# Patient Record
Sex: Male | Born: 1935 | Race: Black or African American | Hispanic: No | Marital: Married | Smoking: Former smoker
Health system: Southern US, Community
[De-identification: ages and names within clinical notes are randomized; demographics above are authoritative.]

## PROBLEM LIST (undated history)

## (undated) DIAGNOSIS — C801 Malignant (primary) neoplasm, unspecified: Secondary | ICD-10-CM

## (undated) DIAGNOSIS — I509 Heart failure, unspecified: Secondary | ICD-10-CM

## (undated) DIAGNOSIS — N289 Disorder of kidney and ureter, unspecified: Secondary | ICD-10-CM

## (undated) DIAGNOSIS — I10 Essential (primary) hypertension: Secondary | ICD-10-CM

## (undated) DIAGNOSIS — J449 Chronic obstructive pulmonary disease, unspecified: Secondary | ICD-10-CM

## (undated) DIAGNOSIS — E119 Type 2 diabetes mellitus without complications: Secondary | ICD-10-CM

## (undated) HISTORY — PX: BACK SURGERY: SHX140

## (undated) HISTORY — PX: TRACHELECTOMY: SHX6586

## (undated) HISTORY — PX: TOTAL LARYNGECTOMY: SHX2543

---

## 2011-07-22 DIAGNOSIS — F17200 Nicotine dependence, unspecified, uncomplicated: Secondary | ICD-10-CM | POA: Insufficient documentation

## 2011-12-14 DIAGNOSIS — G4752 REM sleep behavior disorder: Secondary | ICD-10-CM | POA: Insufficient documentation

## 2011-12-14 DIAGNOSIS — G4730 Sleep apnea, unspecified: Secondary | ICD-10-CM | POA: Insufficient documentation

## 2013-02-10 ENCOUNTER — Emergency Department: Payer: Self-pay | Admitting: Internal Medicine

## 2014-11-28 DIAGNOSIS — J3800 Paralysis of vocal cords and larynx, unspecified: Secondary | ICD-10-CM | POA: Insufficient documentation

## 2014-11-28 DIAGNOSIS — J392 Other diseases of pharynx: Secondary | ICD-10-CM | POA: Insufficient documentation

## 2014-12-10 DIAGNOSIS — C12 Malignant neoplasm of pyriform sinus: Secondary | ICD-10-CM | POA: Insufficient documentation

## 2015-01-07 DIAGNOSIS — D62 Acute posthemorrhagic anemia: Secondary | ICD-10-CM | POA: Insufficient documentation

## 2015-02-12 DIAGNOSIS — Z8639 Personal history of other endocrine, nutritional and metabolic disease: Secondary | ICD-10-CM | POA: Insufficient documentation

## 2015-10-29 DIAGNOSIS — C139 Malignant neoplasm of hypopharynx, unspecified: Secondary | ICD-10-CM | POA: Insufficient documentation

## 2016-02-09 DIAGNOSIS — I2510 Atherosclerotic heart disease of native coronary artery without angina pectoris: Secondary | ICD-10-CM | POA: Insufficient documentation

## 2016-02-09 DIAGNOSIS — I5030 Unspecified diastolic (congestive) heart failure: Secondary | ICD-10-CM | POA: Insufficient documentation

## 2016-04-05 DIAGNOSIS — E8770 Fluid overload, unspecified: Secondary | ICD-10-CM | POA: Insufficient documentation

## 2016-04-05 DIAGNOSIS — N184 Chronic kidney disease, stage 4 (severe): Secondary | ICD-10-CM | POA: Insufficient documentation

## 2016-04-05 DIAGNOSIS — N2581 Secondary hyperparathyroidism of renal origin: Secondary | ICD-10-CM | POA: Insufficient documentation

## 2016-06-28 DIAGNOSIS — E559 Vitamin D deficiency, unspecified: Secondary | ICD-10-CM | POA: Insufficient documentation

## 2016-11-23 DIAGNOSIS — M545 Low back pain, unspecified: Secondary | ICD-10-CM | POA: Insufficient documentation

## 2017-03-09 DIAGNOSIS — C770 Secondary and unspecified malignant neoplasm of lymph nodes of head, face and neck: Secondary | ICD-10-CM | POA: Insufficient documentation

## 2017-03-13 DIAGNOSIS — I214 Non-ST elevation (NSTEMI) myocardial infarction: Secondary | ICD-10-CM | POA: Insufficient documentation

## 2017-03-14 DIAGNOSIS — Z9889 Other specified postprocedural states: Secondary | ICD-10-CM | POA: Insufficient documentation

## 2017-04-17 DIAGNOSIS — D638 Anemia in other chronic diseases classified elsewhere: Secondary | ICD-10-CM | POA: Insufficient documentation

## 2017-04-24 ENCOUNTER — Other Ambulatory Visit: Payer: Self-pay | Admitting: *Deleted

## 2017-05-12 ENCOUNTER — Other Ambulatory Visit: Payer: Self-pay | Admitting: Cardiology

## 2017-05-25 ENCOUNTER — Encounter: Payer: Self-pay | Admitting: *Deleted

## 2017-05-25 ENCOUNTER — Encounter: Payer: Medicare Other | Attending: Cardiology | Admitting: *Deleted

## 2017-05-25 VITALS — Ht 66.0 in | Wt 151.0 lb

## 2017-05-25 DIAGNOSIS — K5790 Diverticulosis of intestine, part unspecified, without perforation or abscess without bleeding: Secondary | ICD-10-CM | POA: Insufficient documentation

## 2017-05-25 DIAGNOSIS — I214 Non-ST elevation (NSTEMI) myocardial infarction: Secondary | ICD-10-CM | POA: Insufficient documentation

## 2017-05-25 DIAGNOSIS — E119 Type 2 diabetes mellitus without complications: Secondary | ICD-10-CM | POA: Insufficient documentation

## 2017-05-25 DIAGNOSIS — I10 Essential (primary) hypertension: Secondary | ICD-10-CM | POA: Insufficient documentation

## 2017-05-25 DIAGNOSIS — E785 Hyperlipidemia, unspecified: Secondary | ICD-10-CM | POA: Insufficient documentation

## 2017-05-25 NOTE — Progress Notes (Signed)
Cardiac Individual Treatment Plan  Patient Details  Name: Kerry [name] MRN: [id number] Date of Birth: May 02, 1934 Referring Provider:     Cardiac Rehab from 05/25/2017 in [facility] Cardiac and Pulmonary Rehab  Referring Provider  Fath      Initial Encounter Date:    Cardiac Rehab from 05/25/2017 in [facility] Cardiac and Pulmonary Rehab  Date  05/25/17  Referring Provider  Fath      Visit Diagnosis: NSTEMI (non-ST elevated myocardial infarction) [facility])  Patient's Home Medications on Admission:  Current Outpatient Medications:  .  amLODipine (NORVASC) 10 MG tablet, , Disp: , Rfl:  .  aspirin 81 MG chewable tablet, Chew by mouth., Disp: , Rfl:  .  atorvastatin (LIPITOR) 80 MG tablet, , Disp: , Rfl:  .  Cholecalciferol (VITAMIN D3) 1000 units CAPS, Take by mouth., Disp: , Rfl:  .  clopidogrel (PLAVIX) 75 MG tablet, , Disp: , Rfl:  .  GLIPIZIDE XL 10 MG 24 hr tablet, , Disp: , Rfl:  .  glucagon (GLUCAGON EMERGENCY) 1 MG injection, as needed. Reported on 06/02/2015, Disp: , Rfl:  .  glucose blood (PRECISION QID TEST) test strip, Accu check Aviva strips, use twice daily, Disp: , Rfl:  .  hydrALAZINE (APRESOLINE) 10 MG tablet, , Disp: , Rfl:  .  isosorbide mononitrate (IMDUR) 30 MG 24 hr tablet, , Disp: , Rfl:  .  metoprolol succinate (TOPROL-XL) 100 MG 24 hr tablet, , Disp: , Rfl:  .  mirtazapine (REMERON) 7.5 MG tablet, , Disp: , Rfl:  .  nitroGLYCERIN (NITROSTAT) 0.3 MG SL tablet, , Disp: , Rfl:  .  torsemide (DEMADEX) 20 MG tablet, , Disp: , Rfl:   Past Medical History: History reviewed. No pertinent past medical history.  Tobacco Use: Social History   Tobacco Use  Smoking Status Former Smoker  . Packs/day: 0.50  . Years: 20.00  . Pack years: 10.00  . Types: Cigarettes  . Last attempt to quit: 12/05/2014  . Years since quitting: 2.4  Smokeless Tobacco Never Used  Tobacco Comment   Quit 12/2014    Labs: Recent Review Flowsheet Data    There is no flowsheet data to display.        Exercise Target Goals: Date: 05/25/17  Exercise Program Goal: Individual exercise prescription set with THRR, safety & activity barriers. Participant demonstrates ability to understand and report RPE using BORG scale, to self-measure pulse accurately, and to acknowledge the importance of the exercise prescription.  Exercise Prescription Goal: Starting with aerobic activity 30 plus minutes a day, 3 days per week for initial exercise prescription. Provide home exercise prescription and guidelines that participant acknowledges understanding prior to discharge.  Activity Barriers & Risk Stratification: Activity Barriers & Cardiac Risk Stratification - 05/25/17 1331      Activity Barriers & Cardiac Risk Stratification   Activity Barriers  Other (comment);Shortness of Breath    Comments  Patient has a chronic Trach     Cardiac Risk Stratification  High       6 Minute Walk: 6 Minute Walk    Row Name 05/25/17 1353         6 Minute Walk   Distance  1030 feet     Walk Time  6 minutes     # of Rest Breaks  0     MPH  1.95     METS  1.91     RPE  11     Perceived Dyspnea   1     VO2  Peak  6.68     Resting HR  74 bpm     Resting BP  108/60     Resting Oxygen Saturation   94 %     Exercise Oxygen Saturation  during 6 min walk  95 %     Max Ex. HR  106 bpm     Max Ex. BP  114/58     2 Minute Post BP  102/58        Oxygen Initial Assessment:   Oxygen Re-Evaluation:   Oxygen Discharge (Final Oxygen Re-Evaluation):   Initial Exercise Prescription: Initial Exercise Prescription - 05/25/17 1300      Date of Initial Exercise RX and Referring Provider   Date  05/25/17    Referring Provider  Fath      Treadmill   MPH  1.8    Grade  0    Minutes  15    METs  2.38      NuStep   Level  2    SPM  80    Minutes  15    METs  2      Recumbant Elliptical   Level  1    RPM  50    Minutes  15    METs  2      REL-XR   Level  3    Speed  50    Minutes  15     METs  2      Prescription Details   Frequency (times per week)  3    Duration  Progress to 45 minutes of aerobic exercise without signs/symptoms of physical distress      Intensity   THRR 40-80% of Max Heartrate  100-126    Ratings of Perceived Exertion  11-13    Perceived Dyspnea  0-4      Resistance Training   Training Prescription  Yes    Weight  3 lb    Reps  10-15       Perform Capillary Blood Glucose checks as needed.  Exercise Prescription Changes: Exercise Prescription Changes    Row Name 05/25/17 1300             Response to Exercise   Blood Pressure (Admit)  108/60       Blood Pressure (Exercise)  114/58       Blood Pressure (Exit)  102/58       Heart Rate (Admit)  86 bpm       Heart Rate (Exercise)  106 bpm       Heart Rate (Exit)  82 bpm       Oxygen Saturation (Admit)  94 %       Rating of Perceived Exertion (Exercise)  11          Exercise Comments:   Exercise Goals and Review: Exercise Goals    Row Name 05/25/17 1352             Exercise Goals   Increase Physical Activity  Yes       Intervention  Provide advice, education, support and counseling about physical activity/exercise needs.;Develop an individualized exercise prescription for aerobic and resistive training based on initial evaluation findings, risk stratification, comorbidities and participant's personal goals.       Expected Outcomes  Achievement of increased cardiorespiratory fitness and enhanced flexibility, muscular endurance and strength shown through measurements of functional capacity and personal statement of participant.       Increase Strength and Stamina  Yes       Intervention  Provide advice, education, support and counseling about physical activity/exercise needs.;Develop an individualized exercise prescription for aerobic and resistive training based on initial evaluation findings, risk stratification, comorbidities and participant's personal goals.       Expected  Outcomes  Achievement of increased cardiorespiratory fitness and enhanced flexibility, muscular endurance and strength shown through measurements of functional capacity and personal statement of participant.       Able to understand and use rate of perceived exertion (RPE) scale  Yes       Intervention  Provide education and explanation on how to use RPE scale       Expected Outcomes  Short Term: Able to use RPE daily in rehab to express subjective intensity level;Long Term:  Able to use RPE to guide intensity level when exercising independently       Able to understand and use Dyspnea scale  Yes       Intervention  Provide education and explanation on how to use Dyspnea scale       Expected Outcomes  Short Term: Able to use Dyspnea scale daily in rehab to express subjective sense of shortness of breath during exertion;Long Term: Able to use Dyspnea scale to guide intensity level when exercising independently       Knowledge and understanding of Target Heart Rate Range (THRR)  Yes       Intervention  Provide education and explanation of THRR including how the numbers were predicted and where they are located for reference       Expected Outcomes  Short Term: Able to state/look up THRR;Long Term: Able to use THRR to govern intensity when exercising independently;Short Term: Able to use daily as guideline for intensity in rehab       Able to check pulse independently  Yes       Intervention  Provide education and demonstration on how to check pulse in carotid and radial arteries.;Review the importance of being able to check your own pulse for safety during independent exercise       Expected Outcomes  Short Term: Able to explain why pulse checking is important during independent exercise;Long Term: Able to check pulse independently and accurately       Understanding of Exercise Prescription  Yes       Intervention  Provide education, explanation, and written materials on patient's individual exercise  prescription       Expected Outcomes  Short Term: Able to explain program exercise prescription;Long Term: Able to explain home exercise prescription to exercise independently          Exercise Goals Re-Evaluation :   Discharge Exercise Prescription (Final Exercise Prescription Changes): Exercise Prescription Changes - 05/25/17 1300      Response to Exercise   Blood Pressure (Admit)  108/60    Blood Pressure (Exercise)  114/58    Blood Pressure (Exit)  102/58    Heart Rate (Admit)  86 bpm    Heart Rate (Exercise)  106 bpm    Heart Rate (Exit)  82 bpm    Oxygen Saturation (Admit)  94 %    Rating of Perceived Exertion (Exercise)  11       Nutrition:  Target Goals: Understanding of nutrition guidelines, daily intake of sodium 1500mg , cholesterol 200mg , calories 30% from fat and 7% or less from saturated fats, daily to have 5 or more servings of fruits and vegetables.  Biometrics: Pre Biometrics - 05/25/17 1351  Pre Biometrics   Height  5\' 6"  (1.676 m)    Weight  151 lb (68.5 kg)    Waist Circumference  36 inches    Hip Circumference  37 inches    Waist to Hip Ratio  0.97 %    BMI (Calculated)  24.38    Single Leg Stand  9.35 seconds        Nutrition Therapy Plan and Nutrition Goals: Nutrition Therapy & Goals - 05/25/17 1321      Intervention Plan   Intervention  Prescribe, educate and counsel regarding individualized specific dietary modifications aiming towards targeted core components such as weight, hypertension, lipid management, diabetes, heart failure and other comorbidities.;Nutrition handout(s) given to patient.    Expected Outcomes  Short Term Goal: Understand basic principles of dietary content, such as calories, fat, sodium, cholesterol and nutrients.;Short Term Goal: A plan has been developed with personal nutrition goals set during dietitian appointment.;Long Term Goal: Adherence to prescribed nutrition plan.       Nutrition Discharge: Rate Your Plate  Scores: Nutrition Assessments - 05/25/17 1323      MEDFICTS Scores   Pre Score  -- paperwork sent home with Trilby Drummer and he is to return it the first day of class       Nutrition Goals Re-Evaluation:   Nutrition Goals Discharge (Final Nutrition Goals Re-Evaluation):   Psychosocial: Target Goals: Acknowledge presence or absence of significant depression and/or stress, maximize coping skills, provide positive support system. Participant is able to verbalize types and ability to use techniques and skills needed for reducing stress and depression.   Initial Review & Psychosocial Screening: Initial Psych Review & Screening - 05/25/17 1323      Initial Review   Current issues with  Current Anxiety/Panic;Current Sleep Concerns;Current Stress Concerns    Source of Stress Concerns  Chronic Illness;Unable to participate in former interests or hobbies    Comments  e has had a rough couple of years health wise. From cancer and requiring a trach to a NSTEMI and Heart Failure. Sohil is stressed about the future and what else could go wrong. He may need dialysis some day. Palliative has recently gotten involved in his care and prescribed sleeping medicine that helps sometimes per [facility].       Family Dynamics   Good Support System?  Yes Wife and Doristine Bosworth      Barriers   Psychosocial barriers to participate in program  The patient should benefit from training in stress management and relaxation.      Screening Interventions   Interventions  Yes;Encouraged to exercise;Program counselor consult    Expected Outcomes  Short Term goal: Utilizing psychosocial counselor, staff and physician to assist with identification of specific Stressors or current issues interfering with healing process. Setting desired goal for each stressor or current issue identified.;Long Term Goal: Stressors or current issues are controlled or eliminated.;Short Term goal: Identification and review with participant of any Quality of  Life or Depression concerns found by scoring the questionnaire.;Long Term goal: The participant improves quality of Life and PHQ9 Scores as seen by post scores and/or verbalization of changes       Quality of Life Scores:  Quality of Life - 05/25/17 1327      Quality of Life Scores   Health/Function Pre  21 %    Socioeconomic Pre  21 %    Psych/Spiritual Pre  20.86 %    Family Pre  21 %    GLOBAL Pre  20.97 %  PHQ-9: Recent Review Flowsheet Data    Depression screen [facility] 2/9 05/25/2017   Decreased Interest 1   Down, Depressed, Hopeless 0   PHQ - 2 Score 1   Altered sleeping 0   Tired, decreased energy 3   Change in appetite 2   Feeling bad or failure about yourself  0   Trouble concentrating 0   Moving slowly or fidgety/restless 0   Suicidal thoughts 0   PHQ-9 Score 6   Difficult doing work/chores Somewhat difficult     Interpretation of Total Score  Total Score Depression Severity:  1-4 = Minimal depression, 5-9 = Mild depression, 10-14 = Moderate depression, 15-19 = Moderately severe depression, 20-27 = Severe depression   Psychosocial Evaluation and Intervention:   Psychosocial Re-Evaluation:   Psychosocial Discharge (Final Psychosocial Re-Evaluation):   Vocational Rehabilitation: Provide vocational rehab assistance to qualifying candidates.   Vocational Rehab Evaluation & Intervention: Vocational Rehab - 05/25/17 1328      Initial Vocational Rehab Evaluation & Intervention   Assessment shows need for Vocational Rehabilitation  No       Education: Education Goals: Education classes will be provided on a variety of topics geared toward better understanding of heart health and risk factor modification. Participant will state understanding/return demonstration of topics presented as noted by education test scores.  Learning Barriers/Preferences: Learning Barriers/Preferences - 05/25/17 1328      Learning Barriers/Preferences   Learning Barriers   None    Learning Preferences  None       Education Topics: General Nutrition Guidelines/Fats and Fiber: -Group instruction provided by verbal, written material, models and posters to present the general guidelines for heart healthy nutrition. Gives an explanation and review of dietary fats and fiber.   Controlling Sodium/Reading Food Labels: -Group verbal and written material supporting the discussion of sodium use in heart healthy nutrition. Review and explanation with models, verbal and written materials for utilization of the food label.   Exercise Physiology & Risk Factors: - Group verbal and written instruction with models to review the exercise physiology of the cardiovascular system and associated critical values. Details cardiovascular disease risk factors and the goals associated with each risk factor.   Aerobic Exercise & Resistance Training: - Gives group verbal and written discussion on the health impact of inactivity. On the components of aerobic and resistive training programs and the benefits of this training and how to safely progress through these programs.   Flexibility, Balance, General Exercise Guidelines: - Provides group verbal and written instruction on the benefits of flexibility and balance training programs. Provides general exercise guidelines with specific guidelines to those with heart or lung disease. Demonstration and skill practice provided.   Stress Management: - Provides group verbal and written instruction about the health risks of elevated stress, cause of high stress, and healthy ways to reduce stress.   Depression: - Provides group verbal and written instruction on the correlation between heart/lung disease and depressed mood, treatment options, and the stigmas associated with seeking treatment.   Anatomy & Physiology of the Heart: - Group verbal and written instruction and models provide basic cardiac anatomy and physiology, with the coronary  electrical and arterial systems. Review of: AMI, Angina, Valve disease, Heart Failure, Cardiac Arrhythmia, Pacemakers, and the ICD.   Cardiac Procedures: - Group verbal and written instruction to review commonly prescribed medications for heart disease. Reviews the medication, class of the drug, and side effects. Includes the steps to properly store meds and maintain the prescription regimen. (  beta blockers and nitrates)   Cardiac Medications I: - Group verbal and written instruction to review commonly prescribed medications for heart disease. Reviews the medication, class of the drug, and side effects. Includes the steps to properly store meds and maintain the prescription regimen.   Cardiac Medications II: -Group verbal and written instruction to review commonly prescribed medications for heart disease. Reviews the medication, class of the drug, and side effects. (all other drug classes)    Go Sex-Intimacy & Heart Disease, Get SMART - Goal Setting: - Group verbal and written instruction through game format to discuss heart disease and the return to sexual intimacy. Provides group verbal and written material to discuss and apply goal setting through the application of the S.M.A.R.T. Method.   Other Matters of the Heart: - Provides group verbal, written materials and models to describe Heart Failure, Angina, Valve Disease, Peripheral Artery Disease, and Diabetes in the realm of heart disease. Includes description of the disease process and treatment options available to the cardiac patient.   Exercise & Equipment Safety: - Individual verbal instruction and demonstration of equipment use and safety with use of the equipment.   Cardiac Rehab from 05/25/2017 in [facility] Cardiac and Pulmonary Rehab  Date  05/25/17  Educator  [facility]  Instruction Review Code  1- Verbalizes Understanding      Infection Prevention: - Provides verbal and written material to individual with discussion of infection  control including proper hand washing and proper equipment cleaning during exercise session.   Cardiac Rehab from 05/25/2017 in [facility] Cardiac and Pulmonary Rehab  Date  05/25/17  Educator  [facility]  Instruction Review Code  1- Verbalizes Understanding      Falls Prevention: - Provides verbal and written material to individual with discussion of falls prevention and safety.   Cardiac Rehab from 05/25/2017 in [facility] Cardiac and Pulmonary Rehab  Date  05/25/17  Educator  [facility]  Instruction Review Code  1- Verbalizes Understanding      Diabetes: - Individual verbal and written instruction to review signs/symptoms of diabetes, desired ranges of glucose level fasting, after meals and with exercise. Acknowledge that pre and post exercise glucose checks will be done for 3 sessions at entry of program.   Cardiac Rehab from 05/25/2017 in [facility] Cardiac and Pulmonary Rehab  Date  05/25/17  Educator  [facility]  Instruction Review Code  1- Verbalizes Understanding      Other: -Provides group and verbal instruction on various topics (see comments)    Knowledge Questionnaire Score: Knowledge Questionnaire Score - 05/25/17 1328      Knowledge Questionnaire Score   Pre Score  22/28 Correct answers reviewed with Trilby Drummer        Core Components/Risk Factors/Patient Goals at Admission: Personal Goals and Risk Factors at Admission - 05/25/17 1317      Core Components/Risk Factors/Patient Goals on Admission    Weight Management  Yes;Weight Maintenance    Intervention  Weight Management: Develop a combined nutrition and exercise program designed to reach desired caloric intake, while maintaining appropriate intake of nutrient and fiber, sodium and fats, and appropriate energy expenditure required for the weight goal.;Weight Management: Provide education and appropriate resources to help participant work on and attain dietary goals.    Admit Weight  147 lb (66.7 kg) Stays between 145-147 lb at home and wishes to  continue to stay there    Goal Weight: Short Term  145 lb (65.8 kg)    Goal Weight: Long Term  145 lb (65.8  kg)    Expected Outcomes  Short Term: Continue to assess and modify interventions until short term weight is achieved;Long Term: Adherence to nutrition and physical activity/exercise program aimed toward attainment of established weight goal;Weight Maintenance: Understanding of the daily nutrition guidelines, which includes 25-35% calories from fat, 7% or less cal from saturated fats, less than 200mg  cholesterol, less than 1.5gm of sodium, & 5 or more servings of fruits and vegetables daily;Understanding recommendations for meals to include 15-35% energy as protein, 25-35% energy from fat, 35-60% energy from carbohydrates, less than 200mg  of dietary cholesterol, 20-35 gm of total fiber daily;Understanding of distribution of calorie intake throughout the day with the consumption of 4-5 meals/snacks    Diabetes  Yes    Intervention  Provide education about signs/symptoms and action to take for hypo/hyperglycemia.;Provide education about proper nutrition, including hydration, and aerobic/resistive exercise prescription along with prescribed medications to achieve blood glucose in normal ranges: Fasting glucose 65-99 mg/dL    Expected Outcomes  Short Term: Participant verbalizes understanding of the signs/symptoms and immediate care of hyper/hypoglycemia, proper foot care and importance of medication, aerobic/resistive exercise and nutrition plan for blood glucose control.;Long Term: Attainment of HbA1C < 7%.    Heart Failure  Yes    Intervention  Provide a combined exercise and nutrition program that is supplemented with education, support and counseling about heart failure. Directed toward relieving symptoms such as shortness of breath, decreased exercise tolerance, and extremity edema.    Expected Outcomes  Improve functional capacity of life;Short term: Attendance in program 2-3 days a week with  increased exercise capacity. Reported lower sodium intake. Reported increased fruit and vegetable intake. Reports medication compliance.;Short term: Daily weights obtained and reported for increase. Utilizing diuretic protocols set by physician.;Long term: Adoption of self-care skills and reduction of barriers for early signs and symptoms recognition and intervention leading to self-care maintenance.    Hypertension  Yes    Intervention  Provide education on lifestyle modifcations including regular physical activity/exercise, weight management, moderate sodium restriction and increased consumption of fresh fruit, vegetables, and low fat dairy, alcohol moderation, and smoking cessation.;Monitor prescription use compliance.    Expected Outcomes  Short Term: Continued assessment and intervention until BP is < 140/12mm HG in hypertensive participants. < 130/83mm HG in hypertensive participants with diabetes, heart failure or chronic kidney disease.;Long Term: Maintenance of blood pressure at goal levels.    Lipids  Yes    Intervention  Provide education and support for participant on nutrition & aerobic/resistive exercise along with prescribed medications to achieve LDL 70mg , HDL >40mg .    Expected Outcomes  Short Term: Participant states understanding of desired cholesterol values and is compliant with medications prescribed. Participant is following exercise prescription and nutrition guidelines.;Long Term: Cholesterol controlled with medications as prescribed, with individualized exercise RX and with personalized nutrition plan. Value goals: LDL < 70mg , HDL > 40 mg.    Stress  Yes He has had a rough couple of years health wise. From cancer and requiring a trach to a NSTEMI and Heart Failure. Lan is stressed about the future and what else could go wrong.     Intervention  Offer individual and/or small group education and counseling on adjustment to heart disease, stress management and health-related  lifestyle change. Teach and support self-help strategies.;Refer participants experiencing significant psychosocial distress to appropriate mental health specialists for further evaluation and treatment. When possible, include family members and significant others in education/counseling sessions.    Expected Outcomes  Short Term:  Participant demonstrates changes in health-related behavior, relaxation and other stress management skills, ability to obtain effective social support, and compliance with psychotropic medications if prescribed.;Long Term: Emotional wellbeing is indicated by absence of clinically significant psychosocial distress or social isolation.       Core Components/Risk Factors/Patient Goals Review:    Core Components/Risk Factors/Patient Goals at Discharge (Final Review):    ITP Comments: ITP Comments    Row Name 05/25/17 1302           ITP Comments  Med review completed. Initial ITP created. Diagnosis can be found in [facility] 03/13/17          Comments: Initial ITP

## 2017-05-25 NOTE — Progress Notes (Signed)
Daily Session Note  Patient Details  Name: Kerry [name] MRN: [id number] Date of Birth: [birth year] Referring Provider:     Cardiac Rehab from 05/25/2017 in [facility] Cardiac and Pulmonary Rehab  Referring Provider  Fath      Encounter Date: 05/25/2017  Check In: Session Check In - 05/25/17 1302      Check-In   Location  [facility]-Cardiac & Pulmonary Rehab    Staff Present  Renita Papa, RN Vickki Hearing, [place], ACSM CEP, Exercise Physiologist    Supervising physician immediately available to respond to emergencies  See telemetry face sheet for immediately available ER MD    Medication changes reported      No    Fall or balance concerns reported     No    Tobacco Cessation  No Change quit in 2016    Warm-up and Cool-down  Performed as group-led instruction    Resistance Training Performed  Yes    VAD Patient?  No      Pain Assessment   Currently in Pain?  No/denies        Exercise Prescription Changes - 05/25/17 1300      Response to Exercise   Blood Pressure (Admit)  108/60    Blood Pressure (Exercise)  114/58    Blood Pressure (Exit)  102/58    Heart Rate (Admit)  86 bpm    Heart Rate (Exercise)  106 bpm    Heart Rate (Exit)  82 bpm    Oxygen Saturation (Admit)  94 %    Rating of Perceived Exertion (Exercise)  11       Social History   Tobacco Use  Smoking Status Former Smoker  . Packs/day: 0.50  . Years: 20.00  . Pack years: 10.00  . Types: Cigarettes  . Last attempt to quit: 12/05/2014  . Years since quitting: 2.4  Smokeless Tobacco Never Used  Tobacco Comment   Quit 12/2014    Goals Met:  Proper associated with RPD/PD & O2 Sat Exercise tolerated well Personal goals reviewed No report of cardiac concerns or symptoms Strength training completed today  Goals Unmet:  Not Applicable  Comments: Med Review completed    Dr. Emily Filbert is Medical Director for [facility] and [facility].

## 2017-05-25 NOTE — Patient Instructions (Signed)
Patient Instructions  Patient Details  Name: Kerry [name] MRN: [id number] Date of Birth: August 06, 1933 Referring Provider:  Teodoro Spray, MD  Below are the personal goals you chose as well as exercise and nutrition goals. Our goal is to help you keep on track towards obtaining and maintaining your goals. We will be discussing your progress on these goals with you throughout the program.  Initial Exercise Prescription: Initial Exercise Prescription - 05/25/17 1300      Date of Initial Exercise RX and Referring Provider   Date  05/25/17    Referring Provider  Fath      Treadmill   MPH  1.8    Grade  0    Minutes  15    METs  2.38      NuStep   Level  2    SPM  80    Minutes  15    METs  2      Recumbant Elliptical   Level  1    RPM  50    Minutes  15    METs  2      REL-XR   Level  3    Speed  50    Minutes  15    METs  2      Prescription Details   Frequency (times per week)  3    Duration  Progress to 45 minutes of aerobic exercise without signs/symptoms of physical distress      Intensity   THRR 40-80% of Max Heartrate  100-126    Ratings of Perceived Exertion  11-13    Perceived Dyspnea  0-4      Resistance Training   Training Prescription  Yes    Weight  3 lb    Reps  10-15       Exercise Goals: Frequency: Be able to perform aerobic exercise three times per week working toward 3-5 days per week.  Intensity: Work with a perceived exertion of 11 (fairly light) - 15 (hard) as tolerated. Follow your new exercise prescription and watch for changes in prescription as you progress with the program. Changes will be reviewed with you when they are made.  Duration: You should be able to do 30 minutes of continuous aerobic exercise in addition to a 5 minute warm-up and a 5 minute cool-down routine.  Nutrition Goals: Your personal nutrition goals will be established when you do your nutrition analysis with the dietician.  The following are nutrition guidelines  to follow: Cholesterol < 200mg /day Sodium < 1500mg /day Fiber: Men over 50 yrs - 30 grams per day  Personal Goals: Personal Goals and Risk Factors at Admission - 05/25/17 1317      Core Components/Risk Factors/Patient Goals on Admission    Weight Management  Yes;Weight Maintenance    Intervention  Weight Management: Develop a combined nutrition and exercise program designed to reach desired caloric intake, while maintaining appropriate intake of nutrient and fiber, sodium and fats, and appropriate energy expenditure required for the weight goal.;Weight Management: Provide education and appropriate resources to help participant work on and attain dietary goals.    Admit Weight  147 lb (66.7 kg) Stays between 145-147 lb at home and wishes to continue to stay there    Goal Weight: Short Term  145 lb (65.8 kg)    Goal Weight: Long Term  145 lb (65.8 kg)    Expected Outcomes  Short Term: Continue to assess and modify interventions until short term weight is achieved;Long  Term: Adherence to nutrition and physical activity/exercise program aimed toward attainment of established weight goal;Weight Maintenance: Understanding of the daily nutrition guidelines, which includes 25-35% calories from fat, 7% or less cal from saturated fats, less than 200mg  cholesterol, less than 1.5gm of sodium, & 5 or more servings of fruits and vegetables daily;Understanding recommendations for meals to include 15-35% energy as protein, 25-35% energy from fat, 35-60% energy from carbohydrates, less than 200mg  of dietary cholesterol, 20-35 gm of total fiber daily;Understanding of distribution of calorie intake throughout the day with the consumption of 4-5 meals/snacks    Diabetes  Yes    Intervention  Provide education about signs/symptoms and action to take for hypo/hyperglycemia.;Provide education about proper nutrition, including hydration, and aerobic/resistive exercise prescription along with prescribed medications to achieve  blood glucose in normal ranges: Fasting glucose 65-99 mg/dL    Expected Outcomes  Short Term: Participant verbalizes understanding of the signs/symptoms and immediate care of hyper/hypoglycemia, proper foot care and importance of medication, aerobic/resistive exercise and nutrition plan for blood glucose control.;Long Term: Attainment of HbA1C < 7%.    Heart Failure  Yes    Intervention  Provide a combined exercise and nutrition program that is supplemented with education, support and counseling about heart failure. Directed toward relieving symptoms such as shortness of breath, decreased exercise tolerance, and extremity edema.    Expected Outcomes  Improve functional capacity of life;Short term: Attendance in program 2-3 days a week with increased exercise capacity. Reported lower sodium intake. Reported increased fruit and vegetable intake. Reports medication compliance.;Short term: Daily weights obtained and reported for increase. Utilizing diuretic protocols set by physician.;Long term: Adoption of self-care skills and reduction of barriers for early signs and symptoms recognition and intervention leading to self-care maintenance.    Hypertension  Yes    Intervention  Provide education on lifestyle modifcations including regular physical activity/exercise, weight management, moderate sodium restriction and increased consumption of fresh fruit, vegetables, and low fat dairy, alcohol moderation, and smoking cessation.;Monitor prescription use compliance.    Expected Outcomes  Short Term: Continued assessment and intervention until BP is < 140/41mm HG in hypertensive participants. < 130/47mm HG in hypertensive participants with diabetes, heart failure or chronic kidney disease.;Long Term: Maintenance of blood pressure at goal levels.    Lipids  Yes    Intervention  Provide education and support for participant on nutrition & aerobic/resistive exercise along with prescribed medications to achieve LDL  70mg , HDL >40mg .    Expected Outcomes  Short Term: Participant states understanding of desired cholesterol values and is compliant with medications prescribed. Participant is following exercise prescription and nutrition guidelines.;Long Term: Cholesterol controlled with medications as prescribed, with individualized exercise RX and with personalized nutrition plan. Value goals: LDL < 70mg , HDL > 40 mg.    Stress  Yes He has had a rough couple of years health wise. From cancer and requiring a trach to a NSTEMI and Heart Failure. Kerry [name] is stressed about the future and what else could go wrong.     Intervention  Offer individual and/or small group education and counseling on adjustment to heart disease, stress management and health-related lifestyle change. Teach and support self-help strategies.;Refer participants experiencing significant psychosocial distress to appropriate mental health specialists for further evaluation and treatment. When possible, include family members and significant others in education/counseling sessions.    Expected Outcomes  Short Term: Participant demonstrates changes in health-related behavior, relaxation and other stress management skills, ability to obtain effective social support, and compliance with  psychotropic medications if prescribed.;Long Term: Emotional wellbeing is indicated by absence of clinically significant psychosocial distress or social isolation.       Tobacco Use Initial Evaluation: Social History   Tobacco Use  Smoking Status Former Smoker  . Packs/day: 0.50  . Years: 20.00  . Pack years: 10.00  . Types: Cigarettes  . Last attempt to quit: 12/05/2014  . Years since quitting: 2.4  Smokeless Tobacco Never Used  Tobacco Comment   Quit 12/2014    Exercise Goals and Review: Exercise Goals    Row Name 05/25/17 1352             Exercise Goals   Increase Physical Activity  Yes       Intervention  Provide advice, education, support and  counseling about physical activity/exercise needs.;Develop an individualized exercise prescription for aerobic and resistive training based on initial evaluation findings, risk stratification, comorbidities and participant's personal goals.       Expected Outcomes  Achievement of increased cardiorespiratory fitness and enhanced flexibility, muscular endurance and strength shown through measurements of functional capacity and personal statement of participant.       Increase Strength and Stamina  Yes       Intervention  Provide advice, education, support and counseling about physical activity/exercise needs.;Develop an individualized exercise prescription for aerobic and resistive training based on initial evaluation findings, risk stratification, comorbidities and participant's personal goals.       Expected Outcomes  Achievement of increased cardiorespiratory fitness and enhanced flexibility, muscular endurance and strength shown through measurements of functional capacity and personal statement of participant.       Able to understand and use rate of perceived exertion (RPE) scale  Yes       Intervention  Provide education and explanation on how to use RPE scale       Expected Outcomes  Short Term: Able to use RPE daily in rehab to express subjective intensity level;Long Term:  Able to use RPE to guide intensity level when exercising independently       Able to understand and use Dyspnea scale  Yes       Intervention  Provide education and explanation on how to use Dyspnea scale       Expected Outcomes  Short Term: Able to use Dyspnea scale daily in rehab to express subjective sense of shortness of breath during exertion;Long Term: Able to use Dyspnea scale to guide intensity level when exercising independently       Knowledge and understanding of Target Heart Rate Range (THRR)  Yes       Intervention  Provide education and explanation of THRR including how the numbers were predicted and where they are  located for reference       Expected Outcomes  Short Term: Able to state/look up THRR;Long Term: Able to use THRR to govern intensity when exercising independently;Short Term: Able to use daily as guideline for intensity in rehab       Able to check pulse independently  Yes       Intervention  Provide education and demonstration on how to check pulse in carotid and radial arteries.;Review the importance of being able to check your own pulse for safety during independent exercise       Expected Outcomes  Short Term: Able to explain why pulse checking is important during independent exercise;Long Term: Able to check pulse independently and accurately       Understanding of Exercise Prescription  Yes  Intervention  Provide education, explanation, and written materials on patient's individual exercise prescription       Expected Outcomes  Short Term: Able to explain program exercise prescription;Long Term: Able to explain home exercise prescription to exercise independently          Copy of goals given to participant.

## 2017-05-31 ENCOUNTER — Encounter: Payer: Medicare Other | Admitting: *Deleted

## 2017-05-31 DIAGNOSIS — I214 Non-ST elevation (NSTEMI) myocardial infarction: Secondary | ICD-10-CM | POA: Diagnosis not present

## 2017-05-31 LAB — GLUCOSE, CAPILLARY
Glucose-Capillary: 131 mg/dL — ABNORMAL HIGH (ref 65–99)
Glucose-Capillary: 116 mg/dL — ABNORMAL HIGH (ref 65–99)

## 2017-05-31 NOTE — Progress Notes (Signed)
Daily Session Note  Patient Details  Name: Kerry [name] MRN: [id number] Date of Birth: 30-Sep-1933 Referring Provider:     Cardiac Rehab from 05/25/2017 in [facility] Cardiac and Pulmonary Rehab  Referring Provider  Fath      Encounter Date: 05/31/2017  Check In: Session Check In - 05/31/17 1707      Check-In   Location  [facility]-Cardiac & Pulmonary Rehab    Staff Present  Renita Papa, RN Vickki Hearing, BA, ACSM CEP, Exercise Physiologist;Carroll Enterkin, RN, BSN    Supervising physician immediately available to respond to emergencies  See telemetry face sheet for immediately available ER MD    Medication changes reported      No    Fall or balance concerns reported     No    Warm-up and Cool-down  Performed on first and last piece of equipment    Resistance Training Performed  Yes    VAD Patient?  No      Pain Assessment   Currently in Pain?  No/denies          Social History   Tobacco Use  Smoking Status Former Smoker  . Packs/day: 0.50  . Years: 20.00  . Pack years: 10.00  . Types: Cigarettes  . Last attempt to quit: 12/05/2014  . Years since quitting: 2.4  Smokeless Tobacco Never Used  Tobacco Comment   Quit 12/2014    Goals Met:  Proper associated with RPD/PD & O2 Sat Independence with exercise equipment Exercise tolerated well No report of cardiac concerns or symptoms Strength training completed today  Goals Unmet:  Not Applicable  Comments: First [name] day of exercise!  Patient was oriented to gym and equipment including functions, settings, policies, and procedures.  Patient's individual exercise prescription and treatment plan were reviewed.  All starting workloads were established based on the results of the 6 minute walk test done at initial orientation visit.  The plan for exercise progression was also introduced and progression will be customized based on patient's performance and goals.    Dr. Emily Filbert is Medical Director for [place] and [facility].

## 2017-06-01 DIAGNOSIS — I214 Non-ST elevation (NSTEMI) myocardial infarction: Secondary | ICD-10-CM | POA: Diagnosis not present

## 2017-06-01 LAB — GLUCOSE, CAPILLARY
Glucose-Capillary: 159 mg/dL — ABNORMAL HIGH (ref 65–99)
Glucose-Capillary: 159 mg/dL — ABNORMAL HIGH (ref 65–99)

## 2017-06-01 NOTE — Progress Notes (Signed)
Daily Session Note  Patient Details  Name: Kerry [name] MRN: [id number] Date of Birth: Mar 18, 1934 Referring Provider:     Cardiac Rehab from 05/25/2017 in [facility] Cardiac and Pulmonary Rehab  Referring Provider  Fath      Encounter Date: 06/01/2017  Check In: Session Check In - 06/01/17 1617      Check-In   Location  [facility]-Cardiac & Pulmonary Rehab    Staff Present  Justin Mend RCP,RRT,BSRT;Meredith Sherryll Burger, RN BSN;Laureen Janell Quiet, RRT, Respiratory Therapist    Supervising physician immediately available to respond to emergencies  See telemetry face sheet for immediately available ER MD    Medication changes reported      No    Fall or balance concerns reported     No    Warm-up and Cool-down  Performed on first and last piece of equipment    Resistance Training Performed  Yes    VAD Patient?  No      Pain Assessment   Currently in Pain?  No/denies          Social History   Tobacco Use  Smoking Status Former Smoker  . Packs/day: 0.50  . Years: 20.00  . Pack years: 10.00  . Types: Cigarettes  . Last attempt to quit: 12/05/2014  . Years since quitting: 2.4  Smokeless Tobacco Never Used  Tobacco Comment   Quit 12/2014    Goals Met:  Independence with exercise equipment Exercise tolerated well No report of cardiac concerns or symptoms Strength training completed today  Goals Unmet:  Not Applicable  Comments: Pt able to follow exercise prescription today without complaint.  Will continue to monitor for progression.   Dr. Emily Filbert is Medical Director for [place] and [facility].

## 2017-06-07 ENCOUNTER — Encounter: Payer: Self-pay | Admitting: *Deleted

## 2017-06-07 ENCOUNTER — Encounter: Payer: Medicare Other | Attending: Cardiology | Admitting: *Deleted

## 2017-06-07 DIAGNOSIS — I214 Non-ST elevation (NSTEMI) myocardial infarction: Secondary | ICD-10-CM

## 2017-06-07 NOTE — Progress Notes (Signed)
Daily Session Note  Patient Details  Name: Kerry [name] MRN: [id number] Date of Birth: [birth year] Referring Provider:     Cardiac Rehab from 05/25/2017 in [facility] Cardiac and Pulmonary Rehab  Referring Provider  Fath      Encounter Date: 06/07/2017  Check In: Session Check In - 06/07/17 1651      Check-In   Location  [facility]-Cardiac & Pulmonary Rehab    Staff Present  Renita Papa, RN Vickki Hearing, BA, ACSM CEP, Exercise Physiologist;Carroll Enterkin, RN, BSN    Supervising physician immediately available to respond to emergencies  See telemetry face sheet for immediately available ER MD    Medication changes reported      No    Fall or balance concerns reported     No    Warm-up and Cool-down  Performed on first and last piece of equipment    Resistance Training Performed  Yes    VAD Patient?  No      Pain Assessment   Currently in Pain?  No/denies          Social History   Tobacco Use  Smoking Status Former Smoker  . Packs/day: 0.50  . Years: 20.00  . Pack years: 10.00  . Types: Cigarettes  . Last attempt to quit: 12/05/2014  . Years since quitting: 2.5  Smokeless Tobacco Never Used  Tobacco Comment   Quit 12/2014    Goals Met:  Independence with exercise equipment Exercise tolerated well No report of cardiac concerns or symptoms Strength training completed today  Goals Unmet:  Not Applicable  Comments: Pt able to follow exercise prescription today without complaint.  Will continue to monitor for progression.    Dr. Emily Filbert is Medical Director for [place] and [facility].

## 2017-06-07 NOTE — Progress Notes (Signed)
Cardiac Individual Treatment Plan  Patient Details  Name: Kerry [name] MRN: [id number] Date of Birth: Oct 12, 1933 Referring Provider:     Cardiac Rehab from 05/25/2017 in [facility] Cardiac and Pulmonary Rehab  Referring Provider  Fath      Initial Encounter Date:    Cardiac Rehab from 05/25/2017 in [facility] Cardiac and Pulmonary Rehab  Date  05/25/17  Referring Provider  Fath      Visit Diagnosis: NSTEMI (non-ST elevated myocardial infarction) [facility])  Patient's Home Medications on Admission:  Current Outpatient Medications:  .  amLODipine (NORVASC) 10 MG tablet, , Disp: , Rfl:  .  aspirin 81 MG chewable tablet, Chew by mouth., Disp: , Rfl:  .  atorvastatin (LIPITOR) 80 MG tablet, , Disp: , Rfl:  .  Cholecalciferol (VITAMIN D3) 1000 units CAPS, Take by mouth., Disp: , Rfl:  .  clopidogrel (PLAVIX) 75 MG tablet, , Disp: , Rfl:  .  GLIPIZIDE XL 10 MG 24 hr tablet, , Disp: , Rfl:  .  glucagon (GLUCAGON EMERGENCY) 1 MG injection, as needed. Reported on 06/02/2015, Disp: , Rfl:  .  glucose blood (PRECISION QID TEST) test strip, Accu check Aviva strips, use twice daily, Disp: , Rfl:  .  hydrALAZINE (APRESOLINE) 10 MG tablet, , Disp: , Rfl:  .  isosorbide mononitrate (IMDUR) 30 MG 24 hr tablet, , Disp: , Rfl:  .  metoprolol succinate (TOPROL-XL) 100 MG 24 hr tablet, , Disp: , Rfl:  .  mirtazapine (REMERON) 7.5 MG tablet, , Disp: , Rfl:  .  nitroGLYCERIN (NITROSTAT) 0.3 MG SL tablet, , Disp: , Rfl:  .  torsemide (DEMADEX) 20 MG tablet, , Disp: , Rfl:   Past Medical History: No past medical history on file.  Tobacco Use: Social History   Tobacco Use  Smoking Status Former Smoker  . Packs/day: 0.50  . Years: 20.00  . Pack years: 10.00  . Types: Cigarettes  . Last attempt to quit: 12/05/2014  . Years since quitting: 2.5  Smokeless Tobacco Never Used  Tobacco Comment   Quit 12/2014    Labs: Recent Review Flowsheet Data    There is no flowsheet data to display.       Exercise  Target Goals:    Exercise Program Goal: Individual exercise prescription set with THRR, safety & activity barriers. Participant demonstrates ability to understand and report RPE using BORG scale, to self-measure pulse accurately, and to acknowledge the importance of the exercise prescription.  Exercise Prescription Goal: Starting with aerobic activity 30 plus minutes a day, 3 days per week for initial exercise prescription. Provide home exercise prescription and guidelines that participant acknowledges understanding prior to discharge.  Activity Barriers & Risk Stratification: Activity Barriers & Cardiac Risk Stratification - 05/25/17 1331      Activity Barriers & Cardiac Risk Stratification   Activity Barriers  Other (comment);Shortness of Breath    Comments  Patient has a chronic Trach     Cardiac Risk Stratification  High       6 Minute Walk: 6 Minute Walk    Row Name 05/25/17 1353         6 Minute Walk   Distance  1030 feet     Walk Time  6 minutes     # of Rest Breaks  0     MPH  1.95     METS  1.91     RPE  11     Perceived Dyspnea   1     VO2 Peak  6.68     Resting HR  74 bpm     Resting BP  108/60     Resting Oxygen Saturation   94 %     Exercise Oxygen Saturation  during 6 min walk  95 %     Max Ex. HR  106 bpm     Max Ex. BP  114/58     2 Minute Post BP  102/58        Oxygen Initial Assessment:   Oxygen Re-Evaluation:   Oxygen Discharge (Final Oxygen Re-Evaluation):   Initial Exercise Prescription: Initial Exercise Prescription - 05/25/17 1300      Date of Initial Exercise RX and Referring Provider   Date  05/25/17    Referring Provider  Fath      Treadmill   MPH  1.8    Grade  0    Minutes  15    METs  2.38      NuStep   Level  2    SPM  80    Minutes  15    METs  2      Recumbant Elliptical   Level  1    RPM  50    Minutes  15    METs  2      REL-XR   Level  3    Speed  50    Minutes  15    METs  2      Prescription  Details   Frequency (times per week)  3    Duration  Progress to 45 minutes of aerobic exercise without signs/symptoms of physical distress      Intensity   THRR 40-80% of Max Heartrate  100-126    Ratings of Perceived Exertion  11-13    Perceived Dyspnea  0-4      Resistance Training   Training Prescription  Yes    Weight  3 lb    Reps  10-15       Perform Capillary Blood Glucose checks as needed.  Exercise Prescription Changes: Exercise Prescription Changes    Row Name 05/25/17 1300             Response to Exercise   Blood Pressure (Admit)  108/60       Blood Pressure (Exercise)  114/58       Blood Pressure (Exit)  102/58       Heart Rate (Admit)  86 bpm       Heart Rate (Exercise)  106 bpm       Heart Rate (Exit)  82 bpm       Oxygen Saturation (Admit)  94 %       Rating of Perceived Exertion (Exercise)  11          Exercise Comments: Exercise Comments    Row Name 05/31/17 1708           Exercise Comments  First full day of exercise!  Patient was oriented to gym and equipment including functions, settings, policies, and procedures.  Patient's individual exercise prescription and treatment plan were reviewed.  All starting workloads were established based on the results of the 6 minute walk test done at initial orientation visit.  The plan for exercise progression was also introduced and progression will be customized based on patient's performance and goals          Exercise Goals and Review: Exercise Goals    Row Name 05/25/17 1352  Exercise Goals   Increase Physical Activity  Yes       Intervention  Provide advice, education, support and counseling about physical activity/exercise needs.;Develop an individualized exercise prescription for aerobic and resistive training based on initial evaluation findings, risk stratification, comorbidities and participant's personal goals.       Expected Outcomes  Achievement of increased cardiorespiratory  fitness and enhanced flexibility, muscular endurance and strength shown through measurements of functional capacity and personal statement of participant.       Increase Strength and Stamina  Yes       Intervention  Provide advice, education, support and counseling about physical activity/exercise needs.;Develop an individualized exercise prescription for aerobic and resistive training based on initial evaluation findings, risk stratification, comorbidities and participant's personal goals.       Expected Outcomes  Achievement of increased cardiorespiratory fitness and enhanced flexibility, muscular endurance and strength shown through measurements of functional capacity and personal statement of participant.       Able to understand and use rate of perceived exertion (RPE) scale  Yes       Intervention  Provide education and explanation on how to use RPE scale       Expected Outcomes  Short Term: Able to use RPE daily in rehab to express subjective intensity level;Long Term:  Able to use RPE to guide intensity level when exercising independently       Able to understand and use Dyspnea scale  Yes       Intervention  Provide education and explanation on how to use Dyspnea scale       Expected Outcomes  Short Term: Able to use Dyspnea scale daily in rehab to express subjective sense of shortness of breath during exertion;Long Term: Able to use Dyspnea scale to guide intensity level when exercising independently       Knowledge and understanding of Target Heart Rate Range (THRR)  Yes       Intervention  Provide education and explanation of THRR including how the numbers were predicted and where they are located for reference       Expected Outcomes  Short Term: Able to state/look up THRR;Long Term: Able to use THRR to govern intensity when exercising independently;Short Term: Able to use daily as guideline for intensity in rehab       Able to check pulse independently  Yes       Intervention  Provide  education and demonstration on how to check pulse in carotid and radial arteries.;Review the importance of being able to check your own pulse for safety during independent exercise       Expected Outcomes  Short Term: Able to explain why pulse checking is important during independent exercise;Long Term: Able to check pulse independently and accurately       Understanding of Exercise Prescription  Yes       Intervention  Provide education, explanation, and written materials on patient's individual exercise prescription       Expected Outcomes  Short Term: Able to explain program exercise prescription;Long Term: Able to explain home exercise prescription to exercise independently          Exercise Goals Re-Evaluation : Exercise Goals Re-Evaluation    Row Name 05/31/17 1708             Exercise Goal Re-Evaluation   Exercise Goals Review  Able to understand and use rate of perceived exertion (RPE) scale;Knowledge and understanding of Target Heart Rate Range (THRR);Understanding of Exercise Prescription;Increase Strength  and Stamina;Increase Physical Activity       Comments  Reviewed RPE scale, THR and program prescription with pt today.  Pt voiced understanding and was given a copy of goals to take home.        Expected Outcomes  Short: Use RPE daily to regulate intensity.  Long: Follow program prescription in THR.          Discharge Exercise Prescription (Final Exercise Prescription Changes): Exercise Prescription Changes - 05/25/17 1300      Response to Exercise   Blood Pressure (Admit)  108/60    Blood Pressure (Exercise)  114/58    Blood Pressure (Exit)  102/58    Heart Rate (Admit)  86 bpm    Heart Rate (Exercise)  106 bpm    Heart Rate (Exit)  82 bpm    Oxygen Saturation (Admit)  94 %    Rating of Perceived Exertion (Exercise)  11       Nutrition:  Target Goals: Understanding of nutrition guidelines, daily intake of sodium <1584m, cholesterol <2039m calories 30% from fat  and 7% or less from saturated fats, daily to have 5 or more servings of fruits and vegetables.  Biometrics: Pre Biometrics - 05/25/17 1351      Pre Biometrics   Height  5' 6"  (1.676 m)    Weight  151 lb (68.5 kg)    Waist Circumference  36 inches    Hip Circumference  37 inches    Waist to Hip Ratio  0.97 %    BMI (Calculated)  24.38    Single Leg Stand  9.35 seconds        Nutrition Therapy Plan and Nutrition Goals: Nutrition Therapy & Goals - 05/25/17 1321      Intervention Plan   Intervention  Prescribe, educate and counsel regarding individualized specific dietary modifications aiming towards targeted core components such as weight, hypertension, lipid management, diabetes, heart failure and other comorbidities.;Nutrition handout(s) given to patient.    Expected Outcomes  Short Term Goal: Understand basic principles of dietary content, such as calories, fat, sodium, cholesterol and nutrients.;Short Term Goal: A plan has been developed with personal nutrition goals set during dietitian appointment.;Long Term Goal: Adherence to prescribed nutrition plan.       Nutrition Discharge: Rate Your Plate Scores: Nutrition Assessments - 06/01/17 1754      MEDFICTS Scores   Pre Score  41       Nutrition Goals Re-Evaluation:   Nutrition Goals Discharge (Final Nutrition Goals Re-Evaluation):   Psychosocial: Target Goals: Acknowledge presence or absence of significant depression and/or stress, maximize coping skills, provide positive support system. Participant is able to verbalize types and ability to use techniques and skills needed for reducing stress and depression.   Initial Review & Psychosocial Screening: Initial Psych Review & Screening - 05/25/17 1323      Initial Review   Current issues with  Current Anxiety/Panic;Current Sleep Concerns;Current Stress Concerns    Source of Stress Concerns  Chronic Illness;Unable to participate in former interests or hobbies    Comments   e has had a rough couple of years health wise. From cancer and requiring a trach to a NSTEMI and Heart Failure. Kerry [name] stressed about the future and what else could go wrong. He may need dialysis some day. Palliative has recently gotten involved in his care and [facility]      Family Dynamics   Good Support System?  Yes Wife  and Pastor      Barriers   Psychosocial barriers to participate in program  The patient should benefit from training in stress management and relaxation.      Screening Interventions   Interventions  Yes;Encouraged to exercise;Program counselor consult    Expected Outcomes  Short Term goal: Utilizing psychosocial counselor, staff and physician to assist with identification of specific Stressors or current issues interfering with healing process. Setting desired goal for each stressor or current issue identified.;Long Term Goal: Stressors or current issues are controlled or eliminated.;Short Term goal: Identification and review with participant of any Quality of Life or Depression concerns found by scoring the questionnaire.;Long Term goal: The participant improves quality of Life and PHQ9 Scores as seen by post scores and/or verbalization of changes       Quality of Life Scores:  Quality of Life - 05/25/17 1327      Quality of Life Scores   Health/Function Pre  21 %    Socioeconomic Pre  21 %    Psych/Spiritual Pre  20.86 %    Family Pre  21 %    GLOBAL Pre  20.97 %       PHQ-9: Recent Review Flowsheet Data    Depression screen [facility] 2/9 05/25/2017   Decreased Interest 1   Down, Depressed, Hopeless 0   PHQ - 2 Score 1   Altered sleeping 0   Tired, decreased energy 3   Change in appetite 2   Feeling bad or failure about yourself  0   Trouble concentrating 0   Moving slowly or fidgety/restless 0   Suicidal thoughts 0   PHQ-9 Score 6   Difficult doing work/chores Somewhat difficult     Interpretation of  Total Score  Total Score Depression Severity:  1-4 = Minimal depression, 5-9 = Mild depression, 10-14 = Moderate depression, 15-19 = Moderately severe depression, 20-27 = Severe depression   Psychosocial Evaluation and Intervention: Psychosocial Evaluation - 05/31/17 1713      Psychosocial Evaluation & Interventions   Interventions  Encouraged to exercise with the program and follow exercise prescription    Comments  Counselor met with Mr. Soley [facility]) for initial psychosocial evaluation.  He is an 84 year old who had a heart attack approx. 6 weeks ago.  He has a strong support system with a spouse of 63 years and active involvement in his local church.  He also has a large family in [place].  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  He was not that difficult to understand.  He reports sleeping okay with the help of medication; but his appetite has been poor for the past 2-3 months.  Kerry [name] denies a history of depression or anxiety or any current symptoms and states his mood is generally positive most of the time.  He reports his health as his primary stressor currently.  He has goals to get stronger overall while in this program.  staff will follow with Kerry [name].    Expected Outcomes  Kerry [name] will benefit from consistent exercise to achieve his stated goals.  The educational and psychoeducational components of this program will be helpful in learning more and coping more positively with his cardiac condition.  Staff will follow with Kerry [name]    Continue Psychosocial Services   Follow up required by staff       Psychosocial Re-Evaluation:   Psychosocial Discharge (Final Psychosocial Re-Evaluation):   Vocational Rehabilitation: Provide vocational rehab  assistance to qualifying candidates.   Vocational Rehab Evaluation & Intervention: Vocational Rehab - 05/25/17 1328      Initial Vocational Rehab Evaluation & Intervention   Assessment shows need for Vocational  Rehabilitation  No       Education: Education Goals: Education classes will be provided on a variety of topics geared toward better understanding of heart health and risk factor modification. Participant will state understanding/return demonstration of topics presented as noted by education test scores.  Learning Barriers/Preferences: Learning Barriers/Preferences - 05/25/17 1328      Learning Barriers/Preferences   Learning Barriers  None    Learning Preferences  None       Education Topics: General Nutrition Guidelines/Fats and Fiber: -Group instruction provided by verbal, written material, models and posters to present the general guidelines for heart healthy nutrition. Gives an explanation and review of dietary fats and fiber.   Controlling Sodium/Reading Food Labels: -Group verbal and written material supporting the discussion of sodium use in heart healthy nutrition. Review and explanation with models, verbal and written materials for utilization of the food label.   Exercise Physiology & Risk Factors: - Group verbal and written instruction with models to review the exercise physiology of the cardiovascular system and associated critical values. Details cardiovascular disease risk factors and the goals associated with each risk factor.   Cardiac Rehab from 05/31/2017 in [facility] Cardiac and Pulmonary Rehab  Date  05/31/17  Educator  AS  Instruction Review Code  1- Verbalizes Understanding      Aerobic Exercise & Resistance Training: - Gives group verbal and written discussion on the health impact of inactivity. On the components of aerobic and resistive training programs and the benefits of this training and how to safely progress through these programs.   Flexibility, Balance, General Exercise Guidelines: - Provides group verbal and written instruction on the benefits of flexibility and balance training programs. Provides general exercise guidelines with specific guidelines  to those with heart or lung disease. Demonstration and skill practice provided.   Stress Management: - Provides group verbal and written instruction about the health risks of elevated stress, cause of high stress, and healthy ways to reduce stress.   Depression: - Provides group verbal and written instruction on the correlation between heart/lung disease and depressed mood, treatment options, and the stigmas associated with seeking treatment.   Anatomy & Physiology of the Heart: - Group verbal and written instruction and models provide basic cardiac anatomy and physiology, with the coronary electrical and arterial systems. Review of: AMI, Angina, Valve disease, Heart Failure, Cardiac Arrhythmia, Pacemakers, and the ICD.   Cardiac Procedures: - Group verbal and written instruction to review commonly prescribed medications for heart disease. Reviews the medication, class of the drug, and side effects. Includes the steps to properly store meds and maintain the prescription regimen. (beta blockers and nitrates)   Cardiac Medications I: - Group verbal and written instruction to review commonly prescribed medications for heart disease. Reviews the medication, class of the drug, and side effects. Includes the steps to properly store meds and maintain the prescription regimen.   Cardiac Medications II: -Group verbal and written instruction to review commonly prescribed medications for heart disease. Reviews the medication, class of the drug, and side effects. (all other drug classes)    Go Sex-Intimacy & Heart Disease, Get SMART - Goal Setting: - Group verbal and written instruction through game format to discuss heart disease and the return to sexual intimacy. Provides group verbal and written material  to discuss and apply goal setting through the application of the S.M.A.R.T. Method.   Other Matters of the Heart: - Provides group verbal, written materials and models to describe Heart  Failure, Angina, Valve Disease, Peripheral Artery Disease, and Diabetes in the realm of heart disease. Includes description of the disease process and treatment options available to the cardiac patient.   Exercise & Equipment Safety: - Individual verbal instruction and demonstration of equipment use and safety with use of the equipment.   Cardiac Rehab from 05/31/2017 in [facility] Cardiac and Pulmonary Rehab  Date  05/25/17  Educator  [facility]  Instruction Review Code  1- Verbalizes Understanding      Infection Prevention: - Provides verbal and written material to individual with discussion of infection control including proper hand washing and proper equipment cleaning during exercise session.   Cardiac Rehab from 05/31/2017 in [facility] Cardiac and Pulmonary Rehab  Date  05/25/17  Educator  [facility]  Instruction Review Code  1- Verbalizes Understanding      Falls Prevention: - Provides verbal and written material to individual with discussion of falls prevention and safety.   Cardiac Rehab from 05/31/2017 in [facility] Cardiac and Pulmonary Rehab  Date  05/25/17  Educator  [facility]  Instruction Review Code  1- Verbalizes Understanding      Diabetes: - Individual verbal and written instruction to review signs/symptoms of diabetes, desired ranges of glucose level fasting, after meals and with exercise. Acknowledge that pre and post exercise glucose checks will be done for 3 sessions at entry of program.   Cardiac Rehab from 05/31/2017 in [facility] Cardiac and Pulmonary Rehab  Date  05/25/17  Educator  [facility]  Instruction Review Code  1- Verbalizes Understanding      Other: -Provides group and verbal instruction on various topics (see comments)    Knowledge Questionnaire Score: Knowledge Questionnaire Score - 05/25/17 1328      Knowledge Questionnaire Score   Pre Score  22/28 Correct answers reviewed with Trilby Drummer        Core Components/Risk Factors/Patient Goals at Admission: Personal Goals and Risk Factors  at Admission - 05/25/17 1317      Core Components/Risk Factors/Patient Goals on Admission    Weight Management  Yes;Weight Maintenance    Intervention  Weight Management: Develop a combined nutrition and exercise program designed to reach desired caloric intake, while maintaining appropriate intake of nutrient and fiber, sodium and fats, and appropriate energy expenditure required for the weight goal.;Weight Management: Provide education and appropriate resources to help participant work on and attain dietary goals.    Admit Weight  147 lb (66.7 kg) Stays between 145-147 lb at home and wishes to continue to stay there    Goal Weight: Short Term  145 lb (65.8 kg)    Goal Weight: Long Term  145 lb (65.8 kg)    Expected Outcomes  Short Term: Continue to assess and modify interventions until short term weight is achieved;Long Term: Adherence to nutrition and physical activity/exercise program aimed toward attainment of established weight goal;Weight Maintenance: Understanding of the daily nutrition guidelines, which includes 25-35% calories from fat, 7% or less cal from saturated fats, less than 262m cholesterol, less than 1.5gm of sodium, & 5 or more servings of fruits and vegetables daily;Understanding recommendations for meals to include 15-35% energy as protein, 25-35% energy from fat, 35-60% energy from carbohydrates, less than 2038mof dietary cholesterol, 20-35 gm of total fiber daily;Understanding of distribution of calorie intake throughout the day with the consumption  of 4-5 meals/snacks    Diabetes  Yes    Intervention  Provide education about signs/symptoms and action to take for hypo/hyperglycemia.;Provide education about proper nutrition, including hydration, and aerobic/resistive exercise prescription along with prescribed medications to achieve blood glucose in normal ranges: Fasting glucose 65-99 mg/dL    Expected Outcomes  Short Term: Participant verbalizes understanding of the  signs/symptoms and immediate care of hyper/hypoglycemia, proper foot care and importance of medication, aerobic/resistive exercise and nutrition plan for blood glucose control.;Long Term: Attainment of HbA1C < 7%.    Heart Failure  Yes    Intervention  Provide a combined exercise and nutrition program that is supplemented with education, support and counseling about heart failure. Directed toward relieving symptoms such as shortness of breath, decreased exercise tolerance, and extremity edema.    Expected Outcomes  Improve functional capacity of life;Short term: Attendance in program 2-3 days a week with increased exercise capacity. Reported lower sodium intake. Reported increased fruit and vegetable intake. Reports medication compliance.;Short term: Daily weights obtained and reported for increase. Utilizing diuretic protocols set by physician.;Long term: Adoption of self-care skills and reduction of barriers for early signs and symptoms recognition and intervention leading to self-care maintenance.    Hypertension  Yes    Intervention  Provide education on lifestyle modifcations including regular physical activity/exercise, weight management, moderate sodium restriction and increased consumption of fresh fruit, vegetables, and low fat dairy, alcohol moderation, and smoking cessation.;Monitor prescription use compliance.    Expected Outcomes  Short Term: Continued assessment and intervention until BP is < 140/19m HG in hypertensive participants. < 130/842mHG in hypertensive participants with diabetes, heart failure or chronic kidney disease.;Long Term: Maintenance of blood pressure at goal levels.    Lipids  Yes    Intervention  Provide education and support for participant on nutrition & aerobic/resistive exercise along with prescribed medications to achieve LDL <7052mHDL >52m54m  Expected Outcomes  Short Term: Participant states understanding of desired cholesterol values and is compliant with  medications prescribed. Participant is following exercise prescription and nutrition guidelines.;Long Term: Cholesterol controlled with medications as prescribed, with individualized exercise RX and with personalized nutrition plan. Value goals: LDL < 70mg64mL > 40 mg.    Stress  Yes He has had a rough couple of years health wise. From cancer and requiring a trach to a NSTEMI and Heart Failure. MelviEviantressed about the future and what else could go wrong.     Intervention  Offer individual and/or small group education and counseling on adjustment to heart disease, stress management and health-related lifestyle change. Teach and support self-help strategies.;Refer participants experiencing significant psychosocial distress to appropriate mental health specialists for further evaluation and treatment. When possible, include family members and significant others in education/counseling sessions.    Expected Outcomes  Short Term: Participant demonstrates changes in health-related behavior, relaxation and other stress management skills, ability to obtain effective social support, and compliance with psychotropic medications if prescribed.;Long Term: Emotional wellbeing is indicated by absence of clinically significant psychosocial distress or social isolation.       Core Components/Risk Factors/Patient Goals Review:    Core Components/Risk Factors/Patient Goals at Discharge (Final Review):    ITP Comments: ITP Comments    Row Name 05/25/17 1302 06/07/17 1037         ITP Comments  Med review completed. Initial ITP created. Diagnosis can be found in [facility]18  30 day review. Continue with ITP unless directed changes per Medical Director  review.          Comments:

## 2017-06-08 DIAGNOSIS — I214 Non-ST elevation (NSTEMI) myocardial infarction: Secondary | ICD-10-CM

## 2017-06-08 NOTE — Progress Notes (Signed)
Daily Session Note  Patient Details  Name: Kerry [name] MRN: [id number] Date of Birth: 03/11/1934 Referring Provider:     Cardiac Rehab from 05/25/2017 in [facility] Cardiac and Pulmonary Rehab  Referring Provider  Fath      Encounter Date: 06/08/2017  Check In: Session Check In - 06/08/17 1639      Check-In   Location  [facility]-Cardiac & Pulmonary Rehab    Staff Present  Nada Maclachlan, BA, ACSM CEP, Exercise Physiologist;Meredith Sherryll Burger, RN BSN;Darshawn Boateng Flavia Shipper    Supervising physician immediately available to respond to emergencies  See telemetry face sheet for immediately available ER MD    Medication changes reported      No    Fall or balance concerns reported     No    Warm-up and Cool-down  Performed on first and last piece of equipment    Resistance Training Performed  Yes    VAD Patient?  No      Pain Assessment   Currently in Pain?  No/denies          Social History   Tobacco Use  Smoking Status Former Smoker  . Packs/day: 0.50  . Years: 20.00  . Pack years: 10.00  . Types: Cigarettes  . Last attempt to quit: 12/05/2014  . Years since quitting: 2.5  Smokeless Tobacco Never Used  Tobacco Comment   Quit 12/2014    Goals Met:  Independence with exercise equipment Exercise tolerated well No report of cardiac concerns or symptoms Strength training completed today  Goals Unmet:  Not Applicable  Comments: Pt able to follow exercise prescription today without complaint.  Will continue to monitor for progression.   Dr. Emily Filbert is Medical Director for [place] and [facility].

## 2017-06-12 ENCOUNTER — Encounter: Payer: Medicare Other | Admitting: *Deleted

## 2017-06-12 DIAGNOSIS — I214 Non-ST elevation (NSTEMI) myocardial infarction: Secondary | ICD-10-CM

## 2017-06-12 NOTE — Progress Notes (Signed)
Incomplete Session Note  Patient Details  Name: Kerry [name] MRN: [id number] Date of Birth: 06-Apr-1934 Referring Provider:     Cardiac Rehab from 05/25/2017 in [facility] Cardiac and Pulmonary Rehab  Referring Provider  Fath      Kerry [name] did not complete his rehab session.  Kerry [name] came to class tonight and stating that he was having trouble catching his breath. His SaO2 was 88%. He rested and it came up to 90%. His weight was up by 2 lbs and he was sent home to rest and to take his standing order for extra lasix.

## 2017-06-14 ENCOUNTER — Telehealth: Payer: Self-pay | Admitting: *Deleted

## 2017-06-14 NOTE — Telephone Encounter (Signed)
Dillard's wife called to let us know he was admitted to hospital with excess fluid.  He will be in the hospital for several days.  She was advised to call us when Raziel is discharged and we will figure out from there when he can reenter the program.

## 2017-06-15 ENCOUNTER — Telehealth: Payer: Self-pay | Admitting: *Deleted

## 2017-06-15 ENCOUNTER — Encounter: Payer: Self-pay | Admitting: *Deleted

## 2017-06-15 NOTE — Telephone Encounter (Signed)
I called Mrs. Herbig. She reported that Kerry [name] was short of breath on Tuesday 06/13/2017 so she took him to the [facility] and they admitted him for fluid around his abd and gave him a diuretic. She said they are probably going to discharge him tomorrow from [facility] and the MD told her it was ok for [name] to return to Cardiac Rehab.

## 2017-06-19 DIAGNOSIS — I214 Non-ST elevation (NSTEMI) myocardial infarction: Secondary | ICD-10-CM

## 2017-06-19 LAB — GLUCOSE, CAPILLARY
GLUCOSE-CAPILLARY: 174 mg/dL — AB (ref 65–99)
GLUCOSE-CAPILLARY: 154 mg/dL — AB (ref 65–99)

## 2017-06-19 MED ORDER — AMLODIPINE BESYLATE 5 MG PO TABS
2.50 | ORAL_TABLET | ORAL | Status: DC
Start: 2017-06-18 — End: 2017-06-19

## 2017-06-19 MED ORDER — GLUCAGON HCL RDNA (DIAGNOSTIC) 1 MG IJ SOLR
1.00 | INTRAMUSCULAR | Status: DC
Start: ? — End: 2017-06-19

## 2017-06-19 MED ORDER — DEXTROSE 50 % IV SOLN
12.50 | INTRAVENOUS | Status: DC
Start: ? — End: 2017-06-19

## 2017-06-19 MED ORDER — LIDOCAINE HCL (PF) 1 % IJ SOLN
.50 | INTRAMUSCULAR | Status: DC
Start: ? — End: 2017-06-19

## 2017-06-19 MED ORDER — TORSEMIDE 20 MG PO TABS
20.00 | ORAL_TABLET | ORAL | Status: DC
Start: 2017-06-18 — End: 2017-06-19

## 2017-06-19 MED ORDER — FERROUS SULFATE 324 (65 FE) MG PO TBEC
324.00 | DELAYED_RELEASE_TABLET | ORAL | Status: DC
Start: 2017-06-18 — End: 2017-06-19

## 2017-06-19 MED ORDER — CHOLECALCIFEROL 25 MCG (1000 UT) PO TABS
1000.00 | ORAL_TABLET | ORAL | Status: DC
Start: 2017-06-18 — End: 2017-06-19

## 2017-06-19 MED ORDER — CLOPIDOGREL BISULFATE 75 MG PO TABS
75.00 | ORAL_TABLET | ORAL | Status: DC
Start: 2017-06-18 — End: 2017-06-19

## 2017-06-19 MED ORDER — ATORVASTATIN CALCIUM 80 MG PO TABS
80.00 | ORAL_TABLET | ORAL | Status: DC
Start: 2017-06-17 — End: 2017-06-19

## 2017-06-19 MED ORDER — METOPROLOL SUCCINATE ER 25 MG PO TB24
25.00 | ORAL_TABLET | ORAL | Status: DC
Start: 2017-06-18 — End: 2017-06-19

## 2017-06-19 MED ORDER — GENERIC EXTERNAL MEDICATION
Status: DC
Start: ? — End: 2017-06-19

## 2017-06-19 MED ORDER — HEPARIN SODIUM (PORCINE) 5000 UNIT/ML IJ SOLN
5000.00 | INTRAMUSCULAR | Status: DC
Start: 2017-06-17 — End: 2017-06-19

## 2017-06-19 MED ORDER — MIRTAZAPINE 15 MG PO TABS
7.50 | ORAL_TABLET | ORAL | Status: DC
Start: 2017-06-17 — End: 2017-06-19

## 2017-06-19 MED ORDER — ASPIRIN 81 MG PO CHEW
81.00 | CHEWABLE_TABLET | ORAL | Status: DC
Start: 2017-06-18 — End: 2017-06-19

## 2017-06-19 NOTE — Progress Notes (Signed)
Daily Session Note  Patient Details  Name: Ramie Erman MRN: [id number] Date of Birth: January 27, 1934 Referring Provider:     Cardiac Rehab from 05/25/2017 in [facility] Cardiac and Pulmonary Rehab  Referring Provider  Fath      Encounter Date: 06/19/2017  Check In: Session Check In - 06/19/17 1725      Check-In   Location  [facility]-Cardiac & Pulmonary Rehab    Staff Present  Earlean Shawl, BS, ACSM CEP, Exercise Physiologist;Mansa Willers Oletta Darter, BA, ACSM CEP, Exercise Physiologist;Carroll Enterkin, RN, BSN    Supervising physician immediately available to respond to emergencies  See telemetry face sheet for immediately available ER MD    Medication changes reported      No    Fall or balance concerns reported     No    Warm-up and Cool-down  Performed on first and last piece of equipment    Resistance Training Performed  Yes    VAD Patient?  No      Pain Assessment   Currently in Pain?  No/denies    Multiple Pain Sites  No          Social History   Tobacco Use  Smoking Status Former Smoker  . Packs/day: 0.50  . Years: 20.00  . Pack years: 10.00  . Types: Cigarettes  . Last attempt to quit: 12/05/2014  . Years since quitting: 2.5  Smokeless Tobacco Never Used  Tobacco Comment   Quit 12/2014    Goals Met:  Independence with exercise equipment Changing diet to healthy choices, watching portion sizes No report of cardiac concerns or symptoms Strength training completed today  Goals Unmet:  Not Applicable  Comments: Pt able to follow exercise prescription today without complaint.  Will continue to monitor for progression.  Quay is returning after a hospital stay - he brought written clearance to return to exercise.    Dr. Emily Filbert is Medical Director for [place] and [facility].

## 2017-06-21 ENCOUNTER — Encounter: Payer: Medicare Other | Admitting: *Deleted

## 2017-06-21 DIAGNOSIS — I214 Non-ST elevation (NSTEMI) myocardial infarction: Secondary | ICD-10-CM

## 2017-06-21 NOTE — Progress Notes (Signed)
Daily Session Note  Patient Details  Name: Kerry [name] MRN: [id number] Date of Birth: 11/17/1933 Referring Provider:     Cardiac Rehab from 05/25/2017 in [facility] Cardiac and Pulmonary Rehab  Referring Provider  Fath      Encounter Date: 06/21/2017  Check In: Session Check In - 06/21/17 1730      Check-In   Location  [facility]-Cardiac & Pulmonary Rehab    Staff Present  Meredith Craven, RN BSN;Amanda Sommer, BA, ACSM CEP, Exercise Physiologist;Carroll Enterkin, RN, BSN    Supervising physician immediately available to respond to emergencies  See telemetry face sheet for immediately available ER MD    Medication changes reported      No    Fall or balance concerns reported     No    Warm-up and Cool-down  Performed on first and last piece of equipment    Resistance Training Performed  Yes    VAD Patient?  No      Pain Assessment   Currently in Pain?  No/denies          Social History   Tobacco Use  Smoking Status Former Smoker  . Packs/day: 0.50  . Years: 20.00  . Pack years: 10.00  . Types: Cigarettes  . Last attempt to quit: 12/05/2014  . Years since quitting: 2.5  Smokeless Tobacco Never Used  Tobacco Comment   Quit 12/2014    Goals Met:  Independence with exercise equipment Exercise tolerated well No report of cardiac concerns or symptoms Strength training completed today  Goals Unmet:  Not Applicable  Comments: Pt able to follow exercise prescription today without complaint.  Will continue to monitor for progression.    Dr. Mark Miller is Medical Director for [facility] and [facility]. 

## 2017-06-22 DIAGNOSIS — I214 Non-ST elevation (NSTEMI) myocardial infarction: Secondary | ICD-10-CM

## 2017-06-22 NOTE — Progress Notes (Signed)
Daily Session Note  Patient Details  Name: Kerry [name] MRN: [id number] Date of Birth: 11/07/1933 Referring Provider:     Cardiac Rehab from 05/25/2017 in [facility] Cardiac and Pulmonary Rehab  Referring Provider  Fath      Encounter Date: 06/22/2017  Check In: Session Check In - 06/22/17 1636      Check-In   Location  [facility]-Cardiac & Pulmonary Rehab    Staff Present  Kelly Hayes, BS, ACSM CEP, Exercise Physiologist;Meredith Craven, RN BSN;Joseph Hood RCP,RRT,BSRT    Supervising physician immediately available to respond to emergencies  See telemetry face sheet for immediately available ER MD    Medication changes reported      No    Fall or balance concerns reported     No    Warm-up and Cool-down  Performed on first and last piece of equipment    Resistance Training Performed  Yes    VAD Patient?  No      Pain Assessment   Currently in Pain?  No/denies          Social History   Tobacco Use  Smoking Status Former Smoker  . Packs/day: 0.50  . Years: 20.00  . Pack years: 10.00  . Types: Cigarettes  . Last attempt to quit: 12/05/2014  . Years since quitting: 2.5  Smokeless Tobacco Never Used  Tobacco Comment   Quit 12/2014    Goals Met:  Independence with exercise equipment Exercise tolerated well No report of cardiac concerns or symptoms Strength training completed today  Goals Unmet:  Not Applicable  Comments: Pt able to follow exercise prescription today without complaint.  Will continue to monitor for progression.   Dr. Mark Miller is Medical Director for [facility] and [facility]. 

## 2017-06-26 DIAGNOSIS — I214 Non-ST elevation (NSTEMI) myocardial infarction: Secondary | ICD-10-CM | POA: Diagnosis not present

## 2017-06-26 NOTE — Progress Notes (Signed)
Daily Session Note  Patient Details  Name: Kerry [name] MRN: [id number] Date of Birth: Apr 04, 1934 Referring Provider:     Cardiac Rehab from 05/25/2017 in [facility] Cardiac and Pulmonary Rehab  Referring Provider  Fath      Encounter Date: 06/26/2017  Check In: Session Check In - 06/26/17 1722      Check-In   Location  [facility]-Cardiac & Pulmonary Rehab    Staff Present  [place], BS, [place];Nada Maclachlan, BA, ACSM CEP, Exercise Physiologist;Carroll Enterkin, RN, BSN    Supervising physician immediately available to respond to emergencies  See telemetry face sheet for immediately available ER MD    Medication changes reported      No    Fall or balance concerns reported     No    Tobacco Cessation  No Change    Warm-up and Cool-down  Performed on first and last piece of equipment    Resistance Training Performed  Yes    VAD Patient?  No      Pain Assessment   Currently in Pain?  No/denies    Multiple Pain Sites  No          Social History   Tobacco Use  Smoking Status Former Smoker  . Packs/day: 0.50  . Years: 20.00  . Pack years: 10.00  . Types: Cigarettes  . Last attempt to quit: 12/05/2014  . Years since quitting: 2.5  Smokeless Tobacco Never Used  Tobacco Comment   Quit 12/2014    Goals Met:  Independence with exercise equipment Exercise tolerated well No report of cardiac concerns or symptoms Strength training completed today  Goals Unmet:  Not Applicable  Comments: Pt able to follow exercise prescription today without complaint.  Will continue to monitor for progression.    Dr. Emily Filbert is Medical Director for [place] and [facility].

## 2017-06-28 DIAGNOSIS — I214 Non-ST elevation (NSTEMI) myocardial infarction: Secondary | ICD-10-CM | POA: Diagnosis not present

## 2017-06-28 NOTE — Progress Notes (Signed)
Daily Session Note  Patient Details  Name: Jayzon Taras MRN: [id number] Date of Birth: 09-02-1933 Referring Provider:     Cardiac Rehab from 05/25/2017 in [facility] Cardiac and Pulmonary Rehab  Referring Provider  Fath      Encounter Date: 06/28/2017  Check In: Session Check In - 06/28/17 1650      Check-In   Location  [facility]-Cardiac & Pulmonary Rehab    Staff Present  Joellyn Rued, BS, PEC;Meredith [place], RN Vickki Hearing, BA, ACSM CEP, Exercise Physiologist    Supervising physician immediately available to respond to emergencies  See telemetry face sheet for immediately available ER MD    Medication changes reported      No    Fall or balance concerns reported     No    Warm-up and Cool-down  Performed on first and last piece of equipment    Resistance Training Performed  Yes    VAD Patient?  No      Pain Assessment   Currently in Pain?  No/denies    Multiple Pain Sites  No        Exercise Prescription Changes - 06/28/17 1500      Response to Exercise   Blood Pressure (Admit)  102/60    Blood Pressure (Exercise)  118/62    Blood Pressure (Exit)  110/68    Heart Rate (Admit)  103 bpm    Heart Rate (Exercise)  113 bpm    Heart Rate (Exit)  82 bpm    Rating of Perceived Exertion (Exercise)  13    Symptoms  none    Duration  Continue with 45 min of aerobic exercise without signs/symptoms of physical distress.    Intensity  THRR unchanged      Progression   Progression  Continue to progress workloads to maintain intensity without signs/symptoms of physical distress.    Average METs  2.5      Resistance Training   Training Prescription  Yes    Weight  3 lb    Reps  10-15      Interval Training   Interval Training  No      Treadmill   MPH  1.8    Grade  0    Minutes  15    METs  2.38      REL-XR   Level  3    Speed  50    Minutes  15    METs  2.6       Social History   Tobacco Use  Smoking Status Former Smoker  . Packs/day: 0.50  . Years: 20.00   . Pack years: 10.00  . Types: Cigarettes  . Last attempt to quit: 12/05/2014  . Years since quitting: 2.5  Smokeless Tobacco Never Used  Tobacco Comment   Quit 12/2014    Goals Met:  Independence with exercise equipment Exercise tolerated well Personal goals reviewed  Goals Unmet:  Not Applicable  Comments: Pt able to follow exercise prescription today without complaint.  Will continue to monitor for progression.   Dr. Emily Filbert is Medical Director for [place] and [facility].

## 2017-07-05 ENCOUNTER — Encounter: Payer: Self-pay | Admitting: *Deleted

## 2017-07-05 DIAGNOSIS — I214 Non-ST elevation (NSTEMI) myocardial infarction: Secondary | ICD-10-CM

## 2017-07-05 NOTE — Progress Notes (Signed)
Daily Session Note  Patient Details  Name: Carney Saxton MRN: [id number] Date of Birth: [birth year] Referring Provider:     Cardiac Rehab from 05/25/2017 in [facility] Cardiac and Pulmonary Rehab  Referring Provider  Fath      Encounter Date: 07/05/2017  Check In: Session Check In - 07/05/17 1738      Check-In   Location  [facility]-Cardiac & Pulmonary Rehab    Staff Present  Renita Papa, RN Vickki Hearing, BA, ACSM CEP, Exercise Physiologist;Carroll Enterkin, RN, BSN    Supervising physician immediately available to respond to emergencies  See telemetry face sheet for immediately available ER MD    Medication changes reported      No    Fall or balance concerns reported     No    Warm-up and Cool-down  Performed on first and last piece of equipment    Resistance Training Performed  Yes    VAD Patient?  No      Pain Assessment   Currently in Pain?  No/denies    Multiple Pain Sites  No          Social History   Tobacco Use  Smoking Status Former Smoker  . Packs/day: 0.50  . Years: 20.00  . Pack years: 10.00  . Types: Cigarettes  . Last attempt to quit: 12/05/2014  . Years since quitting: 2.5  Smokeless Tobacco Never Used  Tobacco Comment   Quit 12/2014    Goals Met:  Independence with exercise equipment Exercise tolerated well No report of cardiac concerns or symptoms Strength training completed today  Goals Unmet:  Not Applicable  Comments: Pt able to follow exercise prescription today without complaint.  Will continue to monitor for progression.    Dr. Emily Filbert is Medical Director for [place] and [facility].

## 2017-07-05 NOTE — Progress Notes (Signed)
Cardiac Individual Treatment Plan  Patient Details  Name: Kerry [name] MRN: [id number] Date of Birth: [birth year] Referring Provider:     Cardiac Rehab from 05/25/2017 in [facility] Cardiac and Pulmonary Rehab  Referring Provider  Fath      Initial Encounter Date:    Cardiac Rehab from 05/25/2017 in [facility] Cardiac and Pulmonary Rehab  Date  05/25/17  Referring Provider  Fath      Visit Diagnosis: NSTEMI (non-ST elevated myocardial infarction) [facility])  Patient's Home Medications on Admission:  Current Outpatient Medications:  .  amLODipine (NORVASC) 10 MG tablet, , Disp: , Rfl:  .  aspirin 81 MG chewable tablet, Chew by mouth., Disp: , Rfl:  .  atorvastatin (LIPITOR) 80 MG tablet, , Disp: , Rfl:  .  Cholecalciferol (VITAMIN D3) 1000 units CAPS, Take by mouth., Disp: , Rfl:  .  clopidogrel (PLAVIX) 75 MG tablet, , Disp: , Rfl:  .  GLIPIZIDE XL 10 MG 24 hr tablet, , Disp: , Rfl:  .  glucagon (GLUCAGON EMERGENCY) 1 MG injection, as needed. Reported on 06/02/2015, Disp: , Rfl:  .  glucose blood (PRECISION QID TEST) test strip, Accu check Aviva strips, use twice daily, Disp: , Rfl:  .  hydrALAZINE (APRESOLINE) 10 MG tablet, , Disp: , Rfl:  .  isosorbide mononitrate (IMDUR) 30 MG 24 hr tablet, , Disp: , Rfl:  .  metoprolol succinate (TOPROL-XL) 100 MG 24 hr tablet, , Disp: , Rfl:  .  mirtazapine (REMERON) 7.5 MG tablet, , Disp: , Rfl:  .  nitroGLYCERIN (NITROSTAT) 0.3 MG SL tablet, , Disp: , Rfl:  .  torsemide (DEMADEX) 20 MG tablet, , Disp: , Rfl:   Past Medical History: No past medical history on file.  Tobacco Use: Social History   Tobacco Use  Smoking Status Former Smoker  . Packs/day: 0.50  . Years: 20.00  . Pack years: 10.00  . Types: Cigarettes  . Last attempt to quit: 12/05/2014  . Years since quitting: 2.5  Smokeless Tobacco Never Used  Tobacco Comment   Quit 12/2014    Labs: Recent Review Flowsheet Data    There is no flowsheet data to display.       Exercise  Target Goals:    Exercise Program Goal: Individual exercise prescription set using results from initial 6 min walk test and THRR while considering  patient's activity barriers and safety.   Exercise Prescription Goal: Initial exercise prescription builds to 30-45 minutes a day of aerobic activity, 2-3 days per week.  Home exercise guidelines will be given to patient during program as part of exercise prescription that the participant will acknowledge.  Activity Barriers & Risk Stratification: Activity Barriers & Cardiac Risk Stratification - 05/25/17 1331      Activity Barriers & Cardiac Risk Stratification   Activity Barriers  Other (comment);Shortness of Breath    Comments  Patient has a chronic Trach     Cardiac Risk Stratification  High       6 Minute Walk: 6 Minute Walk    Row Name 05/25/17 1353         6 Minute Walk   Distance  1030 feet     Walk Time  6 minutes     # of Rest Breaks  0     MPH  1.95     METS  1.91     RPE  11     Perceived Dyspnea   1     VO2 Peak  6.68  Resting HR  74 bpm     Resting BP  108/60     Resting Oxygen Saturation   94 %     Exercise Oxygen Saturation  during 6 min walk  95 %     Max Ex. HR  106 bpm     Max Ex. BP  114/58     2 Minute Post BP  102/58        Oxygen Initial Assessment:   Oxygen Re-Evaluation:   Oxygen Discharge (Final Oxygen Re-Evaluation):   Initial Exercise Prescription: Initial Exercise Prescription - 05/25/17 1300      Date of Initial Exercise RX and Referring Provider   Date  05/25/17    Referring Provider  Fath      Treadmill   MPH  1.8    Grade  0    Minutes  15    METs  2.38      NuStep   Level  2    SPM  80    Minutes  15    METs  2      Recumbant Elliptical   Level  1    RPM  50    Minutes  15    METs  2      REL-XR   Level  3    Speed  50    Minutes  15    METs  2      Prescription Details   Frequency (times per week)  3    Duration  Progress to 45 minutes of aerobic  exercise without signs/symptoms of physical distress      Intensity   THRR 40-80% of Max Heartrate  100-126    Ratings of Perceived Exertion  11-13    Perceived Dyspnea  0-4      Resistance Training   Training Prescription  Yes    Weight  3 lb    Reps  10-15       Perform Capillary Blood Glucose checks as needed.  Exercise Prescription Changes: Exercise Prescription Changes    Row Name 05/25/17 1300 06/14/17 1500 06/28/17 1500         Response to Exercise   Blood Pressure (Admit)  108/60  114/66  102/60     Blood Pressure (Exercise)  114/58  98/44  118/62     Blood Pressure (Exit)  102/58  106/62  110/68     Heart Rate (Admit)  86 bpm  75 bpm  103 bpm     Heart Rate (Exercise)  106 bpm  103 bpm  113 bpm     Heart Rate (Exit)  82 bpm  75 bpm  82 bpm     Oxygen Saturation (Admit)  94 %  -  -     Rating of Perceived Exertion (Exercise)  _0 Symptoms  -  none  none     Duration  -  Continue with 45 min of aerobic exercise without signs/symptoms of physical distress.  Continue with 45 min of aerobic exercise without signs/symptoms of physical distress.     Intensity  -  THRR unchanged  THRR unchanged       Progression   Progression  -  Continue to progress workloads to maintain intensity without signs/symptoms of physical distress.  Continue to progress workloads to maintain intensity without signs/symptoms of physical distress.     Average METs  -  2.4  2.5  Resistance Training   Training Prescription  -  Yes  Yes     Weight  -  3 lb  3 lb     Reps  -  10-15  10-15       Interval Training   Interval Training  -  No  No       Treadmill   MPH  -  1.8  1.8     Grade  -  0  0     Minutes  -  15  15     METs  -  2.38  2.38       NuStep   Level  -  3  -     SPM  -  80  -     Minutes  -  15  -     METs  -  2.3  -       REL-XR   Level  -  3  3     Speed  -  50  50     Minutes  -  15  15     METs  -  2.5  2.6        Exercise Comments: Exercise  Comments    Row Name 05/31/17 1708 06/19/17 1727         Exercise Comments  First full day of exercise!  Patient was oriented to gym and equipment including functions, settings, policies, and procedures.  Patient's individual exercise prescription and treatment plan were reviewed.  All starting workloads were established based on the results of the 6 minute walk test done at initial orientation visit.  The plan for exercise progression was also introduced and progression will be customized based on patient's performance and goals   Jarid is returning after a hospital stay - he brought written clearance to return to exercise.         Exercise Goals and Review: Exercise Goals    Row Name 05/25/17 1352             Exercise Goals   Increase Physical Activity  Yes       Intervention  Provide advice, education, support and counseling about physical activity/exercise needs.;Develop an individualized exercise prescription for aerobic and resistive training based on initial evaluation findings, risk stratification, comorbidities and participant's personal goals.       Expected Outcomes  Achievement of increased cardiorespiratory fitness and enhanced flexibility, muscular endurance and strength shown through measurements of functional capacity and personal statement of participant.       Increase Strength and Stamina  Yes       Intervention  Provide advice, education, support and counseling about physical activity/exercise needs.;Develop an individualized exercise prescription for aerobic and resistive training based on initial evaluation findings, risk stratification, comorbidities and participant's personal goals.       Expected Outcomes  Achievement of increased cardiorespiratory fitness and enhanced flexibility, muscular endurance and strength shown through measurements of functional capacity and personal statement of participant.       Able to understand and use rate of perceived exertion (RPE)  scale  Yes       Intervention  Provide education and explanation on how to use RPE scale       Expected Outcomes  Short Term: Able to use RPE daily in rehab to express subjective intensity level;Long Term:  Able to use RPE to guide intensity level when exercising independently       Able to understand  and use Dyspnea scale  Yes       Intervention  Provide education and explanation on how to use Dyspnea scale       Expected Outcomes  Short Term: Able to use Dyspnea scale daily in rehab to express subjective sense of shortness of breath during exertion;Long Term: Able to use Dyspnea scale to guide intensity level when exercising independently       Knowledge and understanding of Target Heart Rate Range (THRR)  Yes       Intervention  Provide education and explanation of THRR including how the numbers were predicted and where they are located for reference       Expected Outcomes  Short Term: Able to state/look up THRR;Long Term: Able to use THRR to govern intensity when exercising independently;Short Term: Able to use daily as guideline for intensity in rehab       Able to check pulse independently  Yes       Intervention  Provide education and demonstration on how to check pulse in carotid and radial arteries.;Review the importance of being able to check your own pulse for safety during independent exercise       Expected Outcomes  Short Term: Able to explain why pulse checking is important during independent exercise;Long Term: Able to check pulse independently and accurately       Understanding of Exercise Prescription  Yes       Intervention  Provide education, explanation, and written materials on patient's individual exercise prescription       Expected Outcomes  Short Term: Able to explain program exercise prescription;Long Term: Able to explain home exercise prescription to exercise independently          Exercise Goals Re-Evaluation : Exercise Goals Re-Evaluation    Row Name 05/31/17 1708  06/14/17 1505 06/28/17 1516         Exercise Goal Re-Evaluation   Exercise Goals Review  Able to understand and use rate of perceived exertion (RPE) scale;Knowledge and understanding of Target Heart Rate Range (THRR);Understanding of Exercise Prescription;Increase Strength and Stamina;Increase Physical Activity  Increase Physical Activity;Increase Strength and Stamina  Increase Physical Activity;Increase Strength and Stamina;Able to understand and use rate of perceived exertion (RPE) scale     Comments  Reviewed RPE scale, THR and program prescription with pt today.  Pt voiced understanding and was given a copy of goals to take home.   Chazz did well his first 2 sessions.  He is not able to attend today due to being in hospital for fluid overload.  Staff will monitor when he returns.  Paden has stated that he was unsure about the program on his first visit but has come to enjoy the exercise and comraderie.       Expected Outcomes  Short: Use RPE daily to regulate intensity.  Long: Follow program prescription in THR.  Short - Jimi will be able to return to class.  Long - Isamu will attend regularly.  Short - Deago will continue to attend Long - Tajai will improve overall MET level        Discharge Exercise Prescription (Final Exercise Prescription Changes): Exercise Prescription Changes - 06/28/17 1500      Response to Exercise   Blood Pressure (Admit)  102/60    Blood Pressure (Exercise)  118/62    Blood Pressure (Exit)  110/68    Heart Rate (Admit)  103 bpm    Heart Rate (Exercise)  113 bpm    Heart Rate (Exit)  82 bpm    Rating of Perceived Exertion (Exercise)  13    Symptoms  none    Duration  Continue with 45 min of aerobic exercise without signs/symptoms of physical distress.    Intensity  THRR unchanged      Progression   Progression  Continue to progress workloads to maintain intensity without signs/symptoms of physical distress.    Average METs  2.5      Resistance  Training   Training Prescription  Yes    Weight  3 lb    Reps  10-15      Interval Training   Interval Training  No      Treadmill   MPH  1.8    Grade  0    Minutes  15    METs  2.38      REL-XR   Level  3    Speed  50    Minutes  15    METs  2.6       Nutrition:  Target Goals: Understanding of nutrition guidelines, daily intake of sodium <1570m, cholesterol <2076m calories 30% from fat and 7% or less from saturated fats, daily to have 5 or more servings of fruits and vegetables.  Biometrics: Pre Biometrics - 05/25/17 1351      Pre Biometrics   Height  _0  (1.676 m)    Weight  151 lb (68.5 kg)    Waist Circumference  36 inches    Hip Circumference  37 inches    Waist to Hip Ratio  0.97 %    BMI (Calculated)  24.38    Single Leg Stand  9.35 seconds        Nutrition Therapy Plan and Nutrition Goals: Nutrition Therapy & Goals - 05/25/17 1321      Intervention Plan   Intervention  Prescribe, educate and counsel regarding individualized specific dietary modifications aiming towards targeted core components such as weight, hypertension, lipid management, diabetes, heart failure and other comorbidities.;Nutrition handout(s) given to patient.    Expected Outcomes  Short Term Goal: Understand basic principles of dietary content, such as calories, fat, sodium, cholesterol and nutrients.;Short Term Goal: A plan has been developed with personal nutrition goals set during dietitian appointment.;Long Term Goal: Adherence to prescribed nutrition plan.       Nutrition Assessments: Nutrition Assessments - 06/01/17 1754      MEDFICTS Scores   Pre Score  41       Nutrition Goals Re-Evaluation:   Nutrition Goals Discharge (Final Nutrition Goals Re-Evaluation):   Psychosocial: Target Goals: Acknowledge presence or absence of significant depression and/or stress, maximize coping skills, provide positive support system. Participant is able to verbalize types and ability  to use techniques and skills needed for reducing stress and depression.   Initial Review & Psychosocial Screening: Initial Psych Review & Screening - 05/25/17 1323      Initial Review   Current issues with  Current Anxiety/Panic;Current Sleep Concerns;Current Stress Concerns    Source of Stress Concerns  Chronic Illness;Unable to participate in former interests or hobbies    Comments  e has had a rough couple of years health wise. From cancer and requiring a trach to a NSTEMI and Heart Failure. Me[place]undiatas stressed about the future and what else could go wrong. He may need dialysis some day. Palliative has recently gotten involved in his care and prescribed sleeping medicine that helps sometimes [facility]      Family Dynamics   Good  Support System?  Yes Wife and Doristine Bosworth      Barriers   Psychosocial barriers to participate in program  The patient should benefit from training in stress management and relaxation.      Screening Interventions   Interventions  Yes;Encouraged to exercise;Program counselor consult    Expected Outcomes  Short Term goal: Utilizing psychosocial counselor, staff and physician to assist with identification of specific Stressors or current issues interfering with healing process. Setting desired goal for each stressor or current issue identified.;Long Term Goal: Stressors or current issues are controlled or eliminated.;Short Term goal: Identification and review with participant of any Quality of Life or Depression concerns found by scoring the questionnaire.;Long Term goal: The participant improves quality of Life and PHQ9 Scores as seen by post scores and/or verbalization of changes       Quality of Life Scores:  Quality of Life - 05/25/17 1327      Quality of Life Scores   Health/Function Pre  21 %    Socioeconomic Pre  21 %    Psych/Spiritual Pre  20.86 %    Family Pre  21 %    GLOBAL Pre  20.97 %      Scores of 19 and below usually indicate a poorer quality  of life in these areas.  A difference of  2-3 points is a clinically meaningful difference.  A difference of 2-3 points in the total score of the Quality of Life Index has been associated with significant improvement in overall quality of life, self-image, physical symptoms, and general health in studies assessing change in quality of life.  PHQ-9: Recent Review Flowsheet Data    Depression screen [facility] 2/9 05/25/2017   Decreased Interest 1   Down, Depressed, Hopeless 0   PHQ - 2 Score 1   Altered sleeping 0   Tired, decreased energy 3   Change in appetite 2   Feeling bad or failure about yourself  0   Trouble concentrating 0   Moving slowly or fidgety/restless 0   Suicidal thoughts 0   PHQ-9 Score 6   Difficult doing work/chores Somewhat difficult     Interpretation of Total Score  Total Score Depression Severity:  1-4 = Minimal depression, 5-9 = Mild depression, 10-14 = Moderate depression, 15-19 = Moderately severe depression, 20-27 = Severe depression   Psychosocial Evaluation and Intervention: Psychosocial Evaluation - 05/31/17 1713      Psychosocial Evaluation & Interventions   Interventions  Encouraged to exercise with the program and follow exercise prescription    Comments  Counselor met with Mr. Scurlock [facility]) for initial psychosocial evaluation.  He is an 84 year old who had a heart attack approx. 6 weeks ago.  He has a strong support system with a spouse of 56 years and active involvement in his local church.  He also has a large family in [place].  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  He was not that difficult to understand.  He reports sleeping okay with the help of medication; but his appetite has been poor for the past 2-3 months.  Mel denies a history of depression or anxiety or any current symptoms and states his mood is generally positive most of the time.  He reports his health as his primary stressor currently.  He has goals to get  stronger overall while in this program.  staff will follow with Mel.    Expected Outcomes  Mel  will benefit from consistent exercise to achieve his stated goals.  The educational and psychoeducational components of this program will be helpful in learning more and coping more positively with his cardiac condition.  Staff will follow with Mel    Continue Psychosocial Services   Follow up required by staff       Psychosocial Re-Evaluation:   Psychosocial Discharge (Final Psychosocial Re-Evaluation):   Vocational Rehabilitation: Provide vocational rehab assistance to qualifying candidates.   Vocational Rehab Evaluation & Intervention: Vocational Rehab - 05/25/17 1328      Initial Vocational Rehab Evaluation & Intervention   Assessment shows need for Vocational Rehabilitation  No       Education: Education Goals: Education classes will be provided on a variety of topics geared toward better understanding of heart health and risk factor modification. Participant will state understanding/return demonstration of topics presented as noted by education test scores.  Learning Barriers/Preferences: Learning Barriers/Preferences - 05/25/17 1328      Learning Barriers/Preferences   Learning Barriers  None    Learning Preferences  None       Education Topics:  AED/CPR: - Group verbal and written instruction with the use of models to demonstrate the basic use of the AED with the basic ABC's of resuscitation.   General Nutrition Guidelines/Fats and Fiber: -Group instruction provided by verbal, written material, models and posters to present the general guidelines for heart healthy nutrition. Gives an explanation and review of dietary fats and fiber.   Controlling Sodium/Reading Food Labels: -Group verbal and written material supporting the discussion of sodium use in heart healthy nutrition. Review and explanation with models, verbal and written materials for utilization of the food  label.   Exercise Physiology & General Exercise Guidelines: - Group verbal and written instruction with models to review the exercise physiology of the cardiovascular system and associated critical values. Provides general exercise guidelines with specific guidelines to those with heart or lung disease.    Cardiac Rehab from 06/28/2017 in [facility] Cardiac and Pulmonary Rehab  Date  05/31/17  Educator  AS  Instruction Review Code  1- Verbalizes Understanding      Aerobic Exercise & Resistance Training: - Gives group verbal and written instruction on the various components of exercise. Focuses on aerobic and resistive training programs and the benefits of this training and how to safely progress through these programs..   Cardiac Rehab from 06/28/2017 in [facility] Cardiac and Pulmonary Rehab  Date  06/07/17  Educator  AS  Instruction Review Code  1- Verbalizes Understanding      Flexibility, Balance, Mind/Body Relaxation: Provides group verbal/written instruction on the benefits of flexibility and balance training, including mind/body exercise modes such as yoga, pilates and tai chi.  Demonstration and skill practice provided.   Stress and Anxiety: - Provides group verbal and written instruction about the health risks of elevated stress and causes of high stress.  Discuss the correlation between heart/lung disease and anxiety and treatment options. Review healthy ways to manage with stress and anxiety.   Cardiac Rehab from 06/28/2017 in [facility] Cardiac and Pulmonary Rehab  Date  06/28/17  Educator  [facility]  Instruction Review Code  1- Verbalizes Understanding      Depression: - Provides group verbal and written instruction on the correlation between heart/lung disease and depressed mood, treatment options, and the stigmas associated with seeking treatment.   Anatomy & Physiology of the Heart: - Group verbal and written instruction and models provide basic cardiac anatomy and physiology, with  the  coronary electrical and arterial systems. Review of Valvular disease and Heart Failure   Cardiac Rehab from 06/28/2017 in [facility] Cardiac and Pulmonary Rehab  Date  06/26/17  Educator  CE  Instruction Review Code  1- Verbalizes Understanding      Cardiac Procedures: - Group verbal and written instruction to review commonly prescribed medications for heart disease. Reviews the medication, class of the drug, and side effects. Includes the steps to properly store meds and maintain the prescription regimen. (beta blockers and nitrates)   Cardiac Medications I: - Group verbal and written instruction to review commonly prescribed medications for heart disease. Reviews the medication, class of the drug, and side effects. Includes the steps to properly store meds and maintain the prescription regimen.   Cardiac Rehab from 06/28/2017 in [facility] Cardiac and Pulmonary Rehab  Date  06/19/17 [Part 2 06/22/27 CE]  Educator  [facility]  Instruction Review Code  1- Verbalizes Understanding      Cardiac Medications II: -Group verbal and written instruction to review commonly prescribed medications for heart disease. Reviews the medication, class of the drug, and side effects. (all other drug classes)    Go Sex-Intimacy & Heart Disease, Get SMART - Goal Setting: - Group verbal and written instruction through game format to discuss heart disease and the return to sexual intimacy. Provides group verbal and written material to discuss and apply goal setting through the application of the S.M.A.R.T. Method.   Other Matters of the Heart: - Provides group verbal, written materials and models to describe Stable Angina and Peripheral Artery. Includes description of the disease process and treatment options available to the cardiac patient.   Exercise & Equipment Safety: - Individual verbal instruction and demonstration of equipment use and safety with use of the equipment.   Cardiac Rehab from 06/28/2017 in [facility] Cardiac and  Pulmonary Rehab  Date  05/25/17  Educator  [facility]  Instruction Review Code  1- Verbalizes Understanding      Infection Prevention: - Provides verbal and written material to individual with discussion of infection control including proper hand washing and proper equipment cleaning during exercise session.   Cardiac Rehab from 06/28/2017 in [facility] Cardiac and Pulmonary Rehab  Date  05/25/17  Educator  [facility]  Instruction Review Code  1- Verbalizes Understanding      Falls Prevention: - Provides verbal and written material to individual with discussion of falls prevention and safety.   Cardiac Rehab from 06/28/2017 in [facility] Cardiac and Pulmonary Rehab  Date  05/25/17  Educator  [facility]  Instruction Review Code  1- Verbalizes Understanding      Diabetes: - Individual verbal and written instruction to review signs/symptoms of diabetes, desired ranges of glucose level fasting, after meals and with exercise. Acknowledge that pre and post exercise glucose checks will be done for 3 sessions at entry of program.   Cardiac Rehab from 06/28/2017 in [facility] Cardiac and Pulmonary Rehab  Date  05/25/17  Educator  [facility]  Instruction Review Code  1- Verbalizes Understanding      Know Your Numbers and Risk Factors: -Group verbal and written instruction about important numbers in your health.  Discussion of what are risk factors and how they play a role in the disease process.  Review of Cholesterol, Blood Pressure, Diabetes, and BMI and the role they play in your overall health.   Sleep Hygiene: -Provides group verbal and written instruction about how sleep can affect your health.  Define sleep hygiene, discuss sleep cycles and  impact of sleep habits. Review good sleep hygiene tips.    Other: -Provides group and verbal instruction on various topics (see comments)   Knowledge Questionnaire Score: Knowledge Questionnaire Score - 05/25/17 1328      Knowledge Questionnaire Score   Pre Score  22/28 Correct answers  reviewed with Trilby Drummer        Core Components/Risk Factors/Patient Goals at Admission: Personal Goals and Risk Factors at Admission - 05/25/17 1317      Core Components/Risk Factors/Patient Goals on Admission    Weight Management  Yes;Weight Maintenance    Intervention  Weight Management: Develop a combined nutrition and exercise program designed to reach desired caloric intake, while maintaining appropriate intake of nutrient and fiber, sodium and fats, and appropriate energy expenditure required for the weight goal.;Weight Management: Provide education and appropriate resources to help participant work on and attain dietary goals.    Admit Weight  147 lb (66.7 kg) Stays between 145-147 lb at home and wishes to continue to stay there    Goal Weight: Short Term  145 lb (65.8 kg)    Goal Weight: Long Term  145 lb (65.8 kg)    Expected Outcomes  Short Term: Continue to assess and modify interventions until short term weight is achieved;Long Term: Adherence to nutrition and physical activity/exercise program aimed toward attainment of established weight goal;Weight Maintenance: Understanding of the daily nutrition guidelines, which includes 25-35% calories from fat, 7% or less cal from saturated fats, less than '200mg'$  cholesterol, less than 1.5gm of sodium, & 5 or more servings of fruits and vegetables daily;Understanding recommendations for meals to include 15-35% energy as protein, 25-35% energy from fat, 35-60% energy from carbohydrates, less than '200mg'$  of dietary cholesterol, 20-35 gm of total fiber daily;Understanding of distribution of calorie intake throughout the day with the consumption of 4-5 meals/snacks    Diabetes  Yes    Intervention  Provide education about signs/symptoms and action to take for hypo/hyperglycemia.;Provide education about proper nutrition, including hydration, and aerobic/resistive exercise prescription along with prescribed medications to achieve blood glucose in normal  ranges: Fasting glucose 65-99 mg/dL    Expected Outcomes  Short Term: Participant verbalizes understanding of the signs/symptoms and immediate care of hyper/hypoglycemia, proper foot care and importance of medication, aerobic/resistive exercise and nutrition plan for blood glucose control.;Long Term: Attainment of HbA1C < 7%.    Heart Failure  Yes    Intervention  Provide a combined exercise and nutrition program that is supplemented with education, support and counseling about heart failure. Directed toward relieving symptoms such as shortness of breath, decreased exercise tolerance, and extremity edema.    Expected Outcomes  Improve functional capacity of life;Short term: Attendance in program 2-3 days a week with increased exercise capacity. Reported lower sodium intake. Reported increased fruit and vegetable intake. Reports medication compliance.;Short term: Daily weights obtained and reported for increase. Utilizing diuretic protocols set by physician.;Long term: Adoption of self-care skills and reduction of barriers for early signs and symptoms recognition and intervention leading to self-care maintenance.    Hypertension  Yes    Intervention  Provide education on lifestyle modifcations including regular physical activity/exercise, weight management, moderate sodium restriction and increased consumption of fresh fruit, vegetables, and low fat dairy, alcohol moderation, and smoking cessation.;Monitor prescription use compliance.    Expected Outcomes  Short Term: Continued assessment and intervention until BP is < 140/30m HG in hypertensive participants. < 130/890mHG in hypertensive participants with diabetes, heart failure or chronic kidney disease.;Long Term: Maintenance  of blood pressure at goal levels.    Lipids  Yes    Intervention  Provide education and support for participant on nutrition & aerobic/resistive exercise along with prescribed medications to achieve LDL <26m, HDL >453m     Expected Outcomes  Short Term: Participant states understanding of desired cholesterol values and is compliant with medications prescribed. Participant is following exercise prescription and nutrition guidelines.;Long Term: Cholesterol controlled with medications as prescribed, with individualized exercise RX and with personalized nutrition plan. Value goals: LDL < 7069mHDL > 40 mg.    Stress  Yes He has had a rough couple of years health wise. From cancer and requiring a trach to a NSTEMI and Heart Failure. MelKhayden stressed about the future and what else could go wrong.     Intervention  Offer individual and/or small group education and counseling on adjustment to heart disease, stress management and health-related lifestyle change. Teach and support self-help strategies.;Refer participants experiencing significant psychosocial distress to appropriate mental health specialists for further evaluation and treatment. When possible, include family members and significant others in education/counseling sessions.    Expected Outcomes  Short Term: Participant demonstrates changes in health-related behavior, relaxation and other stress management skills, ability to obtain effective social support, and compliance with psychotropic medications if prescribed.;Long Term: Emotional wellbeing is indicated by absence of clinically significant psychosocial distress or social isolation.       Core Components/Risk Factors/Patient Goals Review:  Goals and Risk Factor Review    Row Name 06/28/17 1729             Core Components/Risk Factors/Patient Goals Review   Personal Goals Review  Diabetes;Lipids;Hypertension       Review  MelMyates he is taking meds as prescribed - BP has been good in HT class.  He is motitoring BG at home.  He has noticed he sleeps better and can walk around better without fatigue during ADLS.  He is having some stress due to the unexpected death of his wifes niece       Expected  Outcomes  Short - MelRolfell use some of the stress management techniques and exercise to manage stress  Long - MelArlinll use exercise and othe techniques to manage stress long term          Core Components/Risk Factors/Patient Goals at Discharge (Final Review):  Goals and Risk Factor Review - 06/28/17 1729      Core Components/Risk Factors/Patient Goals Review   Personal Goals Review  Diabetes;Lipids;Hypertension    Review  MelKanoates he is taking meds as prescribed - BP has been good in HT class.  He is motitoring BG at home.  He has noticed he sleeps better and can walk around better without fatigue during ADLS.  He is having some stress due to the unexpected death of his wifes niece    Expected Outcomes  Short - MelLanill use some of the stress management techniques and exercise to manage stress  Long - MelDaevonll use exercise and othe techniques to manage stress long term       ITP Comments: ITP Comments    Row Name 05/25/17 1302 06/07/17 1037 06/15/17 1412 06/19/17 1727 07/05/17 0642   ITP Comments  Med review completed. Initial ITP created. [facility]  30 day review. Continue with ITP unless directed changes per Medical Director review.   I called Mrs. Murfin. She reported that MelZethans short of breath on Tuesday  06/13/2017 so she took him to the [facility] and they admitted him for fluid around his abd and gave him a diuretic. She said they are probably going to discharge him tomorrow from [facility] and the MD told her it was ok for Marquist to return to Cardiac Rehab.    Koden is returning after a hospital stay - he brought written clearance to return to exercise.  30 Day review. Continue with ITP unless directed changes per Medical Director review.       Comments:

## 2017-07-06 DIAGNOSIS — I214 Non-ST elevation (NSTEMI) myocardial infarction: Secondary | ICD-10-CM

## 2017-07-06 NOTE — Progress Notes (Signed)
Daily Session Note  Patient Details  Name: Averey Trompeter MRN: [id number] Date of Birth: Mar 27, 1934 Referring Provider:     Cardiac Rehab from 05/25/2017 in [facility] Cardiac and Pulmonary Rehab  Referring Provider  Fath      Encounter Date: 07/06/2017  Check In: Session Check In - 07/06/17 1722      Check-In   Location  [facility]-Cardiac & Pulmonary Rehab    Staff Present  Renita Papa, RN Moises Blood, BS, ACSM CEP, Exercise Physiologist;Shaughnessy Gethers Flavia Shipper    Supervising physician immediately available to respond to emergencies  See telemetry face sheet for immediately available ER MD    Medication changes reported      No    Fall or balance concerns reported     No    Warm-up and Cool-down  Performed on first and last piece of equipment    Resistance Training Performed  Yes    VAD Patient?  No      Pain Assessment   Currently in Pain?  No/denies          Social History   Tobacco Use  Smoking Status Former Smoker  . Packs/day: 0.50  . Years: 20.00  . Pack years: 10.00  . Types: Cigarettes  . Last attempt to quit: 12/05/2014  . Years since quitting: 2.5  Smokeless Tobacco Never Used  Tobacco Comment   Quit 12/2014    Goals Met:  Independence with exercise equipment Exercise tolerated well No report of cardiac concerns or symptoms Strength training completed today  Goals Unmet:  Not Applicable  Comments: Pt able to follow exercise prescription today without complaint.  Will continue to monitor for progression.    Dr. Emily Filbert is Medical Director for [place] and [facility].

## 2017-07-10 ENCOUNTER — Encounter: Payer: Medicare Other | Attending: Cardiology | Admitting: *Deleted

## 2017-07-10 DIAGNOSIS — I214 Non-ST elevation (NSTEMI) myocardial infarction: Secondary | ICD-10-CM | POA: Diagnosis not present

## 2017-07-10 NOTE — Progress Notes (Signed)
Daily Session Note  Patient Details  Name: Kerry [name] MRN: [id number] Date of Birth: December 27, 1933 Referring Provider:     Cardiac Rehab from 05/25/2017 in [facility] Cardiac and Pulmonary Rehab  Referring Provider  Fath      Encounter Date: 07/10/2017  Check In: Session Check In - 07/10/17 1639      Check-In   Location  [facility]-Cardiac & Pulmonary Rehab    Staff Present  Earlean Shawl, BS, ACSM CEP, Exercise Physiologist;Amanda Oletta Darter, BA, ACSM CEP, Exercise Physiologist;Carroll Enterkin, RN, BSN    Supervising physician immediately available to respond to emergencies  See telemetry face sheet for immediately available ER MD    Medication changes reported      No    Fall or balance concerns reported     No    Warm-up and Cool-down  Performed on first and last piece of equipment    Resistance Training Performed  Yes    VAD Patient?  No      Pain Assessment   Currently in Pain?  No/denies    Multiple Pain Sites  No          Social History   Tobacco Use  Smoking Status Former Smoker  . Packs/day: 0.50  . Years: 20.00  . Pack years: 10.00  . Types: Cigarettes  . Last attempt to quit: 12/05/2014  . Years since quitting: 2.5  Smokeless Tobacco Never Used  Tobacco Comment   Quit 12/2014    Goals Met:  Independence with exercise equipment Exercise tolerated well No report of cardiac concerns or symptoms Strength training completed today  Goals Unmet:  Not Applicable  Comments: Pt able to follow exercise prescription today without complaint.  Will continue to monitor for progression.    Dr. Emily Filbert is Medical Director for [place] and [facility].

## 2017-07-12 DIAGNOSIS — I214 Non-ST elevation (NSTEMI) myocardial infarction: Secondary | ICD-10-CM

## 2017-07-12 NOTE — Progress Notes (Signed)
Daily Session Note  Patient Details  Name: Kerry [name] MRN: [id number] Date of Birth: 08-25-1933 Referring Provider:     Cardiac Rehab from 05/25/2017 in [facility] Cardiac and Pulmonary Rehab  Referring Provider  Fath      Encounter Date: 07/12/2017  Check In: Session Check In - 07/12/17 1641      Check-In   Location  [facility]-Cardiac & Pulmonary Rehab    Staff Present  Renita Papa, RN Vickki Hearing, BA, ACSM CEP, Exercise Physiologist;Carroll Enterkin, RN, BSN    Supervising physician immediately available to respond to emergencies  See telemetry face sheet for immediately available ER MD    Medication changes reported      No    Fall or balance concerns reported     No    Warm-up and Cool-down  Performed on first and last piece of equipment    Resistance Training Performed  Yes    VAD Patient?  No      Pain Assessment   Currently in Pain?  No/denies    Multiple Pain Sites  No          Social History   Tobacco Use  Smoking Status Former Smoker  . Packs/day: 0.50  . Years: 20.00  . Pack years: 10.00  . Types: Cigarettes  . Last attempt to quit: 12/05/2014  . Years since quitting: 2.6  Smokeless Tobacco Never Used  Tobacco Comment   Quit 12/2014    Goals Met:  Independence with exercise equipment Exercise tolerated well No report of cardiac concerns or symptoms  Goals Unmet:  Not Applicable  Comments: Reviewed home exercise with pt today.  Pt plans to walk  for exercise.  Reviewed THR, pulse, RPE, sign and symptoms, NTG use, and when to call 911 or MD.  Also discussed weather considerations and indoor options.  Pt voiced understanding.   Dr. Emily Filbert is Medical Director for [place] and [facility].

## 2017-07-13 DIAGNOSIS — I214 Non-ST elevation (NSTEMI) myocardial infarction: Secondary | ICD-10-CM | POA: Diagnosis not present

## 2017-07-13 NOTE — Progress Notes (Signed)
Daily Session Note  Patient Details  Name: Kerry [name] MRN: [id number] Date of Birth: 03-05-1934 Referring Provider:     Cardiac Rehab from 05/25/2017 in [facility] Cardiac and Pulmonary Rehab  Referring Provider  Fath      Encounter Date: 07/13/2017  Check In: Session Check In - 07/13/17 1646      Check-In   Location  [facility]-Cardiac & Pulmonary Rehab    Staff Present  Earlean Shawl, BS, ACSM CEP, Exercise Physiologist;Meredith Sherryll Burger, RN BSN;Aislinn Feliz Flavia Shipper    Supervising physician immediately available to respond to emergencies  See telemetry face sheet for immediately available ER MD    Medication changes reported      No    Fall or balance concerns reported     No    Warm-up and Cool-down  Performed on first and last piece of equipment    Resistance Training Performed  Yes    VAD Patient?  No      Pain Assessment   Currently in Pain?  No/denies          Social History   Tobacco Use  Smoking Status Former Smoker  . Packs/day: 0.50  . Years: 20.00  . Pack years: 10.00  . Types: Cigarettes  . Last attempt to quit: 12/05/2014  . Years since quitting: 2.6  Smokeless Tobacco Never Used  Tobacco Comment   Quit 12/2014    Goals Met:  Independence with exercise equipment Exercise tolerated well No report of cardiac concerns or symptoms Strength training completed today  Goals Unmet:  Not Applicable  Comments: Pt able to follow exercise prescription today without complaint.  Will continue to monitor for progression.   Dr. Emily Filbert is Medical Director for [place] and [facility].

## 2017-07-17 ENCOUNTER — Encounter: Payer: Medicare Other | Admitting: *Deleted

## 2017-07-17 DIAGNOSIS — I214 Non-ST elevation (NSTEMI) myocardial infarction: Secondary | ICD-10-CM

## 2017-07-17 NOTE — Progress Notes (Signed)
Daily Session Note  Patient Details  Name: Kerry [name] MRN: [id number] Date of Birth: May 09, 1934 Referring Provider:     Cardiac Rehab from 05/25/2017 in [facility] Cardiac and Pulmonary Rehab  Referring Provider  Fath      Encounter Date: 07/17/2017  Check In: Session Check In - 07/17/17 1734      Check-In   Location  [facility]-Cardiac & Pulmonary Rehab    Staff Present  Earlean Shawl, BS, ACSM CEP, Exercise Physiologist;Joseph Hood RCP,RRT,BSRT;Mary Kellie Shropshire, RN, BSN, Kela Millin, BA, ACSM CEP, Exercise Physiologist    Supervising physician immediately available to respond to emergencies  See telemetry face sheet for immediately available ER MD    Medication changes reported      No    Fall or balance concerns reported     No    Warm-up and Cool-down  Performed on first and last piece of equipment    Resistance Training Performed  Yes    VAD Patient?  No      Pain Assessment   Currently in Pain?  No/denies    Multiple Pain Sites  No          Social History   Tobacco Use  Smoking Status Former Smoker  . Packs/day: 0.50  . Years: 20.00  . Pack years: 10.00  . Types: Cigarettes  . Last attempt to quit: 12/05/2014  . Years since quitting: 2.6  Smokeless Tobacco Never Used  Tobacco Comment   Quit 12/2014    Goals Met:  Independence with exercise equipment Exercise tolerated well No report of cardiac concerns or symptoms Strength training completed today  Goals Unmet:  Not Applicable  Comments: Pt able to follow exercise prescription today without complaint.  Will continue to monitor for progression.    Dr. Emily Filbert is Medical Director for [place] and [facility].

## 2017-07-19 ENCOUNTER — Other Ambulatory Visit: Payer: Self-pay

## 2017-07-19 ENCOUNTER — Encounter: Payer: Medicare Other | Admitting: *Deleted

## 2017-07-19 ENCOUNTER — Encounter: Payer: Self-pay | Admitting: Emergency Medicine

## 2017-07-19 ENCOUNTER — Emergency Department
Admission: EM | Admit: 2017-07-19 | Discharge: 2017-07-19 | Disposition: A | Discharge status: Home / Self Care | Payer: Medicare Other | Attending: Emergency Medicine | Admitting: Emergency Medicine

## 2017-07-19 ENCOUNTER — Emergency Department: Payer: Medicare Other

## 2017-07-19 VITALS — BP 130/62 | HR 90 | Wt 146.0 lb

## 2017-07-19 DIAGNOSIS — Z87891 Personal history of nicotine dependence: Secondary | ICD-10-CM | POA: Diagnosis not present

## 2017-07-19 DIAGNOSIS — E119 Type 2 diabetes mellitus without complications: Secondary | ICD-10-CM | POA: Insufficient documentation

## 2017-07-19 DIAGNOSIS — N184 Chronic kidney disease, stage 4 (severe): Secondary | ICD-10-CM | POA: Diagnosis not present

## 2017-07-19 DIAGNOSIS — R079 Chest pain, unspecified: Secondary | ICD-10-CM

## 2017-07-19 DIAGNOSIS — R0789 Other chest pain: Secondary | ICD-10-CM | POA: Diagnosis not present

## 2017-07-19 DIAGNOSIS — J449 Chronic obstructive pulmonary disease, unspecified: Secondary | ICD-10-CM | POA: Diagnosis not present

## 2017-07-19 DIAGNOSIS — I2510 Atherosclerotic heart disease of native coronary artery without angina pectoris: Secondary | ICD-10-CM | POA: Diagnosis not present

## 2017-07-19 DIAGNOSIS — I130 Hypertensive heart and chronic kidney disease with heart failure and stage 1 through stage 4 chronic kidney disease, or unspecified chronic kidney disease: Secondary | ICD-10-CM | POA: Diagnosis not present

## 2017-07-19 DIAGNOSIS — Z7984 Long term (current) use of oral hypoglycemic drugs: Secondary | ICD-10-CM | POA: Insufficient documentation

## 2017-07-19 DIAGNOSIS — Z7902 Long term (current) use of antithrombotics/antiplatelets: Secondary | ICD-10-CM | POA: Diagnosis not present

## 2017-07-19 DIAGNOSIS — Z7982 Long term (current) use of aspirin: Secondary | ICD-10-CM | POA: Diagnosis not present

## 2017-07-19 DIAGNOSIS — I509 Heart failure, unspecified: Secondary | ICD-10-CM | POA: Insufficient documentation

## 2017-07-19 DIAGNOSIS — I214 Non-ST elevation (NSTEMI) myocardial infarction: Secondary | ICD-10-CM

## 2017-07-19 HISTORY — DX: Disorder of kidney and ureter, unspecified: N28.9

## 2017-07-19 HISTORY — DX: Malignant (primary) neoplasm, unspecified: C80.1

## 2017-07-19 HISTORY — DX: Heart failure, unspecified: I50.9

## 2017-07-19 HISTORY — DX: Essential (primary) hypertension: I10

## 2017-07-19 HISTORY — DX: Chronic obstructive pulmonary disease, unspecified: J44.9

## 2017-07-19 HISTORY — DX: Type 2 diabetes mellitus without complications: E11.9

## 2017-07-19 LAB — BASIC METABOLIC PANEL
ANION GAP: 9 (ref 5–15)
BUN: 84 mg/dL — ABNORMAL HIGH (ref 6–20)
CHLORIDE: 109 mmol/L (ref 101–111)
CO2: 23 mmol/L (ref 22–32)
Calcium: 9.3 mg/dL (ref 8.9–10.3)
Creatinine, Ser: 3.96 mg/dL — ABNORMAL HIGH (ref 0.61–1.24)
GFR calc non Af Amer: 13 mL/min — ABNORMAL LOW (ref >60)
GFR, EST AFRICAN AMERICAN: 15 mL/min — AB (ref >60)
Glucose, Bld: 106 mg/dL — ABNORMAL HIGH (ref 65–99)
Potassium: 4.9 mmol/L (ref 3.5–5.1)
SODIUM: 141 mmol/L (ref 135–145)

## 2017-07-19 LAB — TROPONIN I
TROPONIN I: 0.03 ng/mL — AB (ref <0.03)
Troponin I: 0.03 ng/mL (ref <0.03)

## 2017-07-19 LAB — CBC
HEMATOCRIT: 30.9 % — AB (ref 40.0–52.0)
Hemoglobin: 10.1 g/dL — ABNORMAL LOW (ref 13.0–18.0)
MCH: 28.4 pg (ref 26.0–34.0)
MCHC: 32.7 g/dL (ref 32.0–36.0)
MCV: 86.8 fL (ref 80.0–100.0)
Platelets: 276 10*3/uL (ref 150–440)
RBC: 3.57 MIL/uL — AB (ref 4.40–5.90)
RDW: 14.6 % — ABNORMAL HIGH (ref 11.5–14.5)
WBC: 8.3 10*3/uL (ref 3.8–10.6)

## 2017-07-19 LAB — GLUCOSE, CAPILLARY: Glucose-Capillary: 151 mg/dL — ABNORMAL HIGH (ref 65–99)

## 2017-07-19 NOTE — ED Provider Notes (Signed)
[facility] Emergency Department Provider Note  Time seen: 8:59 PM  I have reviewed the triage vital signs and the nursing notes.   HISTORY  Chief Complaint Chest Pain    HPI Kerry [name] is a 84 y.o. male with a past medical history of CHF, COPD, diabetes, hypertension, presents to the emergency department for chest discomfort.  According to the patient he was at cardiac rehab today when he developed some mild right-sided chest discomfort.  States it was very brief but they checked his heart rhythm and he had abnormal heart rhythm so they sent him to the emergency department for evaluation.  Patient states he has had no further discomfort.  He denies any chest pain trouble breathing nausea or diaphoresis.  Patient states the chest pain he will get from time to time.  Currently he feels well with no complaints.   Past Medical History:  Diagnosis Date  . Cancer ([place])   . CHF (congestive heart failure) ([place])   . COPD (chronic obstructive pulmonary disease) ([place])   . Diabetes mellitus without complication ([place])   . Hypertension   . Renal disorder     Patient Active Problem List   Diagnosis Date Noted  . Diverticulosis 05/25/2017  . Hyperlipidemia 05/25/2017  . Hypertension 05/25/2017  . Type 2 diabetes mellitus ([place]) 05/25/2017  . Anemia of chronic disease 04/17/2017  . History of tracheostomy 03/14/2017  . NSTEMI (non-ST elevated myocardial infarction) ([place]) 03/13/2017  . 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 Low back pain 11/23/2016  . Vitamin D insufficiency 06/28/2016  . CKD (chronic kidney disease) stage 4, GFR 15-29 ml/min (HCC) 04/05/2016  . Coronary artery disease 02/09/2016  . Heart failure with preserved ejection fraction ([place]) 02/09/2016  . 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 History of hyperkalemia 02/12/2015  . 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 Hypopharyngeal mass  11/28/2014  . Vocal cord paralysis 11/28/2014  . Sleep apnea 12/14/2011  . REM behavioral disorder 12/14/2011  . Smoker 07/22/2011    Past Surgical History:  Procedure Laterality Date  . BACK SURGERY      Prior to Admission medications   Medication Sig Start Date End Date Taking? Authorizing Provider  amLODipine (NORVASC) 10 MG tablet  05/18/17   [provider]  aspirin 81 MG chewable tablet Chew by mouth. 03/15/17   [provider]  atorvastatin (LIPITOR) 80 MG tablet  03/15/17   [provider]  Cholecalciferol (VITAMIN D3) 1000 units CAPS Take by mouth.    [provider]  clopidogrel (PLAVIX) 75 MG tablet  05/10/17   [provider]  GLIPIZIDE XL 10 MG 24 hr tablet  05/05/17   [provider]  glucagon (GLUCAGON EMERGENCY) 1 MG injection as needed. Reported on 06/02/2015 01/19/15   [provider]  glucose blood (PRECISION QID TEST) test strip Accu check Aviva strips, use twice daily 01/16/15   [provider]  hydrALAZINE (APRESOLINE) 10 MG tablet  03/01/17   [provider]  isosorbide mononitrate (IMDUR) 30 MG 24 hr tablet  05/15/17   [provider]  metoprolol succinate (TOPROL-XL) 100 MG 24 hr tablet  03/01/17   [provider]  mirtazapine (REMERON) 7.5 MG tablet  05/11/17   [provider]  nitroGLYCERIN (NITROSTAT) 0.3 MG SL tablet  03/16/17   [provider]  torsemide (DEMADEX) 20 MG tablet  05/15/17   [provider]  Allergies  Allergen Reactions  . Ace Inhibitors Other (See Comments)    Potassium elevated  . Gabapentin Other (See Comments)    Ataxia at 100 mg.  . Losartan Other (See Comments)    Hyperkalemia    No family history on file.  Social History Social History   Tobacco Use  . Smoking status: Former Smoker    Packs/day: 0.50    Years: 20.00    Pack years: 10.00    Types: Cigarettes    Last attempt to quit: 12/05/2014     Years since quitting: 2.6  . Smokeless tobacco: Never Used  . Tobacco comment: Quit 12/2014  Substance Use Topics  . Alcohol use: No    Frequency: Never  . Drug use: No    Review of Systems Constitutional: Negative for fever. Eyes: Negative for visual complaints ENT: Negative for recent illness/congestion Cardiovascular: Mild right-sided chest discomfort brief, now resolved. Respiratory: Negative for shortness of breath. Gastrointestinal: Negative for abdominal pain, vomiting and diarrhea. Genitourinary: Negative for dysuria. Musculoskeletal: Negative for leg pain or swelling Skin: Negative for skin complaints  Neurological: Negative for headache All other ROS negative  ____________________________________________   PHYSICAL EXAM:  VITAL SIGNS: ED Triage Vitals  Enc Vitals Group     BP 07/19/17 1815 127/73     Pulse Rate 07/19/17 1815 93     Resp 07/19/17 1815 20     Temp 07/19/17 1815 98.4 F (36.9 C)     Temp Source 07/19/17 1815 Oral     SpO2 07/19/17 1815 100 %     Weight 07/19/17 1816 146 lb (66.2 kg)     Height 07/19/17 1816 5\' 6"  (1.676 m)     Head Circumference --      Peak Flow --      Pain Score 07/19/17 1816 2     Pain Loc --      Pain Edu? --      Excl. in [place]? --    Constitutional: Alert and oriented. Well appearing and in no distress. Eyes: Normal exam ENT   Head: Normocephalic and atraumatic.   Mouth/Throat: Mucous membranes are moist.  Tracheostomy present. Cardiovascular: Normal rate, regular rhythm.  Respiratory: Normal respiratory effort without tachypnea nor retractions. Breath sounds are clear Gastrointestinal: Soft and nontender. No distention.  Musculoskeletal: Nontender with normal range of motion in all extremities. No lower extremity tenderness or edema. Neurologic:  Normal speech and language. No gross focal neurologic deficits Skin:  Skin is warm, dry and intact.  Psychiatric: Mood and affect are  normal.  ____________________________________________    EKG  EKG reviewed and interpreted by myself appears to show A. fib versus sinus arrhythmia at 102 bpm with a narrow QRS, left axis deviation largely normal intervals with nonspecific ST changes.  ____________________________________________    RADIOLOGY  Chest x-ray normal  ____________________________________________   INITIAL IMPRESSION / ASSESSMENT AND PLAN / ED COURSE  Pertinent labs & imaging results that were available during my care of the patient were reviewed by me and considered in my medical decision making (see chart for details).  Patient presents to the emergency department from cardiac rehab states mild right-sided chest discomfort which has since resolved.  I reviewed the patient's rhythm strips from cardiac rehab they appear to be most consistent with runs of PVCs versus ventricular tachycardia.  Differential would include arrhythmias, PVCs, chest pain, ACS, angina.  I discussed workup with patient including labs.  Labs currently are normal/baseline for the patient.  I  reviewed his care everywhere chart and creatinine is largely unchanged.  Troponin 0 0.03.  Chest x-ray is negative.  We will repeat a troponin.  I discussed the possibilities of admission to the hospital versus discharge home, patient strongly wishes to be discharged home.  He is agreeable to repeat troponin.  Patient has had no concerning runs of ventricular tachycardia in the emergency department but will continue to monitor on telemetry.  We will repeat an EKG as a first EKG appeared to show A. fib versus sinus arrhythmia but there is considerable electrical interference.  Repeat troponin remains normal.  Patient will occasionally have a PVC on the monitor but no other concerning findings.  Continues to deny any symptoms in the emergency department.  Patient wishes to be discharged home we will discharge home he has cardiology follow-up with Dr.Fath  on Friday.  I discussed return precautions for any chest pain trouble breathing.  Patient agreeable to plan.  ____________________________________________   FINAL CLINICAL IMPRESSION(S) / ED DIAGNOSES  Chest pain    Harvest Dark, MD 07/19/17 2213

## 2017-07-19 NOTE — Discharge Instructions (Signed)
You have been seen in the emergency department today for chest pain. Your workup has shown normal results. As we discussed please follow-up with your primary care physician in the next 1-2 days for recheck. Return to the emergency department for any further chest pain, trouble breathing, or any other symptom personally concerning to yourself. °

## 2017-07-19 NOTE — Progress Notes (Signed)
Daily Session Note  Patient Details  Name: Khylin Gutridge MRN: [id number] Date of Birth: [birth year] Referring Provider:     Cardiac Rehab from 05/25/2017 in [facility] Cardiac and Pulmonary Rehab  Referring Provider  Fath      Encounter Date: 07/19/2017  Check In: Session Check In - 07/19/17 1659      Check-In   Location  [facility]-Cardiac & Pulmonary Rehab    Staff Present  Renita Papa, RN Vickki Hearing, BA, ACSM CEP, Exercise Physiologist;Carroll Enterkin, RN, BSN    Supervising physician immediately available to respond to emergencies  See telemetry face sheet for immediately available ER MD    Medication changes reported      No    Fall or balance concerns reported     No    Warm-up and Cool-down  Performed on first and last piece of equipment    Resistance Training Performed  Yes    VAD Patient?  No      Pain Assessment   Currently in Pain?  No/denies          Social History   Tobacco Use  Smoking Status Former Smoker  . Packs/day: 0.50  . Years: 20.00  . Pack years: 10.00  . Types: Cigarettes  . Last attempt to quit: 12/05/2014  . Years since quitting: 2.6  Smokeless Tobacco Never Used  Tobacco Comment   Quit 12/2014    Goals Met:  Independence with exercise equipment Exercise tolerated well Personal goals reviewed No report of cardiac concerns or symptoms Strength training completed today  Goals Unmet:  Not Applicable  Comments: Pt able to follow exercise prescription today without complaint.  Will continue to monitor for progression.    Dr. Emily Filbert is Medical Director for [place] and [facility].

## 2017-07-19 NOTE — ED Triage Notes (Signed)
Pt arrived from cardiac rehabilitation for abnormal ekg. Cardiac rehab RN reports they noticed many PVCs during rehab and brought patient in for further evaluation. Pt states he goes 3 days a week. Pt reports mild chest pain on the right side of his chest.

## 2017-07-24 DIAGNOSIS — I214 Non-ST elevation (NSTEMI) myocardial infarction: Secondary | ICD-10-CM

## 2017-07-24 NOTE — Progress Notes (Signed)
Daily Session Note  Patient Details  Name: Kerry [name] MRN: [id number] Date of Birth: 09/04/1933 Referring Provider:     Cardiac Rehab from 05/25/2017 in [facility] Cardiac and Pulmonary Rehab  Referring Provider  Fath      Encounter Date: 07/24/2017  Check In: Session Check In - 07/24/17 1630      Check-In   Location  [facility]-Cardiac & Pulmonary Rehab    Staff Present  Kelly Hayes, BS, ACSM CEP, Exercise Physiologist;Amanda Sommer, BA, ACSM CEP, Exercise Physiologist;Carroll Enterkin, RN, BSN    Supervising physician immediately available to respond to emergencies  See telemetry face sheet for immediately available ER MD    Medication changes reported      No    Fall or balance concerns reported     No    Warm-up and Cool-down  Performed on first and last piece of equipment    Resistance Training Performed  Yes    VAD Patient?  No      Pain Assessment   Currently in Pain?  No/denies    Multiple Pain Sites  No          Social History   Tobacco Use  Smoking Status Former Smoker  . Packs/day: 0.50  . Years: 20.00  . Pack years: 10.00  . Types: Cigarettes  . Last attempt to quit: 12/05/2014  . Years since quitting: 2.6  Smokeless Tobacco Never Used  Tobacco Comment   Quit 12/2014    Goals Met:  Independence with exercise equipment Exercise tolerated well No report of cardiac concerns or symptoms Strength training completed today  Goals Unmet:  Not Applicable  Comments: Pt able to follow exercise prescription today without complaint.  Will continue to monitor for progression.    Dr. Mark Miller is Medical Director for [facility] and [facility]. 

## 2017-07-26 ENCOUNTER — Encounter: Payer: Medicare Other | Admitting: *Deleted

## 2017-07-26 VITALS — Ht 66.0 in | Wt 147.0 lb

## 2017-07-26 DIAGNOSIS — I214 Non-ST elevation (NSTEMI) myocardial infarction: Secondary | ICD-10-CM | POA: Diagnosis not present

## 2017-07-26 NOTE — Progress Notes (Signed)
Daily Session Note  Patient Details  Name: Kerry [name] MRN: [id number] Date of Birth: 05/09/1934 Referring Provider:     Cardiac Rehab from 05/25/2017 in [facility] Cardiac and Pulmonary Rehab  Referring Provider  Fath      Encounter Date: 07/26/2017  Check In: Session Check In - 07/26/17 1639      Check-In   Location  [facility]-Cardiac & Pulmonary Rehab    Staff Present  Renita Papa, RN Vickki Hearing, BA, ACSM CEP, Exercise Physiologist;Carroll Enterkin, RN, BSN    Supervising physician immediately available to respond to emergencies  See telemetry face sheet for immediately available ER MD    Medication changes reported      No    Fall or balance concerns reported     No    Warm-up and Cool-down  Performed on first and last piece of equipment    Resistance Training Performed  Yes    VAD Patient?  No      Pain Assessment   Currently in Pain?  No/denies          Social History   Tobacco Use  Smoking Status Former Smoker  . Packs/day: 0.50  . Years: 20.00  . Pack years: 10.00  . Types: Cigarettes  . Last attempt to quit: 12/05/2014  . Years since quitting: 2.6  Smokeless Tobacco Never Used  Tobacco Comment   Quit 12/2014    Goals Met:  Independence with exercise equipment Exercise tolerated well No report of cardiac concerns or symptoms Strength training completed today  Goals Unmet:  Not Applicable  Comments: Pt able to follow exercise prescription today without complaint.  Will continue to monitor for progression.    Dr. Emily Filbert is Medical Director for [place] and [facility].

## 2017-07-27 DIAGNOSIS — I214 Non-ST elevation (NSTEMI) myocardial infarction: Secondary | ICD-10-CM

## 2017-07-27 NOTE — Progress Notes (Signed)
Daily Session Note  Patient Details  Name: Kerry [name] MRN: [id number] Date of Birth: 01-Feb-1934 Referring Provider:     Cardiac Rehab from 05/25/2017 in [facility] Cardiac and Pulmonary Rehab  Referring Provider  Fath      Encounter Date: 07/27/2017  Check In: Session Check In - 07/27/17 1712      Check-In   Location  [facility]-Cardiac & Pulmonary Rehab    Staff Present  Earlean Shawl, BS, ACSM CEP, Exercise Physiologist;Amanda Oletta Darter, BA, ACSM CEP, Exercise Physiologist;Meredith Sherryll Burger, RN BSN;Joseph Flavia Shipper    Supervising physician immediately available to respond to emergencies  See telemetry face sheet for immediately available ER MD    Medication changes reported      No    Fall or balance concerns reported     No    Warm-up and Cool-down  Performed on first and last piece of equipment    Resistance Training Performed  Yes    VAD Patient?  No      Pain Assessment   Currently in Pain?  No/denies    Multiple Pain Sites  No          Social History   Tobacco Use  Smoking Status Former Smoker  . Packs/day: 0.50  . Years: 20.00  . Pack years: 10.00  . Types: Cigarettes  . Last attempt to quit: 12/05/2014  . Years since quitting: 2.6  Smokeless Tobacco Never Used  Tobacco Comment   Quit 12/2014    Goals Met:  Independence with exercise equipment Exercise tolerated well No report of cardiac concerns or symptoms Strength training completed today  Goals Unmet:  Not Applicable  Comments: Pt able to follow exercise prescription today without complaint.  Will continue to monitor for progression.    Dr. Emily Filbert is Medical Director for [place] and [facility].

## 2017-07-31 ENCOUNTER — Encounter: Payer: Medicare Other | Admitting: *Deleted

## 2017-07-31 DIAGNOSIS — I214 Non-ST elevation (NSTEMI) myocardial infarction: Secondary | ICD-10-CM

## 2017-07-31 NOTE — Progress Notes (Signed)
Daily Session Note  Patient Details  Name: Kerry [name] MRN: [id number] Date of Birth: [birth year] Referring Provider:     Cardiac Rehab from 05/25/2017 in [facility] Cardiac and Pulmonary Rehab  Referring Provider  Fath      Encounter Date: 07/31/2017  Check In: Session Check In - 07/31/17 1729      Check-In   Location  [facility]-Cardiac & Pulmonary Rehab    Staff Present  Nada Maclachlan, BA, ACSM CEP, Exercise Physiologist;Kizzy Olafson Amedeo Plenty, BS, ACSM CEP, Exercise Physiologist;Meredith Sherryll Burger, RN BSN;Susanne Bice, RN, BSN, Florestine Avers, RN BSN    Supervising physician immediately available to respond to emergencies  See telemetry face sheet for immediately available ER MD    Medication changes reported      No    Fall or balance concerns reported     No    Warm-up and Cool-down  Performed on first and last piece of equipment    Resistance Training Performed  Yes    VAD Patient?  No      Pain Assessment   Multiple Pain Sites  No          Social History   Tobacco Use  Smoking Status Former Smoker  . Packs/day: 0.50  . Years: 20.00  . Pack years: 10.00  . Types: Cigarettes  . Last attempt to quit: 12/05/2014  . Years since quitting: 2.6  Smokeless Tobacco Never Used  Tobacco Comment   Quit 12/2014    Goals Met:  Independence with exercise equipment Exercise tolerated well No report of cardiac concerns or symptoms Strength training completed today  Goals Unmet:  Not Applicable  Comments: Pt able to follow exercise prescription today without complaint.  Will continue to monitor for progression.    Dr. Emily Filbert is Medical Director for [place] and [facility].

## 2017-08-01 NOTE — Patient Instructions (Signed)
Discharge Patient Instructions  Patient Details  Name: Kerry [name] MRN: [id number] Date of Birth: Sep 21, 1933 Referring Provider:  Teodoro Spray, MD   Number of Visits: [phone]  Reason for Discharge:  Patient reached a stable level of exercise. Patient independent in their exercise. Patient has met program and personal goals.  Smoking History:  Social History   Tobacco Use  Smoking Status Former Smoker  . Packs/day: 0.50  . Years: 20.00  . Pack years: 10.00  . Types: Cigarettes  . Last attempt to quit: 12/05/2014  . Years since quitting: 2.6  Smokeless Tobacco Never Used  Tobacco Comment   Quit 12/2014    Diagnosis:  NSTEMI (non-ST elevated myocardial infarction) [facility])  Initial Exercise Prescription: Initial Exercise Prescription - 05/25/17 1300      Date of Initial Exercise RX and Referring Provider   Date  05/25/17    Referring Provider  Fath      Treadmill   MPH  1.8    Grade  0    Minutes  15    METs  2.38      NuStep   Level  2    SPM  80    Minutes  15    METs  2      Recumbant Elliptical   Level  1    RPM  50    Minutes  15    METs  2      REL-XR   Level  3    Speed  50    Minutes  15    METs  2      Prescription Details   Frequency (times per week)  3    Duration  Progress to 45 minutes of aerobic exercise without signs/symptoms of physical distress      Intensity   THRR 40-80% of Max Heartrate  100-126    Ratings of Perceived Exertion  11-13    Perceived Dyspnea  0-4      Resistance Training   Training Prescription  Yes    Weight  3 lb    Reps  10-15       Discharge Exercise Prescription (Final Exercise Prescription Changes): Exercise Prescription Changes - 07/25/17 1500      Response to Exercise   Blood Pressure (Admit)  122/60    Blood Pressure (Exercise)  110/50    Heart Rate (Admit)  86 bpm    Heart Rate (Exercise)  99 bpm    Heart Rate (Exit)  86 bpm    Rating of Perceived Exertion (Exercise)  12    Symptoms  none    Duration  Continue with 45 min of aerobic exercise without signs/symptoms of physical distress.    Intensity  THRR unchanged      Progression   Progression  Continue to progress workloads to maintain intensity without signs/symptoms of physical distress.    Average METs  3      Resistance Training   Training Prescription  Yes    Weight  3 lb    Reps  10-15      Interval Training   Interval Training  No      NuStep   Level  3    SPM  80    Minutes  15    METs  3      REL-XR   Level  3    Speed  50    Minutes  15    METs  3.1  Functional Capacity: 6 Minute Walk    Row Name 05/25/17 1353 07/26/17 1803       6 Minute Walk   Phase  -  Discharge    Distance  1030 feet  1288 feet    Distance % Change  -  25 %    Distance Feet Change  -  258 ft    Walk Time  6 minutes  6 minutes    # of Rest Breaks  0  0    MPH  1.95  2.44    METS  1.91  2.47    RPE  11  11    Perceived Dyspnea   1  -    VO2 Peak  6.68  8.6    Symptoms  -  No    Resting HR  74 bpm  81 bpm    Resting BP  108/60  120/60    Resting Oxygen Saturation   94 %  98 %    Exercise Oxygen Saturation  during 6 min walk  95 %  91 %    Max Ex. HR  106 bpm  106 bpm    Max Ex. BP  114/58  124/60    2 Minute Post BP  102/58  -       Quality of Life: Quality of Life - 05/25/17 1327      Quality of Life Scores   Health/Function Pre  21 %    Socioeconomic Pre  21 %    Psych/Spiritual Pre  20.86 %    Family Pre  21 %    GLOBAL Pre  20.97 %       Personal Goals: Goals established at orientation with interventions provided to work toward goal. Personal Goals and Risk Factors at Admission - 05/25/17 1317      Core Components/Risk Factors/Patient Goals on Admission    Weight Management  Yes;Weight Maintenance    Intervention  Weight Management: Develop a combined nutrition and exercise program designed to reach desired caloric intake, while maintaining appropriate intake of nutrient and fiber, sodium  and fats, and appropriate energy expenditure required for the weight goal.;Weight Management: Provide education and appropriate resources to help participant work on and attain dietary goals.    Admit Weight  147 lb (66.7 kg) Stays between 145-147 lb at home and wishes to continue to stay there    Goal Weight: Short Term  145 lb (65.8 kg)    Goal Weight: Long Term  145 lb (65.8 kg)    Expected Outcomes  Short Term: Continue to assess and modify interventions until short term weight is achieved;Long Term: Adherence to nutrition and physical activity/exercise program aimed toward attainment of established weight goal;Weight Maintenance: Understanding of the daily nutrition guidelines, which includes 25-35% calories from fat, 7% or less cal from saturated fats, less than 22m cholesterol, less than 1.5gm of sodium, & 5 or more servings of fruits and vegetables daily;Understanding recommendations for meals to include 15-35% energy as protein, 25-35% energy from fat, 35-60% energy from carbohydrates, less than 2081mof dietary cholesterol, 20-35 gm of total fiber daily;Understanding of distribution of calorie intake throughout the day with the consumption of 4-5 meals/snacks    Diabetes  Yes    Intervention  Provide education about signs/symptoms and action to take for hypo/hyperglycemia.;Provide education about proper nutrition, including hydration, and aerobic/resistive exercise prescription along with prescribed medications to achieve blood glucose in normal ranges: Fasting glucose 65-99 mg/dL    Expected Outcomes  Short Term: Participant verbalizes understanding of the signs/symptoms and immediate care of hyper/hypoglycemia, proper foot care and importance of medication, aerobic/resistive exercise and nutrition plan for blood glucose control.;Long Term: Attainment of HbA1C < 7%.    Heart Failure  Yes    Intervention  Provide a combined exercise and nutrition program that is supplemented with education,  support and counseling about heart failure. Directed toward relieving symptoms such as shortness of breath, decreased exercise tolerance, and extremity edema.    Expected Outcomes  Improve functional capacity of life;Short term: Attendance in program 2-3 days a week with increased exercise capacity. Reported lower sodium intake. Reported increased fruit and vegetable intake. Reports medication compliance.;Short term: Daily weights obtained and reported for increase. Utilizing diuretic protocols set by physician.;Long term: Adoption of self-care skills and reduction of barriers for early signs and symptoms recognition and intervention leading to self-care maintenance.    Hypertension  Yes    Intervention  Provide education on lifestyle modifcations including regular physical activity/exercise, weight management, moderate sodium restriction and increased consumption of fresh fruit, vegetables, and low fat dairy, alcohol moderation, and smoking cessation.;Monitor prescription use compliance.    Expected Outcomes  Short Term: Continued assessment and intervention until BP is < 140/57m HG in hypertensive participants. < 130/860mHG in hypertensive participants with diabetes, heart failure or chronic kidney disease.;Long Term: Maintenance of blood pressure at goal levels.    Lipids  Yes    Intervention  Provide education and support for participant on nutrition & aerobic/resistive exercise along with prescribed medications to achieve LDL <7062mHDL >82m60m  Expected Outcomes  Short Term: Participant states understanding of desired cholesterol values and is compliant with medications prescribed. Participant is following exercise prescription and nutrition guidelines.;Long Term: Cholesterol controlled with medications as prescribed, with individualized exercise RX and with personalized nutrition plan. Value goals: LDL < 70mg6mL > 40 mg.    Stress  Yes He has had a rough couple of years health wise. From cancer  and requiring a trach to a NSTEMI and Heart Failure. MelviDonnytressed about the future and what else could go wrong.     Intervention  Offer individual and/or small group education and counseling on adjustment to heart disease, stress management and health-related lifestyle change. Teach and support self-help strategies.;Refer participants experiencing significant psychosocial distress to appropriate mental health specialists for further evaluation and treatment. When possible, include family members and significant others in education/counseling sessions.    Expected Outcomes  Short Term: Participant demonstrates changes in health-related behavior, relaxation and other stress management skills, ability to obtain effective social support, and compliance with psychotropic medications if prescribed.;Long Term: Emotional wellbeing is indicated by absence of clinically significant psychosocial distress or social isolation.        Personal Goals Discharge: Goals and Risk Factor Review - 07/19/17 1637      Core Components/Risk Factors/Patient Goals Review   Personal Goals Review  Diabetes;Hypertension;Weight Management/Obesity;Lipids    Review  MelviZephaniahinues to have good BP and BG readings.  He notices continued improvement in ease of ADLs.       Expected Outcomes  Short - MelviKody continue to attend class and take meds as directed Long - MelviKyser maintain fitness gains on his own       Exercise Goals and Review: Exercise Goals    Row Name 05/25/17 1352             Exercise Goals   Increase Physical Activity  Yes       Intervention  Provide advice, education, support and counseling about physical activity/exercise needs.;Develop an individualized exercise prescription for aerobic and resistive training based on initial evaluation findings, risk stratification, comorbidities and participant's personal goals.       Expected Outcomes  Achievement of increased cardiorespiratory fitness  and enhanced flexibility, muscular endurance and strength shown through measurements of functional capacity and personal statement of participant.       Increase Strength and Stamina  Yes       Intervention  Provide advice, education, support and counseling about physical activity/exercise needs.;Develop an individualized exercise prescription for aerobic and resistive training based on initial evaluation findings, risk stratification, comorbidities and participant's personal goals.       Expected Outcomes  Achievement of increased cardiorespiratory fitness and enhanced flexibility, muscular endurance and strength shown through measurements of functional capacity and personal statement of participant.       Able to understand and use rate of perceived exertion (RPE) scale  Yes       Intervention  Provide education and explanation on how to use RPE scale       Expected Outcomes  Short Term: Able to use RPE daily in rehab to express subjective intensity level;Long Term:  Able to use RPE to guide intensity level when exercising independently       Able to understand and use Dyspnea scale  Yes       Intervention  Provide education and explanation on how to use Dyspnea scale       Expected Outcomes  Short Term: Able to use Dyspnea scale daily in rehab to express subjective sense of shortness of breath during exertion;Long Term: Able to use Dyspnea scale to guide intensity level when exercising independently       Knowledge and understanding of Target Heart Rate Range (THRR)  Yes       Intervention  Provide education and explanation of THRR including how the numbers were predicted and where they are located for reference       Expected Outcomes  Short Term: Able to state/look up THRR;Long Term: Able to use THRR to govern intensity when exercising independently;Short Term: Able to use daily as guideline for intensity in rehab       Able to check pulse independently  Yes       Intervention  Provide education  and demonstration on how to check pulse in carotid and radial arteries.;Review the importance of being able to check your own pulse for safety during independent exercise       Expected Outcomes  Short Term: Able to explain why pulse checking is important during independent exercise;Long Term: Able to check pulse independently and accurately       Understanding of Exercise Prescription  Yes       Intervention  Provide education, explanation, and written materials on patient's individual exercise prescription       Expected Outcomes  Short Term: Able to explain program exercise prescription;Long Term: Able to explain home exercise prescription to exercise independently          Nutrition & Weight - Outcomes: Pre Biometrics - 05/25/17 1351      Pre Biometrics   Height  5' 6" (1.676 m)    Weight  151 lb (68.5 kg)    Waist Circumference  36 inches    Hip Circumference  37 inches    Waist to Hip Ratio  0.97 %    BMI (Calculated)  24.38    Single Leg Stand  9.35 seconds      Post Biometrics - 07/26/17 1803       Post  Biometrics   Height  5' 6" (1.676 m)    Weight  147 lb (66.7 kg)    Waist Circumference  35 inches    Hip Circumference  37 inches    Waist to Hip Ratio  0.95 %    BMI (Calculated)  23.74    Single Leg Stand  15.58 seconds       Nutrition: Nutrition Therapy & Goals - 05/25/17 1321      Intervention Plan   Intervention  Prescribe, educate and counsel regarding individualized specific dietary modifications aiming towards targeted core components such as weight, hypertension, lipid management, diabetes, heart failure and other comorbidities.;Nutrition handout(s) given to patient.    Expected Outcomes  Short Term Goal: Understand basic principles of dietary content, such as calories, fat, sodium, cholesterol and nutrients.;Short Term Goal: A plan has been developed with personal nutrition goals set during dietitian appointment.;Long Term Goal: Adherence to prescribed  nutrition plan.       Nutrition Discharge: Nutrition Assessments - 06/01/17 1754      MEDFICTS Scores   Pre Score  41       Education Questionnaire Score: Knowledge Questionnaire Score - 05/25/17 1328      Knowledge Questionnaire Score   Pre Score  22/28 Correct answers reviewed with Trilby Drummer        Goals reviewed with patient; copy given to patient.

## 2017-08-02 ENCOUNTER — Encounter: Payer: Self-pay | Admitting: *Deleted

## 2017-08-02 ENCOUNTER — Encounter: Payer: Medicare Other | Admitting: *Deleted

## 2017-08-02 DIAGNOSIS — I214 Non-ST elevation (NSTEMI) myocardial infarction: Secondary | ICD-10-CM

## 2017-08-02 NOTE — Progress Notes (Signed)
Cardiac Individual Treatment Plan  Patient Details  Name: Kerry [name] MRN: [id number] Date of Birth: 11/25/1933 Referring Provider:     Cardiac Rehab from 05/25/2017 in [facility]  Referring Provider  Fath      Initial Encounter Date:    Cardiac Rehab from 05/25/2017 in [facility]  Date  05/25/17  Referring Provider  Fath      Visit Diagnosis: NSTEMI (non-ST elevated myocardial infarction) (HCC)  Patient's Home Medications on Admission:  Current Outpatient Medications:  .  amLODipine (NORVASC) 2.5 MG tablet, Take 2.5 mg by mouth daily. , Disp: , Rfl:  .  aspirin 81 MG chewable tablet, Chew 81 mg by mouth daily. , Disp: , Rfl:  .  atorvastatin (LIPITOR) 80 MG tablet, Take 80 mg by mouth daily. , Disp: , Rfl:  .  Cholecalciferol (VITAMIN D3) 1000 units CAPS, Take 1,000 Units by mouth daily. , Disp: , Rfl:  .  clopidogrel (PLAVIX) 75 MG tablet, Take 75 mg by mouth daily. , Disp: , Rfl:  .  ferrous sulfate 325 (65 FE) MG EC tablet, Take 325 mg by mouth daily with breakfast., Disp: , Rfl:  .  GLIPIZIDE XL 10 MG 24 hr tablet, Take 10 mg by mouth daily with breakfast. , Disp: , Rfl:  .  glucagon (GLUCAGON EMERGENCY) 1 MG injection, as needed. Reported on 06/02/2015, Disp: , Rfl:  .  glucose blood (PRECISION QID TEST) test strip, Accu check Aviva strips, use twice daily, Disp: , Rfl:  .  hydrALAZINE (APRESOLINE) 25 MG tablet, Take 25 mg by mouth 3 (three) times daily. , Disp: , Rfl:  .  isosorbide dinitrate (ISORDIL) 20 MG tablet, Take 20 mg by mouth 3 (three) times daily., Disp: , Rfl:  .  metoprolol succinate (TOPROL-XL) 25 MG 24 hr tablet, Take 25 mg by mouth daily. , Disp: , Rfl:  .  mirtazapine (REMERON) 7.5 MG tablet, Take 7.5 mg by mouth at bedtime. , Disp: , Rfl:  .  nitroGLYCERIN (NITROSTAT) 0.3 MG SL tablet, Place 0.3 mg under the tongue every 5 (five) minutes as needed for chest pain. , Disp: , Rfl:  .  torsemide (DEMADEX) 20 MG  tablet, Take 20 mg by mouth daily. , Disp: , Rfl:   Past Medical History: Past Medical History:  Diagnosis Date  . Cancer (HCC)   . CHF (congestive heart failure) (HCC)   . COPD (chronic obstructive pulmonary disease) (HCC)   . Diabetes mellitus without complication (HCC)   . Hypertension   . Renal disorder     Tobacco Use: Social History   Tobacco Use  Smoking Status Former Smoker  . Packs/day: 0.50  . Years: 20.00  . Pack years: 10.00  . Types: Cigarettes  . Last attempt to quit: 12/05/2014  . Years since quitting: 2.6  Smokeless Tobacco Never Used  Tobacco Comment   Quit 12/2014    Labs: Recent Review Flowsheet Data    There is no flowsheet data to display.       Exercise Target Goals:    Exercise Program Goal: Individual exercise prescription set using results from initial 6 min walk test and THRR while considering  patient's activity barriers and safety.   Exercise Prescription Goal: Initial exercise prescription builds to 30-45 minutes a day of aerobic activity, 2-3 days per week.  Home exercise guidelines will be given to patient during program as part of exercise prescription that the participant will acknowledge.  Activity Barriers &   Risk Stratification: Activity Barriers & Cardiac Risk Stratification - 05/25/17 1331      Activity Barriers & Cardiac Risk Stratification   Activity Barriers  Other (comment);Shortness of Breath    Comments  Patient has a chronic Trach     Cardiac Risk Stratification  High       6 Minute Walk: 6 Minute Walk    Row Name 05/25/17 1353 07/26/17 1803       6 Minute Walk   Phase  -  Discharge    Distance  1030 feet  1288 feet    Distance % Change  -  25 %    Distance Feet Change  -  258 ft    Walk Time  6 minutes  6 minutes    # of Rest Breaks  0  0    MPH  1.95  2.44    METS  1.91  2.47    RPE  11  11    Perceived Dyspnea   1  -    VO2 Peak  6.68  8.6    Symptoms  -  No    Resting HR  74 bpm  81 bpm    Resting  BP  108/60  120/60    Resting Oxygen Saturation   94 %  98 %    Exercise Oxygen Saturation  during 6 min walk  95 %  91 %    Max Ex. HR  106 bpm  106 bpm    Max Ex. BP  114/58  124/60    2 Minute Post BP  102/58  -       Oxygen Initial Assessment:   Oxygen Re-Evaluation:   Oxygen Discharge (Final Oxygen Re-Evaluation):   Initial Exercise Prescription: Initial Exercise Prescription - 05/25/17 1300      Date of Initial Exercise RX and Referring Provider   Date  05/25/17    Referring Provider  Fath      Treadmill   MPH  1.8    Grade  0    Minutes  15    METs  2.38      NuStep   Level  2    SPM  80    Minutes  15    METs  2      Recumbant Elliptical   Level  1    RPM  50    Minutes  15    METs  2      REL-XR   Level  3    Speed  50    Minutes  15    METs  2      Prescription Details   Frequency (times per week)  3    Duration  Progress to 45 minutes of aerobic exercise without signs/symptoms of physical distress      Intensity   THRR 40-80% of Max Heartrate  100-126    Ratings of Perceived Exertion  11-13    Perceived Dyspnea  0-4      Resistance Training   Training Prescription  Yes    Weight  3 lb    Reps  10-15       Perform Capillary Blood Glucose checks as needed.  Exercise Prescription Changes: Exercise Prescription Changes    Row Name 05/25/17 1300 06/14/17 1500 06/28/17 1500 07/25/17 1500       Response to Exercise   Blood Pressure (Admit)  108/60  114/66  102/60  122/60    Blood Pressure (Exercise)  114/58  98/44  118/62  110/50    Blood Pressure (Exit)  102/58  106/62  110/68  -    Heart Rate (Admit)  86 bpm  75 bpm  103 bpm  86 bpm    Heart Rate (Exercise)  106 bpm  103 bpm  113 bpm  99 bpm    Heart Rate (Exit)  82 bpm  75 bpm  82 bpm  86 bpm    Oxygen Saturation (Admit)  94 %  -  -  -    Rating of Perceived Exertion (Exercise)  _0 Symptoms  -  none  none  none    Duration  -  Continue with 45 min of aerobic  exercise without signs/symptoms of physical distress.  Continue with 45 min of aerobic exercise without signs/symptoms of physical distress.  Continue with 45 min of aerobic exercise without signs/symptoms of physical distress.    Intensity  -  THRR unchanged  THRR unchanged  THRR unchanged      Progression   Progression  -  Continue to progress workloads to maintain intensity without signs/symptoms of physical distress.  Continue to progress workloads to maintain intensity without signs/symptoms of physical distress.  Continue to progress workloads to maintain intensity without signs/symptoms of physical distress.    Average METs  -  2.4  2.5  3      Resistance Training   Training Prescription  -  Yes  Yes  Yes    Weight  -  3 lb  3 lb  3 lb    Reps  -  10-15  10-15  10-15      Interval Training   Interval Training  -  No  No  No      Treadmill   MPH  -  1.8  1.8  -    Grade  -  0  0  -    Minutes  -  15  15  -    METs  -  2.38  2.38  -      NuStep   Level  -  3  -  3    SPM  -  80  -  80    Minutes  -  15  -  15    METs  -  2.3  -  3      REL-XR   Level  -  _1 Speed  -  50  50  50    Minutes  -  _2 METs  -  2.5  2.6  3.1       Exercise Comments: Exercise Comments    Row Name 05/31/17 1708 06/19/17 1727 07/12/17 1805 07/19/17 1818     Exercise Comments  First full day of exercise!  Patient was oriented to gym and equipment including functions, settings, policies, and procedures.  Patient's individual exercise prescription and treatment plan were reviewed.  All starting workloads were established based on the results of the 6 minute walk test done at initial orientation visit.  The plan for exercise progression was also introduced and progression will be customized based on patient's performance and goals   Kaycee is returning after a hospital stay - he brought written clearance to return to exercise.  Reviewed home exercise with pt today.  Pt plans to walk   for exercise.  Reviewed THR, pulse, RPE, sign and symptoms, NTG use, and when to call 911 or MD.  Also discussed weather considerations and indoor options.  Pt voiced understanding.  Multiple PVCs including couplets during exercise and sitting during education.        Exercise Goals and Review: Exercise Goals    Row Name 05/25/17 1352             Exercise Goals   Increase Physical Activity  Yes       Intervention  Provide advice, education, support and counseling about physical activity/exercise needs.;Develop an individualized exercise prescription for aerobic and resistive training based on initial evaluation findings, risk stratification, comorbidities and participant's personal goals.       Expected Outcomes  Achievement of increased cardiorespiratory fitness and enhanced flexibility, muscular endurance and strength shown through measurements of functional capacity and personal statement of participant.       Increase Strength and Stamina  Yes       Intervention  Provide advice, education, support and counseling about physical activity/exercise needs.;Develop an individualized exercise prescription for aerobic and resistive training based on initial evaluation findings, risk stratification, comorbidities and participant's personal goals.       Expected Outcomes  Achievement of increased cardiorespiratory fitness and enhanced flexibility, muscular endurance and strength shown through measurements of functional capacity and personal statement of participant.       Able to understand and use rate of perceived exertion (RPE) scale  Yes       Intervention  Provide education and explanation on how to use RPE scale       Expected Outcomes  Short Term: Able to use RPE daily in rehab to express subjective intensity level;Long Term:  Able to use RPE to guide intensity level when exercising independently       Able to understand and use Dyspnea scale  Yes       Intervention  Provide education and  explanation on how to use Dyspnea scale       Expected Outcomes  Short Term: Able to use Dyspnea scale daily in rehab to express subjective sense of shortness of breath during exertion;Long Term: Able to use Dyspnea scale to guide intensity level when exercising independently       Knowledge and understanding of Target Heart Rate Range (THRR)  Yes       Intervention  Provide education and explanation of THRR including how the numbers were predicted and where they are located for reference       Expected Outcomes  Short Term: Able to state/look up THRR;Long Term: Able to use THRR to govern intensity when exercising independently;Short Term: Able to use daily as guideline for intensity in rehab       Able to check pulse independently  Yes       Intervention  Provide education and demonstration on how to check pulse in carotid and radial arteries.;Review the importance of being able to check your own pulse for safety during independent exercise       Expected Outcomes  Short Term: Able to explain why pulse checking is important during independent exercise;Long Term: Able to check pulse independently and accurately       Understanding of Exercise Prescription  Yes       Intervention  Provide education, explanation, and written materials on patient's individual exercise prescription       Expected Outcomes  Short Term: Able to explain program exercise prescription;Long Term: Able to explain home exercise prescription   to exercise independently          Exercise Goals Re-Evaluation : Exercise Goals Re-Evaluation    Row Name 05/31/17 1708 06/14/17 1505 06/28/17 1516 07/12/17 1805 07/25/17 1554     Exercise Goal Re-Evaluation   Exercise Goals Review  Able to understand and use rate of perceived exertion (RPE) scale;Knowledge and understanding of Target Heart Rate Range (THRR);Understanding of Exercise Prescription;Increase Strength and Stamina;Increase Physical Activity  Increase Physical Activity;Increase  Strength and Stamina  Increase Physical Activity;Increase Strength and Stamina;Able to understand and use rate of perceived exertion (RPE) scale  Increase Physical Activity;Increase Strength and Stamina;Able to understand and use rate of perceived exertion (RPE) scale;Able to check pulse independently;Knowledge and understanding of Target Heart Rate Range (THRR);Understanding of Exercise Prescription  Increase Physical Activity;Able to understand and use rate of perceived exertion (RPE) scale;Increase Strength and Stamina   Comments  Reviewed RPE scale, THR and program prescription with pt today.  Pt voiced understanding and was given a copy of goals to take home.   Verlie did well his first 2 sessions.  He is not able to attend today due to being in hospital for fluid overload.  Staff will monitor when he returns.  Jonas has stated that he was unsure about the program on his first visit but has come to enjoy the exercise and comraderie.    Reviewed home exercise with pt today.  Pt plans to walk  for exercise.  Reviewed THR, pulse, RPE, sign and symptoms, NTG use, and when to call 911 or MD.  Also discussed weather considerations and indoor options.  Pt voiced understanding.  Vaishnav is progressing well and has increased average MET level to 3.  Staff will continue to monitor   Expected Outcomes  Short: Use RPE daily to regulate intensity.  Long: Follow program prescription in THR.  Short - Arihaan will be able to return to class.  Long - Beckem will attend regularly.  Short - Finneus will continue to attend Long - Chayim will improve overall MET level  Short - Pt will add one day to program sessions  Long - Pt will exercise on his own  Short - Tori will complete HT program  Long - Seaborn will maintain exercise on his own      Discharge Exercise Prescription (Final Exercise Prescription Changes): Exercise Prescription Changes - 07/25/17 1500      Response to Exercise   Blood Pressure (Admit)  122/60     Blood Pressure (Exercise)  110/50    Heart Rate (Admit)  86 bpm    Heart Rate (Exercise)  99 bpm    Heart Rate (Exit)  86 bpm    Rating of Perceived Exertion (Exercise)  12    Symptoms  none    Duration  Continue with 45 min of aerobic exercise without signs/symptoms of physical distress.    Intensity  THRR unchanged      Progression   Progression  Continue to progress workloads to maintain intensity without signs/symptoms of physical distress.    Average METs  3      Resistance Training   Training Prescription  Yes    Weight  3 lb    Reps  10-15      Interval Training   Interval Training  No      NuStep   Level  3    SPM  80    Minutes  15    METs  3      REL-XR  Level  3    Speed  50    Minutes  15    METs  3.1       Nutrition:  Target Goals: Understanding of nutrition guidelines, daily intake of sodium <1500mg, cholesterol <200mg, calories 30% from fat and 7% or less from saturated fats, daily to have 5 or more servings of fruits and vegetables.  Biometrics: Pre Biometrics - 05/25/17 1351      Pre Biometrics   Height  5' 6" (1.676 m)    Weight  151 lb (68.5 kg)    Waist Circumference  36 inches    Hip Circumference  37 inches    Waist to Hip Ratio  0.97 %    BMI (Calculated)  24.38    Single Leg Stand  9.35 seconds      Post Biometrics - 07/26/17 1803       Post  Biometrics   Height  5' 6" (1.676 m)    Weight  147 lb (66.7 kg)    Waist Circumference  35 inches    Hip Circumference  37 inches    Waist to Hip Ratio  0.95 %    BMI (Calculated)  23.74    Single Leg Stand  15.58 seconds       Nutrition Therapy Plan and Nutrition Goals: Nutrition Therapy & Goals - 05/25/17 1321      Intervention Plan   Intervention  Prescribe, educate and counsel regarding individualized specific dietary modifications aiming towards targeted core components such as weight, hypertension, lipid management, diabetes, heart failure and other comorbidities.;Nutrition  handout(s) given to patient.    Expected Outcomes  Short Term Goal: Understand basic principles of dietary content, such as calories, fat, sodium, cholesterol and nutrients.;Short Term Goal: A plan has been developed with personal nutrition goals set during dietitian appointment.;Long Term Goal: Adherence to prescribed nutrition plan.       Nutrition Assessments: Nutrition Assessments - 06/01/17 1754      MEDFICTS Scores   Pre Score  41       Nutrition Goals Re-Evaluation: Nutrition Goals Re-Evaluation    Row Name 07/19/17 1640             Goals   Comment  Clearance states he understands reading food labels and is paying attention to sodium and sugar and portion sizes       Expected Outcome  Short - Hawley will continue to incorporate the above in his dietary habits  Long - Chane will maintain weight, BG and BP           Nutrition Goals Discharge (Final Nutrition Goals Re-Evaluation): Nutrition Goals Re-Evaluation - 07/19/17 1640      Goals   Comment  Jahquez states he understands reading food labels and is paying attention to sodium and sugar and portion sizes    Expected Outcome  Short - Dailen will continue to incorporate the above in his dietary habits  Long - Dametrius will maintain weight, BG and BP        Psychosocial: Target Goals: Acknowledge presence or absence of significant depression and/or stress, maximize coping skills, provide positive support system. Participant is able to verbalize types and ability to use techniques and skills needed for reducing stress and depression.   Initial Review & Psychosocial Screening: Initial Psych Review & Screening - 05/25/17 1323      Initial Review   Current issues with  Current Anxiety/Panic;Current Sleep Concerns;Current Stress Concerns    Source of Stress Concerns    Chronic Illness;Unable to participate in former interests or hobbies    Comments  e has had a rough couple of years health wise. From cancer and requiring a trach  to a NSTEMI and Heart Failure. Frazer is stressed about the future and what else could go wrong. He may need dialysis some day. Palliative has recently gotten involved in his care and prescribed sleeping medicine that helps sometimes per Carlyn.       Family Dynamics   Good Support System?  Yes Wife and Pastor      Barriers   Psychosocial barriers to participate in program  The patient should benefit from training in stress management and relaxation.      Screening Interventions   Interventions  Yes;Encouraged to exercise;Program counselor consult    Expected Outcomes  Short Term goal: Utilizing psychosocial counselor, staff and physician to assist with identification of specific Stressors or current issues interfering with healing process. Setting desired goal for each stressor or current issue identified.;Long Term Goal: Stressors or current issues are controlled or eliminated.;Short Term goal: Identification and review with participant of any Quality of Life or Depression concerns found by scoring the questionnaire.;Long Term goal: The participant improves quality of Life and PHQ9 Scores as seen by post scores and/or verbalization of changes       Quality of Life Scores:  Quality of Life - 05/25/17 1327      Quality of Life Scores   Health/Function Pre  21 %    Socioeconomic Pre  21 %    Psych/Spiritual Pre  20.86 %    Family Pre  21 %    GLOBAL Pre  20.97 %      Scores of 19 and below usually indicate a poorer quality of life in these areas.  A difference of  2-3 points is a clinically meaningful difference.  A difference of 2-3 points in the total score of the Quality of Life Index has been associated with significant improvement in overall quality of life, self-image, physical symptoms, and general health in studies assessing change in quality of life.  PHQ-9: Recent Review Flowsheet Data    Depression screen PHQ 2/9 05/25/2017   Decreased Interest 1   Down, Depressed, Hopeless 0    PHQ - 2 Score 1   Altered sleeping 0   Tired, decreased energy 3   Change in appetite 2   Feeling bad or failure about yourself  0   Trouble concentrating 0   Moving slowly or fidgety/restless 0   Suicidal thoughts 0   PHQ-9 Score 6   Difficult doing work/chores Somewhat difficult     Interpretation of Total Score  Total Score Depression Severity:  1-4 = Minimal depression, 5-9 = Mild depression, 10-14 = Moderate depression, 15-19 = Moderately severe depression, 20-27 = Severe depression   Psychosocial Evaluation and Intervention: Psychosocial Evaluation - 05/31/17 1713      Psychosocial Evaluation & Interventions   Interventions  Encouraged to exercise with the program and follow exercise prescription    Comments  Counselor met with Mr. Twersky (Mel) for initial psychosocial evaluation.  He is an 83 year old who had a heart attack approx. 6 weeks ago.  He has a strong support system with a spouse of 49 years and active involvement in his local church.  He also has a large family in [place].  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  He was not that difficult   to understand.  He reports sleeping okay with the help of medication; but his appetite has been poor for the past 2-3 months.  Mel denies a history of depression or anxiety or any current symptoms and states his mood is generally positive most of the time.  He reports his health as his primary stressor currently.  He has goals to get stronger overall while in this program.  staff will follow with Mel.    Expected Outcomes  Mel will benefit from consistent exercise to achieve his stated goals.  The educational and psychoeducational components of this program will be helpful in learning more and coping more positively with his cardiac condition.  Staff will follow with Mel    Continue Psychosocial Services   Follow up required by staff       Psychosocial Re-Evaluation: Psychosocial Re-Evaluation     [place] Name 07/24/17 1734             Psychosocial Re-Evaluation   Current issues with  Current Stress Concerns       Comments  Counselor met with Mrs. Hinojos Minette Brine) today who is feeling a great deal of stress with her husband's declining health and his increased need for her to be his primary caregiver.  She reports being tired and not sleeping as well.  Counselor encouraged putting support measures in place for her husband with neighbors and the church family in order for her to be able to get out more often for positive self care.    She also is planning to take advantage of the time Mr. Alfonso Patten is in Cardiac Rehab to exercise herself - by walking outside or in the hospital.  Counselor shared stress management strategies and informed Ms. R that "Self-care is not Self'ish."         Expected Outcomes  Ms. Trice will practice better self-care strategies to be better equipped to provide care for her husband.  She will put supports in place with neighbors and church friends to help with this.         Interventions  Stress management education       Continue Psychosocial Services   Follow up required by staff          Psychosocial Discharge (Final Psychosocial Re-Evaluation): Psychosocial Re-Evaluation - 07/24/17 1734      Psychosocial Re-Evaluation   Current issues with  Current Stress Concerns    Comments  Counselor met with Mrs. Zaino Minette Brine) today who is feeling a great deal of stress with her husband's declining health and his increased need for her to be his primary caregiver.  She reports being tired and not sleeping as well.  Counselor encouraged putting support measures in place for her husband with neighbors and the church family in order for her to be able to get out more often for positive self care.    She also is planning to take advantage of the time Mr. Alfonso Patten is in Cardiac Rehab to exercise herself - by walking outside or in the hospital.  Counselor shared stress management strategies and  informed Ms. R that "Self-care is not Self'ish."      Expected Outcomes  Ms. Frimpong will practice better self-care strategies to be better equipped to provide care for her husband.  She will put supports in place with neighbors and church friends to help with this.      Interventions  Stress management education    Continue Psychosocial Services   Follow up required by staff  Vocational Rehabilitation: Provide vocational rehab assistance to qualifying candidates.   Vocational Rehab Evaluation & Intervention: Vocational Rehab - 05/25/17 1328      Initial Vocational Rehab Evaluation & Intervention   Assessment shows need for Vocational Rehabilitation  No       Education: Education Goals: Education classes will be provided on a variety of topics geared toward better understanding of heart health and risk factor modification. Participant will state understanding/return demonstration of topics presented as noted by education test scores.  Learning Barriers/Preferences: Learning Barriers/Preferences - 05/25/17 1328      Learning Barriers/Preferences   Learning Barriers  None    Learning Preferences  None       Education Topics:  AED/CPR: - Group verbal and written instruction with the use of models to demonstrate the basic use of the AED with the basic ABC's of resuscitation.   Cardiac Rehab from 07/31/2017 in [facility] Cardiac and Pulmonary Rehab  Date  07/17/17  Educator  MA  Instruction Review Code  1- Verbalizes Understanding      General Nutrition Guidelines/Fats and Fiber: -Group instruction provided by verbal, written material, models and posters to present the general guidelines for heart healthy nutrition. Gives an explanation and review of dietary fats and fiber.   Controlling Sodium/Reading Food Labels: -Group verbal and written material supporting the discussion of sodium use in heart healthy nutrition. Review and explanation with models, verbal and written  materials for utilization of the food label.   Cardiac Rehab from 07/31/2017 in [facility] Cardiac and Pulmonary Rehab  Date  07/10/17  Educator  PI  Instruction Review Code  1- Verbalizes Understanding      Exercise Physiology & General Exercise Guidelines: - Group verbal and written instruction with models to review the exercise physiology of the cardiovascular system and associated critical values. Provides general exercise guidelines with specific guidelines to those with heart or lung disease.    Cardiac Rehab from 07/31/2017 in [facility] Cardiac and Pulmonary Rehab  Date  07/24/17  Educator  [facility]  Instruction Review Code  1- Verbalizes Understanding      Aerobic Exercise & Resistance Training: - Gives group verbal and written instruction on the various components of exercise. Focuses on aerobic and resistive training programs and the benefits of this training and how to safely progress through these programs..   Cardiac Rehab from 07/31/2017 in [facility] Cardiac and Pulmonary Rehab  Date  07/31/17  Educator  AS  Instruction Review Code  1- Verbalizes Understanding      Flexibility, Balance, Mind/Body Relaxation: Provides group verbal/written instruction on the benefits of flexibility and balance training, including mind/body exercise modes such as yoga, pilates and tai chi.  Demonstration and skill practice provided.   Stress and Anxiety: - Provides group verbal and written instruction about the health risks of elevated stress and causes of high stress.  Discuss the correlation between heart/lung disease and anxiety and treatment options. Review healthy ways to manage with stress and anxiety.   Cardiac Rehab from 07/31/2017 in [facility] Cardiac and Pulmonary Rehab  Date  06/28/17  Educator  [facility]  Instruction Review Code  1- Verbalizes Understanding      Depression: - Provides group verbal and written instruction on the correlation between heart/lung disease and depressed mood, treatment options, and  the stigmas associated with seeking treatment.   Cardiac Rehab from 07/31/2017 in [facility] Cardiac and Pulmonary Rehab  Date  07/26/17  Educator  [facility]  Instruction Review Code  1- Verbalizes Understanding  Anatomy & Physiology of the Heart: - Group verbal and written instruction and models provide basic cardiac anatomy and physiology, with the coronary electrical and arterial systems. Review of Valvular disease and Heart Failure   Cardiac Rehab from 07/31/2017 in [facility] Cardiac and Pulmonary Rehab  Date  06/26/17  Educator  CE  Instruction Review Code  1- Verbalizes Understanding      Cardiac Procedures: - Group verbal and written instruction to review commonly prescribed medications for heart disease. Reviews the medication, class of the drug, and side effects. Includes the steps to properly store meds and maintain the prescription regimen. (beta blockers and nitrates)   Cardiac Rehab from 07/31/2017 in [facility] Cardiac and Pulmonary Rehab  Date  07/05/17  Educator  [facility]  Instruction Review Code  1- Verbalizes Understanding      Cardiac Medications I: - Group verbal and written instruction to review commonly prescribed medications for heart disease. Reviews the medication, class of the drug, and side effects. Includes the steps to properly store meds and maintain the prescription regimen.   Cardiac Rehab from 07/31/2017 in [facility] Cardiac and Pulmonary Rehab  Date  06/19/17 [Part 2 06/22/27 CE]  Educator  [facility]  Instruction Review Code  1- Verbalizes Understanding      Cardiac Medications II: -Group verbal and written instruction to review commonly prescribed medications for heart disease. Reviews the medication, class of the drug, and side effects. (all other drug classes)    Go Sex-Intimacy & Heart Disease, Get SMART - Goal Setting: - Group verbal and written instruction through game format to discuss heart disease and the return to sexual intimacy. Provides group verbal and written material  to discuss and apply goal setting through the application of the S.M.A.R.T. Method.   Cardiac Rehab from 07/31/2017 in [facility] Cardiac and Pulmonary Rehab  Date  07/05/17  Educator  [facility]  Instruction Review Code  1- Verbalizes Understanding      Other Matters of the Heart: - Provides group verbal, written materials and models to describe Stable Angina and Peripheral Artery. Includes description of the disease process and treatment options available to the cardiac patient.   Exercise & Equipment Safety: - Individual verbal instruction and demonstration of equipment use and safety with use of the equipment.   Cardiac Rehab from 07/31/2017 in [facility] Cardiac and Pulmonary Rehab  Date  05/25/17  Educator  [facility]  Instruction Review Code  1- Verbalizes Understanding      Infection Prevention: - Provides verbal and written material to individual with discussion of infection control including proper hand washing and proper equipment cleaning during exercise session.   Cardiac Rehab from 07/31/2017 in [facility] Cardiac and Pulmonary Rehab  Date  05/25/17  Educator  [facility]  Instruction Review Code  1- Verbalizes Understanding      Falls Prevention: - Provides verbal and written material to individual with discussion of falls prevention and safety.   Cardiac Rehab from 07/31/2017 in [facility] Cardiac and Pulmonary Rehab  Date  05/25/17  Educator  [facility]  Instruction Review Code  1- Verbalizes Understanding      Diabetes: - Individual verbal and written instruction to review signs/symptoms of diabetes, desired ranges of glucose level fasting, after meals and with exercise. Acknowledge that pre and post exercise glucose checks will be done for 3 sessions at entry of program.   Cardiac Rehab from 07/31/2017 in [facility] Cardiac and Pulmonary Rehab  Date  05/25/17  Educator  [facility]  Instruction Review Code  1- Verbalizes Understanding  Know Your Numbers and Risk Factors: -Group verbal and written instruction about  important numbers in your health.  Discussion of what are risk factors and how they play a role in the disease process.  Review of Cholesterol, Blood Pressure, Diabetes, and BMI and the role they play in your overall health.   Sleep Hygiene: -Provides group verbal and written instruction about how sleep can affect your health.  Define sleep hygiene, discuss sleep cycles and impact of sleep habits. Review good sleep hygiene tips.    Cardiac Rehab from 07/31/2017 in [facility] Cardiac and Pulmonary Rehab  Date  07/12/17  Educator  [facility]  Instruction Review Code  1- Verbalizes Understanding      Other: -Provides group and verbal instruction on various topics (see comments)   Knowledge Questionnaire Score: Knowledge Questionnaire Score - 05/25/17 1328      Knowledge Questionnaire Score   Pre Score  22/28 Correct answers reviewed with Trilby Drummer        Core Components/Risk Factors/Patient Goals at Admission: Personal Goals and Risk Factors at Admission - 05/25/17 1317      Core Components/Risk Factors/Patient Goals on Admission    Weight Management  Yes;Weight Maintenance    Intervention  Weight Management: Develop a combined nutrition and exercise program designed to reach desired caloric intake, while maintaining appropriate intake of nutrient and fiber, sodium and fats, and appropriate energy expenditure required for the weight goal.;Weight Management: Provide education and appropriate resources to help participant work on and attain dietary goals.    Admit Weight  147 lb (66.7 kg) Stays between 145-147 lb at home and wishes to continue to stay there    Goal Weight: Short Term  145 lb (65.8 kg)    Goal Weight: Long Term  145 lb (65.8 kg)    Expected Outcomes  Short Term: Continue to assess and modify interventions until short term weight is achieved;Long Term: Adherence to nutrition and physical activity/exercise program aimed toward attainment of established weight goal;Weight Maintenance:  Understanding of the daily nutrition guidelines, which includes 25-35% calories from fat, 7% or less cal from saturated fats, less than 261m cholesterol, less than 1.5gm of sodium, & 5 or more servings of fruits and vegetables daily;Understanding recommendations for meals to include 15-35% energy as protein, 25-35% energy from fat, 35-60% energy from carbohydrates, less than 2067mof dietary cholesterol, 20-35 gm of total fiber daily;Understanding of distribution of calorie intake throughout the day with the consumption of 4-5 meals/snacks    Diabetes  Yes    Intervention  Provide education about signs/symptoms and action to take for hypo/hyperglycemia.;Provide education about proper nutrition, including hydration, and aerobic/resistive exercise prescription along with prescribed medications to achieve blood glucose in normal ranges: Fasting glucose 65-99 mg/dL    Expected Outcomes  Short Term: Participant verbalizes understanding of the signs/symptoms and immediate care of hyper/hypoglycemia, proper foot care and importance of medication, aerobic/resistive exercise and nutrition plan for blood glucose control.;Long Term: Attainment of HbA1C < 7%.    Heart Failure  Yes    Intervention  Provide a combined exercise and nutrition program that is supplemented with education, support and counseling about heart failure. Directed toward relieving symptoms such as shortness of breath, decreased exercise tolerance, and extremity edema.    Expected Outcomes  Improve functional capacity of life;Short term: Attendance in program 2-3 days a week with increased exercise capacity. Reported lower sodium intake. Reported increased fruit and vegetable intake. Reports medication compliance.;Short term: Daily weights obtained and reported for increase.  Utilizing diuretic protocols set by physician.;Long term: Adoption of self-care skills and reduction of barriers for early signs and symptoms recognition and intervention leading  to self-care maintenance.    Hypertension  Yes    Intervention  Provide education on lifestyle modifcations including regular physical activity/exercise, weight management, moderate sodium restriction and increased consumption of fresh fruit, vegetables, and low fat dairy, alcohol moderation, and smoking cessation.;Monitor prescription use compliance.    Expected Outcomes  Short Term: Continued assessment and intervention until BP is < 140/90mm HG in hypertensive participants. < 130/80mm HG in hypertensive participants with diabetes, heart failure or chronic kidney disease.;Long Term: Maintenance of blood pressure at goal levels.    Lipids  Yes    Intervention  Provide education and support for participant on nutrition & aerobic/resistive exercise along with prescribed medications to achieve LDL <70mg, HDL >40mg.    Expected Outcomes  Short Term: Participant states understanding of desired cholesterol values and is compliant with medications prescribed. Participant is following exercise prescription and nutrition guidelines.;Long Term: Cholesterol controlled with medications as prescribed, with individualized exercise RX and with personalized nutrition plan. Value goals: LDL < 70mg, HDL > 40 mg.    Stress  Yes He has had a rough couple of years health wise. From cancer and requiring a trach to a NSTEMI and Heart Failure. Clarence is stressed about the future and what else could go wrong.     Intervention  Offer individual and/or small group education and counseling on adjustment to heart disease, stress management and health-related lifestyle change. Teach and support self-help strategies.;Refer participants experiencing significant psychosocial distress to appropriate mental health specialists for further evaluation and treatment. When possible, include family members and significant others in education/counseling sessions.    Expected Outcomes  Short Term: Participant demonstrates changes in health-related  behavior, relaxation and other stress management skills, ability to obtain effective social support, and compliance with psychotropic medications if prescribed.;Long Term: Emotional wellbeing is indicated by absence of clinically significant psychosocial distress or social isolation.       Core Components/Risk Factors/Patient Goals Review:  Goals and Risk Factor Review    Row Name 06/28/17 1729 07/19/17 1637           Core Components/Risk Factors/Patient Goals Review   Personal Goals Review  Diabetes;Lipids;Hypertension  Diabetes;Hypertension;Weight Management/Obesity;Lipids      Review  Valentino states he is taking meds as prescribed - BP has been good in HT class.  He is motitoring BG at home.  He has noticed he sleeps better and can walk around better without fatigue during ADLS.  He is having some stress due to the unexpected death of his wifes niece  Aram continues to have good BP and BG readings.  He notices continued improvement in ease of ADLs.         Expected Outcomes  Short - Tyrek will use some of the stress management techniques and exercise to manage stress  Long - Mack will use exercise and othe techniques to manage stress long term  Short - Imraan will continue to attend class and take meds as directed Long - Aquila will maintain fitness gains on his own         Core Components/Risk Factors/Patient Goals at Discharge (Final Review):  Goals and Risk Factor Review - 07/19/17 1637      Core Components/Risk Factors/Patient Goals Review   Personal Goals Review  Diabetes;Hypertension;Weight Management/Obesity;Lipids    Review  Kirtis continues to have good BP and BG readings.    He notices continued improvement in ease of ADLs.       Expected Outcomes  Short - Clinton will continue to attend class and take meds as directed Long - Damein will maintain fitness gains on his own       ITP Comments: ITP Comments    Row Name 05/25/17 1302 06/07/17 1037 06/15/17 1412 06/19/17 1727  07/05/17 0642   ITP Comments  Med review completed. Initial ITP created. Diagnosis can be found in [facility] 03/13/17  30 day review. Continue with ITP unless directed changes per Medical Director review.   I called Mrs. Metzer. She reported that Dejohn was short of breath on Tuesday 06/13/2017 so she took him to the [facility] and they admitted him for fluid around his abd and gave him a diuretic. She said they are probably going to discharge him tomorrow from [facility] and the MD told her it was ok for Tajah to return to Cardiac Rehab.    Harshaan is returning after a hospital stay - he brought written clearance to return to exercise.  30 Day review. Continue with ITP unless directed changes per Medical Director review.    Row Name 07/19/17 1814 08/02/17 0608         ITP Comments  Multiple PVC's during Cardiac Rehab this evening. Dashon said he has felt more PVC's the past couple of days in his chest.  Taken to [facility] to get checked out. 4 beat run of PVC's on Monday 09/14/2017 noted also. [place] this evening even just sitting for a while in education.   30 day review. Continue with ITP unless directed changes per Medical Director review.         Comments:  

## 2017-08-02 NOTE — Progress Notes (Signed)
Cardiac Individual Treatment Plan  Patient Details  Name: Kerry [name] MRN: [id number] Date of Birth: 12/13/1933 Referring Provider:     Cardiac Rehab from 05/25/2017 in [facility]  Referring Provider  Fath      Initial Encounter Date:    Cardiac Rehab from 05/25/2017 in [facility]  Date  05/25/17  Referring Provider  Fath      Visit Diagnosis: NSTEMI (non-ST elevated myocardial infarction) (HCC)  Patient's Home Medications on Admission:  Current Outpatient Medications:  .  amLODipine (NORVASC) 2.5 MG tablet, Take 2.5 mg by mouth daily. , Disp: , Rfl:  .  aspirin 81 MG chewable tablet, Chew 81 mg by mouth daily. , Disp: , Rfl:  .  atorvastatin (LIPITOR) 80 MG tablet, Take 80 mg by mouth daily. , Disp: , Rfl:  .  Cholecalciferol (VITAMIN D3) 1000 units CAPS, Take 1,000 Units by mouth daily. , Disp: , Rfl:  .  clopidogrel (PLAVIX) 75 MG tablet, Take 75 mg by mouth daily. , Disp: , Rfl:  .  ferrous sulfate 325 (65 FE) MG EC tablet, Take 325 mg by mouth daily with breakfast., Disp: , Rfl:  .  GLIPIZIDE XL 10 MG 24 hr tablet, Take 10 mg by mouth daily with breakfast. , Disp: , Rfl:  .  glucagon (GLUCAGON EMERGENCY) 1 MG injection, as needed. Reported on 06/02/2015, Disp: , Rfl:  .  glucose blood (PRECISION QID TEST) test strip, Accu check Aviva strips, use twice daily, Disp: , Rfl:  .  hydrALAZINE (APRESOLINE) 25 MG tablet, Take 25 mg by mouth 3 (three) times daily. , Disp: , Rfl:  .  isosorbide dinitrate (ISORDIL) 20 MG tablet, Take 20 mg by mouth 3 (three) times daily., Disp: , Rfl:  .  metoprolol succinate (TOPROL-XL) 25 MG 24 hr tablet, Take 25 mg by mouth daily. , Disp: , Rfl:  .  mirtazapine (REMERON) 7.5 MG tablet, Take 7.5 mg by mouth at bedtime. , Disp: , Rfl:  .  nitroGLYCERIN (NITROSTAT) 0.3 MG SL tablet, Place 0.3 mg under the tongue every 5 (five) minutes as needed for chest pain. , Disp: , Rfl:  .  torsemide (DEMADEX) 20 MG  tablet, Take 20 mg by mouth daily. , Disp: , Rfl:   Past Medical History: Past Medical History:  Diagnosis Date  . Cancer (HCC)   . CHF (congestive heart failure) (HCC)   . COPD (chronic obstructive pulmonary disease) (HCC)   . Diabetes mellitus without complication (HCC)   . Hypertension   . Renal disorder     Tobacco Use: Social History   Tobacco Use  Smoking Status Former Smoker  . Packs/day: 0.50  . Years: 20.00  . Pack years: 10.00  . Types: Cigarettes  . Last attempt to quit: 12/05/2014  . Years since quitting: 2.6  Smokeless Tobacco Never Used  Tobacco Comment   Quit 12/2014    Labs: Recent Review Flowsheet Data    There is no flowsheet data to display.       Exercise Target Goals:    Exercise Program Goal: Individual exercise prescription set using results from initial 6 min walk test and THRR while considering  patient's activity barriers and safety.   Exercise Prescription Goal: Initial exercise prescription builds to 30-45 minutes a day of aerobic activity, 2-3 days per week.  Home exercise guidelines will be given to patient during program as part of exercise prescription that the participant will acknowledge.  Activity Barriers &   Risk Stratification: Activity Barriers & Cardiac Risk Stratification - 05/25/17 1331      Activity Barriers & Cardiac Risk Stratification   Activity Barriers  Other (comment);Shortness of Breath    Comments  Patient has a chronic Trach     Cardiac Risk Stratification  High       6 Minute Walk: 6 Minute Walk    Row Name 05/25/17 1353 07/26/17 1803       6 Minute Walk   Phase  -  Discharge    Distance  1030 feet  1288 feet    Distance % Change  -  25 %    Distance Feet Change  -  258 ft    Walk Time  6 minutes  6 minutes    # of Rest Breaks  0  0    MPH  1.95  2.44    METS  1.91  2.47    RPE  11  11    Perceived Dyspnea   1  -    VO2 Peak  6.68  8.6    Symptoms  -  No    Resting HR  74 bpm  81 bpm    Resting  BP  108/60  120/60    Resting Oxygen Saturation   94 %  98 %    Exercise Oxygen Saturation  during 6 min walk  95 %  91 %    Max Ex. HR  106 bpm  106 bpm    Max Ex. BP  114/58  124/60    2 Minute Post BP  102/58  -       Oxygen Initial Assessment:   Oxygen Re-Evaluation:   Oxygen Discharge (Final Oxygen Re-Evaluation):   Initial Exercise Prescription: Initial Exercise Prescription - 05/25/17 1300      Date of Initial Exercise RX and Referring Provider   Date  05/25/17    Referring Provider  Fath      Treadmill   MPH  1.8    Grade  0    Minutes  15    METs  2.38      NuStep   Level  2    SPM  80    Minutes  15    METs  2      Recumbant Elliptical   Level  1    RPM  50    Minutes  15    METs  2      REL-XR   Level  3    Speed  50    Minutes  15    METs  2      Prescription Details   Frequency (times per week)  3    Duration  Progress to 45 minutes of aerobic exercise without signs/symptoms of physical distress      Intensity   THRR 40-80% of Max Heartrate  100-126    Ratings of Perceived Exertion  11-13    Perceived Dyspnea  0-4      Resistance Training   Training Prescription  Yes    Weight  3 lb    Reps  10-15       Perform Capillary Blood Glucose checks as needed.  Exercise Prescription Changes: Exercise Prescription Changes    Row Name 05/25/17 1300 06/14/17 1500 06/28/17 1500 07/25/17 1500       Response to Exercise   Blood Pressure (Admit)  108/60  114/66  102/60  122/60    Blood Pressure (Exercise)  114/58  98/44  118/62  110/50    Blood Pressure (Exit)  102/58  106/62  110/68  -    Heart Rate (Admit)  86 bpm  75 bpm  103 bpm  86 bpm    Heart Rate (Exercise)  106 bpm  103 bpm  113 bpm  99 bpm    Heart Rate (Exit)  82 bpm  75 bpm  82 bpm  86 bpm    Oxygen Saturation (Admit)  94 %  -  -  -    Rating of Perceived Exertion (Exercise)  _0 Symptoms  -  none  none  none    Duration  -  Continue with 45 min of aerobic  exercise without signs/symptoms of physical distress.  Continue with 45 min of aerobic exercise without signs/symptoms of physical distress.  Continue with 45 min of aerobic exercise without signs/symptoms of physical distress.    Intensity  -  THRR unchanged  THRR unchanged  THRR unchanged      Progression   Progression  -  Continue to progress workloads to maintain intensity without signs/symptoms of physical distress.  Continue to progress workloads to maintain intensity without signs/symptoms of physical distress.  Continue to progress workloads to maintain intensity without signs/symptoms of physical distress.    Average METs  -  2.4  2.5  3      Resistance Training   Training Prescription  -  Yes  Yes  Yes    Weight  -  3 lb  3 lb  3 lb    Reps  -  10-15  10-15  10-15      Interval Training   Interval Training  -  No  No  No      Treadmill   MPH  -  1.8  1.8  -    Grade  -  0  0  -    Minutes  -  15  15  -    METs  -  2.38  2.38  -      NuStep   Level  -  3  -  3    SPM  -  80  -  80    Minutes  -  15  -  15    METs  -  2.3  -  3      REL-XR   Level  -  _1 Speed  -  50  50  50    Minutes  -  _2 METs  -  2.5  2.6  3.1       Exercise Comments: Exercise Comments    Row Name 05/31/17 1708 06/19/17 1727 07/12/17 1805 07/19/17 1818 08/02/17 1726   Exercise Comments  First full day of exercise!  Patient was oriented to gym and equipment including functions, settings, policies, and procedures.  Patient's individual exercise prescription and treatment plan were reviewed.  All starting workloads were established based on the results of the 6 minute walk test done at initial orientation visit.  The plan for exercise progression was also introduced and progression will be customized based on patient's performance and goals   Henryk is returning after a hospital stay - he brought written clearance to return to exercise.  Reviewed home exercise with pt today.  Pt  plans to walk  for exercise.  Reviewed THR, pulse, RPE, sign and symptoms, NTG use, and when to call 911 or MD.  Also discussed weather considerations and indoor options.  Pt voiced understanding.  Multiple PVCs including couplets during exercise and sitting during education.   Kaimani graduated today from  rehab with 36 sessions completed.  Details of the patient's exercise prescription and what He needs to do in order to continue the prescription and progress were discussed with patient.  Patient was given a copy of prescription and goals.  Patient verbalized understanding.  Singleton plans to continue to exercise by independently exercising at home.      Exercise Goals and Review: Exercise Goals    Row Name 05/25/17 1352             Exercise Goals   Increase Physical Activity  Yes       Intervention  Provide advice, education, support and counseling about physical activity/exercise needs.;Develop an individualized exercise prescription for aerobic and resistive training based on initial evaluation findings, risk stratification, comorbidities and participant's personal goals.       Expected Outcomes  Achievement of increased cardiorespiratory fitness and enhanced flexibility, muscular endurance and strength shown through measurements of functional capacity and personal statement of participant.       Increase Strength and Stamina  Yes       Intervention  Provide advice, education, support and counseling about physical activity/exercise needs.;Develop an individualized exercise prescription for aerobic and resistive training based on initial evaluation findings, risk stratification, comorbidities and participant's personal goals.       Expected Outcomes  Achievement of increased cardiorespiratory fitness and enhanced flexibility, muscular endurance and strength shown through measurements of functional capacity and personal statement of participant.       Able to understand and use rate of perceived  exertion (RPE) scale  Yes       Intervention  Provide education and explanation on how to use RPE scale       Expected Outcomes  Short Term: Able to use RPE daily in rehab to express subjective intensity level;Long Term:  Able to use RPE to guide intensity level when exercising independently       Able to understand and use Dyspnea scale  Yes       Intervention  Provide education and explanation on how to use Dyspnea scale       Expected Outcomes  Short Term: Able to use Dyspnea scale daily in rehab to express subjective sense of shortness of breath during exertion;Long Term: Able to use Dyspnea scale to guide intensity level when exercising independently       Knowledge and understanding of Target Heart Rate Range (THRR)  Yes       Intervention  Provide education and explanation of THRR including how the numbers were predicted and where they are located for reference       Expected Outcomes  Short Term: Able to state/look up THRR;Long Term: Able to use THRR to govern intensity when exercising independently;Short Term: Able to use daily as guideline for intensity in rehab       Able to check pulse independently  Yes       Intervention  Provide education and demonstration on how to check pulse in carotid and radial arteries.;Review the importance of being able to check your own pulse for safety during independent exercise       Expected Outcomes  Short Term: Able to explain why pulse checking is important during independent exercise;Long Term: Able  to check pulse independently and accurately       Understanding of Exercise Prescription  Yes       Intervention  Provide education, explanation, and written materials on patient's individual exercise prescription       Expected Outcomes  Short Term: Able to explain program exercise prescription;Long Term: Able to explain home exercise prescription to exercise independently          Exercise Goals Re-Evaluation : Exercise Goals Re-Evaluation    Row Name  05/31/17 1708 06/14/17 1505 06/28/17 1516 07/12/17 1805 07/25/17 1554     Exercise Goal Re-Evaluation   Exercise Goals Review  Able to understand and use rate of perceived exertion (RPE) scale;Knowledge and understanding of Target Heart Rate Range (THRR);Understanding of Exercise Prescription;Increase Strength and Stamina;Increase Physical Activity  Increase Physical Activity;Increase Strength and Stamina  Increase Physical Activity;Increase Strength and Stamina;Able to understand and use rate of perceived exertion (RPE) scale  Increase Physical Activity;Increase Strength and Stamina;Able to understand and use rate of perceived exertion (RPE) scale;Able to check pulse independently;Knowledge and understanding of Target Heart Rate Range (THRR);Understanding of Exercise Prescription  Increase Physical Activity;Able to understand and use rate of perceived exertion (RPE) scale;Increase Strength and Stamina   Comments  Reviewed RPE scale, THR and program prescription with pt today.  Pt voiced understanding and was given a copy of goals to take home.   Lenorris did well his first 2 sessions.  He is not able to attend today due to being in hospital for fluid overload.  Staff will monitor when he returns.  Jasten has stated that he was unsure about the program on his first visit but has come to enjoy the exercise and comraderie.    Reviewed home exercise with pt today.  Pt plans to walk  for exercise.  Reviewed THR, pulse, RPE, sign and symptoms, NTG use, and when to call 911 or MD.  Also discussed weather considerations and indoor options.  Pt voiced understanding.  Zakariye is progressing well and has increased average MET level to 3.  Staff will continue to monitor   Expected Outcomes  Short: Use RPE daily to regulate intensity.  Long: Follow program prescription in THR.  Short - Kratos will be able to return to class.  Long - Jhamari will attend regularly.  Short - Amarie will continue to attend Long - Kristofer will  improve overall MET level  Short - Pt will add one day to program sessions  Long - Pt will exercise on his own  Short - Tildon will complete HT program  Long - Micholas will maintain exercise on his own      Discharge Exercise Prescription (Final Exercise Prescription Changes): Exercise Prescription Changes - 07/25/17 1500      Response to Exercise   Blood Pressure (Admit)  122/60    Blood Pressure (Exercise)  110/50    Heart Rate (Admit)  86 bpm    Heart Rate (Exercise)  99 bpm    Heart Rate (Exit)  86 bpm    Rating of Perceived Exertion (Exercise)  12    Symptoms  none    Duration  Continue with 45 min of aerobic exercise without signs/symptoms of physical distress.    Intensity  THRR unchanged      Progression   Progression  Continue to progress workloads to maintain intensity without signs/symptoms of physical distress.    Average METs  3      Resistance Training   Training Prescription  Yes  Weight  3 lb    Reps  10-15      Interval Training   Interval Training  No      NuStep   Level  3    SPM  80    Minutes  15    METs  3      REL-XR   Level  3    Speed  50    Minutes  15    METs  3.1       Nutrition:  Target Goals: Understanding of nutrition guidelines, daily intake of sodium <1512m, cholesterol <2013m calories 30% from fat and 7% or less from saturated fats, daily to have 5 or more servings of fruits and vegetables.  Biometrics: Pre Biometrics - 05/25/17 1351      Pre Biometrics   Height  _0  (1.676 m)    Weight  151 lb (68.5 kg)    Waist Circumference  36 inches    Hip Circumference  37 inches    Waist to Hip Ratio  0.97 %    BMI (Calculated)  24.38    Single Leg Stand  9.35 seconds      Post Biometrics - 07/26/17 1803       Post  Biometrics   Height  _1  (1.676 m)    Weight  147 lb (66.7 kg)    Waist Circumference  35 inches    Hip Circumference  37 inches    Waist to Hip Ratio  0.95 %    BMI (Calculated)  23.74    Single Leg Stand   15.58 seconds       Nutrition Therapy Plan and Nutrition Goals: Nutrition Therapy & Goals - 05/25/17 1321      Intervention Plan   Intervention  Prescribe, educate and counsel regarding individualized specific dietary modifications aiming towards targeted core components such as weight, hypertension, lipid management, diabetes, heart failure and other comorbidities.;Nutrition handout(s) given to patient.    Expected Outcomes  Short Term Goal: Understand basic principles of dietary content, such as calories, fat, sodium, cholesterol and nutrients.;Short Term Goal: A plan has been developed with personal nutrition goals set during dietitian appointment.;Long Term Goal: Adherence to prescribed nutrition plan.       Nutrition Assessments: Nutrition Assessments - 08/02/17 1747      MEDFICTS Scores   Post Score  12       Nutrition Goals Re-Evaluation: Nutrition Goals Re-Evaluation    Row Name 07/19/17 1640             Goals   Comment  MeDillentates he understands reading food labels and is paying attention to sodium and sugar and portion sizes       Expected Outcome  Short - Sabastien will continue to incorporate the above in his dietary habits  Long - MeRaahimill maintain weight, BG and BP           Nutrition Goals Discharge (Final Nutrition Goals Re-Evaluation): Nutrition Goals Re-Evaluation - 07/19/17 1640      Goals   Comment  MeThedoretates he understands reading food labels and is paying attention to sodium and sugar and portion sizes    Expected Outcome  Short - Benjie will continue to incorporate the above in his dietary habits  Long - [place] maintain weight, BG and BP        Psychosocial: Target Goals: Acknowledge presence or absence of significant depression and/or stress, maximize coping skills, provide positive support system. Participant  is able to verbalize types and ability to use techniques and skills needed for reducing stress and depression.   Initial  Review & Psychosocial Screening: Initial Psych Review & Screening - 05/25/17 1323      Initial Review   Current issues with  Current Anxiety/Panic;Current Sleep Concerns;Current Stress Concerns    Source of Stress Concerns  Chronic Illness;Unable to participate in former interests or hobbies    Comments  e has had a rough couple of years health wise. From cancer and requiring a trach to a NSTEMI and Heart Failure. Greene is stressed about the future and what else could go wrong. He may need dialysis some day. Palliative has recently gotten involved in his care and prescribed sleeping medicine that helps sometimes per [facility].       Family Dynamics   Good Support System?  Yes Wife and Doristine Bosworth      Barriers   Psychosocial barriers to participate in program  The patient should benefit from training in stress management and relaxation.      Screening Interventions   Interventions  Yes;Encouraged to exercise;Program counselor consult    Expected Outcomes  Short Term goal: Utilizing psychosocial counselor, staff and physician to assist with identification of specific Stressors or current issues interfering with healing process. Setting desired goal for each stressor or current issue identified.;Long Term Goal: Stressors or current issues are controlled or eliminated.;Short Term goal: Identification and review with participant of any Quality of Life or Depression concerns found by scoring the questionnaire.;Long Term goal: The participant improves quality of Life and PHQ9 Scores as seen by post scores and/or verbalization of changes       Quality of Life Scores:  Quality of Life - 08/02/17 1747      Quality of Life Scores   Health/Function Post  21 %    Socioeconomic Post  21 %    Psych/Spiritual Post  21 %    Family Post  21 %    GLOBAL Post  21 %      Scores of 19 and below usually indicate a poorer quality of life in these areas.  A difference of  2-3 points is a clinically meaningful  difference.  A difference of 2-3 points in the total score of the Quality of Life Index has been associated with significant improvement in overall quality of life, self-image, physical symptoms, and general health in studies assessing change in quality of life.  PHQ-9: Recent Review Flowsheet Data    Depression screen [facility] 2/9 08/02/2017 05/25/2017   Decreased Interest 2 1   Down, Depressed, Hopeless 0 0   PHQ - 2 Score 2 1   Altered sleeping 0 0   Tired, decreased energy 1 3   Change in appetite 0 2   Feeling bad or failure about yourself  0 0   Trouble concentrating 0 0   Moving slowly or fidgety/restless 0 0   Suicidal thoughts 0 0   PHQ-9 Score 3 6   Difficult doing work/chores Not difficult at all Somewhat difficult     Interpretation of Total Score  Total Score Depression Severity:  1-4 = Minimal depression, 5-9 = Mild depression, 10-14 = Moderate depression, 15-19 = Moderately severe depression, 20-27 = Severe depression   Psychosocial Evaluation and Intervention: Psychosocial Evaluation - 05/31/17 1713      Psychosocial Evaluation & Interventions   Interventions  Encouraged to exercise with the program and follow exercise prescription    Comments  Counselor met  with Mr. Ahles [facility]) for initial psychosocial evaluation.  He is an 84 year old who had a heart attack approx. 6 weeks ago.  He has a strong support system with a spouse of 35 years and active involvement in his local church.  He also has a large family in [place].  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  He was not that difficult to understand.  He reports sleeping okay with the help of medication; but his appetite has been poor for the past 2-3 months.  Mel denies a history of depression or anxiety or any current symptoms and states his mood is generally positive most of the time.  He reports his health as his primary stressor currently.  He has goals to get stronger overall while in  this program.  staff will follow with Mel.    Expected Outcomes  Mel will benefit from consistent exercise to achieve his stated goals.  The educational and psychoeducational components of this program will be helpful in learning more and coping more positively with his cardiac condition.  Staff will follow with Mel    Continue Psychosocial Services   Follow up required by staff       Psychosocial Re-Evaluation: Psychosocial Re-Evaluation    [place] Name 07/24/17 1734             Psychosocial Re-Evaluation   Current issues with  Current Stress Concerns       Comments  Counselor met with Mrs. Schroepfer Minette Brine) today who is feeling a great deal of stress with her husband's declining health and his increased need for her to be his primary caregiver.  She reports being tired and not sleeping as well.  Counselor encouraged putting support measures in place for her husband with neighbors and the church family in order for her to be able to get out more often for positive self care.    She also is planning to take advantage of the time Mr. Alfonso Patten is in Cardiac Rehab to exercise herself - by walking outside or in the hospital.  Counselor shared stress management strategies and informed Ms. R that "Self-care is not Self'ish."         Expected Outcomes  Ms. Arquette will practice better self-care strategies to be better equipped to provide care for her husband.  She will put supports in place with neighbors and church friends to help with this.         Interventions  Stress management education       Continue Psychosocial Services   Follow up required by staff          Psychosocial Discharge (Final Psychosocial Re-Evaluation): Psychosocial Re-Evaluation - 07/24/17 1734      Psychosocial Re-Evaluation   Current issues with  Current Stress Concerns    Comments  Counselor met with Mrs. Dalby Minette Brine) today who is feeling a great deal of stress with her husband's declining health and his increased need for her to  be his primary caregiver.  She reports being tired and not sleeping as well.  Counselor encouraged putting support measures in place for her husband with neighbors and the church family in order for her to be able to get out more often for positive self care.    She also is planning to take advantage of the time Mr. Alfonso Patten is in Cardiac Rehab to exercise herself - by walking outside or in the hospital.  Counselor shared stress  management strategies and informed Ms. R that "Self-care is not Self'ish."      Expected Outcomes  Ms. Holzschuh will practice better self-care strategies to be better equipped to provide care for her husband.  She will put supports in place with neighbors and church friends to help with this.      Interventions  Stress management education    Continue Psychosocial Services   Follow up required by staff       Vocational Rehabilitation: Provide vocational rehab assistance to qualifying candidates.   Vocational Rehab Evaluation & Intervention: Vocational Rehab - 05/25/17 1328      Initial Vocational Rehab Evaluation & Intervention   Assessment shows need for Vocational Rehabilitation  No       Education: Education Goals: Education classes will be provided on a variety of topics geared toward better understanding of heart health and risk factor modification. Participant will state understanding/return demonstration of topics presented as noted by education test scores.  Learning Barriers/Preferences: Learning Barriers/Preferences - 05/25/17 1328      Learning Barriers/Preferences   Learning Barriers  None    Learning Preferences  None       Education Topics:  AED/CPR: - Group verbal and written instruction with the use of models to demonstrate the basic use of the AED with the basic ABC's of resuscitation.   Cardiac Rehab from 08/02/2017 in [facility] Cardiac and Pulmonary Rehab  Date  07/17/17  Educator  MA  Instruction Review Code  1- Verbalizes Understanding       General Nutrition Guidelines/Fats and Fiber: -Group instruction provided by verbal, written material, models and posters to present the general guidelines for heart healthy nutrition. Gives an explanation and review of dietary fats and fiber.   Controlling Sodium/Reading Food Labels: -Group verbal and written material supporting the discussion of sodium use in heart healthy nutrition. Review and explanation with models, verbal and written materials for utilization of the food label.   Cardiac Rehab from 08/02/2017 in [facility] Cardiac and Pulmonary Rehab  Date  07/10/17  Educator  PI  Instruction Review Code  1- Verbalizes Understanding      Exercise Physiology & General Exercise Guidelines: - Group verbal and written instruction with models to review the exercise physiology of the cardiovascular system and associated critical values. Provides general exercise guidelines with specific guidelines to those with heart or lung disease.    Cardiac Rehab from 08/02/2017 in [facility] Cardiac and Pulmonary Rehab  Date  07/24/17  Educator  [facility]  Instruction Review Code  1- Verbalizes Understanding      Aerobic Exercise & Resistance Training: - Gives group verbal and written instruction on the various components of exercise. Focuses on aerobic and resistive training programs and the benefits of this training and how to safely progress through these programs..   Cardiac Rehab from 08/02/2017 in [facility] Cardiac and Pulmonary Rehab  Date  07/31/17  Educator  AS  Instruction Review Code  1- Verbalizes Understanding      Flexibility, Balance, Mind/Body Relaxation: Provides group verbal/written instruction on the benefits of flexibility and balance training, including mind/body exercise modes such as yoga, pilates and tai chi.  Demonstration and skill practice provided.   Cardiac Rehab from 08/02/2017 in [facility] Cardiac and Pulmonary Rehab  Date  08/02/17  Educator  AS  Instruction Review Code  1- Verbalizes  Understanding      Stress and Anxiety: - Provides group verbal and written instruction about the health risks of elevated stress and causes  of high stress.  Discuss the correlation between heart/lung disease and anxiety and treatment options. Review healthy ways to manage with stress and anxiety.   Cardiac Rehab from 08/02/2017 in [facility] Cardiac and Pulmonary Rehab  Date  06/28/17  Educator  [facility]  Instruction Review Code  1- Verbalizes Understanding      Depression: - Provides group verbal and written instruction on the correlation between heart/lung disease and depressed mood, treatment options, and the stigmas associated with seeking treatment.   Cardiac Rehab from 08/02/2017 in [facility] Cardiac and Pulmonary Rehab  Date  07/26/17  Educator  [facility]  Instruction Review Code  1- Verbalizes Understanding      Anatomy & Physiology of the Heart: - Group verbal and written instruction and models provide basic cardiac anatomy and physiology, with the coronary electrical and arterial systems. Review of Valvular disease and Heart Failure   Cardiac Rehab from 08/02/2017 in [facility] Cardiac and Pulmonary Rehab  Date  06/26/17  Educator  CE  Instruction Review Code  1- Verbalizes Understanding      Cardiac Procedures: - Group verbal and written instruction to review commonly prescribed medications for heart disease. Reviews the medication, class of the drug, and side effects. Includes the steps to properly store meds and maintain the prescription regimen. (beta blockers and nitrates)   Cardiac Rehab from 08/02/2017 in [facility] Cardiac and Pulmonary Rehab  Date  07/05/17  Educator  [facility]  Instruction Review Code  1- Verbalizes Understanding      Cardiac Medications I: - Group verbal and written instruction to review commonly prescribed medications for heart disease. Reviews the medication, class of the drug, and side effects. Includes the steps to properly store meds and maintain the prescription regimen.    Cardiac Rehab from 08/02/2017 in [facility] Cardiac and Pulmonary Rehab  Date  06/19/17 [Part 2 06/22/27 CE]  Educator  [facility]  Instruction Review Code  1- Verbalizes Understanding      Cardiac Medications II: -Group verbal and written instruction to review commonly prescribed medications for heart disease. Reviews the medication, class of the drug, and side effects. (all other drug classes)    Go Sex-Intimacy & Heart Disease, Get SMART - Goal Setting: - Group verbal and written instruction through game format to discuss heart disease and the return to sexual intimacy. Provides group verbal and written material to discuss and apply goal setting through the application of the S.M.A.R.T. Method.   Cardiac Rehab from 08/02/2017 in [facility] Cardiac and Pulmonary Rehab  Date  07/05/17  Educator  [facility]  Instruction Review Code  1- Verbalizes Understanding      Other Matters of the Heart: - Provides group verbal, written materials and models to describe Stable Angina and Peripheral Artery. Includes description of the disease process and treatment options available to the cardiac patient.   Exercise & Equipment Safety: - Individual verbal instruction and demonstration of equipment use and safety with use of the equipment.   Cardiac Rehab from 08/02/2017 in [facility] Cardiac and Pulmonary Rehab  Date  05/25/17  Educator  [facility]  Instruction Review Code  1- Verbalizes Understanding      Infection Prevention: - Provides verbal and written material to individual with discussion of infection control including proper hand washing and proper equipment cleaning during exercise session.   Cardiac Rehab from 08/02/2017 in [facility] Cardiac and Pulmonary Rehab  Date  05/25/17  Educator  [facility]  Instruction Review Code  1- Verbalizes Understanding      Falls Prevention: -  Provides verbal and written material to individual with discussion of falls prevention and safety.   Cardiac Rehab from 08/02/2017 in [facility] Cardiac and Pulmonary  Rehab  Date  05/25/17  Educator  [facility]  Instruction Review Code  1- Verbalizes Understanding      Diabetes: - Individual verbal and written instruction to review signs/symptoms of diabetes, desired ranges of glucose level fasting, after meals and with exercise. Acknowledge that pre and post exercise glucose checks will be done for 3 sessions at entry of program.   Cardiac Rehab from 08/02/2017 in [facility] Cardiac and Pulmonary Rehab  Date  05/25/17  Educator  [facility]  Instruction Review Code  1- Verbalizes Understanding      Know Your Numbers and Risk Factors: -Group verbal and written instruction about important numbers in your health.  Discussion of what are risk factors and how they play a role in the disease process.  Review of Cholesterol, Blood Pressure, Diabetes, and BMI and the role they play in your overall health.   Sleep Hygiene: -Provides group verbal and written instruction about how sleep can affect your health.  Define sleep hygiene, discuss sleep cycles and impact of sleep habits. Review good sleep hygiene tips.    Cardiac Rehab from 08/02/2017 in [facility] Cardiac and Pulmonary Rehab  Date  07/12/17  Educator  [facility]  Instruction Review Code  1- Verbalizes Understanding      Other: -Provides group and verbal instruction on various topics (see comments)   Knowledge Questionnaire Score: Knowledge Questionnaire Score - 08/02/17 1748      Knowledge Questionnaire Score   Post Score  28/28       Core Components/Risk Factors/Patient Goals at Admission: Personal Goals and Risk Factors at Admission - 05/25/17 1317      Core Components/Risk Factors/Patient Goals on Admission    Weight Management  Yes;Weight Maintenance    Intervention  Weight Management: Develop a combined nutrition and exercise program designed to reach desired caloric intake, while maintaining appropriate intake of nutrient and fiber, sodium and fats, and appropriate energy expenditure required for the weight  goal.;Weight Management: Provide education and appropriate resources to help participant work on and attain dietary goals.    Admit Weight  147 lb (66.7 kg) Stays between 145-147 lb at home and wishes to continue to stay there    Goal Weight: Short Term  145 lb (65.8 kg)    Goal Weight: Long Term  145 lb (65.8 kg)    Expected Outcomes  Short Term: Continue to assess and modify interventions until short term weight is achieved;Long Term: Adherence to nutrition and physical activity/exercise program aimed toward attainment of established weight goal;Weight Maintenance: Understanding of the daily nutrition guidelines, which includes 25-35% calories from fat, 7% or less cal from saturated fats, less than 252m cholesterol, less than 1.5gm of sodium, & 5 or more servings of fruits and vegetables daily;Understanding recommendations for meals to include 15-35% energy as protein, 25-35% energy from fat, 35-60% energy from carbohydrates, less than 2044mof dietary cholesterol, 20-35 gm of total fiber daily;Understanding of distribution of calorie intake throughout the day with the consumption of 4-5 meals/snacks    Diabetes  Yes    Intervention  Provide education about signs/symptoms and action to take for hypo/hyperglycemia.;Provide education about proper nutrition, including hydration, and aerobic/resistive exercise prescription along with prescribed medications to achieve blood glucose in normal ranges: Fasting glucose 65-99 mg/dL    Expected Outcomes  Short Term: Participant verbalizes understanding of the signs/symptoms and  immediate care of hyper/hypoglycemia, proper foot care and importance of medication, aerobic/resistive exercise and nutrition plan for blood glucose control.;Long Term: Attainment of HbA1C < 7%.    Heart Failure  Yes    Intervention  Provide a combined exercise and nutrition program that is supplemented with education, support and counseling about heart failure. Directed toward relieving  symptoms such as shortness of breath, decreased exercise tolerance, and extremity edema.    Expected Outcomes  Improve functional capacity of life;Short term: Attendance in program 2-3 days a week with increased exercise capacity. Reported lower sodium intake. Reported increased fruit and vegetable intake. Reports medication compliance.;Short term: Daily weights obtained and reported for increase. Utilizing diuretic protocols set by physician.;Long term: Adoption of self-care skills and reduction of barriers for early signs and symptoms recognition and intervention leading to self-care maintenance.    Hypertension  Yes    Intervention  Provide education on lifestyle modifcations including regular physical activity/exercise, weight management, moderate sodium restriction and increased consumption of fresh fruit, vegetables, and low fat dairy, alcohol moderation, and smoking cessation.;Monitor prescription use compliance.    Expected Outcomes  Short Term: Continued assessment and intervention until BP is < 140/60m HG in hypertensive participants. < 130/831mHG in hypertensive participants with diabetes, heart failure or chronic kidney disease.;Long Term: Maintenance of blood pressure at goal levels.    Lipids  Yes    Intervention  Provide education and support for participant on nutrition & aerobic/resistive exercise along with prescribed medications to achieve LDL <7049mHDL >67m41m  Expected Outcomes  Short Term: Participant states understanding of desired cholesterol values and is compliant with medications prescribed. Participant is following exercise prescription and nutrition guidelines.;Long Term: Cholesterol controlled with medications as prescribed, with individualized exercise RX and with personalized nutrition plan. Value goals: LDL < 70mg48mL > 40 mg.    Stress  Yes He has had a rough couple of years health wise. From cancer and requiring a trach to a NSTEMI and Heart Failure. MelviKengotressed  about the future and what else could go wrong.     Intervention  Offer individual and/or small group education and counseling on adjustment to heart disease, stress management and health-related lifestyle change. Teach and support self-help strategies.;Refer participants experiencing significant psychosocial distress to appropriate mental health specialists for further evaluation and treatment. When possible, include family members and significant others in education/counseling sessions.    Expected Outcomes  Short Term: Participant demonstrates changes in health-related behavior, relaxation and other stress management skills, ability to obtain effective social support, and compliance with psychotropic medications if prescribed.;Long Term: Emotional wellbeing is indicated by absence of clinically significant psychosocial distress or social isolation.       Core Components/Risk Factors/Patient Goals Review:  Goals and Risk Factor Review    Row Name 06/28/17 1729 07/19/17 1637           Core Components/Risk Factors/Patient Goals Review   Personal Goals Review  Diabetes;Lipids;Hypertension  Diabetes;Hypertension;Weight Management/Obesity;Lipids      Review  MelviShykeemes he is taking meds as prescribed - BP has been good in HT class.  He is motitoring BG at home.  He has noticed he sleeps better and can walk around better without fatigue during ADLS.  He is having some stress due to the unexpected death of his wifes niece  MelviHageninues to have good BP and BG readings.  He notices continued improvement in ease of ADLs.  Expected Outcomes  Short - Jedrek will use some of the stress management techniques and exercise to manage stress  Long - Cylis will use exercise and othe techniques to manage stress long term  Short - Markeem will continue to attend class and take meds as directed Long - Olin will maintain fitness gains on his own         Core Components/Risk Factors/Patient Goals at  Discharge (Final Review):  Goals and Risk Factor Review - 07/19/17 1637      Core Components/Risk Factors/Patient Goals Review   Personal Goals Review  Diabetes;Hypertension;Weight Management/Obesity;Lipids    Review  Shaul continues to have good BP and BG readings.  He notices continued improvement in ease of ADLs.       Expected Outcomes  Short - Minnie will continue to attend class and take meds as directed Long - Jencarlos will maintain fitness gains on his own       ITP Comments: ITP Comments    Row Name 05/25/17 1302 06/07/17 1037 06/15/17 1412 06/19/17 1727 07/05/17 0642   ITP Comments  Med review completed. Initial ITP created. Diagnosis can be found in [facility] 03/13/17  30 day review. Continue with ITP unless directed changes per Medical Director review.   I called Mrs. Hupfer. She reported that Stavros was short of breath on Tuesday 06/13/2017 so she took him to the [employer] and they admitted him for fluid around his abd and gave him a diuretic. She said they are probably going to discharge him tomorrow from [facility] and the MD told her it was ok for Trellis to return to Cardiac Rehab.    Alioune is returning after a hospital stay - he brought written clearance to return to exercise.  30 Day review. Continue with ITP unless directed changes per Medical Director review.    [place] Name 07/19/17 1814 08/02/17 0608         ITP Comments  Multiple PVC's during Cardiac Rehab this evening. Anyelo said he has felt more PVC's the past couple of days in his chest.  Taken to [facility] to get checked out. 4 beat run of PVC's on Monday 09/14/2017 noted also. Bigemny this evening even just sitting for a while in education.   30 day review. Continue with ITP unless directed changes per Medical Director review.         Comments: Discharge ITP

## 2017-08-02 NOTE — Progress Notes (Deleted)
Cardiac Individual Treatment Plan  Patient Details  Name: Kerry [name] MRN: [id number] Date of Birth: 08/30/1933 Referring Provider:     Cardiac Rehab from 05/25/2017 in [facility]  Referring Provider  Fath      Initial Encounter Date:    Cardiac Rehab from 05/25/2017 in [facility]  Date  05/25/17  Referring Provider  Fath      Visit Diagnosis: NSTEMI (non-ST elevated myocardial infarction) (HCC)  Patient's Home Medications on Admission:  Current Outpatient Medications:  .  amLODipine (NORVASC) 2.5 MG tablet, Take 2.5 mg by mouth daily. , Disp: , Rfl:  .  aspirin 81 MG chewable tablet, Chew 81 mg by mouth daily. , Disp: , Rfl:  .  atorvastatin (LIPITOR) 80 MG tablet, Take 80 mg by mouth daily. , Disp: , Rfl:  .  Cholecalciferol (VITAMIN D3) 1000 units CAPS, Take 1,000 Units by mouth daily. , Disp: , Rfl:  .  clopidogrel (PLAVIX) 75 MG tablet, Take 75 mg by mouth daily. , Disp: , Rfl:  .  ferrous sulfate 325 (65 FE) MG EC tablet, Take 325 mg by mouth daily with breakfast., Disp: , Rfl:  .  GLIPIZIDE XL 10 MG 24 hr tablet, Take 10 mg by mouth daily with breakfast. , Disp: , Rfl:  .  glucagon (GLUCAGON EMERGENCY) 1 MG injection, as needed. Reported on 06/02/2015, Disp: , Rfl:  .  glucose blood (PRECISION QID TEST) test strip, Accu check Aviva strips, use twice daily, Disp: , Rfl:  .  hydrALAZINE (APRESOLINE) 25 MG tablet, Take 25 mg by mouth 3 (three) times daily. , Disp: , Rfl:  .  isosorbide dinitrate (ISORDIL) 20 MG tablet, Take 20 mg by mouth 3 (three) times daily., Disp: , Rfl:  .  metoprolol succinate (TOPROL-XL) 25 MG 24 hr tablet, Take 25 mg by mouth daily. , Disp: , Rfl:  .  mirtazapine (REMERON) 7.5 MG tablet, Take 7.5 mg by mouth at bedtime. , Disp: , Rfl:  .  nitroGLYCERIN (NITROSTAT) 0.3 MG SL tablet, Place 0.3 mg under the tongue every 5 (five) minutes as needed for chest pain. , Disp: , Rfl:  .  torsemide (DEMADEX) 20 MG  tablet, Take 20 mg by mouth daily. , Disp: , Rfl:   Past Medical History: Past Medical History:  Diagnosis Date  . Cancer (HCC)   . CHF (congestive heart failure) (HCC)   . COPD (chronic obstructive pulmonary disease) (HCC)   . Diabetes mellitus without complication (HCC)   . Hypertension   . Renal disorder     Tobacco Use: Social History   Tobacco Use  Smoking Status Former Smoker  . Packs/day: 0.50  . Years: 20.00  . Pack years: 10.00  . Types: Cigarettes  . Last attempt to quit: 12/05/2014  . Years since quitting: 2.6  Smokeless Tobacco Never Used  Tobacco Comment   Quit 12/2014    Labs: Recent Review Flowsheet Data    There is no flowsheet data to display.       Exercise Target Goals:    Exercise Program Goal: Individual exercise prescription set using results from initial 6 min walk test and THRR while considering  patient's activity barriers and safety.   Exercise Prescription Goal: Initial exercise prescription builds to 30-45 minutes a day of aerobic activity, 2-3 days per week.  Home exercise guidelines will be given to patient during program as part of exercise prescription that the participant will acknowledge.  Activity Barriers &   Risk Stratification: Activity Barriers & Cardiac Risk Stratification - 05/25/17 1331      Activity Barriers & Cardiac Risk Stratification   Activity Barriers  Other (comment);Shortness of Breath    Comments  Patient has a chronic Trach     Cardiac Risk Stratification  High       6 Minute Walk: 6 Minute Walk    Row Name 05/25/17 1353 07/26/17 1803       6 Minute Walk   Phase  -  Discharge    Distance  1030 feet  1288 feet    Distance % Change  -  25 %    Distance Feet Change  -  258 ft    Walk Time  6 minutes  6 minutes    # of Rest Breaks  0  0    MPH  1.95  2.44    METS  1.91  2.47    RPE  11  11    Perceived Dyspnea   1  -    VO2 Peak  6.68  8.6    Symptoms  -  No    Resting HR  74 bpm  81 bpm    Resting  BP  108/60  120/60    Resting Oxygen Saturation   94 %  98 %    Exercise Oxygen Saturation  during 6 min walk  95 %  91 %    Max Ex. HR  106 bpm  106 bpm    Max Ex. BP  114/58  124/60    2 Minute Post BP  102/58  -       Oxygen Initial Assessment:   Oxygen Re-Evaluation:   Oxygen Discharge (Final Oxygen Re-Evaluation):   Initial Exercise Prescription: Initial Exercise Prescription - 05/25/17 1300      Date of Initial Exercise RX and Referring Provider   Date  05/25/17    Referring Provider  Fath      Treadmill   MPH  1.8    Grade  0    Minutes  15    METs  2.38      NuStep   Level  2    SPM  80    Minutes  15    METs  2      Recumbant Elliptical   Level  1    RPM  50    Minutes  15    METs  2      REL-XR   Level  3    Speed  50    Minutes  15    METs  2      Prescription Details   Frequency (times per week)  3    Duration  Progress to 45 minutes of aerobic exercise without signs/symptoms of physical distress      Intensity   THRR 40-80% of Max Heartrate  100-126    Ratings of Perceived Exertion  11-13    Perceived Dyspnea  0-4      Resistance Training   Training Prescription  Yes    Weight  3 lb    Reps  10-15       Perform Capillary Blood Glucose checks as needed.  Exercise Prescription Changes: Exercise Prescription Changes    Row Name 05/25/17 1300 06/14/17 1500 06/28/17 1500 07/25/17 1500       Response to Exercise   Blood Pressure (Admit)  108/60  114/66  102/60  122/60    Blood Pressure (Exercise)  114/58  98/44  118/62  110/50    Blood Pressure (Exit)  102/58  106/62  110/68  -    Heart Rate (Admit)  86 bpm  75 bpm  103 bpm  86 bpm    Heart Rate (Exercise)  106 bpm  103 bpm  113 bpm  99 bpm    Heart Rate (Exit)  82 bpm  75 bpm  82 bpm  86 bpm    Oxygen Saturation (Admit)  94 %  -  -  -    Rating of Perceived Exertion (Exercise)  _0 Symptoms  -  none  none  none    Duration  -  Continue with 45 min of aerobic  exercise without signs/symptoms of physical distress.  Continue with 45 min of aerobic exercise without signs/symptoms of physical distress.  Continue with 45 min of aerobic exercise without signs/symptoms of physical distress.    Intensity  -  THRR unchanged  THRR unchanged  THRR unchanged      Progression   Progression  -  Continue to progress workloads to maintain intensity without signs/symptoms of physical distress.  Continue to progress workloads to maintain intensity without signs/symptoms of physical distress.  Continue to progress workloads to maintain intensity without signs/symptoms of physical distress.    Average METs  -  2.4  2.5  3      Resistance Training   Training Prescription  -  Yes  Yes  Yes    Weight  -  3 lb  3 lb  3 lb    Reps  -  10-15  10-15  10-15      Interval Training   Interval Training  -  No  No  No      Treadmill   MPH  -  1.8  1.8  -    Grade  -  0  0  -    Minutes  -  15  15  -    METs  -  2.38  2.38  -      NuStep   Level  -  3  -  3    SPM  -  80  -  80    Minutes  -  15  -  15    METs  -  2.3  -  3      REL-XR   Level  -  _1 Speed  -  50  50  50    Minutes  -  _2 METs  -  2.5  2.6  3.1       Exercise Comments: Exercise Comments    Row Name 05/31/17 1708 06/19/17 1727 07/12/17 1805 07/19/17 1818 08/02/17 1726   Exercise Comments  First full day of exercise!  Patient was oriented to gym and equipment including functions, settings, policies, and procedures.  Patient's individual exercise prescription and treatment plan were reviewed.  All starting workloads were established based on the results of the 6 minute walk test done at initial orientation visit.  The plan for exercise progression was also introduced and progression will be customized based on patient's performance and goals   Henryk is returning after a hospital stay - he brought written clearance to return to exercise.  Reviewed home exercise with pt today.  Pt  plans to walk  for exercise.  Reviewed THR, pulse, RPE, sign and symptoms, NTG use, and when to call 911 or MD.  Also discussed weather considerations and indoor options.  Pt voiced understanding.  Multiple PVCs including couplets during exercise and sitting during education.   Kaimani graduated today from  rehab with 36 sessions completed.  Details of the patient's exercise prescription and what He needs to do in order to continue the prescription and progress were discussed with patient.  Patient was given a copy of prescription and goals.  Patient verbalized understanding.  Singleton plans to continue to exercise by independently exercising at home.      Exercise Goals and Review: Exercise Goals    Row Name 05/25/17 1352             Exercise Goals   Increase Physical Activity  Yes       Intervention  Provide advice, education, support and counseling about physical activity/exercise needs.;Develop an individualized exercise prescription for aerobic and resistive training based on initial evaluation findings, risk stratification, comorbidities and participant's personal goals.       Expected Outcomes  Achievement of increased cardiorespiratory fitness and enhanced flexibility, muscular endurance and strength shown through measurements of functional capacity and personal statement of participant.       Increase Strength and Stamina  Yes       Intervention  Provide advice, education, support and counseling about physical activity/exercise needs.;Develop an individualized exercise prescription for aerobic and resistive training based on initial evaluation findings, risk stratification, comorbidities and participant's personal goals.       Expected Outcomes  Achievement of increased cardiorespiratory fitness and enhanced flexibility, muscular endurance and strength shown through measurements of functional capacity and personal statement of participant.       Able to understand and use rate of perceived  exertion (RPE) scale  Yes       Intervention  Provide education and explanation on how to use RPE scale       Expected Outcomes  Short Term: Able to use RPE daily in rehab to express subjective intensity level;Long Term:  Able to use RPE to guide intensity level when exercising independently       Able to understand and use Dyspnea scale  Yes       Intervention  Provide education and explanation on how to use Dyspnea scale       Expected Outcomes  Short Term: Able to use Dyspnea scale daily in rehab to express subjective sense of shortness of breath during exertion;Long Term: Able to use Dyspnea scale to guide intensity level when exercising independently       Knowledge and understanding of Target Heart Rate Range (THRR)  Yes       Intervention  Provide education and explanation of THRR including how the numbers were predicted and where they are located for reference       Expected Outcomes  Short Term: Able to state/look up THRR;Long Term: Able to use THRR to govern intensity when exercising independently;Short Term: Able to use daily as guideline for intensity in rehab       Able to check pulse independently  Yes       Intervention  Provide education and demonstration on how to check pulse in carotid and radial arteries.;Review the importance of being able to check your own pulse for safety during independent exercise       Expected Outcomes  Short Term: Able to explain why pulse checking is important during independent exercise;Long Term: Able  to check pulse independently and accurately       Understanding of Exercise Prescription  Yes       Intervention  Provide education, explanation, and written materials on patient's individual exercise prescription       Expected Outcomes  Short Term: Able to explain program exercise prescription;Long Term: Able to explain home exercise prescription to exercise independently          Exercise Goals Re-Evaluation : Exercise Goals Re-Evaluation    Row Name  05/31/17 1708 06/14/17 1505 06/28/17 1516 07/12/17 1805 07/25/17 1554     Exercise Goal Re-Evaluation   Exercise Goals Review  Able to understand and use rate of perceived exertion (RPE) scale;Knowledge and understanding of Target Heart Rate Range (THRR);Understanding of Exercise Prescription;Increase Strength and Stamina;Increase Physical Activity  Increase Physical Activity;Increase Strength and Stamina  Increase Physical Activity;Increase Strength and Stamina;Able to understand and use rate of perceived exertion (RPE) scale  Increase Physical Activity;Increase Strength and Stamina;Able to understand and use rate of perceived exertion (RPE) scale;Able to check pulse independently;Knowledge and understanding of Target Heart Rate Range (THRR);Understanding of Exercise Prescription  Increase Physical Activity;Able to understand and use rate of perceived exertion (RPE) scale;Increase Strength and Stamina   Comments  Reviewed RPE scale, THR and program prescription with pt today.  Pt voiced understanding and was given a copy of goals to take home.   Lenorris did well his first 2 sessions.  He is not able to attend today due to being in hospital for fluid overload.  Staff will monitor when he returns.  Jasten has stated that he was unsure about the program on his first visit but has come to enjoy the exercise and comraderie.    Reviewed home exercise with pt today.  Pt plans to walk  for exercise.  Reviewed THR, pulse, RPE, sign and symptoms, NTG use, and when to call 911 or MD.  Also discussed weather considerations and indoor options.  Pt voiced understanding.  Zakariye is progressing well and has increased average MET level to 3.  Staff will continue to monitor   Expected Outcomes  Short: Use RPE daily to regulate intensity.  Long: Follow program prescription in THR.  Short - Kratos will be able to return to class.  Long - Jhamari will attend regularly.  Short - Amarie will continue to attend Long - Kristofer will  improve overall MET level  Short - Pt will add one day to program sessions  Long - Pt will exercise on his own  Short - Tildon will complete HT program  Long - Micholas will maintain exercise on his own      Discharge Exercise Prescription (Final Exercise Prescription Changes): Exercise Prescription Changes - 07/25/17 1500      Response to Exercise   Blood Pressure (Admit)  122/60    Blood Pressure (Exercise)  110/50    Heart Rate (Admit)  86 bpm    Heart Rate (Exercise)  99 bpm    Heart Rate (Exit)  86 bpm    Rating of Perceived Exertion (Exercise)  12    Symptoms  none    Duration  Continue with 45 min of aerobic exercise without signs/symptoms of physical distress.    Intensity  THRR unchanged      Progression   Progression  Continue to progress workloads to maintain intensity without signs/symptoms of physical distress.    Average METs  3      Resistance Training   Training Prescription  Yes  Weight  3 lb    Reps  10-15      Interval Training   Interval Training  No      NuStep   Level  3    SPM  80    Minutes  15    METs  3      REL-XR   Level  3    Speed  50    Minutes  15    METs  3.1       Nutrition:  Target Goals: Understanding of nutrition guidelines, daily intake of sodium <1512m, cholesterol <2013m calories 30% from fat and 7% or less from saturated fats, daily to have 5 or more servings of fruits and vegetables.  Biometrics: Pre Biometrics - 05/25/17 1351      Pre Biometrics   Height  _0  (1.676 m)    Weight  151 lb (68.5 kg)    Waist Circumference  36 inches    Hip Circumference  37 inches    Waist to Hip Ratio  0.97 %    BMI (Calculated)  24.38    Single Leg Stand  9.35 seconds      Post Biometrics - 07/26/17 1803       Post  Biometrics   Height  _1  (1.676 m)    Weight  147 lb (66.7 kg)    Waist Circumference  35 inches    Hip Circumference  37 inches    Waist to Hip Ratio  0.95 %    BMI (Calculated)  23.74    Single Leg Stand   15.58 seconds       Nutrition Therapy Plan and Nutrition Goals: Nutrition Therapy & Goals - 05/25/17 1321      Intervention Plan   Intervention  Prescribe, educate and counsel regarding individualized specific dietary modifications aiming towards targeted core components such as weight, hypertension, lipid management, diabetes, heart failure and other comorbidities.;Nutrition handout(s) given to patient.    Expected Outcomes  Short Term Goal: Understand basic principles of dietary content, such as calories, fat, sodium, cholesterol and nutrients.;Short Term Goal: A plan has been developed with personal nutrition goals set during dietitian appointment.;Long Term Goal: Adherence to prescribed nutrition plan.       Nutrition Assessments: Nutrition Assessments - 08/02/17 1747      MEDFICTS Scores   Post Score  12       Nutrition Goals Re-Evaluation: Nutrition Goals Re-Evaluation    Row Name 07/19/17 1640             Goals   Comment  MeDillentates he understands reading food labels and is paying attention to sodium and sugar and portion sizes       Expected Outcome  Short - Sabastien will continue to incorporate the above in his dietary habits  Long - MeRaahimill maintain weight, BG and BP           Nutrition Goals Discharge (Final Nutrition Goals Re-Evaluation): Nutrition Goals Re-Evaluation - 07/19/17 1640      Goals   Comment  MeThedoretates he understands reading food labels and is paying attention to sodium and sugar and portion sizes    Expected Outcome  Short - Benjie will continue to incorporate the above in his dietary habits  Long - [place] maintain weight, BG and BP        Psychosocial: Target Goals: Acknowledge presence or absence of significant depression and/or stress, maximize coping skills, provide positive support system. Participant  is able to verbalize types and ability to use techniques and skills needed for reducing stress and depression.   Initial  Review & Psychosocial Screening: Initial Psych Review & Screening - 05/25/17 1323      Initial Review   Current issues with  Current Anxiety/Panic;Current Sleep Concerns;Current Stress Concerns    Source of Stress Concerns  Chronic Illness;Unable to participate in former interests or hobbies    Comments  e has had a rough couple of years health wise. From cancer and requiring a trach to a NSTEMI and Heart Failure. Branch is stressed about the future and what else could go wrong. He may need dialysis some day. Palliative has recently gotten involved in his care and prescribed sleeping medicine that helps sometimes per [facility].       Family Dynamics   Good Support System?  Yes Wife and Doristine Bosworth      Barriers   Psychosocial barriers to participate in program  The patient should benefit from training in stress management and relaxation.      Screening Interventions   Interventions  Yes;Encouraged to exercise;Program counselor consult    Expected Outcomes  Short Term goal: Utilizing psychosocial counselor, staff and physician to assist with identification of specific Stressors or current issues interfering with healing process. Setting desired goal for each stressor or current issue identified.;Long Term Goal: Stressors or current issues are controlled or eliminated.;Short Term goal: Identification and review with participant of any Quality of Life or Depression concerns found by scoring the questionnaire.;Long Term goal: The participant improves quality of Life and PHQ9 Scores as seen by post scores and/or verbalization of changes       Quality of Life Scores:  Quality of Life - 08/02/17 1747      Quality of Life Scores   Health/Function Post  21 %    Socioeconomic Post  21 %    Psych/Spiritual Post  21 %    Family Post  21 %    GLOBAL Post  21 %      Scores of 19 and below usually indicate a poorer quality of life in these areas.  A difference of  2-3 points is a clinically meaningful  difference.  A difference of 2-3 points in the total score of the Quality of Life Index has been associated with significant improvement in overall quality of life, self-image, physical symptoms, and general health in studies assessing change in quality of life.  PHQ-9: Recent Review Flowsheet Data    Depression screen [facility] 2/9 05/25/2017   Decreased Interest 1   Down, Depressed, Hopeless 0   PHQ - 2 Score 1   Altered sleeping 0   Tired, decreased energy 3   Change in appetite 2   Feeling bad or failure about yourself  0   Trouble concentrating 0   Moving slowly or fidgety/restless 0   Suicidal thoughts 0   PHQ-9 Score 6   Difficult doing work/chores Somewhat difficult     Interpretation of Total Score  Total Score Depression Severity:  1-4 = Minimal depression, 5-9 = Mild depression, 10-14 = Moderate depression, 15-19 = Moderately severe depression, 20-27 = Severe depression   Psychosocial Evaluation and Intervention: Psychosocial Evaluation - 05/31/17 1713      Psychosocial Evaluation & Interventions   Interventions  Encouraged to exercise with the program and follow exercise prescription    Comments  Counselor met with Mr. [facility]) for initial psychosocial evaluation.  He is an 84 year old who  had a heart attack approx. 6 weeks ago.  He has a strong support system with a spouse of 73 years and active involvement in his local church.  He also has a large family in [place].  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  He was not that difficult to understand.  He reports sleeping okay with the help of medication; but his appetite has been poor for the past 2-3 months.  Mel denies a history of depression or anxiety or any current symptoms and states his mood is generally positive most of the time.  He reports his health as his primary stressor currently.  He has goals to get stronger overall while in this program.  staff will follow with Mel.     Expected Outcomes  Mel will benefit from consistent exercise to achieve his stated goals.  The educational and psychoeducational components of this program will be helpful in learning more and coping more positively with his cardiac condition.  Staff will follow with Mel    Continue Psychosocial Services   Follow up required by staff       Psychosocial Re-Evaluation: Psychosocial Re-Evaluation    [place] Name 07/24/17 1734             Psychosocial Re-Evaluation   Current issues with  Current Stress Concerns       Comments  Counselor met with Mrs. Caldron Minette Brine) today who is feeling a great deal of stress with her husband's declining health and his increased need for her to be his primary caregiver.  She reports being tired and not sleeping as well.  Counselor encouraged putting support measures in place for her husband with neighbors and the church family in order for her to be able to get out more often for positive self care.    She also is planning to take advantage of the time Mr. Alfonso Patten is in Cardiac Rehab to exercise herself - by walking outside or in the hospital.  Counselor shared stress management strategies and informed Ms. R that "Self-care is not Self'ish."         Expected Outcomes  Ms. Choung will practice better self-care strategies to be better equipped to provide care for her husband.  She will put supports in place with neighbors and church friends to help with this.         Interventions  Stress management education       Continue Psychosocial Services   Follow up required by staff          Psychosocial Discharge (Final Psychosocial Re-Evaluation): Psychosocial Re-Evaluation - 07/24/17 1734      Psychosocial Re-Evaluation   Current issues with  Current Stress Concerns    Comments  Counselor met with Mrs. Axel Minette Brine) today who is feeling a great deal of stress with her husband's declining health and his increased need for her to be his primary caregiver.  She reports being  tired and not sleeping as well.  Counselor encouraged putting support measures in place for her husband with neighbors and the church family in order for her to be able to get out more often for positive self care.    She also is planning to take advantage of the time Mr. Alfonso Patten is in Cardiac Rehab to exercise herself - by walking outside or in the hospital.  Counselor shared stress management strategies and informed Ms. R that "Self-care is not Self'ish."  Expected Outcomes  Ms. Aument will practice better self-care strategies to be better equipped to provide care for her husband.  She will put supports in place with neighbors and church friends to help with this.      Interventions  Stress management education    Continue Psychosocial Services   Follow up required by staff       Vocational Rehabilitation: Provide vocational rehab assistance to qualifying candidates.   Vocational Rehab Evaluation & Intervention: Vocational Rehab - 05/25/17 1328      Initial Vocational Rehab Evaluation & Intervention   Assessment shows need for Vocational Rehabilitation  No       Education: Education Goals: Education classes will be provided on a variety of topics geared toward better understanding of heart health and risk factor modification. Participant will state understanding/return demonstration of topics presented as noted by education test scores.  Learning Barriers/Preferences: Learning Barriers/Preferences - 05/25/17 1328      Learning Barriers/Preferences   Learning Barriers  None    Learning Preferences  None       Education Topics:  AED/CPR: - Group verbal and written instruction with the use of models to demonstrate the basic use of the AED with the basic ABC's of resuscitation.   Cardiac Rehab from 08/02/2017 in [facility] Cardiac and Pulmonary Rehab  Date  07/17/17  Educator  MA  Instruction Review Code  1- Verbalizes Understanding      General Nutrition Guidelines/Fats and  Fiber: -Group instruction provided by verbal, written material, models and posters to present the general guidelines for heart healthy nutrition. Gives an explanation and review of dietary fats and fiber.   Controlling Sodium/Reading Food Labels: -Group verbal and written material supporting the discussion of sodium use in heart healthy nutrition. Review and explanation with models, verbal and written materials for utilization of the food label.   Cardiac Rehab from 08/02/2017 in [facility] Cardiac and Pulmonary Rehab  Date  07/10/17  Educator  PI  Instruction Review Code  1- Verbalizes Understanding      Exercise Physiology & General Exercise Guidelines: - Group verbal and written instruction with models to review the exercise physiology of the cardiovascular system and associated critical values. Provides general exercise guidelines with specific guidelines to those with heart or lung disease.    Cardiac Rehab from 08/02/2017 in [facility] Cardiac and Pulmonary Rehab  Date  07/24/17  Educator  [facility]  Instruction Review Code  1- Verbalizes Understanding      Aerobic Exercise & Resistance Training: - Gives group verbal and written instruction on the various components of exercise. Focuses on aerobic and resistive training programs and the benefits of this training and how to safely progress through these programs..   Cardiac Rehab from 08/02/2017 in [facility] Cardiac and Pulmonary Rehab  Date  07/31/17  Educator  AS  Instruction Review Code  1- Verbalizes Understanding      Flexibility, Balance, Mind/Body Relaxation: Provides group verbal/written instruction on the benefits of flexibility and balance training, including mind/body exercise modes such as yoga, pilates and tai chi.  Demonstration and skill practice provided.   Cardiac Rehab from 08/02/2017 in [facility] Cardiac and Pulmonary Rehab  Date  08/02/17  Educator  AS  Instruction Review Code  1- Verbalizes Understanding      Stress and Anxiety: -  Provides group verbal and written instruction about the health risks of elevated stress and causes of high stress.  Discuss the correlation between heart/lung disease and anxiety and treatment options. Review  healthy ways to manage with stress and anxiety.   Cardiac Rehab from 08/02/2017 in [facility] Cardiac and Pulmonary Rehab  Date  06/28/17  Educator  [facility]  Instruction Review Code  1- Verbalizes Understanding      Depression: - Provides group verbal and written instruction on the correlation between heart/lung disease and depressed mood, treatment options, and the stigmas associated with seeking treatment.   Cardiac Rehab from 08/02/2017 in [facility] Cardiac and Pulmonary Rehab  Date  07/26/17  Educator  [facility]  Instruction Review Code  1- Verbalizes Understanding      Anatomy & Physiology of the Heart: - Group verbal and written instruction and models provide basic cardiac anatomy and physiology, with the coronary electrical and arterial systems. Review of Valvular disease and Heart Failure   Cardiac Rehab from 08/02/2017 in [facility] Cardiac and Pulmonary Rehab  Date  06/26/17  Educator  CE  Instruction Review Code  1- Verbalizes Understanding      Cardiac Procedures: - Group verbal and written instruction to review commonly prescribed medications for heart disease. Reviews the medication, class of the drug, and side effects. Includes the steps to properly store meds and maintain the prescription regimen. (beta blockers and nitrates)   Cardiac Rehab from 08/02/2017 in [facility] Cardiac and Pulmonary Rehab  Date  07/05/17  Educator  [facility]  Instruction Review Code  1- Verbalizes Understanding      Cardiac Medications I: - Group verbal and written instruction to review commonly prescribed medications for heart disease. Reviews the medication, class of the drug, and side effects. Includes the steps to properly store meds and maintain the prescription regimen.   Cardiac Rehab from 08/02/2017 in [facility] Cardiac and  Pulmonary Rehab  Date  06/19/17 [Part 2 06/22/27 CE]  Educator  [facility]  Instruction Review Code  1- Verbalizes Understanding      Cardiac Medications II: -Group verbal and written instruction to review commonly prescribed medications for heart disease. Reviews the medication, class of the drug, and side effects. (all other drug classes)    Go Sex-Intimacy & Heart Disease, Get SMART - Goal Setting: - Group verbal and written instruction through game format to discuss heart disease and the return to sexual intimacy. Provides group verbal and written material to discuss and apply goal setting through the application of the S.M.A.R.T. Method.   Cardiac Rehab from 08/02/2017 in [facility] Cardiac and Pulmonary Rehab  Date  07/05/17  Educator  [facility]  Instruction Review Code  1- Verbalizes Understanding      Other Matters of the Heart: - Provides group verbal, written materials and models to describe Stable Angina and Peripheral Artery. Includes description of the disease process and treatment options available to the cardiac patient.   Exercise & Equipment Safety: - Individual verbal instruction and demonstration of equipment use and safety with use of the equipment.   Cardiac Rehab from 08/02/2017 in [facility] Cardiac and Pulmonary Rehab  Date  05/25/17  Educator  [facility]  Instruction Review Code  1- Verbalizes Understanding      Infection Prevention: - Provides verbal and written material to individual with discussion of infection control including proper hand washing and proper equipment cleaning during exercise session.   Cardiac Rehab from 08/02/2017 in [facility] Cardiac and Pulmonary Rehab  Date  05/25/17  Educator  [facility]  Instruction Review Code  1- Verbalizes Understanding      Falls Prevention: - Provides verbal and written material to individual with discussion of falls prevention and safety.  Cardiac Rehab from 08/02/2017 in [facility] Cardiac and Pulmonary Rehab  Date  05/25/17  Educator  [facility]   Instruction Review Code  1- Verbalizes Understanding      Diabetes: - Individual verbal and written instruction to review signs/symptoms of diabetes, desired ranges of glucose level fasting, after meals and with exercise. Acknowledge that pre and post exercise glucose checks will be done for 3 sessions at entry of program.   Cardiac Rehab from 08/02/2017 in [facility] Cardiac and Pulmonary Rehab  Date  05/25/17  Educator  [facility]  Instruction Review Code  1- Verbalizes Understanding      Know Your Numbers and Risk Factors: -Group verbal and written instruction about important numbers in your health.  Discussion of what are risk factors and how they play a role in the disease process.  Review of Cholesterol, Blood Pressure, Diabetes, and BMI and the role they play in your overall health.   Sleep Hygiene: -Provides group verbal and written instruction about how sleep can affect your health.  Define sleep hygiene, discuss sleep cycles and impact of sleep habits. Review good sleep hygiene tips.    Cardiac Rehab from 08/02/2017 in [facility] Cardiac and Pulmonary Rehab  Date  07/12/17  Educator  [facility]  Instruction Review Code  1- Verbalizes Understanding      Other: -Provides group and verbal instruction on various topics (see comments)   Knowledge Questionnaire Score: Knowledge Questionnaire Score - 08/02/17 1748      Knowledge Questionnaire Score   Post Score  28/28       Core Components/Risk Factors/Patient Goals at Admission: Personal Goals and Risk Factors at Admission - 05/25/17 1317      Core Components/Risk Factors/Patient Goals on Admission    Weight Management  Yes;Weight Maintenance    Intervention  Weight Management: Develop a combined nutrition and exercise program designed to reach desired caloric intake, while maintaining appropriate intake of nutrient and fiber, sodium and fats, and appropriate energy expenditure required for the weight goal.;Weight Management: Provide education and  appropriate resources to help participant work on and attain dietary goals.    Admit Weight  147 lb (66.7 kg) Stays between 145-147 lb at home and wishes to continue to stay there    Goal Weight: Short Term  145 lb (65.8 kg)    Goal Weight: Long Term  145 lb (65.8 kg)    Expected Outcomes  Short Term: Continue to assess and modify interventions until short term weight is achieved;Long Term: Adherence to nutrition and physical activity/exercise program aimed toward attainment of established weight goal;Weight Maintenance: Understanding of the daily nutrition guidelines, which includes 25-35% calories from fat, 7% or less cal from saturated fats, less than '200mg'$  cholesterol, less than 1.5gm of sodium, & 5 or more servings of fruits and vegetables daily;Understanding recommendations for meals to include 15-35% energy as protein, 25-35% energy from fat, 35-60% energy from carbohydrates, less than '200mg'$  of dietary cholesterol, 20-35 gm of total fiber daily;Understanding of distribution of calorie intake throughout the day with the consumption of 4-5 meals/snacks    Diabetes  Yes    Intervention  Provide education about signs/symptoms and action to take for hypo/hyperglycemia.;Provide education about proper nutrition, including hydration, and aerobic/resistive exercise prescription along with prescribed medications to achieve blood glucose in normal ranges: Fasting glucose 65-99 mg/dL    Expected Outcomes  Short Term: Participant verbalizes understanding of the signs/symptoms and immediate care of hyper/hypoglycemia, proper foot care and importance of medication, aerobic/resistive exercise and nutrition plan  for blood glucose control.;Long Term: Attainment of HbA1C < 7%.    Heart Failure  Yes    Intervention  Provide a combined exercise and nutrition program that is supplemented with education, support and counseling about heart failure. Directed toward relieving symptoms such as shortness of breath, decreased  exercise tolerance, and extremity edema.    Expected Outcomes  Improve functional capacity of life;Short term: Attendance in program 2-3 days a week with increased exercise capacity. Reported lower sodium intake. Reported increased fruit and vegetable intake. Reports medication compliance.;Short term: Daily weights obtained and reported for increase. Utilizing diuretic protocols set by physician.;Long term: Adoption of self-care skills and reduction of barriers for early signs and symptoms recognition and intervention leading to self-care maintenance.    Hypertension  Yes    Intervention  Provide education on lifestyle modifcations including regular physical activity/exercise, weight management, moderate sodium restriction and increased consumption of fresh fruit, vegetables, and low fat dairy, alcohol moderation, and smoking cessation.;Monitor prescription use compliance.    Expected Outcomes  Short Term: Continued assessment and intervention until BP is < 140/66m HG in hypertensive participants. < 130/896mHG in hypertensive participants with diabetes, heart failure or chronic kidney disease.;Long Term: Maintenance of blood pressure at goal levels.    Lipids  Yes    Intervention  Provide education and support for participant on nutrition & aerobic/resistive exercise along with prescribed medications to achieve LDL '70mg'$ , HDL >'40mg'$ .    Expected Outcomes  Short Term: Participant states understanding of desired cholesterol values and is compliant with medications prescribed. Participant is following exercise prescription and nutrition guidelines.;Long Term: Cholesterol controlled with medications as prescribed, with individualized exercise RX and with personalized nutrition plan. Value goals: LDL < '70mg'$ , HDL > 40 mg.    Stress  Yes He has had a rough couple of years health wise. From cancer and requiring a trach to a NSTEMI and Heart Failure. MeDameres stressed about the future and what else could go wrong.      Intervention  Offer individual and/or small group education and counseling on adjustment to heart disease, stress management and health-related lifestyle change. Teach and support self-help strategies.;Refer participants experiencing significant psychosocial distress to appropriate mental health specialists for further evaluation and treatment. When possible, include family members and significant others in education/counseling sessions.    Expected Outcomes  Short Term: Participant demonstrates changes in health-related behavior, relaxation and other stress management skills, ability to obtain effective social support, and compliance with psychotropic medications if prescribed.;Long Term: Emotional wellbeing is indicated by absence of clinically significant psychosocial distress or social isolation.       Core Components/Risk Factors/Patient Goals Review:  Goals and Risk Factor Review    Row Name 06/28/17 1729 07/19/17 1637           Core Components/Risk Factors/Patient Goals Review   Personal Goals Review  Diabetes;Lipids;Hypertension  Diabetes;Hypertension;Weight Management/Obesity;Lipids      Review  MeKyliltates he is taking meds as prescribed - BP has been good in HT class.  He is motitoring BG at home.  He has noticed he sleeps better and can walk around better without fatigue during ADLS.  He is having some stress due to the unexpected death of his wifes niece  MeDeariusontinues to have good BP and BG readings.  He notices continued improvement in ease of ADLs.         Expected Outcomes  Short - MeListerill use some of the stress management techniques and exercise  to manage stress  Long - Jamarr will use exercise and othe techniques to manage stress long term  Short - Xavion will continue to attend class and take meds as directed Long - Rollin will maintain fitness gains on his own         Core Components/Risk Factors/Patient Goals at Discharge (Final Review):  Goals and Risk Factor  Review - 07/19/17 1637      Core Components/Risk Factors/Patient Goals Review   Personal Goals Review  Diabetes;Hypertension;Weight Management/Obesity;Lipids    Review  Martise continues to have good BP and BG readings.  He notices continued improvement in ease of ADLs.       Expected Outcomes  Short - Quinlan will continue to attend class and take meds as directed Long - Marquize will maintain fitness gains on his own       ITP Comments: ITP Comments    Row Name 05/25/17 1302 06/07/17 1037 06/15/17 1412 06/19/17 1727 07/05/17 0642   ITP Comments  Med review completed. Initial ITP created. Diagnosis can be found in [facility] 03/13/17  30 day review. Continue with ITP unless directed changes per Medical Director review.   I called Mrs. Heinsohn. She reported that Salbador was short of breath on Tuesday 06/13/2017 so she took him to the [facility] and they admitted him for fluid around his abd and gave him a diuretic. She said they are probably going to discharge him tomorrow from [facility] and the MD told her it was ok for Addison to return to Cardiac Rehab.    Dino is returning after a hospital stay - he brought written clearance to return to exercise.  30 Day review. Continue with ITP unless directed changes per Medical Director review.    [place] Name 07/19/17 1814 08/02/17 0608         ITP Comments  Multiple PVC's during Cardiac Rehab this evening. Tarl said he has felt more PVC's the past couple of days in his chest.  Taken to [facility] to get checked out. 4 beat run of PVC's on Monday 09/14/2017 noted also. [place] this evening even just sitting for a while in education.   30 day review. Continue with ITP unless directed changes per Medical Director review.         Comments: Discharge ITP

## 2017-08-02 NOTE — Progress Notes (Deleted)
Discharge Progress Report  Patient Details  Name: Kerry [name] MRN: [id number] Date of Birth: October 03, 1933 Referring Provider:     Cardiac Rehab from 05/25/2017 in [facility]  Referring Provider  Fath       Number of Visits: 79  Reason for Discharge:  Patient reached a stable level of exercise. Patient independent in their exercise. Patient has met program and personal goals.  Smoking History:  Social History   Tobacco Use  Smoking Status Former Smoker  . Packs/day: 0.50  . Years: 20.00  . Pack years: 10.00  . Types: Cigarettes  . Last attempt to quit: 12/05/2014  . Years since quitting: 2.6  Smokeless Tobacco Never Used  Tobacco Comment   Quit 12/2014    Diagnosis:  NSTEMI (non-ST elevated myocardial infarction) ([place])  ADL [facility]:   Initial Exercise Prescription: Initial Exercise Prescription - 05/25/17 1300      Date of Initial Exercise RX and Referring Provider   Date  05/25/17    Referring Provider  Fath      Treadmill   MPH  1.8    Grade  0    Minutes  15    METs  2.38      NuStep   Level  2    SPM  80    Minutes  15    METs  2      Recumbant Elliptical   Level  1    RPM  50    Minutes  15    METs  2      REL-XR   Level  3    Speed  50    Minutes  15    METs  2      Prescription Details   Frequency (times per week)  3    Duration  Progress to 45 minutes of aerobic exercise without signs/symptoms of physical distress      Intensity   THRR 40-80% of Max Heartrate  100-126    Ratings of Perceived Exertion  11-13    Perceived Dyspnea  0-4      Resistance Training   Training Prescription  Yes    Weight  3 lb    Reps  10-15       Discharge Exercise Prescription (Final Exercise Prescription Changes): Exercise Prescription Changes - 07/25/17 1500      Response to Exercise   Blood Pressure (Admit)  122/60    Blood Pressure (Exercise)  110/50    Heart Rate (Admit)  86 bpm    Heart Rate (Exercise)  99 bpm     Heart Rate (Exit)  86 bpm    Rating of Perceived Exertion (Exercise)  12    Symptoms  none    Duration  Continue with 45 min of aerobic exercise without signs/symptoms of physical distress.    Intensity  THRR unchanged      Progression   Progression  Continue to progress workloads to maintain intensity without signs/symptoms of physical distress.    Average METs  3      Resistance Training   Training Prescription  Yes    Weight  3 lb    Reps  10-15      Interval Training   Interval Training  No      NuStep   Level  3    SPM  80    Minutes  15    METs  3      REL-XR   Level  3    Speed  50    Minutes  15    METs  3.1       Functional Capacity: 6 Minute Walk    Row Name 05/25/17 1353 07/26/17 1803       6 Minute Walk   Phase  -  Discharge    Distance  1030 feet  1288 feet    Distance % Change  -  25 %    Distance Feet Change  -  258 ft    Walk Time  6 minutes  6 minutes    # of Rest Breaks  0  0    MPH  1.95  2.44    METS  1.91  2.47    RPE  11  11    Perceived Dyspnea   1  -    VO2 Peak  6.68  8.6    Symptoms  -  No    Resting HR  74 bpm  81 bpm    Resting BP  108/60  120/60    Resting Oxygen Saturation   94 %  98 %    Exercise Oxygen Saturation  during 6 min walk  95 %  91 %    Max Ex. HR  106 bpm  106 bpm    Max Ex. BP  114/58  124/60    2 Minute Post BP  102/58  -       Psychological, QOL, Others - Outcomes: PHQ 2/9: Depression screen PHQ 2/9 05/25/2017  Decreased Interest 1  Down, Depressed, Hopeless 0  PHQ - 2 Score 1  Altered sleeping 0  Tired, decreased energy 3  Change in appetite 2  Feeling bad or failure about yourself  0  Trouble concentrating 0  Moving slowly or fidgety/restless 0  Suicidal thoughts 0  PHQ-9 Score 6  Difficult doing work/chores Somewhat difficult    Quality of Life: Quality of Life - 08/02/17 1747      Quality of Life Scores   Health/Function Post  21 %    Socioeconomic Post  21 %    Psych/Spiritual Post   21 %    Family Post  21 %    GLOBAL Post  21 %       Personal Goals: Goals established at orientation with interventions provided to work toward goal. Personal Goals and Risk Factors at Admission - 05/25/17 1317      Core Components/Risk Factors/Patient Goals on Admission    Weight Management  Yes;Weight Maintenance    Intervention  Weight Management: Develop a combined nutrition and exercise program designed to reach desired caloric intake, while maintaining appropriate intake of nutrient and fiber, sodium and fats, and appropriate energy expenditure required for the weight goal.;Weight Management: Provide education and appropriate resources to help participant work on and attain dietary goals.    Admit Weight  147 lb (66.7 kg) Stays between 145-147 lb at home and wishes to continue to stay there    Goal Weight: Short Term  145 lb (65.8 kg)    Goal Weight: Long Term  145 lb (65.8 kg)    Expected Outcomes  Short Term: Continue to assess and modify interventions until short term weight is achieved;Long Term: Adherence to nutrition and physical activity/exercise program aimed toward attainment of established weight goal;Weight Maintenance: Understanding of the daily nutrition guidelines, which includes 25-35% calories from fat, 7% or less cal from saturated fats, less than '200mg'$  cholesterol, less than 1.5gm of sodium, & 5  or more servings of fruits and vegetables daily;Understanding recommendations for meals to include 15-35% energy as protein, 25-35% energy from fat, 35-60% energy from carbohydrates, less than '200mg'$  of dietary cholesterol, 20-35 gm of total fiber daily;Understanding of distribution of calorie intake throughout the day with the consumption of 4-5 meals/snacks    Diabetes  Yes    Intervention  Provide education about signs/symptoms and action to take for hypo/hyperglycemia.;Provide education about proper nutrition, including hydration, and aerobic/resistive exercise prescription along  with prescribed medications to achieve blood glucose in normal ranges: Fasting glucose 65-99 mg/dL    Expected Outcomes  Short Term: Participant verbalizes understanding of the signs/symptoms and immediate care of hyper/hypoglycemia, proper foot care and importance of medication, aerobic/resistive exercise and nutrition plan for blood glucose control.;Long Term: Attainment of HbA1C < 7%.    Heart Failure  Yes    Intervention  Provide a combined exercise and nutrition program that is supplemented with education, support and counseling about heart failure. Directed toward relieving symptoms such as shortness of breath, decreased exercise tolerance, and extremity edema.    Expected Outcomes  Improve functional capacity of life;Short term: Attendance in program 2-3 days a week with increased exercise capacity. Reported lower sodium intake. Reported increased fruit and vegetable intake. Reports medication compliance.;Short term: Daily weights obtained and reported for increase. Utilizing diuretic protocols set by physician.;Long term: Adoption of self-care skills and reduction of barriers for early signs and symptoms recognition and intervention leading to self-care maintenance.    Hypertension  Yes    Intervention  Provide education on lifestyle modifcations including regular physical activity/exercise, weight management, moderate sodium restriction and increased consumption of fresh fruit, vegetables, and low fat dairy, alcohol moderation, and smoking cessation.;Monitor prescription use compliance.    Expected Outcomes  Short Term: Continued assessment and intervention until BP is < 140/31m HG in hypertensive participants. < 130/887mHG in hypertensive participants with diabetes, heart failure or chronic kidney disease.;Long Term: Maintenance of blood pressure at goal levels.    Lipids  Yes    Intervention  Provide education and support for participant on nutrition & aerobic/resistive exercise along with  prescribed medications to achieve LDL '70mg'$ , HDL >'40mg'$ .    Expected Outcomes  Short Term: Participant states understanding of desired cholesterol values and is compliant with medications prescribed. Participant is following exercise prescription and nutrition guidelines.;Long Term: Cholesterol controlled with medications as prescribed, with individualized exercise RX and with personalized nutrition plan. Value goals: LDL < '70mg'$ , HDL > 40 mg.    Stress  Yes He has had a rough couple of years health wise. From cancer and requiring a trach to a NSTEMI and Heart Failure. MeOlons stressed about the future and what else could go wrong.     Intervention  Offer individual and/or small group education and counseling on adjustment to heart disease, stress management and health-related lifestyle change. Teach and support self-help strategies.;Refer participants experiencing significant psychosocial distress to appropriate mental health specialists for further evaluation and treatment. When possible, include family members and significant others in education/counseling sessions.    Expected Outcomes  Short Term: Participant demonstrates changes in health-related behavior, relaxation and other stress management skills, ability to obtain effective social support, and compliance with psychotropic medications if prescribed.;Long Term: Emotional wellbeing is indicated by absence of clinically significant psychosocial distress or social isolation.        Personal Goals Discharge: Goals and Risk Factor Review    Row Name 06/28/17 1729 07/19/17 1637  Core Components/Risk Factors/Patient Goals Review   Personal Goals Review  Diabetes;Lipids;Hypertension  Diabetes;Hypertension;Weight Management/Obesity;Lipids      Review  Lendell states he is taking meds as prescribed - BP has been good in HT class.  He is motitoring BG at home.  He has noticed he sleeps better and can walk around better without fatigue during  ADLS.  He is having some stress due to the unexpected death of his wifes niece  Carston continues to have good BP and BG readings.  He notices continued improvement in ease of ADLs.         Expected Outcomes  Short - Esau will use some of the stress management techniques and exercise to manage stress  Long - Betty will use exercise and othe techniques to manage stress long term  Short - Hamzah will continue to attend class and take meds as directed Long - Shahzain will maintain fitness gains on his own         Exercise Goals and Review: Exercise Goals    Row Name 05/25/17 1352             Exercise Goals   Increase Physical Activity  Yes       Intervention  Provide advice, education, support and counseling about physical activity/exercise needs.;Develop an individualized exercise prescription for aerobic and resistive training based on initial evaluation findings, risk stratification, comorbidities and participant's personal goals.       Expected Outcomes  Achievement of increased cardiorespiratory fitness and enhanced flexibility, muscular endurance and strength shown through measurements of functional capacity and personal statement of participant.       Increase Strength and Stamina  Yes       Intervention  Provide advice, education, support and counseling about physical activity/exercise needs.;Develop an individualized exercise prescription for aerobic and resistive training based on initial evaluation findings, risk stratification, comorbidities and participant's personal goals.       Expected Outcomes  Achievement of increased cardiorespiratory fitness and enhanced flexibility, muscular endurance and strength shown through measurements of functional capacity and personal statement of participant.       Able to understand and use rate of perceived exertion (RPE) scale  Yes       Intervention  Provide education and explanation on how to use RPE scale       Expected Outcomes  Short Term:  Able to use RPE daily in rehab to express subjective intensity level;Long Term:  Able to use RPE to guide intensity level when exercising independently       Able to understand and use Dyspnea scale  Yes       Intervention  Provide education and explanation on how to use Dyspnea scale       Expected Outcomes  Short Term: Able to use Dyspnea scale daily in rehab to express subjective sense of shortness of breath during exertion;Long Term: Able to use Dyspnea scale to guide intensity level when exercising independently       Knowledge and understanding of Target Heart Rate Range (THRR)  Yes       Intervention  Provide education and explanation of THRR including how the numbers were predicted and where they are located for reference       Expected Outcomes  Short Term: Able to state/look up THRR;Long Term: Able to use THRR to govern intensity when exercising independently;Short Term: Able to use daily as guideline for intensity in rehab       Able to check pulse independently  Yes       Intervention  Provide education and demonstration on how to check pulse in carotid and radial arteries.;Review the importance of being able to check your own pulse for safety during independent exercise       Expected Outcomes  Short Term: Able to explain why pulse checking is important during independent exercise;Long Term: Able to check pulse independently and accurately       Understanding of Exercise Prescription  Yes       Intervention  Provide education, explanation, and written materials on patient's individual exercise prescription       Expected Outcomes  Short Term: Able to explain program exercise prescription;Long Term: Able to explain home exercise prescription to exercise independently          Nutrition & Weight - Outcomes: Pre Biometrics - 05/25/17 1351      Pre Biometrics   Height  '5\' 6"'$  (1.676 m)    Weight  151 lb (68.5 kg)    Waist Circumference  36 inches    Hip Circumference  37 inches     Waist to Hip Ratio  0.97 %    BMI (Calculated)  24.38    Single Leg Stand  9.35 seconds      Post Biometrics - 07/26/17 1803       Post  Biometrics   Height  '5\' 6"'$  (1.676 m)    Weight  147 lb (66.7 kg)    Waist Circumference  35 inches    Hip Circumference  37 inches    Waist to Hip Ratio  0.95 %    BMI (Calculated)  23.74    Single Leg Stand  15.58 seconds       Nutrition: Nutrition Therapy & Goals - 05/25/17 1321      Intervention Plan   Intervention  Prescribe, educate and counsel regarding individualized specific dietary modifications aiming towards targeted core components such as weight, hypertension, lipid management, diabetes, heart failure and other comorbidities.;Nutrition handout(s) given to patient.    Expected Outcomes  Short Term Goal: Understand basic principles of dietary content, such as calories, fat, sodium, cholesterol and nutrients.;Short Term Goal: A plan has been developed with personal nutrition goals set during dietitian appointment.;Long Term Goal: Adherence to prescribed nutrition plan.       Nutrition Discharge: Nutrition Assessments - 08/02/17 1747      MEDFICTS Scores   Post Score  12       Education Questionnaire Score: Knowledge Questionnaire Score - 08/02/17 1748      Knowledge Questionnaire Score   Post Score  28/28       Goals reviewed with patient; copy given to patient.

## 2017-08-02 NOTE — Progress Notes (Signed)
Daily Session Note  Patient Details  Name: Kerry [name] MRN: [id number] Date of Birth: June 05, 1934 Referring Provider:     Cardiac Rehab from 05/25/2017 in [facility]  Referring Provider  Fath      Encounter Date: 08/02/2017  Check In: Session Check In - 08/02/17 1724      Check-In   Location  [facility]-Cardiac & Pulmonary Rehab    Staff Present  Renita Papa, RN Vickki Hearing, BA, ACSM CEP, Exercise Physiologist;Carroll Enterkin, RN, BSN    Supervising physician immediately available to respond to emergencies  See telemetry face sheet for immediately available ER MD    Medication changes reported      No    Fall or balance concerns reported     No    Warm-up and Cool-down  Performed on first and last piece of equipment    Resistance Training Performed  Yes    VAD Patient?  No      Pain Assessment   Currently in Pain?  No/denies          Social History   Tobacco Use  Smoking Status Former Smoker  . Packs/day: 0.50  . Years: 20.00  . Pack years: 10.00  . Types: Cigarettes  . Last attempt to quit: 12/05/2014  . Years since quitting: 2.6  Smokeless Tobacco Never Used  Tobacco Comment   Quit 12/2014    Goals Met:  Independence with exercise equipment Exercise tolerated well No report of cardiac concerns or symptoms Strength training completed today  Goals Unmet:  Not Applicable  Comments:  Atom graduated today from  rehab with 36 sessions completed.  Details of the patient's exercise prescription and what He needs to do in order to continue the prescription and progress were discussed with patient.  Patient was given a copy of prescription and goals.  Patient verbalized understanding.  Avid plans to continue to exercise by independently exercising at home.    Dr. Emily Filbert is Medical Director for [place] [facility].

## 2017-08-02 NOTE — Progress Notes (Signed)
Discharge Progress Report  Patient Details  Name: Kerry [name] MRN: [id number] Date of Birth: October 03, 1933 Referring Provider:     Cardiac Rehab from 05/25/2017 in [facility]  Referring Provider  Fath       Number of Visits: 79  Reason for Discharge:  Patient reached a stable level of exercise. Patient independent in their exercise. Patient has met program and personal goals.  Smoking History:  Social History   Tobacco Use  Smoking Status Former Smoker  . Packs/day: 0.50  . Years: 20.00  . Pack years: 10.00  . Types: Cigarettes  . Last attempt to quit: 12/05/2014  . Years since quitting: 2.6  Smokeless Tobacco Never Used  Tobacco Comment   Quit 12/2014    Diagnosis:  NSTEMI (non-ST elevated myocardial infarction) ([place])  ADL UCSD:   Initial Exercise Prescription: Initial Exercise Prescription - 05/25/17 1300      Date of Initial Exercise RX and Referring Provider   Date  05/25/17    Referring Provider  Fath      Treadmill   MPH  1.8    Grade  0    Minutes  15    METs  2.38      NuStep   Level  2    SPM  80    Minutes  15    METs  2      Recumbant Elliptical   Level  1    RPM  50    Minutes  15    METs  2      REL-XR   Level  3    Speed  50    Minutes  15    METs  2      Prescription Details   Frequency (times per week)  3    Duration  Progress to 45 minutes of aerobic exercise without signs/symptoms of physical distress      Intensity   THRR 40-80% of Max Heartrate  100-126    Ratings of Perceived Exertion  11-13    Perceived Dyspnea  0-4      Resistance Training   Training Prescription  Yes    Weight  3 lb    Reps  10-15       Discharge Exercise Prescription (Final Exercise Prescription Changes): Exercise Prescription Changes - 07/25/17 1500      Response to Exercise   Blood Pressure (Admit)  122/60    Blood Pressure (Exercise)  110/50    Heart Rate (Admit)  86 bpm    Heart Rate (Exercise)  99 bpm     Heart Rate (Exit)  86 bpm    Rating of Perceived Exertion (Exercise)  12    Symptoms  none    Duration  Continue with 45 min of aerobic exercise without signs/symptoms of physical distress.    Intensity  THRR unchanged      Progression   Progression  Continue to progress workloads to maintain intensity without signs/symptoms of physical distress.    Average METs  3      Resistance Training   Training Prescription  Yes    Weight  3 lb    Reps  10-15      Interval Training   Interval Training  No      NuStep   Level  3    SPM  80    Minutes  15    METs  3      REL-XR   Level  3    Speed  50    Minutes  15    METs  3.1       Functional Capacity: 6 Minute Walk    Row Name 05/25/17 1353 07/26/17 1803       6 Minute Walk   Phase  -  Discharge    Distance  1030 feet  1288 feet    Distance % Change  -  25 %    Distance Feet Change  -  258 ft    Walk Time  6 minutes  6 minutes    # of Rest Breaks  0  0    MPH  1.95  2.44    METS  1.91  2.47    RPE  11  11    Perceived Dyspnea   1  -    VO2 Peak  6.68  8.6    Symptoms  -  No    Resting HR  74 bpm  81 bpm    Resting BP  108/60  120/60    Resting Oxygen Saturation   94 %  98 %    Exercise Oxygen Saturation  during 6 min walk  95 %  91 %    Max Ex. HR  106 bpm  106 bpm    Max Ex. BP  114/58  124/60    2 Minute Post BP  102/58  -       Psychological, QOL, Others - Outcomes: PHQ 2/9: Depression screen [facility] 2/9 08/02/2017 05/25/2017  Decreased Interest 2 1  Down, Depressed, Hopeless 0 0  PHQ - 2 Score 2 1  Altered sleeping 0 0  Tired, decreased energy 1 3  Change in appetite 0 2  Feeling bad or failure about yourself  0 0  Trouble concentrating 0 0  Moving slowly or fidgety/restless 0 0  Suicidal thoughts 0 0  PHQ-9 Score 3 6  Difficult doing work/chores Not difficult at all Somewhat difficult    Quality of Life: Quality of Life - 08/02/17 1747      Quality of Life Scores   Health/Function Post  21 %     Socioeconomic Post  21 %    Psych/Spiritual Post  21 %    Family Post  21 %    GLOBAL Post  21 %       Personal Goals: Goals established at orientation with interventions provided to work toward goal. Personal Goals and Risk Factors at Admission - 05/25/17 1317      Core Components/Risk Factors/Patient Goals on Admission    Weight Management  Yes;Weight Maintenance    Intervention  Weight Management: Develop a combined nutrition and exercise program designed to reach desired caloric intake, while maintaining appropriate intake of nutrient and fiber, sodium and fats, and appropriate energy expenditure required for the weight goal.;Weight Management: Provide education and appropriate resources to help participant work on and attain dietary goals.    Admit Weight  147 lb (66.7 kg) Stays between 145-147 lb at home and wishes to continue to stay there    Goal Weight: Short Term  145 lb (65.8 kg)    Goal Weight: Long Term  145 lb (65.8 kg)    Expected Outcomes  Short Term: Continue to assess and modify interventions until short term weight is achieved;Long Term: Adherence to nutrition and physical activity/exercise program aimed toward attainment of established weight goal;Weight Maintenance: Understanding of the daily nutrition guidelines, which includes 25-35% calories from fat, 7% or  less cal from saturated fats, less than 273m cholesterol, less than 1.5gm of sodium, & 5 or more servings of fruits and vegetables daily;Understanding recommendations for meals to include 15-35% energy as protein, 25-35% energy from fat, 35-60% energy from carbohydrates, less than 2050mof dietary cholesterol, 20-35 gm of total fiber daily;Understanding of distribution of calorie intake throughout the day with the consumption of 4-5 meals/snacks    Diabetes  Yes    Intervention  Provide education about signs/symptoms and action to take for hypo/hyperglycemia.;Provide education about proper nutrition, including  hydration, and aerobic/resistive exercise prescription along with prescribed medications to achieve blood glucose in normal ranges: Fasting glucose 65-99 mg/dL    Expected Outcomes  Short Term: Participant verbalizes understanding of the signs/symptoms and immediate care of hyper/hypoglycemia, proper foot care and importance of medication, aerobic/resistive exercise and nutrition plan for blood glucose control.;Long Term: Attainment of HbA1C < 7%.    Heart Failure  Yes    Intervention  Provide a combined exercise and nutrition program that is supplemented with education, support and counseling about heart failure. Directed toward relieving symptoms such as shortness of breath, decreased exercise tolerance, and extremity edema.    Expected Outcomes  Improve functional capacity of life;Short term: Attendance in program 2-3 days a week with increased exercise capacity. Reported lower sodium intake. Reported increased fruit and vegetable intake. Reports medication compliance.;Short term: Daily weights obtained and reported for increase. Utilizing diuretic protocols set by physician.;Long term: Adoption of self-care skills and reduction of barriers for early signs and symptoms recognition and intervention leading to self-care maintenance.    Hypertension  Yes    Intervention  Provide education on lifestyle modifcations including regular physical activity/exercise, weight management, moderate sodium restriction and increased consumption of fresh fruit, vegetables, and low fat dairy, alcohol moderation, and smoking cessation.;Monitor prescription use compliance.    Expected Outcomes  Short Term: Continued assessment and intervention until BP is < 140/9011mG in hypertensive participants. < 130/97m46m in hypertensive participants with diabetes, heart failure or chronic kidney disease.;Long Term: Maintenance of blood pressure at goal levels.    Lipids  Yes    Intervention  Provide education and support for  participant on nutrition & aerobic/resistive exercise along with prescribed medications to achieve LDL <70mg70mL >40mg.20mExpected Outcomes  Short Term: Participant states understanding of desired cholesterol values and is compliant with medications prescribed. Participant is following exercise prescription and nutrition guidelines.;Long Term: Cholesterol controlled with medications as prescribed, with individualized exercise RX and with personalized nutrition plan. Value goals: LDL < 70mg, 47m> 40 mg.    Stress  Yes He has had a rough couple of years health wise. From cancer and requiring a trach to a NSTEMI and Heart Failure. Kerry Rugeressed about the future and what else could go wrong.     Intervention  Offer individual and/or small group education and counseling on adjustment to heart disease, stress management and health-related lifestyle change. Teach and support self-help strategies.;Refer participants experiencing significant psychosocial distress to appropriate mental health specialists for further evaluation and treatment. When possible, include family members and significant others in education/counseling sessions.    Expected Outcomes  Short Term: Participant demonstrates changes in health-related behavior, relaxation and other stress management skills, ability to obtain effective social support, and compliance with psychotropic medications if prescribed.;Long Term: Emotional wellbeing is indicated by absence of clinically significant psychosocial distress or social isolation.        Personal Goals Discharge: Goals  and Risk Factor Review    Row Name 06/28/17 1729 07/19/17 1637           Core Components/Risk Factors/Patient Goals Review   Personal Goals Review  Diabetes;Lipids;Hypertension  Diabetes;Hypertension;Weight Management/Obesity;Lipids      Review  Daisuke states he is taking meds as prescribed - BP has been good in HT class.  He is motitoring BG at home.  He has noticed he  sleeps better and can walk around better without fatigue during ADLS.  He is having some stress due to the unexpected death of his wifes niece  Shriyans continues to have good BP and BG readings.  He notices continued improvement in ease of ADLs.         Expected Outcomes  Short - Ector will use some of the stress management techniques and exercise to manage stress  Long - Joyce will use exercise and othe techniques to manage stress long term  Short - Ayub will continue to attend class and take meds as directed Long - Semaj will maintain fitness gains on his own         Exercise Goals and Review: Exercise Goals    Row Name 05/25/17 1352             Exercise Goals   Increase Physical Activity  Yes       Intervention  Provide advice, education, support and counseling about physical activity/exercise needs.;Develop an individualized exercise prescription for aerobic and resistive training based on initial evaluation findings, risk stratification, comorbidities and participant's personal goals.       Expected Outcomes  Achievement of increased cardiorespiratory fitness and enhanced flexibility, muscular endurance and strength shown through measurements of functional capacity and personal statement of participant.       Increase Strength and Stamina  Yes       Intervention  Provide advice, education, support and counseling about physical activity/exercise needs.;Develop an individualized exercise prescription for aerobic and resistive training based on initial evaluation findings, risk stratification, comorbidities and participant's personal goals.       Expected Outcomes  Achievement of increased cardiorespiratory fitness and enhanced flexibility, muscular endurance and strength shown through measurements of functional capacity and personal statement of participant.       Able to understand and use rate of perceived exertion (RPE) scale  Yes       Intervention  Provide education and explanation  on how to use RPE scale       Expected Outcomes  Short Term: Able to use RPE daily in rehab to express subjective intensity level;Long Term:  Able to use RPE to guide intensity level when exercising independently       Able to understand and use Dyspnea scale  Yes       Intervention  Provide education and explanation on how to use Dyspnea scale       Expected Outcomes  Short Term: Able to use Dyspnea scale daily in rehab to express subjective sense of shortness of breath during exertion;Long Term: Able to use Dyspnea scale to guide intensity level when exercising independently       Knowledge and understanding of Target Heart Rate Range (THRR)  Yes       Intervention  Provide education and explanation of THRR including how the numbers were predicted and where they are located for reference       Expected Outcomes  Short Term: Able to state/look up THRR;Long Term: Able to use THRR to govern intensity when exercising  independently;Short Term: Able to use daily as guideline for intensity in rehab       Able to check pulse independently  Yes       Intervention  Provide education and demonstration on how to check pulse in carotid and radial arteries.;Review the importance of being able to check your own pulse for safety during independent exercise       Expected Outcomes  Short Term: Able to explain why pulse checking is important during independent exercise;Long Term: Able to check pulse independently and accurately       Understanding of Exercise Prescription  Yes       Intervention  Provide education, explanation, and written materials on patient's individual exercise prescription       Expected Outcomes  Short Term: Able to explain program exercise prescription;Long Term: Able to explain home exercise prescription to exercise independently          Nutrition & Weight - Outcomes: Pre Biometrics - 05/25/17 1351      Pre Biometrics   Height  _0  (1.676 m)    Weight  151 lb (68.5 kg)    Waist  Circumference  36 inches    Hip Circumference  37 inches    Waist to Hip Ratio  0.97 %    BMI (Calculated)  24.38    Single Leg Stand  9.35 seconds      Post Biometrics - 07/26/17 1803       Post  Biometrics   Height  _1  (1.676 m)    Weight  147 lb (66.7 kg)    Waist Circumference  35 inches    Hip Circumference  37 inches    Waist to Hip Ratio  0.95 %    BMI (Calculated)  23.74    Single Leg Stand  15.58 seconds       Nutrition: Nutrition Therapy & Goals - 05/25/17 1321      Intervention Plan   Intervention  Prescribe, educate and counsel regarding individualized specific dietary modifications aiming towards targeted core components such as weight, hypertension, lipid management, diabetes, heart failure and other comorbidities.;Nutrition handout(s) given to patient.    Expected Outcomes  Short Term Goal: Understand basic principles of dietary content, such as calories, fat, sodium, cholesterol and nutrients.;Short Term Goal: A plan has been developed with personal nutrition goals set during dietitian appointment.;Long Term Goal: Adherence to prescribed nutrition plan.       Nutrition Discharge: Nutrition Assessments - 08/02/17 1747      MEDFICTS Scores   Post Score  12       Education Questionnaire Score: Knowledge Questionnaire Score - 08/02/17 1748      Knowledge Questionnaire Score   Post Score  28/28       Goals reviewed with patient; copy given to patient.

## 2017-09-23 DIAGNOSIS — N186 End stage renal disease: Secondary | ICD-10-CM | POA: Insufficient documentation

## 2017-10-03 DIAGNOSIS — Z992 Dependence on renal dialysis: Secondary | ICD-10-CM | POA: Insufficient documentation

## 2017-10-03 DIAGNOSIS — D689 Coagulation defect, unspecified: Secondary | ICD-10-CM | POA: Insufficient documentation

## 2017-10-03 DIAGNOSIS — I2510 Atherosclerotic heart disease of native coronary artery without angina pectoris: Secondary | ICD-10-CM | POA: Insufficient documentation

## 2017-10-03 DIAGNOSIS — R0602 Shortness of breath: Secondary | ICD-10-CM | POA: Insufficient documentation

## 2017-10-03 DIAGNOSIS — Z111 Encounter for screening for respiratory tuberculosis: Secondary | ICD-10-CM | POA: Insufficient documentation

## 2017-10-09 DIAGNOSIS — D509 Iron deficiency anemia, unspecified: Secondary | ICD-10-CM | POA: Insufficient documentation

## 2018-01-17 ENCOUNTER — Emergency Department
Admission: EM | Admit: 2018-01-17 | Discharge: 2018-01-17 | Disposition: A | Discharge status: Home / Self Care | Payer: Medicare Other | Attending: Emergency Medicine | Admitting: Emergency Medicine

## 2018-01-17 ENCOUNTER — Other Ambulatory Visit: Payer: Self-pay

## 2018-01-17 ENCOUNTER — Encounter: Payer: Self-pay | Admitting: Emergency Medicine

## 2018-01-17 DIAGNOSIS — I509 Heart failure, unspecified: Secondary | ICD-10-CM | POA: Diagnosis not present

## 2018-01-17 DIAGNOSIS — I2510 Atherosclerotic heart disease of native coronary artery without angina pectoris: Secondary | ICD-10-CM | POA: Insufficient documentation

## 2018-01-17 DIAGNOSIS — I130 Hypertensive heart and chronic kidney disease with heart failure and stage 1 through stage 4 chronic kidney disease, or unspecified chronic kidney disease: Secondary | ICD-10-CM | POA: Insufficient documentation

## 2018-01-17 DIAGNOSIS — E119 Type 2 diabetes mellitus without complications: Secondary | ICD-10-CM | POA: Insufficient documentation

## 2018-01-17 DIAGNOSIS — Y999 Unspecified external cause status: Secondary | ICD-10-CM | POA: Insufficient documentation

## 2018-01-17 DIAGNOSIS — Y939 Activity, unspecified: Secondary | ICD-10-CM | POA: Insufficient documentation

## 2018-01-17 DIAGNOSIS — Z79899 Other long term (current) drug therapy: Secondary | ICD-10-CM | POA: Diagnosis not present

## 2018-01-17 DIAGNOSIS — Z7901 Long term (current) use of anticoagulants: Secondary | ICD-10-CM | POA: Diagnosis not present

## 2018-01-17 DIAGNOSIS — W2209XA Striking against other stationary object, initial encounter: Secondary | ICD-10-CM | POA: Insufficient documentation

## 2018-01-17 DIAGNOSIS — Z87891 Personal history of nicotine dependence: Secondary | ICD-10-CM | POA: Diagnosis not present

## 2018-01-17 DIAGNOSIS — Z7982 Long term (current) use of aspirin: Secondary | ICD-10-CM | POA: Diagnosis not present

## 2018-01-17 DIAGNOSIS — S81812A Laceration without foreign body, left lower leg, initial encounter: Secondary | ICD-10-CM | POA: Diagnosis present

## 2018-01-17 DIAGNOSIS — N184 Chronic kidney disease, stage 4 (severe): Secondary | ICD-10-CM | POA: Diagnosis not present

## 2018-01-17 DIAGNOSIS — I110 Hypertensive heart disease with heart failure: Secondary | ICD-10-CM | POA: Diagnosis not present

## 2018-01-17 DIAGNOSIS — Z85828 Personal history of other malignant neoplasm of skin: Secondary | ICD-10-CM | POA: Insufficient documentation

## 2018-01-17 DIAGNOSIS — Y92009 Unspecified place in unspecified non-institutional (private) residence as the place of occurrence of the external cause: Secondary | ICD-10-CM | POA: Insufficient documentation

## 2018-01-17 DIAGNOSIS — Z23 Encounter for immunization: Secondary | ICD-10-CM | POA: Insufficient documentation

## 2018-01-17 DIAGNOSIS — J449 Chronic obstructive pulmonary disease, unspecified: Secondary | ICD-10-CM | POA: Diagnosis not present

## 2018-01-17 MED ORDER — TETANUS-DIPHTH-ACELL PERTUSSIS 5-2.5-18.5 LF-MCG/0.5 IM SUSP
0.5000 mL | Freq: Once | INTRAMUSCULAR | Status: AC
  Administered 2018-01-17: 0.5 mL via INTRAMUSCULAR
  Filled 2018-01-17: qty 0.5

## 2018-01-17 NOTE — ED Notes (Signed)
Pt discharged home after verbalizing understanding of discharge instructions; nad noted. 

## 2018-01-17 NOTE — ED Triage Notes (Signed)
Pt arrived via POV from home with reports of cutting his left shin on Monday, leg continues to bleed.  Wife states pt hit his leg on a table outside.  Pt only takes 81mg  ASA daily.

## 2018-01-17 NOTE — ED Notes (Signed)
Pt in bathroom at this time.

## 2018-01-17 NOTE — Discharge Instructions (Signed)
Up with your primary care provider if any continued problems.  Leave this dressing on for 2 days and then you may remove and clean with mild soap and water.  Watch the area for any signs of infection.  Follow-up with your doctor if there is any redness, pus or fever.  Area should heal nicely.  You do not need to use topical antibiotic ointments. Also your tetanus was updated on today's visit.

## 2018-01-17 NOTE — ED Notes (Signed)
Pt presents with left leg injury on Monday that has not stopped bleeding. Wife states she is changing the bandage twice a day. Called PCP who told them to come to ED. States that it soaked through the bandage overnight and has continued to bleed. Pt denies blood thinners, takes 81 mg asa. Pt denies pain, alert & oriented with NAD noted.

## 2018-01-17 NOTE — ED Provider Notes (Signed)
[facility] Emergency Department Provider Note   ____________________________________________   First MD Initiated Contact with Patient 01/17/18 1005     (approximate)  I have reviewed the triage vital signs and the nursing notes.   HISTORY  Chief Complaint Laceration (left lower leg)   HPI Kerry [name] is a 84 y.o. male presents to the emergency department with a complaint of laceration to his left lower leg from 2 days ago.  Wife states that he is continued to have bleeding.  She states that he bumped his leg on a coffee table.  She is unaware of his last tetanus but does not believe it is been within 10 years.  Patient does take aspirin 81 mg daily.   Past Medical History:  Diagnosis Date  . Cancer ([place])   . CHF (congestive heart failure) ([place])   . COPD (chronic obstructive pulmonary disease) ([place])   . Diabetes mellitus without complication ([place])   . Hypertension   . Renal disorder     Patient Active Problem List   Diagnosis Date Noted  . Diverticulosis 05/25/2017  . Hyperlipidemia 05/25/2017  . Hypertension 05/25/2017  . Type 2 diabetes mellitus ([place]) 05/25/2017  . Anemia of chronic disease 04/17/2017  . History of tracheostomy 03/14/2017  . NSTEMI (non-ST elevated myocardial infarction) ([place]) 03/13/2017  . 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 Low back pain 11/23/2016  . Vitamin D insufficiency 06/28/2016  . CKD (chronic kidney disease) stage 4, GFR 15-29 ml/min ([facility]) 04/05/2016  . Coronary artery disease 02/09/2016  . Heart failure with preserved ejection fraction ([place]) 02/09/2016  . 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 History of hyperkalemia 02/12/2015  . 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 Hypopharyngeal mass 11/28/2014  . Vocal cord paralysis 11/28/2014  . Sleep apnea 12/14/2011  . REM behavioral disorder 12/14/2011  . Smoker 07/22/2011    Past Surgical  History:  Procedure Laterality Date  . BACK SURGERY      Prior to Admission medications   Medication Sig Start Date End Date Taking? Authorizing Provider  amLODipine (NORVASC) 2.5 MG tablet Take 2.5 mg by mouth daily.  05/18/17   [provider]  aspirin 81 MG chewable tablet Chew 81 mg by mouth daily.  03/15/17   [provider]  atorvastatin (LIPITOR) 80 MG tablet Take 80 mg by mouth daily.  03/15/17   [provider]  Cholecalciferol (VITAMIN D3) 1000 units CAPS Take 1,000 Units by mouth daily.     [provider]  clopidogrel (PLAVIX) 75 MG tablet Take 75 mg by mouth daily.  05/10/17   [provider]  ferrous sulfate 325 (65 FE) MG EC tablet Take 325 mg by mouth daily with breakfast.    [provider]  GLIPIZIDE XL 10 MG 24 hr tablet Take 10 mg by mouth daily with breakfast.  05/05/17   [provider]  glucagon (GLUCAGON EMERGENCY) 1 MG injection as needed. Reported on 06/02/2015 01/19/15   [provider]  glucose blood (PRECISION QID TEST) test strip Accu check Aviva strips, use twice daily 01/16/15   [provider]  hydrALAZINE (APRESOLINE) 25 MG tablet Take 25 mg by mouth 3 (three) times daily.  03/01/17   [provider]  isosorbide dinitrate (ISORDIL) 20 MG tablet Take 20 mg by mouth 3 (three) times daily.    [provider]  metoprolol succinate (TOPROL-XL) 25 MG 24 hr tablet Take 25  mg by mouth daily.  03/01/17   [provider]  mirtazapine (REMERON) 7.5 MG tablet Take 7.5 mg by mouth at bedtime.  05/11/17   [provider]  nitroGLYCERIN (NITROSTAT) 0.3 MG SL tablet Place 0.3 mg under the tongue every 5 (five) minutes as needed for chest pain.  03/16/17   [provider]  torsemide (DEMADEX) 20 MG tablet Take 20 mg by mouth daily.  05/15/17   [provider]    Allergies Ace inhibitors; Gabapentin; and Losartan  History reviewed. No pertinent  family history.  Social History Social History   Tobacco Use  . Smoking status: Former Smoker    Packs/day: 0.50    Years: 20.00    Pack years: 10.00    Types: Cigarettes    Last attempt to quit: 12/05/2014    Years since quitting: 3.1  . Smokeless tobacco: Never Used  . Tobacco comment: Quit 12/2014  Substance Use Topics  . Alcohol use: No    Frequency: Never  . Drug use: No    Review of Systems Constitutional: No fever/chills Cardiovascular: Denies chest pain. Respiratory: Denies shortness of breath. Musculoskeletal: Negative for leg pain. Skin: Positive for laceration. Neurological: Negative for headaches, focal weakness or numbness. ___________________________________________   PHYSICAL EXAM:  VITAL SIGNS: ED Triage Vitals  Enc Vitals Group     BP 01/17/18 0948 (!) 106/94     Pulse Rate 01/17/18 0948 90     Resp 01/17/18 0948 16     Temp 01/17/18 0948 98.6 F (37 C)     Temp Source 01/17/18 0948 Oral     SpO2 01/17/18 0948 96 %     Weight 01/17/18 0950 131 lb (59.4 kg)     Height 01/17/18 0950 5\' 6"  (1.676 m)     Head Circumference --      Peak Flow --      Pain Score 01/17/18 0950 0     Pain Loc --      Pain Edu? --      Excl. in [place]? --    Constitutional: Alert and oriented. Well appearing and in no acute distress. Eyes: Conjunctivae are normal.  Head: Atraumatic. Neck: No stridor.   Respiratory: Normal respiratory effort.   Musculoskeletal: Examination of the left lower one third extremity anterior aspect there is a 1 cm superficial laceration without active bleeding at this time.  There is no evidence of infection or foreign body. Neurologic:  Normal speech and language. No gross focal neurologic deficits are appreciated. No gait instability. Skin:  Skin is warm, dry.  As above. Psychiatric: Mood and affect are normal. Speech and behavior are normal.  ____________________________________________   LABS (all labs ordered are listed, but only  abnormal results are displayed)  Labs Reviewed - No data to display  PROCEDURES  Procedure(s) performed: None  Procedures  Critical Care performed: No  ____________________________________________   INITIAL IMPRESSION / ASSESSMENT AND PLAN / ED COURSE  As part of my medical decision making, I reviewed the following data within the electronic MEDICAL RECORD NUMBER Notes from prior ED visits and [place] Controlled Substance Database  Area was cleaned with normal saline and a surigel dressing was placed to the area.  Patient was instructed to leave this for 2 days.  Wife will begin cleaning with mild soap and water after the 2 days and watch for any signs of infection.  He is to follow-up with his PCP if any continued problems.  ____________________________________________   FINAL  CLINICAL IMPRESSION(S) / ED DIAGNOSES  Final diagnoses:  Laceration of left lower extremity, initial encounter     ED Discharge Orders    None       Note:  This document was prepared using Dragon voice recognition software and may include unintentional dictation errors.    Johnn Hai, PA-C 01/17/18 1152    Nena Polio, MD 01/17/18 1515

## 2018-02-28 DIAGNOSIS — Z23 Encounter for immunization: Secondary | ICD-10-CM | POA: Insufficient documentation

## 2018-03-08 DIAGNOSIS — Z7901 Long term (current) use of anticoagulants: Secondary | ICD-10-CM | POA: Insufficient documentation

## 2018-03-08 DIAGNOSIS — R197 Diarrhea, unspecified: Secondary | ICD-10-CM | POA: Insufficient documentation

## 2018-03-08 DIAGNOSIS — R159 Full incontinence of feces: Secondary | ICD-10-CM | POA: Insufficient documentation

## 2018-03-12 ENCOUNTER — Other Ambulatory Visit
Admission: RE | Admit: 2018-03-12 | Discharge: 2018-03-12 | Disposition: A | Discharge status: Home / Self Care | Payer: Medicare Other | Source: Ambulatory Visit | Attending: Internal Medicine | Admitting: Internal Medicine

## 2018-03-12 DIAGNOSIS — R197 Diarrhea, unspecified: Secondary | ICD-10-CM | POA: Insufficient documentation

## 2018-03-12 DIAGNOSIS — R634 Abnormal weight loss: Secondary | ICD-10-CM | POA: Diagnosis present

## 2018-03-12 LAB — GASTROINTESTINAL PANEL BY PCR, STOOL (REPLACES STOOL CULTURE)
ASTROVIRUS: NOT DETECTED
Adenovirus F40/41: NOT DETECTED
CYCLOSPORA CAYETANENSIS: NOT DETECTED
Campylobacter species: NOT DETECTED
Cryptosporidium: NOT DETECTED
ENTEROTOXIGENIC E COLI (ETEC): NOT DETECTED
Entamoeba histolytica: NOT DETECTED
Enteroaggregative E coli (EAEC): NOT DETECTED
Enteropathogenic E coli (EPEC): NOT DETECTED
Giardia lamblia: NOT DETECTED
NOROVIRUS GI/GII: NOT DETECTED
Plesimonas shigelloides: NOT DETECTED
Rotavirus A: NOT DETECTED
SAPOVIRUS (I, II, IV, AND V): NOT DETECTED
SHIGA LIKE TOXIN PRODUCING E COLI (STEC): NOT DETECTED
Salmonella species: NOT DETECTED
Shigella/Enteroinvasive E coli (EIEC): NOT DETECTED
VIBRIO CHOLERAE: NOT DETECTED
VIBRIO SPECIES: NOT DETECTED
Yersinia enterocolitica: NOT DETECTED

## 2018-03-12 LAB — CLOSTRIDIUM DIFFICILE BY PCR, REFLEXED: Toxigenic C. Difficile by PCR: NEGATIVE

## 2018-03-12 LAB — C DIFFICILE QUICK SCREEN W PCR REFLEX
C DIFFICILE (CDIFF) TOXIN: NEGATIVE
C DIFFICLE (CDIFF) ANTIGEN: POSITIVE — AB

## 2018-04-10 DIAGNOSIS — K529 Noninfective gastroenteritis and colitis, unspecified: Secondary | ICD-10-CM | POA: Insufficient documentation

## 2018-07-26 DIAGNOSIS — Z9002 Acquired absence of larynx: Secondary | ICD-10-CM | POA: Insufficient documentation

## 2019-07-18 DIAGNOSIS — R770 Abnormality of albumin: Secondary | ICD-10-CM | POA: Insufficient documentation

## 2019-11-28 ENCOUNTER — Inpatient Hospital Stay: Payer: Medicare Other

## 2019-11-28 ENCOUNTER — Encounter: Payer: Self-pay | Admitting: Pulmonary Disease

## 2019-11-28 ENCOUNTER — Inpatient Hospital Stay
Admission: EM | Admit: 2019-11-28 | Discharge: 2019-12-17 | DRG: 308 | Disposition: A | Discharge status: Skilled Nursing Facility | Payer: Medicare Other | Source: Other Acute Inpatient Hospital | Attending: Internal Medicine | Admitting: Internal Medicine

## 2019-11-28 ENCOUNTER — Emergency Department: Payer: Medicare Other

## 2019-11-28 DIAGNOSIS — M7989 Other specified soft tissue disorders: Secondary | ICD-10-CM

## 2019-11-28 DIAGNOSIS — Z7982 Long term (current) use of aspirin: Secondary | ICD-10-CM

## 2019-11-28 DIAGNOSIS — E872 Acidosis: Secondary | ICD-10-CM | POA: Diagnosis present

## 2019-11-28 DIAGNOSIS — D631 Anemia in chronic kidney disease: Secondary | ICD-10-CM | POA: Diagnosis present

## 2019-11-28 DIAGNOSIS — I469 Cardiac arrest, cause unspecified: Secondary | ICD-10-CM | POA: Diagnosis present

## 2019-11-28 DIAGNOSIS — R4020 Unspecified coma: Secondary | ICD-10-CM | POA: Diagnosis present

## 2019-11-28 DIAGNOSIS — I472 Ventricular tachycardia: Secondary | ICD-10-CM | POA: Diagnosis present

## 2019-11-28 DIAGNOSIS — J9602 Acute respiratory failure with hypercapnia: Secondary | ICD-10-CM | POA: Diagnosis not present

## 2019-11-28 DIAGNOSIS — I5023 Acute on chronic systolic (congestive) heart failure: Secondary | ICD-10-CM | POA: Diagnosis not present

## 2019-11-28 DIAGNOSIS — I959 Hypotension, unspecified: Secondary | ICD-10-CM

## 2019-11-28 DIAGNOSIS — R4182 Altered mental status, unspecified: Secondary | ICD-10-CM | POA: Diagnosis not present

## 2019-11-28 DIAGNOSIS — Z992 Dependence on renal dialysis: Secondary | ICD-10-CM

## 2019-11-28 DIAGNOSIS — K5900 Constipation, unspecified: Secondary | ICD-10-CM | POA: Diagnosis not present

## 2019-11-28 DIAGNOSIS — E1122 Type 2 diabetes mellitus with diabetic chronic kidney disease: Secondary | ICD-10-CM | POA: Diagnosis present

## 2019-11-28 DIAGNOSIS — I132 Hypertensive heart and chronic kidney disease with heart failure and with stage 5 chronic kidney disease, or end stage renal disease: Secondary | ICD-10-CM | POA: Diagnosis present

## 2019-11-28 DIAGNOSIS — I5043 Acute on chronic combined systolic (congestive) and diastolic (congestive) heart failure: Secondary | ICD-10-CM | POA: Diagnosis present

## 2019-11-28 DIAGNOSIS — Z87891 Personal history of nicotine dependence: Secondary | ICD-10-CM

## 2019-11-28 DIAGNOSIS — I471 Supraventricular tachycardia: Secondary | ICD-10-CM | POA: Diagnosis present

## 2019-11-28 DIAGNOSIS — G4733 Obstructive sleep apnea (adult) (pediatric): Secondary | ICD-10-CM | POA: Diagnosis present

## 2019-11-28 DIAGNOSIS — G92 Toxic encephalopathy: Secondary | ICD-10-CM | POA: Diagnosis present

## 2019-11-28 DIAGNOSIS — I82C11 Acute embolism and thrombosis of right internal jugular vein: Secondary | ICD-10-CM | POA: Diagnosis present

## 2019-11-28 DIAGNOSIS — R570 Cardiogenic shock: Secondary | ICD-10-CM | POA: Diagnosis not present

## 2019-11-28 DIAGNOSIS — I480 Paroxysmal atrial fibrillation: Secondary | ICD-10-CM | POA: Diagnosis present

## 2019-11-28 DIAGNOSIS — N2581 Secondary hyperparathyroidism of renal origin: Secondary | ICD-10-CM | POA: Diagnosis present

## 2019-11-28 DIAGNOSIS — I2510 Atherosclerotic heart disease of native coronary artery without angina pectoris: Secondary | ICD-10-CM | POA: Diagnosis not present

## 2019-11-28 DIAGNOSIS — J9622 Acute and chronic respiratory failure with hypercapnia: Secondary | ICD-10-CM | POA: Diagnosis present

## 2019-11-28 DIAGNOSIS — I4891 Unspecified atrial fibrillation: Secondary | ICD-10-CM

## 2019-11-28 DIAGNOSIS — Z930 Tracheostomy status: Secondary | ICD-10-CM

## 2019-11-28 DIAGNOSIS — Z9889 Other specified postprocedural states: Secondary | ICD-10-CM | POA: Diagnosis not present

## 2019-11-28 DIAGNOSIS — E785 Hyperlipidemia, unspecified: Secondary | ICD-10-CM | POA: Diagnosis present

## 2019-11-28 DIAGNOSIS — D6859 Other primary thrombophilia: Secondary | ICD-10-CM | POA: Diagnosis present

## 2019-11-28 DIAGNOSIS — D638 Anemia in other chronic diseases classified elsewhere: Secondary | ICD-10-CM | POA: Diagnosis present

## 2019-11-28 DIAGNOSIS — K219 Gastro-esophageal reflux disease without esophagitis: Secondary | ICD-10-CM | POA: Diagnosis present

## 2019-11-28 DIAGNOSIS — J9600 Acute respiratory failure, unspecified whether with hypoxia or hypercapnia: Secondary | ICD-10-CM

## 2019-11-28 DIAGNOSIS — J9601 Acute respiratory failure with hypoxia: Secondary | ICD-10-CM | POA: Diagnosis not present

## 2019-11-28 DIAGNOSIS — I5033 Acute on chronic diastolic (congestive) heart failure: Secondary | ICD-10-CM | POA: Diagnosis not present

## 2019-11-28 DIAGNOSIS — I255 Ischemic cardiomyopathy: Secondary | ICD-10-CM | POA: Diagnosis present

## 2019-11-28 DIAGNOSIS — N179 Acute kidney failure, unspecified: Secondary | ICD-10-CM | POA: Diagnosis not present

## 2019-11-28 DIAGNOSIS — I5021 Acute systolic (congestive) heart failure: Secondary | ICD-10-CM | POA: Diagnosis not present

## 2019-11-28 DIAGNOSIS — I4901 Ventricular fibrillation: Secondary | ICD-10-CM | POA: Diagnosis present

## 2019-11-28 DIAGNOSIS — R000 Tachycardia, unspecified: Secondary | ICD-10-CM | POA: Diagnosis not present

## 2019-11-28 DIAGNOSIS — I5030 Unspecified diastolic (congestive) heart failure: Secondary | ICD-10-CM | POA: Diagnosis present

## 2019-11-28 DIAGNOSIS — N186 End stage renal disease: Secondary | ICD-10-CM | POA: Diagnosis present

## 2019-11-28 DIAGNOSIS — R079 Chest pain, unspecified: Secondary | ICD-10-CM | POA: Diagnosis not present

## 2019-11-28 DIAGNOSIS — N184 Chronic kidney disease, stage 4 (severe): Secondary | ICD-10-CM | POA: Diagnosis not present

## 2019-11-28 DIAGNOSIS — Z20822 Contact with and (suspected) exposure to covid-19: Secondary | ICD-10-CM | POA: Diagnosis present

## 2019-11-28 DIAGNOSIS — R609 Edema, unspecified: Secondary | ICD-10-CM

## 2019-11-28 DIAGNOSIS — I5084 End stage heart failure: Secondary | ICD-10-CM | POA: Diagnosis present

## 2019-11-28 DIAGNOSIS — J3800 Paralysis of vocal cords and larynx, unspecified: Secondary | ICD-10-CM | POA: Diagnosis present

## 2019-11-28 DIAGNOSIS — Z8521 Personal history of malignant neoplasm of larynx: Secondary | ICD-10-CM

## 2019-11-28 DIAGNOSIS — J9621 Acute and chronic respiratory failure with hypoxia: Secondary | ICD-10-CM | POA: Diagnosis not present

## 2019-11-28 DIAGNOSIS — Z7984 Long term (current) use of oral hypoglycemic drugs: Secondary | ICD-10-CM

## 2019-11-28 DIAGNOSIS — I462 Cardiac arrest due to underlying cardiac condition: Secondary | ICD-10-CM | POA: Diagnosis present

## 2019-11-28 DIAGNOSIS — I953 Hypotension of hemodialysis: Secondary | ICD-10-CM | POA: Diagnosis present

## 2019-11-28 DIAGNOSIS — I82621 Acute embolism and thrombosis of deep veins of right upper extremity: Secondary | ICD-10-CM | POA: Diagnosis present

## 2019-11-28 DIAGNOSIS — J449 Chronic obstructive pulmonary disease, unspecified: Secondary | ICD-10-CM | POA: Diagnosis present

## 2019-11-28 DIAGNOSIS — R7301 Impaired fasting glucose: Secondary | ICD-10-CM | POA: Diagnosis present

## 2019-11-28 DIAGNOSIS — R1033 Periumbilical pain: Secondary | ICD-10-CM | POA: Diagnosis not present

## 2019-11-28 DIAGNOSIS — E1169 Type 2 diabetes mellitus with other specified complication: Secondary | ICD-10-CM | POA: Diagnosis not present

## 2019-11-28 DIAGNOSIS — I252 Old myocardial infarction: Secondary | ICD-10-CM

## 2019-11-28 DIAGNOSIS — E876 Hypokalemia: Secondary | ICD-10-CM | POA: Diagnosis present

## 2019-11-28 DIAGNOSIS — I5022 Chronic systolic (congestive) heart failure: Secondary | ICD-10-CM

## 2019-11-28 DIAGNOSIS — Z79899 Other long term (current) drug therapy: Secondary | ICD-10-CM

## 2019-11-28 DIAGNOSIS — R7401 Elevation of levels of liver transaminase levels: Secondary | ICD-10-CM | POA: Diagnosis present

## 2019-11-28 DIAGNOSIS — E119 Type 2 diabetes mellitus without complications: Secondary | ICD-10-CM

## 2019-11-28 DIAGNOSIS — I10 Essential (primary) hypertension: Secondary | ICD-10-CM | POA: Diagnosis present

## 2019-11-28 LAB — TROPONIN I (HIGH SENSITIVITY)
Troponin I (High Sensitivity): 45 ng/L — ABNORMAL HIGH (ref <18)
Troponin I (High Sensitivity): 61 ng/L — ABNORMAL HIGH (ref <18)
Troponin I (High Sensitivity): 242 ng/L (ref <18)
Troponin I (High Sensitivity): 630 ng/L (ref <18)

## 2019-11-28 LAB — BLOOD GAS, ARTERIAL
Acid-base deficit: 10.4 mmol/L — ABNORMAL HIGH (ref 0.0–2.0)
Acid-base deficit: 8.2 mmol/L — ABNORMAL HIGH (ref 0.0–2.0)
Bicarbonate: 16.3 mmol/L — ABNORMAL LOW (ref 20.0–28.0)
Bicarbonate: 18.4 mmol/L — ABNORMAL LOW (ref 20.0–28.0)
FIO2: 1
FIO2: 0.45
MECHVT: 400 mL
MECHVT: 400 mL
Mechanical Rate: 14
O2 Saturation: 100 %
O2 Saturation: 98.2 %
PEEP: 5 cmH2O
PEEP: 5 cmH2O
Patient temperature: 37
Patient temperature: 37
RATE: 14 resp/min
RATE: 14 resp/min
pCO2 arterial: 38 mmHg (ref 32.0–48.0)
pCO2 arterial: 42 mmHg (ref 32.0–48.0)
pH, Arterial: 7.24 — ABNORMAL LOW (ref 7.350–7.450)
pH, Arterial: 7.25 — ABNORMAL LOW (ref 7.350–7.450)
pO2, Arterial: 428 mmHg — ABNORMAL HIGH (ref 83.0–108.0)
pO2, Arterial: 123 mmHg — ABNORMAL HIGH (ref 83.0–108.0)

## 2019-11-28 LAB — COMPREHENSIVE METABOLIC PANEL
ALT: 300 U/L — ABNORMAL HIGH (ref 0–44)
ALT: 315 U/L — ABNORMAL HIGH (ref 0–44)
AST: 343 U/L — ABNORMAL HIGH (ref 15–41)
AST: 386 U/L — ABNORMAL HIGH (ref 15–41)
Albumin: 3.3 g/dL — ABNORMAL LOW (ref 3.5–5.0)
Albumin: 2.3 g/dL — ABNORMAL LOW (ref 3.5–5.0)
Alkaline Phosphatase: 65 U/L (ref 38–126)
Alkaline Phosphatase: 51 U/L (ref 38–126)
Anion gap: 15 (ref 5–15)
Anion gap: 15 (ref 5–15)
BUN: 25 mg/dL — ABNORMAL HIGH (ref 8–23)
BUN: 22 mg/dL (ref 8–23)
CO2: 24 mmol/L (ref 22–32)
CO2: 16 mmol/L — ABNORMAL LOW (ref 22–32)
Calcium: 8.9 mg/dL (ref 8.9–10.3)
Calcium: 7 mg/dL — ABNORMAL LOW (ref 8.9–10.3)
Chloride: 102 mmol/L (ref 98–111)
Chloride: 115 mmol/L — ABNORMAL HIGH (ref 98–111)
Creatinine, Ser: 4.37 mg/dL — ABNORMAL HIGH (ref 0.61–1.24)
Creatinine, Ser: 3.9 mg/dL — ABNORMAL HIGH (ref 0.61–1.24)
GFR calc Af Amer: 13 mL/min — ABNORMAL LOW (ref >60)
GFR calc Af Amer: 15 mL/min — ABNORMAL LOW (ref >60)
GFR calc non Af Amer: 12 mL/min — ABNORMAL LOW (ref >60)
GFR calc non Af Amer: 13 mL/min — ABNORMAL LOW (ref >60)
Glucose, Bld: 163 mg/dL — ABNORMAL HIGH (ref 70–99)
Glucose, Bld: 116 mg/dL — ABNORMAL HIGH (ref 70–99)
Potassium: 3.5 mmol/L (ref 3.5–5.1)
Potassium: 2.7 mmol/L — CL (ref 3.5–5.1)
Sodium: 141 mmol/L (ref 135–145)
Sodium: 146 mmol/L — ABNORMAL HIGH (ref 135–145)
Total Bilirubin: 1.5 mg/dL — ABNORMAL HIGH (ref 0.3–1.2)
Total Bilirubin: 1.3 mg/dL — ABNORMAL HIGH (ref 0.3–1.2)
Total Protein: 5.8 g/dL — ABNORMAL LOW (ref 6.5–8.1)
Total Protein: 4.2 g/dL — ABNORMAL LOW (ref 6.5–8.1)

## 2019-11-28 LAB — LACTIC ACID, PLASMA
Lactic Acid, Venous: 6.6 mmol/L (ref 0.5–1.9)
Lactic Acid, Venous: 7.4 mmol/L (ref 0.5–1.9)

## 2019-11-28 LAB — MRSA PCR SCREENING: MRSA by PCR: NEGATIVE

## 2019-11-28 LAB — BASIC METABOLIC PANEL
Anion gap: 21 — ABNORMAL HIGH (ref 5–15)
BUN: 28 mg/dL — ABNORMAL HIGH (ref 8–23)
CO2: 19 mmol/L — ABNORMAL LOW (ref 22–32)
Calcium: 8.2 mg/dL — ABNORMAL LOW (ref 8.9–10.3)
Chloride: 105 mmol/L (ref 98–111)
Creatinine, Ser: 4.68 mg/dL — ABNORMAL HIGH (ref 0.61–1.24)
GFR calc Af Amer: 12 mL/min — ABNORMAL LOW (ref >60)
GFR calc non Af Amer: 11 mL/min — ABNORMAL LOW (ref >60)
Glucose, Bld: 184 mg/dL — ABNORMAL HIGH (ref 70–99)
Potassium: 3.8 mmol/L (ref 3.5–5.1)
Sodium: 145 mmol/L (ref 135–145)

## 2019-11-28 LAB — CBC WITH DIFFERENTIAL/PLATELET
Abs Immature Granulocytes: 0.11 10*3/uL — ABNORMAL HIGH (ref 0.00–0.07)
Basophils Absolute: 0.1 10*3/uL (ref 0.0–0.1)
Basophils Relative: 1 %
Eosinophils Absolute: 0.1 10*3/uL (ref 0.0–0.5)
Eosinophils Relative: 1 %
HCT: 25.8 % — ABNORMAL LOW (ref 39.0–52.0)
Hemoglobin: 9.2 g/dL — ABNORMAL LOW (ref 13.0–17.0)
Immature Granulocytes: 1 %
Lymphocytes Relative: 24 %
Lymphs Abs: 2.4 10*3/uL (ref 0.7–4.0)
MCH: 32.9 pg (ref 26.0–34.0)
MCHC: 35.7 g/dL (ref 30.0–36.0)
MCV: 92.1 fL (ref 80.0–100.0)
Monocytes Absolute: 0.5 10*3/uL (ref 0.1–1.0)
Monocytes Relative: 5 %
Neutro Abs: 6.9 10*3/uL (ref 1.7–7.7)
Neutrophils Relative %: 68 %
Platelets: 154 10*3/uL (ref 150–400)
RBC: 2.8 MIL/uL — ABNORMAL LOW (ref 4.22–5.81)
RDW: 16.1 % — ABNORMAL HIGH (ref 11.5–15.5)
WBC: 10.1 10*3/uL (ref 4.0–10.5)
nRBC: 0 % (ref 0.0–0.2)

## 2019-11-28 LAB — PROTIME-INR
INR: 1.2 (ref 0.8–1.2)
INR: 1.5 — ABNORMAL HIGH (ref 0.8–1.2)
Prothrombin Time: 14.3 seconds (ref 11.4–15.2)
Prothrombin Time: 17.8 seconds — ABNORMAL HIGH (ref 11.4–15.2)

## 2019-11-28 LAB — GLUCOSE, CAPILLARY
Glucose-Capillary: 132 mg/dL — ABNORMAL HIGH (ref 70–99)
Glucose-Capillary: 137 mg/dL — ABNORMAL HIGH (ref 70–99)
Glucose-Capillary: 141 mg/dL — ABNORMAL HIGH (ref 70–99)
Glucose-Capillary: 238 mg/dL — ABNORMAL HIGH (ref 70–99)

## 2019-11-28 LAB — PROCALCITONIN: Procalcitonin: 0.68 ng/mL

## 2019-11-28 LAB — PHOSPHORUS: Phosphorus: 4.7 mg/dL — ABNORMAL HIGH (ref 2.5–4.6)

## 2019-11-28 LAB — MAGNESIUM: Magnesium: 2.4 mg/dL (ref 1.7–2.4)

## 2019-11-28 LAB — SARS CORONAVIRUS 2 BY RT PCR (HOSPITAL ORDER, PERFORMED IN ~~LOC~~ HOSPITAL LAB): SARS Coronavirus 2: NEGATIVE

## 2019-11-28 LAB — BRAIN NATRIURETIC PEPTIDE: B Natriuretic Peptide: 4339.6 pg/mL — ABNORMAL HIGH (ref 0.0–100.0)

## 2019-11-28 LAB — APTT: aPTT: 42 seconds — ABNORMAL HIGH (ref 24–36)

## 2019-11-28 MED ORDER — SODIUM CHLORIDE 0.9 % IV BOLUS
1000.0000 mL | Freq: Once | INTRAVENOUS | Status: AC
  Administered 2019-11-28: 1000 mL via INTRAVENOUS

## 2019-11-28 MED ORDER — FENTANYL BOLUS VIA INFUSION
25.0000 ug | INTRAVENOUS | Status: DC | PRN
  Filled 2019-11-28: qty 25

## 2019-11-28 MED ORDER — FENTANYL 2500MCG IN NS 250ML (10MCG/ML) PREMIX INFUSION
INTRAVENOUS | Status: AC
  Administered 2019-11-28: 100 ug/h
  Filled 2019-11-28: qty 250

## 2019-11-28 MED ORDER — NOREPINEPHRINE 4 MG/250ML-% IV SOLN: 0.0000 ug/min | INTRAVENOUS | Status: DC

## 2019-11-28 MED ORDER — SODIUM BICARBONATE 8.4 % IV SOLN
50.0000 meq | Freq: Once | INTRAVENOUS | Status: AC
  Administered 2019-11-28: 50 meq via INTRAVENOUS

## 2019-11-28 MED ORDER — MAGNESIUM SULFATE 2 GM/50ML IV SOLN
INTRAVENOUS | Status: AC
  Administered 2019-11-28: 2 g via INTRAVENOUS
  Filled 2019-11-28: qty 50

## 2019-11-28 MED ORDER — ASPIRIN 81 MG PO CHEW: 324.0000 mg | CHEWABLE_TABLET | ORAL | Status: AC

## 2019-11-28 MED ORDER — FENTANYL 2500MCG IN NS 250ML (10MCG/ML) PREMIX INFUSION: 100.0000 ug/h | INTRAVENOUS | Status: DC

## 2019-11-28 MED ORDER — VANCOMYCIN HCL IN DEXTROSE 1-5 GM/200ML-% IV SOLN
1000.0000 mg | Freq: Once | INTRAVENOUS | Status: AC
  Administered 2019-11-28: 1000 mg via INTRAVENOUS
  Filled 2019-11-28: qty 200

## 2019-11-28 MED ORDER — DILTIAZEM HCL 25 MG/5ML IV SOLN
INTRAVENOUS | Status: AC
  Administered 2019-11-28: 5 mg
  Filled 2019-11-28: qty 5

## 2019-11-28 MED ORDER — FENTANYL 2500MCG IN NS 250ML (10MCG/ML) PREMIX INFUSION
0.0000 ug/h | INTRAVENOUS | Status: DC
  Administered 2019-11-29 (×3): 300 ug/h via INTRAVENOUS
  Administered 2019-11-30: 250 ug/h via INTRAVENOUS
  Filled 2019-11-28 (×4): qty 250

## 2019-11-28 MED ORDER — POTASSIUM CHLORIDE 10 MEQ/50ML IV SOLN
10.0000 meq | INTRAVENOUS | Status: AC
  Administered 2019-11-28 (×3): 10 meq via INTRAVENOUS
  Filled 2019-11-28 (×3): qty 50

## 2019-11-28 MED ORDER — FENTANYL CITRATE (PF) 100 MCG/2ML IJ SOLN: 50.0000 ug | Freq: Once | INTRAMUSCULAR | Status: DC

## 2019-11-28 MED ORDER — DILTIAZEM HCL-DEXTROSE 125-5 MG/125ML-% IV SOLN (PREMIX)
5.0000 mg/h | INTRAVENOUS | Status: DC
  Administered 2019-11-28: 5 mg/h via INTRAVENOUS

## 2019-11-28 MED ORDER — NOREPINEPHRINE 16 MG/250ML-% IV SOLN
0.0000 ug/min | INTRAVENOUS | Status: DC
  Administered 2019-11-28: 25 ug/min via INTRAVENOUS
  Administered 2019-11-29: 18 ug/min via INTRAVENOUS
  Administered 2019-11-30: 9 ug/min via INTRAVENOUS
  Administered 2019-12-02: 2 ug/min via INTRAVENOUS
  Administered 2019-12-03: 4 ug/min via INTRAVENOUS
  Filled 2019-11-28 (×4): qty 250

## 2019-11-28 MED ORDER — DOCUSATE SODIUM 100 MG PO CAPS: 100.0000 mg | ORAL_CAPSULE | Freq: Two times a day (BID) | ORAL | Status: DC | PRN

## 2019-11-28 MED ORDER — CHLORHEXIDINE GLUCONATE 0.12% ORAL RINSE (MEDLINE KIT)
15.0000 mL | Freq: Two times a day (BID) | OROMUCOSAL | Status: DC
  Administered 2019-11-28 – 2019-12-16 (×27): 15 mL via OROMUCOSAL

## 2019-11-28 MED ORDER — NOREPINEPHRINE 4 MG/250ML-% IV SOLN
2.0000 ug/min | INTRAVENOUS | Status: DC
  Administered 2019-11-28: 25 ug/min via INTRAVENOUS
  Filled 2019-11-28: qty 250

## 2019-11-28 MED ORDER — CALCIUM CHLORIDE 10 % IV SOLN
1.0000 g | Freq: Once | INTRAVENOUS | Status: AC
  Administered 2019-11-28: 1 g via INTRAVENOUS

## 2019-11-28 MED ORDER — SODIUM CHLORIDE 0.9 % IV SOLN: 250.0000 mL | INTRAVENOUS | Status: DC

## 2019-11-28 MED ORDER — AMIODARONE HCL IN DEXTROSE 360-4.14 MG/200ML-% IV SOLN
30.0000 mg/h | INTRAVENOUS | Status: DC
  Administered 2019-11-28 – 2019-12-10 (×25): 30 mg/h via INTRAVENOUS
  Filled 2019-11-28 (×24): qty 200

## 2019-11-28 MED ORDER — POLYETHYLENE GLYCOL 3350 17 G PO PACK: 17.0000 g | PACK | Freq: Every day | ORAL | Status: DC | PRN

## 2019-11-28 MED ORDER — AMIODARONE HCL IN DEXTROSE 360-4.14 MG/200ML-% IV SOLN
60.0000 mg/h | INTRAVENOUS | Status: AC
  Administered 2019-11-28: 60 mg/h via INTRAVENOUS

## 2019-11-28 MED ORDER — HEPARIN SODIUM (PORCINE) 5000 UNIT/ML IJ SOLN
5000.0000 [IU] | Freq: Three times a day (TID) | INTRAMUSCULAR | Status: DC
  Administered 2019-11-28: 5000 [IU] via SUBCUTANEOUS
  Filled 2019-11-28: qty 1

## 2019-11-28 MED ORDER — FAMOTIDINE IN NACL 20-0.9 MG/50ML-% IV SOLN
20.0000 mg | Freq: Two times a day (BID) | INTRAVENOUS | Status: DC
  Administered 2019-11-28 – 2019-11-29 (×2): 20 mg via INTRAVENOUS
  Filled 2019-11-28 (×2): qty 50

## 2019-11-28 MED ORDER — SODIUM BICARBONATE-DEXTROSE 150-5 MEQ/L-% IV SOLN
150.0000 meq | INTRAVENOUS | Status: DC
  Administered 2019-11-28 – 2019-12-01 (×5): 150 meq via INTRAVENOUS
  Filled 2019-11-28 (×6): qty 1000

## 2019-11-28 MED ORDER — ORAL CARE MOUTH RINSE
15.0000 mL | OROMUCOSAL | Status: DC
  Administered 2019-11-28 – 2019-12-08 (×76): 15 mL via OROMUCOSAL

## 2019-11-28 MED ORDER — SODIUM CHLORIDE 0.9 % IV SOLN: INTRAVENOUS | Status: DC

## 2019-11-28 MED ORDER — PROPOFOL 1000 MG/100ML IV EMUL
25.0000 ug/kg/min | INTRAVENOUS | Status: DC
  Administered 2019-11-29 – 2019-11-30 (×3): 25 ug/kg/min via INTRAVENOUS
  Filled 2019-11-28 (×3): qty 100

## 2019-11-28 MED ORDER — NOREPINEPHRINE 4 MG/250ML-% IV SOLN
0.0000 ug/min | INTRAVENOUS | Status: DC
  Administered 2019-11-28: 2 ug/min via INTRAVENOUS
  Filled 2019-11-28: qty 250

## 2019-11-28 MED ORDER — ASPIRIN 300 MG RE SUPP: 300.0000 mg | RECTAL | Status: AC

## 2019-11-28 MED ORDER — POLYETHYLENE GLYCOL 3350 17 G PO PACK
17.0000 g | PACK | Freq: Every day | ORAL | Status: DC | PRN
  Administered 2019-12-07: 17 g
  Filled 2019-11-28: qty 1

## 2019-11-28 MED ORDER — SODIUM CHLORIDE 0.9 % IV SOLN
2.0000 g | Freq: Once | INTRAVENOUS | Status: AC
  Administered 2019-11-28: 2 g via INTRAVENOUS
  Filled 2019-11-28: qty 2

## 2019-11-28 MED ORDER — MAGNESIUM SULFATE 2 GM/50ML IV SOLN: 2.0000 g | Freq: Once | INTRAVENOUS | Status: AC

## 2019-11-28 NOTE — H&P (Signed)
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  Apparently patient lost pulse while having his the most recent dialysis session.  Patient had also developed A. fib RVR and was apneic in the field and required bag mask ventilation.  After arrival to the ER patient again coded x2 with V. fib arrest status post ACLS and ROSC, found to be amiodarone and 20 MCG Levophed.  Family at bedside.  Lines / Drains: Left upper extremity HD access, I/O positive  Cultures / Sepsis markers: Tracheal aspirate  Antibiotics: Vancomycin and cefepime   Protocols / Consultants: PCCM  Tests / Events: [facility]   PAST MEDICAL HISTORY   Past Medical History:  Diagnosis Date  . Cancer ([place])   . CHF (congestive heart failure) ([place])   . COPD (chronic obstructive pulmonary disease) ([place])   . Diabetes mellitus without complication ([place])   . Hypertension   . Renal disorder      SURGICAL HISTORY   Past Surgical History:  Procedure Laterality Date  . BACK SURGERY       FAMILY HISTORY   No family history on file.   SOCIAL HISTORY   Social History   Tobacco Use  . Smoking status: Former Smoker    Packs/day: 0.50    Years: 20.00     Pack years: 10.00    Types: Cigarettes    Quit date: 12/05/2014    Years since quitting: 4.9  . Smokeless tobacco: Never Used  . Tobacco comment: Quit 12/2014  Vaping Use  . Vaping Use: Never used  Substance Use Topics  . Alcohol use: No  . Drug use: No     MEDICATIONS   Current Medication:  Current Facility-Administered Medications:  .  amiodarone (NEXTERONE PREMIX) 360-4.14 MG/200ML-% (1.8 mg/mL) IV infusion, 60 mg/hr, Intravenous, Continuous, Nance Pear, MD .  amiodarone (NEXTERONE PREMIX) 360-4.14 MG/200ML-% (1.8 mg/mL) IV infusion, 30 mg/hr, Intravenous, Continuous, Nance Pear, MD, Last Rate: 16.67 mL/hr at 11/28/19 1600, 30 mg/hr at 11/28/19 1600 .  ceFEPIme (MAXIPIME) 2 g in sodium chloride 0.9 % 100 mL IVPB, 2 g, Intravenous, Once, Arta Silence, MD, Last Rate: 200 mL/hr at 11/28/19 1613, 2 g at 11/28/19 1613 .  diltiazem (CARDIZEM) 125 mg in dextrose 5% 125 mL (1 mg/mL) infusion, 5-15 mg/hr, Intravenous, Continuous, Arta Silence, MD, Stopped at 11/28/19 1401 .  fentaNYL 2561mg in NS 2546m(1089mml) infusion-PREMIX, 0-400 mcg/hr, Intravenous, Continuous, Siadecki, Sebastian, MD .  norepinephrine (LEVOPHED) 4mg48m 250mL72mmix infusion, 0-40 mcg/min, Intravenous, Continuous, Siadecki, SebasFelix Ahmadi Last Rate: 56.3 mL/hr at 11/28/19 1556, 15 mcg/min at 11/28/19 1556 .  vancomycin (VANCOCIN) IVPB 1000 mg/200 mL premix, 1,000 mg, Intravenous, Once, SiadeArta Silence Current Outpatient Medications:  .  amLODipine (NORVASC) 2.5 MG tablet, Take 2.5 mg by mouth daily. , Disp: , Rfl:  .  aspirin 81 MG chewable tablet, Chew 81 mg by mouth daily. , Disp: , Rfl:  .  atorvastatin (LIPITOR) 80  MG tablet, Take 80 mg by mouth daily. , Disp: , Rfl:  .  B Complex-C-Folic Acid (TRIPHROCAPS) 1 MG CAPS, Take 1 capsule by mouth daily., Disp: , Rfl:  .  Cholecalciferol (VITAMIN D3) 1000 units CAPS, Take 1,000 Units by mouth daily. , Disp: , Rfl:  .  clopidogrel  (PLAVIX) 75 MG tablet, Take 75 mg by mouth daily. , Disp: , Rfl:  .  ferrous sulfate 325 (65 FE) MG EC tablet, Take 325 mg by mouth daily with breakfast., Disp: , Rfl:  .  GLIPIZIDE XL 10 MG 24 hr tablet, Take 10 mg by mouth daily with breakfast. , Disp: , Rfl:  .  hydrALAZINE (APRESOLINE) 25 MG tablet, Take 25 mg by mouth 3 (three) times daily. , Disp: , Rfl:  .  isosorbide dinitrate (ISORDIL) 20 MG tablet, Take 20 mg by mouth 3 (three) times daily., Disp: , Rfl:  .  metoprolol succinate (TOPROL-XL) 25 MG 24 hr tablet, Take 25 mg by mouth daily. , Disp: , Rfl:  .  mirtazapine (REMERON) 7.5 MG tablet, Take 7.5 mg by mouth at bedtime. , Disp: , Rfl:  .  pantoprazole (PROTONIX) 40 MG tablet, Take 40 mg by mouth daily., Disp: , Rfl:  .  sevelamer carbonate (RENVELA) 800 MG tablet, Take 800-1,600 mg by mouth in the morning, at noon, in the evening, and at bedtime., Disp: , Rfl:  .  torsemide (DEMADEX) 20 MG tablet, Take 20 mg by mouth daily. , Disp: , Rfl:  .  glucagon (GLUCAGON EMERGENCY) 1 MG injection, as needed. Reported on 06/02/2015, Disp: , Rfl:  .  glucose blood (PRECISION QID TEST) test strip, Accu check Aviva strips, use twice daily, Disp: , Rfl:  .  nitroGLYCERIN (NITROSTAT) 0.3 MG SL tablet, Place 0.3 mg under the tongue every 5 (five) minutes as needed for chest pain. , Disp: , Rfl:     ALLERGIES   Ace inhibitors, Gabapentin, and Losartan    REVIEW OF SYSTEMS     Unable to obtain due to comatose state on MV  PHYSICAL EXAMINATION   Vital Signs: Temp:  [95.8 F (35.4 C)] 95.8 F (35.4 C) (06/24 1454) Pulse Rate:  [44-158] 44 (06/24 1550) Resp:  [7-31] 18 (06/24 1552) BP: (44-151)/(33-96) 84/61 (06/24 1552) SpO2:  [80 %-97 %] 82 % (06/24 1550) FiO2 (%):  [50 %] 50 % (06/24 1400)  GENERAL:chronically ill appearing HEAD: Normocephalic, atraumatic.  EYES: Pupils equal, round, reactive to light.  No scleral icterus. Bright red blood per left nare with intranasal  compression rocket.  MOUTH: Moist mucosal membrane. NECK: Supple. No thyromegaly. No nodules. No JVD.  PULMONARY: bibasilar crackles CARDIOVASCULAR: S1 and S2. Regular rate and rhythm. No murmurs, rubs, or gallops.  GASTROINTESTINAL: Soft, nontender, non-distended. No masses. Positive bowel sounds. No hepatosplenomegaly.  MUSCULOSKELETAL: No swelling, clubbing, or edema.  NEUROLOGIC: Mild distress due to acute illness SKIN:intact,warm,dry   PERTINENT DATA     Infusions: . amiodarone    . amiodarone 30 mg/hr (11/28/19 1600)  . ceFEPime (MAXIPIME) IV 2 g (11/28/19 1613)  . diltiazem (CARDIZEM) infusion Stopped (11/28/19 1401)  . fentaNYL infusion INTRAVENOUS    . norepinephrine (LEVOPHED) Adult infusion 15 mcg/min (11/28/19 1556)  . vancomycin     Scheduled Medications:  PRN Medications:  Hemodynamic parameters:   Intake/Output: No intake/output data recorded.  Ventilator  Settings: Vent Mode: AC FiO2 (%):  [50 %] 50 % Set Rate:  [14 bmp] 14 bmp Vt Set:  [400 mL] 400  mL PEEP:  [5 cmH20] 5 cmH20    LAB RESULTS:  Basic Metabolic Panel: Recent Labs  Lab 11/28/19 1331  NA 141  K 3.5  CL 102  CO2 24  GLUCOSE 163*  BUN 25*  CREATININE 4.37*  CALCIUM 8.9   Liver Function Tests: Recent Labs  Lab 11/28/19 1331  AST 343*  ALT 300*  ALKPHOS 65  BILITOT 1.5*  PROT 5.8*  ALBUMIN 3.3*   No results for input(s): LIPASE, AMYLASE in the last 168 hours. No results for input(s): AMMONIA in the last 168 hours. CBC: Recent Labs  Lab 11/28/19 1331  WBC 10.1  NEUTROABS 6.9  HGB 9.2*  HCT 25.8*  MCV 92.1  PLT 154   Cardiac Enzymes: No results for input(s): CKTOTAL, CKMB, CKMBINDEX, TROPONINI in the last 168 hours. BNP: Invalid input(s): POCBNP CBG: No results for input(s): GLUCAP in the last 168 hours.     IMAGING RESULTS:  Imaging: DG Chest Portable 1 View  Result Date: 11/28/2019 CLINICAL DATA:  Status post cardiac arrest during dialysis. EXAM:  PORTABLE CHEST 1 VIEW COMPARISON:  Chest x-ray dated 07/19/2017 FINDINGS: Endotracheal tube in good position 4.7 cm above the carina. Heart size is normal. Slight bilateral haziness, left greater than right, likely represents mild edema. No consolidative infiltrates or effusions. No bone abnormality. IMPRESSION: 1. Endotracheal tube in good position. 2. Mild pulmonary edema. Electronically Signed   By: Lorriane Shire M.D.   On: 11/28/2019 14:33   @PROBHOSP @ DG Chest Portable 1 View  Result Date: 11/28/2019 CLINICAL DATA:  Status post cardiac arrest during dialysis. EXAM: PORTABLE CHEST 1 VIEW COMPARISON:  Chest x-ray dated 07/19/2017 FINDINGS: Endotracheal tube in good position 4.7 cm above the carina. Heart size is normal. Slight bilateral haziness, left greater than right, likely represents mild edema. No consolidative infiltrates or effusions. No bone abnormality. IMPRESSION: 1. Endotracheal tube in good position. 2. Mild pulmonary edema. Electronically Signed   By: Lorriane Shire M.D.   On: 11/28/2019 14:33     ASSESSMENT AND PLAN    -Met with wife at bedside to explain patient is severely ill with very poor prognosis post cardiac arrest X3 and recommendation for DNR comfort care.   Acutely comatose state  -due VF cardiac arrest  -unknown time down prior to being found pulseless - will obtain CTH and initiate Arctic sun hypothermia protocol  -continue Amiodarote and levophed gtt at this time   Cardiac Arrest due Ventricular fibrillation -s/p amiodarone gtt Magnesium bolus 2g x 1 S/p ACLS x 3 -very poor prognosis --background of combined systolic and diastolic CHF with CAD   Acute hypoxemic respiratory failure  -treat underlying cause-cardiac arrest with advanced CHF/CAD/COPD/Sq cell CA of hypopharynx  - currently on MV via trache    Transaminitis   likely due to hypotension during cardiac arrest -decrease non-essential hepatotoxic medications  ESRD  - may resume HD if  patient survives and imporves   ID -continue IV abx as prescibed -follow up cultures  GI/Nutrition GI PROPHYLAXIS as indicated DIET-->TF's as tolerated Constipation protocol as indicated  ENDO - ICU hypoglycemic\Hyperglycemia protocol -check FSBS per protocol   ELECTROLYTES -follow labs as needed -replace as needed -pharmacy consultation   DVT/GI PRX ordered -SCDs  TRANSFUSIONS AS NEEDED MONITOR FSBS ASSESS the need for LABS as needed   Critical care provider statement:    Critical care time (minutes):  109   Critical care time was exclusive of:  Separately billable procedures and treating other patients  Critical care was necessary to treat or prevent imminent or life-threatening deterioration of the following conditions:  Cardiac arrest, ventricular fibrillation, ESRD, epistaxis, COPD, CHF,laryngectomy, tracheostomy, acute hypoxemic respiratory faliure   Critical care was time spent personally by me on the following activities:  Development of treatment plan with patient or surrogate, discussions with consultants, evaluation of patient's response to treatment, examination of patient, obtaining history from patient or surrogate, ordering and performing treatments and interventions, ordering and review of laboratory studies and re-evaluation of patient's condition.  I assumed direction of critical care for this patient from another provider in my specialty: no    This document was prepared using Dragon voice recognition software and may include unintentional dictation errors.    Ottie Glazier, M.D.  [facility]

## 2019-11-28 NOTE — Progress Notes (Signed)
PHARMACY -  BRIEF ANTIBIOTIC NOTE   Pharmacy has received consult(s) for vancomycin and cefepime from an ED provider.  The patient's profile has been reviewed for ht/wt/allergies/indication/available labs.    One time order(s) placed for vanc 1 g + cefepime 2 g  Further antibiotics/pharmacy consults should be ordered by admitting physician if indicated.                       Thank you,  Tawnya Crook, PharmD 11/28/2019  3:48 PM

## 2019-11-28 NOTE — ED Notes (Signed)
Warm blankets applied to pt per verbal from [place].

## 2019-11-28 NOTE — ED Notes (Signed)
4 of Versed given now per EDP Siadecki by Benay Pillow.

## 2019-11-28 NOTE — ED Notes (Signed)
Resp therapy back with vent. Switching pt over to vent now.

## 2019-11-28 NOTE — ED Notes (Signed)
This RN and resp therapy accompanying pt to CT then heading up to ICU room 17.

## 2019-11-28 NOTE — Procedures (Signed)
Central Venous Catheter Placement: TRIPLE LUMEN Indication: Patient receiving vesicant or irritant drug.; Patient receiving intravenous therapy for longer than 5 days.; Patient has limited or no vascular access.   Consent:emergent    Hand washing performed prior to starting the procedure.   Procedure:   An active timeout was performed and correct patient, name, & ID confirmed.   Patient was positioned correctly for central venous access.  Patient was prepped using strict sterile technique including chlorohexadine preps, sterile drape, sterile gown and sterile gloves.    The area was prepped, draped and anesthetized in the usual sterile manner. Patient comfort was obtained.    A triple lumen catheter was placed in Right femoral vein was good blood return, catheter caps were placed on lumens, catheter flushed easily, the line was secured and a sterile dressing and BIO-PATCH applied.   Ultrasound was used to visualize vasculature and guidance of needle.   Number of Attempts: 1 Complications:none Estimated Blood Loss: none Chest Radiograph indicated and ordered.     Ottie Glazier, M.D.  P[facility] & [place]

## 2019-11-28 NOTE — ED Notes (Signed)
Cardizem 5 given by Deneise Lever RN via verbal order from [place].

## 2019-11-28 NOTE — ED Notes (Signed)
EKG completed

## 2019-11-28 NOTE — ED Notes (Signed)
Epi 1 given per verbal from [place].

## 2019-11-28 NOTE — ED Notes (Signed)
Called Dialysis staff in request that pt's site at L upper arm be de-accessed. Stated they'll send someone to bedside soon.

## 2019-11-28 NOTE — ED Notes (Signed)
Epi 2 given again. CPR and manual bagging continued.

## 2019-11-28 NOTE — ED Notes (Signed)
Mag run with vanc as micromedex states compatibility.

## 2019-11-28 NOTE — ED Notes (Signed)
Pt pulseless when checked as monitor alarming and pt in Vfib. CRP initiated and Diplomatic Services operational officer and others to bedside.

## 2019-11-28 NOTE — Progress Notes (Signed)
Patient accepted from ED. Patient intubated and sedated with fentanyl.  Blood pressure unstable upon arrival and levophed drip increased to 52mcg.  Assessment complete, see flowsheets.

## 2019-11-28 NOTE — ED Notes (Signed)
2nd NS bolus started.

## 2019-11-28 NOTE — ED Notes (Addendum)
Calcium chloride 1g given per EDP Siadecki verbal order. [place] of tubes sent to lab.

## 2019-11-28 NOTE — ED Notes (Signed)
2nd shock  

## 2019-11-28 NOTE — ED Notes (Signed)
100 fentanyl given by Levada Dy RN per verbal from [place].

## 2019-11-28 NOTE — ED Notes (Addendum)
Pt repositioned; trendelenburg.

## 2019-11-28 NOTE — ED Notes (Signed)
1 set of blood cultures obtained by Deneise Lever RN at R wrist. Very little blood pulled back into each bottle as pt dry.

## 2019-11-28 NOTE — ED Notes (Signed)
Code cart, bag mask, glidoscope, [place], EDP Siadecki, resp therapy, annie RN, Architect, Airline pilot at bedside.

## 2019-11-28 NOTE — ED Notes (Signed)
80mEq of Bicarb given per EDP Siadecki verbal order.

## 2019-11-28 NOTE — ED Notes (Addendum)
Pulse palpable per EDP and 2nd RN. CPR stopped. RT at bedside to switch pt back to vent.

## 2019-11-28 NOTE — ED Notes (Signed)
Pt's wife at bedside.

## 2019-11-28 NOTE — Progress Notes (Signed)
Blood pressure unstable and levophed drip titrated up to 32mcg

## 2019-11-28 NOTE — ED Notes (Signed)
EDP Siadecki back to bedside.

## 2019-11-28 NOTE — ED Notes (Addendum)
Pt found to be Vfib on monitor with HR in 200s. This RN, Deneise Lever, RN, Metta Clines, RN and Dr. Archie Balboa at bedside. Respiratory called. Pt with no palpable pulse. Compressions started by Metta Clines, RN. Pt bagged by this RN.

## 2019-11-28 NOTE — ED Notes (Addendum)
EDP Siadecki notified of critical lactic 6.6. Suctioned pt orally and via in-line suction.

## 2019-11-28 NOTE — ED Notes (Signed)
Attempted I&O cath with Crissy RN. Pt dry; very minimal red tinged urine noted. Not enough to send sample to lab.

## 2019-11-28 NOTE — ED Notes (Signed)
1st shock.

## 2019-11-28 NOTE — ED Notes (Signed)
Patient's spouse unable to tolerate seeing patient being suctioned. She was placed in ED Family Consult room by this Tech until [place], RN and [place], RN have completed suctioning. Please retreive her as soon as possible.

## 2019-11-28 NOTE — Progress Notes (Signed)
Patient initiated on targeted temperature management protocol and arctic sun pads were applied at 2037 on 11/28/2019. The Induction Phase began at this time and patient began cooling to 36 C.  Patient then achieved a temperature of 36 C at 2120 on 11/28/2019. Maintenance Phase begun, and the patient will be controlled at 36 C for 24 hours (until 2120 on 11/29/2019). After this, the Warming Phase will begin.   Patient appears comfortable and is in no apparent distress. All vitals with exception to temperature are within normal limits. Will continue to monitor.  Cameron Ali, RN

## 2019-11-28 NOTE — ED Notes (Addendum)
EDP Siadecki packing pt's nose to help stop bleeding.

## 2019-11-28 NOTE — Progress Notes (Signed)
   11/28/19 1510  Clinical Encounter Type  Visited With Patient;Family  Visit Type Initial;Spiritual support;Social support  Referral From Nurse  Consult/Referral To Chaplain  Responded to a PG from Ed for a code stemi. Pt wife was sitting outside the room. Talk to Pt wife and entered the room with her. Pt was breathing on his on and had a strong heart rate. Ch heard the Dr say. Ch will follow-up with Pt and family.

## 2019-11-28 NOTE — Care Plan (Signed)
Family gathering at bedside to pray and make decisions regarding goals of care.    Ottie Glazier, M.D.  Pulmonary & [place]

## 2019-11-28 NOTE — ED Triage Notes (Addendum)
Pt in via EMS from dialysis; cardiac arrest; 2 shocks; Afib on initial EKG with EMS; history laryngectomy/laryngostomy; EMS placed trumpet; R humeral IO placed by EMS; 130HR per EMS; dialysis needle remains in place per EMS; pt being bagged by EMS.

## 2019-11-28 NOTE — ED Provider Notes (Signed)
Called into patient's room by nursing staff because of concerns for lack of pulse and abnormal rhythm.  Upon arrival to the patient's room he was found to be pulseless and in ventricular fibrillation.  CPR was initiated.  Please see nursing notes for timing of medications and cardioversion.  Patient did undergo 2 electric cardioversions during CPR with return of pulse.  Upon return of spontaneous circulation he was found to be in significant A. fib with RVR.  Per discussion with Dr. Cherylann Banas during signout the patient had a significant hypotension after diltiazem.  Because of this patient was given amiodarone to try to control the rate. Patient was transferred up to the ICU.  Cardiopulmonary Resuscitation (CPR) Procedure Note Directed/Performed by: Nance Pear I personally directed ancillary staff and/or performed CPR in an effort to regain return of spontaneous circulation and to maintain cardiac, neuro and systemic perfusion.   CRITICAL CARE Performed by: Nance Pear   Total critical care time: 30 minutes  Critical care time was exclusive of separately billable procedures and treating other patients.  Critical care was necessary to treat or prevent imminent or life-threatening deterioration.  Critical care was time spent personally by me on the following activities: development of treatment plan with patient and/or surrogate as well as nursing, discussions with consultants, evaluation of patient's response to treatment, examination of patient, obtaining history from patient or surrogate, ordering and performing treatments and interventions, ordering and review of laboratory studies, ordering and review of radiographic studies, pulse oximetry and re-evaluation of patient's condition.    Nance Pear, MD 11/28/19 1945

## 2019-11-28 NOTE — ED Notes (Signed)
Pt bleeding from nose. Resp therapy switching pt over to a trach; size 6.

## 2019-11-28 NOTE — ED Provider Notes (Signed)
[facility] Emergency Department Provider Note ____________________________________________   First MD Initiated Contact with Patient 11/28/19 1402     (approximate)  I have reviewed the triage vital signs and the nursing notes.   HISTORY  Chief Complaint No chief complaint on file.  Level 5 caveat: History present illness limited due to altered mental status  HPI Kerry [name] is a 86 y.o. 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  Per EMS, the patient was at dialysis and was found to have no pulse.  An AED was placed and advised shocked twice.  It is unknown what rhythm the patient was then at that time.  On EMS arrival, the patient had regained a pulse and was in rapid atrial fibrillation and was apneic, although was able to be ventilated via BVM.  The patient himself is unable to give any history.  Past Medical History:  Diagnosis Date  . Cancer ([place])   . CHF (congestive heart failure) ([place])   . COPD (chronic obstructive pulmonary disease) ([place])   . Diabetes mellitus without complication ([place])   . Hypertension   . Renal disorder     Patient Active Problem List   Diagnosis Date Noted  . Diverticulosis 05/25/2017  . Hyperlipidemia 05/25/2017  . Hypertension 05/25/2017  . Type 2 diabetes mellitus ([place]) 05/25/2017  . Anemia of chronic disease 04/17/2017  . History of tracheostomy 03/14/2017  . NSTEMI (non-ST elevated myocardial infarction) ([place]) 03/13/2017  . 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 Low back pain 11/23/2016  . Vitamin D insufficiency 06/28/2016  . CKD (chronic kidney disease) stage 4, GFR 15-29 ml/min (HCC) 04/05/2016  . Coronary artery disease 02/09/2016  . Heart failure with preserved ejection fraction ([place]) 02/09/2016  . 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 History of hyperkalemia  02/12/2015  . 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 Hypopharyngeal mass 11/28/2014  . Vocal cord paralysis 11/28/2014  . Sleep apnea 12/14/2011  . REM behavioral disorder 12/14/2011  . Smoker 07/22/2011    Past Surgical History:  Procedure Laterality Date  . BACK SURGERY      Prior to Admission medications   Medication Sig Start Date End Date Taking? Authorizing Provider  amLODipine (NORVASC) 2.5 MG tablet Take 2.5 mg by mouth daily.  05/18/17  Yes [provider]  aspirin 81 MG chewable tablet Chew 81 mg by mouth daily.  03/15/17  Yes [provider]  atorvastatin (LIPITOR) 80 MG tablet Take 80 mg by mouth daily.  03/15/17  Yes [provider]  B Complex-C-Folic Acid (TRIPHROCAPS) 1 MG CAPS Take 1 capsule by mouth daily. 09/30/19  Yes [provider]  Cholecalciferol (VITAMIN D3) 1000 units CAPS Take 1,000 Units by mouth daily.    Yes [provider]  clopidogrel (PLAVIX) 75 MG tablet Take 75 mg by mouth daily.  05/10/17  Yes [provider]  ferrous sulfate 325 (65 FE) MG EC tablet Take 325 mg by mouth daily with breakfast.   Yes [provider]  GLIPIZIDE XL 10 MG 24 hr tablet Take 10 mg by mouth daily with breakfast.  05/05/17  Yes [provider]  hydrALAZINE (APRESOLINE) 25 MG tablet Take 25 mg by mouth 3 (three) times daily.  03/01/17  Yes [provider]  isosorbide dinitrate (ISORDIL) 20 MG tablet Take 20 mg by  mouth 3 (three) times daily.   Yes [provider]  metoprolol succinate (TOPROL-XL) 25 MG 24 hr tablet Take 25 mg by mouth daily.  03/01/17  Yes [provider]  mirtazapine (REMERON) 7.5 MG tablet Take 7.5 mg by mouth at bedtime.  05/11/17  Yes [provider]  pantoprazole (PROTONIX) 40 MG tablet Take 40 mg by mouth daily. 11/21/19  Yes [provider]  sevelamer carbonate (RENVELA) 800 MG tablet Take 800-1,600 mg by mouth in  the morning, at noon, in the evening, and at bedtime. 11/14/19  Yes [provider]  torsemide (DEMADEX) 20 MG tablet Take 20 mg by mouth daily.  05/15/17  Yes [provider]  glucagon (GLUCAGON EMERGENCY) 1 MG injection as needed. Reported on 06/02/2015 01/19/15   [provider]  glucose blood (PRECISION QID TEST) test strip Accu check Aviva strips, use twice daily 01/16/15   [provider]  nitroGLYCERIN (NITROSTAT) 0.3 MG SL tablet Place 0.3 mg under the tongue every 5 (five) minutes as needed for chest pain.  03/16/17   [provider]    Allergies Ace inhibitors, Gabapentin, and Losartan  No family history on file.  Social History Social History   Tobacco Use  . Smoking status: Former Smoker    Packs/day: 0.50    Years: 20.00    Pack years: 10.00    Types: Cigarettes    Quit date: 12/05/2014    Years since quitting: 4.9  . Smokeless tobacco: Never Used  . Tobacco comment: Quit 12/2014  Vaping Use  . Vaping Use: Never used  Substance Use Topics  . Alcohol use: No  . Drug use: No    Review of Systems Level 5 caveat: Unable to obtain review of systems due to altered mental status   ____________________________________________   PHYSICAL EXAM:  VITAL SIGNS: ED Triage Vitals  Enc Vitals Group     BP 11/28/19 1332 135/87     Pulse Rate 11/28/19 1332 61     Resp 11/28/19 1332 13     Temp 11/28/19 1454 (!) 95.8 F (35.4 C)     Temp Source 11/28/19 1454 Rectal     SpO2 11/28/19 1330 97 %     Weight --      Height --      Head Circumference --      Peak Flow --      Pain Score --      Pain Loc --      Pain Edu? --      Excl. in [place]? --     Constitutional: Unresponsive.  Breathing spontaneously. Eyes: Conjunctivae are normal.  PERRLA. Head: Atraumatic. Nose: No congestion/rhinnorhea.  Bleeding from the left nare after nasal trumpet removed. Mouth/Throat: Mucous membranes are moist.   Neck: Normal range of motion.   Laryngostomy in place. Cardiovascular: Tachycardic, irregular rhythm. Grossly normal heart sounds.  Good peripheral circulation. Respiratory: Decreased respiratory effort. Lungs CTAB. Gastrointestinal: No distention.  Genitourinary: No flank tenderness. Musculoskeletal: Extremities warm and well perfused.  Neurologic: Unresponsive. Skin:  Skin is warm and dry. No rash noted. Psychiatric: Unable to assess.  ____________________________________________   LABS (all labs ordered are listed, but only abnormal results are displayed)  Labs Reviewed  COMPREHENSIVE METABOLIC PANEL - Abnormal; Notable for the following components:      Result Value   Glucose, Bld 163 (*)    BUN 25 (*)    Creatinine, Ser 4.37 (*)    Total Protein 5.8 (*)  Albumin 3.3 (*)    AST 343 (*)    ALT 300 (*)    Total Bilirubin 1.5 (*)    GFR calc non Af Amer 12 (*)    GFR calc Af Amer 13 (*)    All other components within normal limits  LACTIC ACID, PLASMA - Abnormal; Notable for the following components:   Lactic Acid, Venous 6.6 (*)    All other components within normal limits  CBC WITH DIFFERENTIAL/PLATELET - Abnormal; Notable for the following components:   RBC 2.80 (*)    Hemoglobin 9.2 (*)    HCT 25.8 (*)    RDW 16.1 (*)    Abs Immature Granulocytes 0.11 (*)    All other components within normal limits  BRAIN NATRIURETIC PEPTIDE - Abnormal; Notable for the following components:   B Natriuretic Peptide 4,339.6 (*)    All other components within normal limits  TROPONIN I (HIGH SENSITIVITY) - Abnormal; Notable for the following components:   Troponin I (High Sensitivity) 45 (*)    All other components within normal limits  CULTURE, BLOOD (ROUTINE X 2)  CULTURE, BLOOD (ROUTINE X 2)  PROTIME-INR  LACTIC ACID, PLASMA  URINALYSIS, COMPLETE (UACMP) WITH MICROSCOPIC  TROPONIN I (HIGH SENSITIVITY)   ____________________________________________  EKG  ED ECG REPORT I, Arta Silence, the  attending physician, personally viewed and interpreted this ECG.  Date: 11/28/2019 EKG Time: 1332 Rate: 85 Rhythm: Sinus tachycardia with PVCs QRS Axis: normal Intervals: normal ST/T Wave abnormalities: Nonspecific abnormality Narrative Interpretation: no evidence of acute ischemia  ED ECG REPORT I, Arta Silence, the attending physician, personally viewed and interpreted this ECG.  Date: 11/28/2019 EKG Time: 1335 Rate: 185 Rhythm: SVT versus atrial fibrillation with RVR ST/T Wave abnormalities: Nonspecific abnormalities Narrative Interpretation: Narrow complex tachycardia  ____________________________________________  RADIOLOGY  CXR: No focal infiltrate.  Mild edema.  Tracheostomy tube in good position.  ____________________________________________   PROCEDURES  Procedure(s) performed: No  Procedures  Critical Care performed: Yes  CRITICAL CARE Performed by: Arta Silence   Total critical care time: 75 minutes  Critical care time was exclusive of separately billable procedures and treating other patients.  Critical care was necessary to treat or prevent imminent or life-threatening deterioration.  Critical care was time spent personally by me on the following activities: development of treatment plan with patient and/or surrogate as well as nursing, discussions with consultants, evaluation of patient's response to treatment, examination of patient, obtaining history from patient or surrogate, ordering and performing treatments and interventions, ordering and review of laboratory studies, ordering and review of radiographic studies, pulse oximetry and re-evaluation of patient's condition. ____________________________________________   INITIAL IMPRESSION / ASSESSMENT AND PLAN / ED COURSE  Pertinent labs & imaging results that were available during my care of the patient were reviewed by me and considered in my medical decision making (see chart for  details).  86 year old male with PMH as noted above including ESRD on dialysis presented to the ED after an episode of cardiac arrest while at dialysis.  The initial rhythm is unknown, the patient was shocked by in the ED twice.  On ED arrival, the patient was apparently in sinus tachycardia with frequent PVCs.  He was unresponsive but with spontaneous respirations.  He was being ventilated well via BVM.  Blood pressure was stable.  I initially gave calcium gluconate and sodium bicarb in case there was a component of hyperkalemia.  Respiratory assisted in placing a tracheostomy tube in the patient's laryngectomy, and he was  successfully ventilated through this with good O2 saturation.  After being placed on the monitor, the patient developed a narrow complex tachycardia with a rate in the 150s to 180s which was irregular consistent with atrial fibrillation with RVR.  His blood pressure initially remained stable.  The rate came down slightly with a dose of Cardizem, so I initiated a Cardizem infusion.  The rate improved to the 120s, however the patient became significantly hypotensive.  Cardizem was discontinued, and the rate remained between 90 and 120 but with persistent hypotension.  He had minimal improvement with 2 L of normal saline, and so a Levophed infusion was started.  Overall, the etiology of the patient's cardiac arrest is unclear.  Differential includes acute MI/CHF exacerbation with cardiogenic shock, hypovolemia, sepsis, electrolyte abnormality, or other metabolic cause.  We will continue to monitor the blood pressure closely, and plan for admission to ICU.  ----------------------------------------- 4:05 PM on 11/28/2019 -----------------------------------------  I discussed the case with Dr. Lanney Gins from the ICU who agrees with admission.  Chest x-ray shows no acute findings.  Lab work-up is significant for an elevated lactate which was obtained prior to fluids being given.  The  patient's electrolytes are reassuring.  BNP is significantly elevated.  Blood pressure has improved after fluids and Levophed.  I ordered broad-spectrum antibiotics due to elevated lactate.  The patient will be admitted to the ICU.  ____________________________________________   FINAL CLINICAL IMPRESSION(S) / ED DIAGNOSES  Final diagnoses:  Cardiac arrest ([place])  Atrial fibrillation with RVR ([place])  Altered mental status, unspecified altered mental status type      NEW MEDICATIONS STARTED DURING THIS VISIT:  New Prescriptions   No medications on file     Note:  This document was prepared using Dragon voice recognition software and may include unintentional dictation errors.    Arta Silence, MD 11/28/19 1606

## 2019-11-28 NOTE — ED Notes (Signed)
150 amio given.

## 2019-11-29 DIAGNOSIS — I469 Cardiac arrest, cause unspecified: Secondary | ICD-10-CM

## 2019-11-29 DIAGNOSIS — R4182 Altered mental status, unspecified: Secondary | ICD-10-CM

## 2019-11-29 LAB — BASIC METABOLIC PANEL
Anion gap: 20 — ABNORMAL HIGH (ref 5–15)
Anion gap: 14 (ref 5–15)
BUN: 34 mg/dL — ABNORMAL HIGH (ref 8–23)
BUN: 38 mg/dL — ABNORMAL HIGH (ref 8–23)
CO2: 25 mmol/L (ref 22–32)
CO2: 31 mmol/L (ref 22–32)
Calcium: 8 mg/dL — ABNORMAL LOW (ref 8.9–10.3)
Calcium: 7.6 mg/dL — ABNORMAL LOW (ref 8.9–10.3)
Chloride: 103 mmol/L (ref 98–111)
Chloride: 99 mmol/L (ref 98–111)
Creatinine, Ser: 4.97 mg/dL — ABNORMAL HIGH (ref 0.61–1.24)
Creatinine, Ser: 5.57 mg/dL — ABNORMAL HIGH (ref 0.61–1.24)
GFR calc Af Amer: 11 mL/min — ABNORMAL LOW (ref >60)
GFR calc Af Amer: 10 mL/min — ABNORMAL LOW (ref >60)
GFR calc non Af Amer: 10 mL/min — ABNORMAL LOW (ref >60)
GFR calc non Af Amer: 9 mL/min — ABNORMAL LOW (ref >60)
Glucose, Bld: 326 mg/dL — ABNORMAL HIGH (ref 70–99)
Glucose, Bld: 281 mg/dL — ABNORMAL HIGH (ref 70–99)
Potassium: 4.7 mmol/L (ref 3.5–5.1)
Potassium: 3.9 mmol/L (ref 3.5–5.1)
Sodium: 148 mmol/L — ABNORMAL HIGH (ref 135–145)
Sodium: 144 mmol/L (ref 135–145)

## 2019-11-29 LAB — BLOOD GAS, ARTERIAL
Acid-base deficit: 6.7 mmol/L — ABNORMAL HIGH (ref 0.0–2.0)
Acid-base deficit: 8.1 mmol/L — ABNORMAL HIGH (ref 0.0–2.0)
Bicarbonate: 19.7 mmol/L — ABNORMAL LOW (ref 20.0–28.0)
Bicarbonate: 19.5 mmol/L — ABNORMAL LOW (ref 20.0–28.0)
FIO2: 0.4
FIO2: 0.4
MECHVT: 400 mL
MECHVT: 400 mL
Mechanical Rate: 14
O2 Saturation: 98.7 %
O2 Saturation: 76.2 %
PEEP: 5 cmH2O
PEEP: 5 cmH2O
Patient temperature: 36
Patient temperature: 37
RATE: 14 resp/min
RATE: 14 resp/min
pCO2 arterial: 41 mmHg (ref 32.0–48.0)
pCO2 arterial: 50 mmHg — ABNORMAL HIGH (ref 32.0–48.0)
pH, Arterial: 7.28 — ABNORMAL LOW (ref 7.350–7.450)
pH, Arterial: 7.2 — ABNORMAL LOW (ref 7.350–7.450)
pO2, Arterial: 129 mmHg — ABNORMAL HIGH (ref 83.0–108.0)
pO2, Arterial: 51 mmHg — ABNORMAL LOW (ref 83.0–108.0)

## 2019-11-29 LAB — CBC WITH DIFFERENTIAL/PLATELET
Abs Immature Granulocytes: 0.15 10*3/uL — ABNORMAL HIGH (ref 0.00–0.07)
Basophils Absolute: 0 10*3/uL (ref 0.0–0.1)
Basophils Relative: 0 %
Eosinophils Absolute: 0 10*3/uL (ref 0.0–0.5)
Eosinophils Relative: 0 %
HCT: 20.7 % — ABNORMAL LOW (ref 39.0–52.0)
Hemoglobin: 7.4 g/dL — ABNORMAL LOW (ref 13.0–17.0)
Immature Granulocytes: 1 %
Lymphocytes Relative: 3 %
Lymphs Abs: 0.6 10*3/uL — ABNORMAL LOW (ref 0.7–4.0)
MCH: 32.3 pg (ref 26.0–34.0)
MCHC: 35.7 g/dL (ref 30.0–36.0)
MCV: 90.4 fL (ref 80.0–100.0)
Monocytes Absolute: 0.6 10*3/uL (ref 0.1–1.0)
Monocytes Relative: 3 %
Neutro Abs: 18.6 10*3/uL — ABNORMAL HIGH (ref 1.7–7.7)
Neutrophils Relative %: 93 %
Platelets: 139 10*3/uL — ABNORMAL LOW (ref 150–400)
RBC: 2.29 MIL/uL — ABNORMAL LOW (ref 4.22–5.81)
RDW: 16 % — ABNORMAL HIGH (ref 11.5–15.5)
WBC: 20 10*3/uL — ABNORMAL HIGH (ref 4.0–10.5)
nRBC: 0 % (ref 0.0–0.2)

## 2019-11-29 LAB — TROPONIN I (HIGH SENSITIVITY): Troponin I (High Sensitivity): 943 ng/L (ref <18)

## 2019-11-29 LAB — PHOSPHORUS: Phosphorus: 5.4 mg/dL — ABNORMAL HIGH (ref 2.5–4.6)

## 2019-11-29 LAB — GLUCOSE, CAPILLARY
Glucose-Capillary: 142 mg/dL — ABNORMAL HIGH (ref 70–99)
Glucose-Capillary: 317 mg/dL — ABNORMAL HIGH (ref 70–99)
Glucose-Capillary: 324 mg/dL — ABNORMAL HIGH (ref 70–99)
Glucose-Capillary: 220 mg/dL — ABNORMAL HIGH (ref 70–99)
Glucose-Capillary: 248 mg/dL — ABNORMAL HIGH (ref 70–99)

## 2019-11-29 LAB — APTT: aPTT: >160 seconds (ref 24–36)

## 2019-11-29 LAB — MAGNESIUM: Magnesium: 2.2 mg/dL (ref 1.7–2.4)

## 2019-11-29 LAB — TRIGLYCERIDES: Triglycerides: 69 mg/dL (ref <150)

## 2019-11-29 LAB — HEPARIN LEVEL (UNFRACTIONATED): Heparin Unfractionated: 0.42 IU/mL (ref 0.30–0.70)

## 2019-11-29 MED ORDER — HEPARIN (PORCINE) 25000 UT/250ML-% IV SOLN
700.0000 [IU]/h | INTRAVENOUS | Status: DC
  Administered 2019-11-29: 700 [IU]/h via INTRAVENOUS
  Filled 2019-11-29: qty 250

## 2019-11-29 MED ORDER — CHLORHEXIDINE GLUCONATE CLOTH 2 % EX PADS
6.0000 | MEDICATED_PAD | Freq: Every day | CUTANEOUS | Status: DC
  Administered 2019-11-30 – 2019-12-17 (×16): 6 via TOPICAL

## 2019-11-29 MED ORDER — INSULIN ASPART 100 UNIT/ML ~~LOC~~ SOLN
0.0000 [IU] | SUBCUTANEOUS | Status: DC
  Administered 2019-11-29 (×2): 2 [IU] via SUBCUTANEOUS
  Administered 2019-11-29: 4 [IU] via SUBCUTANEOUS
  Administered 2019-11-29: 1 [IU] via SUBCUTANEOUS
  Administered 2019-11-30: 2 [IU] via SUBCUTANEOUS
  Administered 2019-11-30 (×3): 1 [IU] via SUBCUTANEOUS
  Administered 2019-12-01: 2 [IU] via SUBCUTANEOUS
  Administered 2019-12-01 – 2019-12-04 (×6): 1 [IU] via SUBCUTANEOUS
  Administered 2019-12-04: 2 [IU] via SUBCUTANEOUS
  Administered 2019-12-04 (×2): 1 [IU] via SUBCUTANEOUS
  Administered 2019-12-05: 2 [IU] via SUBCUTANEOUS
  Filled 2019-11-29 (×18): qty 1

## 2019-11-29 MED ORDER — CHLORHEXIDINE GLUCONATE CLOTH 2 % EX PADS
6.0000 | MEDICATED_PAD | Freq: Every day | CUTANEOUS | Status: DC
  Administered 2019-11-29: 6 via TOPICAL

## 2019-11-29 MED ORDER — HEPARIN BOLUS VIA INFUSION
1000.0000 [IU] | Freq: Once | INTRAVENOUS | Status: AC
  Administered 2019-11-29: 1000 [IU] via INTRAVENOUS
  Filled 2019-11-29: qty 1000

## 2019-11-29 MED ORDER — SODIUM BICARBONATE 8.4 % IV SOLN
100.0000 meq | Freq: Once | INTRAVENOUS | Status: AC
  Administered 2019-11-29: 100 meq via INTRAVENOUS
  Filled 2019-11-29: qty 100

## 2019-11-29 MED ORDER — FAMOTIDINE IN NACL 20-0.9 MG/50ML-% IV SOLN
20.0000 mg | INTRAVENOUS | Status: DC
  Administered 2019-11-30 – 2019-12-11 (×12): 20 mg via INTRAVENOUS
  Filled 2019-11-29 (×12): qty 50

## 2019-11-29 NOTE — Progress Notes (Signed)
eeg completed ° °

## 2019-11-29 NOTE — Progress Notes (Signed)
Spoke with Dr Ubaldo Glassing. When suctioning patient orally blood is present. Per Dr. Ubaldo Glassing orders to discontinue heparin drip. At this time he does not want to order other prophylaxis. He will re-assess tomorrow.

## 2019-11-29 NOTE — Progress Notes (Signed)
Patient initiated on heparin at 700 units/hr due to increasing troponin levels.  Patient now responds to voice but is still unable to follow commands. Fentanyl increased to 300 mcg/hr for comfort. Will continue to monitor.  Cameron Ali, RN

## 2019-11-29 NOTE — Progress Notes (Signed)
[place] for Heparin Indication: ACS/STEMI  Allergies  Allergen Reactions  . Ace Inhibitors Other (See Comments)    Potassium elevated  . Gabapentin Other (See Comments)    Ataxia at 100 mg.  . Losartan Other (See Comments)    Hyperkalemia    Patient Measurements: Height: 5\' 6"  (167.6 cm) Weight: 67.2 kg (148 lb 2.4 oz) IBW/kg (Calculated) : 63.8 HEPARIN DW (KG): 60  Vital Signs: Temp: 96.6 F (35.9 C) (06/25 1300) Temp Source: Rectal (06/25 0700) BP: 115/59 (06/25 1200) Pulse Rate: 76 (06/25 1200)  Labs: Recent Labs    11/28/19 1331 11/28/19 1604 11/28/19 1638 11/28/19 1919 11/28/19 2043 11/28/19 2226 11/29/19 0020 11/29/19 0610 11/29/19 1037  HGB 9.2*  --   --   --   --   --   --  7.4*  --   HCT 25.8*  --   --   --   --   --   --  20.7*  --   PLT 154  --   --   --   --   --   --  139*  --   APTT  --   --   --   --  42*  --   --   --  >160*  LABPROT 14.3  --   --   --  17.8*  --   --   --   --   INR 1.2  --   --   --  1.5*  --   --   --   --   HEPARINUNFRC  --   --   --   --   --   --   --   --  0.42  CREATININE 4.37*   < > 3.90* 4.68*  --   --   --  4.97*  --   TROPONINIHS 45*   < >  --  242*  --  630* 943*  --   --    < > = values in this interval not displayed.    Estimated Creatinine Clearance: 10 mL/min (A) (by C-G formula based on SCr of 4.97 mg/dL (H)).   Medical History: Past Medical History:  Diagnosis Date  . Cancer ([place])   . CHF (congestive heart failure) ([place])   . COPD (chronic obstructive pulmonary disease) ([place])   . Diabetes mellitus without complication ([place])   . Hypertension   . Renal disorder      Assessment: Asked to initiate Heparin for ACS/STEMI.  Baseline labs elevated, noted above.  No anticoagulants listed on PTA med list.  Pt received a dose of Heparin 5000 units SQ last pm.  Goal of Therapy:  Heparin level 0.3-0.7 units/ml Monitor platelets by anticoagulation protocol: Yes      Plan:  6/25 1037 HL 0.42 therapeutic x 1. Continue heparin at 700 units/hr. Recheck HL at 1900 to confirm. CBC daily while on heparin drip.  Tawnya Crook, PharmD 11/29/2019,1:15 PM

## 2019-11-29 NOTE — Progress Notes (Signed)
Warming Phase initiated at 2200 on 11/29/2019 Patient will slowly begin to re-warm from 36 C to 37 C over the course of the next few hours. Will continue to monitor closely.  Cameron Ali, RN

## 2019-11-29 NOTE — Progress Notes (Signed)
ANTICOAGULATION CONSULT NOTE - Initial Consult  Pharmacy Consult for Heparin Indication: ACS/STEMI  Allergies  Allergen Reactions  . Ace Inhibitors Other (See Comments)    Potassium elevated  . Gabapentin Other (See Comments)    Ataxia at 100 mg.  . Losartan Other (See Comments)    Hyperkalemia    Patient Measurements: Height: 5\' 6"  (167.6 cm) Weight: 60 kg (132 lb 4.4 oz) IBW/kg (Calculated) : 63.8 HEPARIN DW (KG): 60  Vital Signs: Temp: 96.6 F (35.9 C) (06/25 0200) Temp Source: Rectal (06/25 0200) BP: 127/73 (06/25 0200) Pulse Rate: 94 (06/25 0200)  Labs: Recent Labs    11/28/19 1331 11/28/19 1604 11/28/19 1638 11/28/19 1919 11/28/19 2043 11/28/19 2226 11/29/19 0020  HGB 9.2*  --   --   --   --   --   --   HCT 25.8*  --   --   --   --   --   --   PLT 154  --   --   --   --   --   --   APTT  --   --   --   --  42*  --   --   LABPROT 14.3  --   --   --  17.8*  --   --   INR 1.2  --   --   --  1.5*  --   --   CREATININE 4.37*  --  3.90* 4.68*  --   --   --   TROPONINIHS 45*   < >  --  242*  --  630* 943*   < > = values in this interval not displayed.    Estimated Creatinine Clearance: 10 mL/min (A) (by C-G formula based on SCr of 4.68 mg/dL (H)).   Medical History: Past Medical History:  Diagnosis Date  . Cancer ([place])   . CHF (congestive heart failure) ([place])   . COPD (chronic obstructive pulmonary disease) ([place])   . Diabetes mellitus without complication ([place])   . Hypertension   . Renal disorder     Medications:  Medications Prior to Admission  Medication Sig Dispense Refill Last Dose  . amLODipine (NORVASC) 2.5 MG tablet Take 2.5 mg by mouth daily.    11/27/2019 at Unknown time  . aspirin 81 MG chewable tablet Chew 81 mg by mouth daily.    11/27/2019 at Unknown time  . atorvastatin (LIPITOR) 80 MG tablet Take 80 mg by mouth daily.    11/27/2019 at Unknown time  . B Complex-C-Folic Acid (TRIPHROCAPS) 1 MG CAPS Take 1 capsule by mouth daily.    11/27/2019 at Unknown time  . Cholecalciferol (VITAMIN D3) 1000 units CAPS Take 1,000 Units by mouth daily.    11/27/2019 at Unknown time  . clopidogrel (PLAVIX) 75 MG tablet Take 75 mg by mouth daily.    11/27/2019 at Unknown time  . ferrous sulfate 325 (65 FE) MG EC tablet Take 325 mg by mouth daily with breakfast.   11/27/2019 at Unknown time  . GLIPIZIDE XL 10 MG 24 hr tablet Take 10 mg by mouth daily with breakfast.    11/27/2019 at Unknown time  . hydrALAZINE (APRESOLINE) 25 MG tablet Take 25 mg by mouth 3 (three) times daily.    11/27/2019 at Unknown time  . isosorbide dinitrate (ISORDIL) 20 MG tablet Take 20 mg by mouth 3 (three) times daily.   11/27/2019 at Unknown time  . metoprolol succinate (TOPROL-XL) 25 MG 24 hr tablet  Take 25 mg by mouth daily.    11/27/2019 at Unknown time  . mirtazapine (REMERON) 7.5 MG tablet Take 7.5 mg by mouth at bedtime.    11/27/2019 at Unknown time  . pantoprazole (PROTONIX) 40 MG tablet Take 40 mg by mouth daily.   11/27/2019 at Unknown time  . sevelamer carbonate (RENVELA) 800 MG tablet Take 800-1,600 mg by mouth in the morning, at noon, in the evening, and at bedtime.   11/27/2019 at Unknown time  . torsemide (DEMADEX) 20 MG tablet Take 20 mg by mouth daily.    11/27/2019 at Unknown time  . glucagon (GLUCAGON EMERGENCY) 1 MG injection as needed. Reported on 06/02/2015   unknown at prn  . glucose blood (PRECISION QID TEST) test strip Accu check Aviva strips, use twice daily     . nitroGLYCERIN (NITROSTAT) 0.3 MG SL tablet Place 0.3 mg under the tongue every 5 (five) minutes as needed for chest pain.    unknown at prn    Assessment: Asked to initiate Heparin for ACS/STEMI.  Baseline labs elevated, noted above.  No anticoagulants listed on PTA med list.  Pt received a dose of Heparin 5000 units SQ last pm.  Goal of Therapy:  Heparin level 0.3-0.7 units/ml Monitor platelets by anticoagulation protocol: Yes  APTT 66-102   Plan:  Heparin 1000 units bolus then  heparin infusion at 700 units/hr. Will check heparin level and aPTT 8 hours after heparin started to evaluate, will use aPTT to manage heparin until correlates w/ HL.    [place] Crane, Aneli Zara A 11/29/2019,2:21 AM

## 2019-11-29 NOTE — Progress Notes (Signed)
Hemodialysis patient known at [place] 11:00am, per clinic patient normally self transports or wife will sometimes transport. Please let me know if there will be any change in this current plan at discharge.   Elvera Bicker Dialysis Coordinator [phone]

## 2019-11-29 NOTE — Progress Notes (Addendum)
Patient currently resting on the ventilator. He has a #6 Shiley trach that was placed upon admission (patient has a history of laryngeal cancer, therefore a trach was placed instead of an ET tube). Patient currently responds to pain, pupils are 1 and sluggish, his gag/cough are present, and corenals are intact. He does not follow commands, but bilateral grips are present. Both eyes are edematous, and he has nasal packing in the left nare due to increased bleeding after trach insertion in the ED (EMS had previously placed a nasopharyngeal airway in the field due to difficulty bagging, which most likely caused trauma to the nare). He is receiving targeted temperature management due to cardiac arrest, and he currently in the maintenance phase (controlling at 36 C for 24 hours). The patient has three peripheral IVs and a triple lumen central line. He had an intraosseous line in his right shoulder which was removed. He also has a permanent HD cath in his left arm (he is a Tuesday, Thursday, Saturday HD patient). He is currently receiving amiodarone at 30 mg/hr, fentanyl at 200 mcg/hr, quad-strength levophed at 25 mcg/min, and sodium bicarbonate 150 mEq in dextrose 5% at 75 mL/hr. Due to a potassium of 2.7 with yesterday's labs, he also received IV replacement (10 mEq in 50 mL x3) this shift. The patient is oliguric, therefore no foley catheter is needed at this time. Patient's temperature is being obtained via rectal probe. Patient's wife, Kerry [name], plans to return to the bedside in the AM. Patient appears comfortable and in no apparent distress. Other than temperature, all vital signs remain within normal limits. Will continue to monitor.  Kerry Ali, RN

## 2019-11-29 NOTE — Consult Note (Signed)
Cardiology Consultation Note    Patient ID: Kerry [name], MRN: [id number], DOB/AGE: [birth year] 86 y.o. Admit date: 11/28/2019   Date of Consult: 11/29/2019 Primary Physician: Cecile Sheerer, MD Primary Cardiologist: Dr. Ubaldo Glassing  Chief Complaint: cardiac arrest  Reason for Consultation: cardiac arrest  Requesting MD: Dr. Lanney Gins  HPI: Kerry [name] is a 86 y.o. male with history of 86 y.o.male patient who has a history of diabetes, hypertension, laryngeal carcinoma s/p tracheostomy, end-stage kidney disease currently on hemodialysis, hyperlipidemia and sleep apnea who was admitted after suffering a cardiac arrest during hemodialysis. Was in vf per report in dialysis and had two more cardiac arrest episodes while in the er.  He currently is in the code ice protocol and is on iv amiodarone, iv levophed. He has a fairly complicated past medical history including he has undergone placement of a tracheostomy due to laryngeal carcinoma and resultant laryngectomy. He also had a CT scan which showed coronary calcification however his coronary anatomy has not been documented . He is currently minimally responsive to verbal commands but does not appear to have purposeful movement.  His laboratories prior to the arrest showed a potassium of 2.7.  Currently is back up to 4.7.  His serum creatinine was 4.371-day ago 3.9 yesterday afternoon to 4.97 this morning.  Serum troponins was 61 on presentation and this peaked at 943.  Is of note patient received CPR and defibrillation.   Past Medical History:  Diagnosis Date  . Cancer ([place])   . CHF (congestive heart failure) ([place])   . COPD (chronic obstructive pulmonary disease) ([place])   . Diabetes mellitus without complication ([place])   . Hypertension   . Renal disorder       Surgical History:  Past Surgical History:  Procedure Laterality Date  . BACK SURGERY       Home Meds: Prior to Admission medications   Medication Sig Start Date End Date  Taking? Authorizing Provider  amLODipine (NORVASC) 2.5 MG tablet Take 2.5 mg by mouth daily.  05/18/17  Yes [provider]  aspirin 81 MG chewable tablet Chew 81 mg by mouth daily.  03/15/17  Yes [provider]  atorvastatin (LIPITOR) 80 MG tablet Take 80 mg by mouth daily.  03/15/17  Yes [provider]  B Complex-C-Folic Acid (TRIPHROCAPS) 1 MG CAPS Take 1 capsule by mouth daily. 09/30/19  Yes [provider]  Cholecalciferol (VITAMIN D3) 1000 units CAPS Take 1,000 Units by mouth daily.    Yes [provider]  clopidogrel (PLAVIX) 75 MG tablet Take 75 mg by mouth daily.  05/10/17  Yes [provider]  ferrous sulfate 325 (65 FE) MG EC tablet Take 325 mg by mouth daily with breakfast.   Yes [provider]  GLIPIZIDE XL 10 MG 24 hr tablet Take 10 mg by mouth daily with breakfast.  05/05/17  Yes [provider]  hydrALAZINE (APRESOLINE) 25 MG tablet Take 25 mg by mouth 3 (three) times daily.  03/01/17  Yes [provider]  isosorbide dinitrate (ISORDIL) 20 MG tablet Take 20 mg by mouth 3 (three) times daily.   Yes [provider]  metoprolol succinate (TOPROL-XL) 25 MG 24 hr tablet Take 25 mg by mouth daily.  03/01/17  Yes [provider]  mirtazapine (REMERON) 7.5 MG tablet Take 7.5 mg by mouth at bedtime.  05/11/17  Yes [provider]  pantoprazole (PROTONIX) 40 MG tablet Take 40 mg by mouth daily. 11/21/19  Yes [provider]  sevelamer carbonate (RENVELA) 800 MG tablet Take 800-1,600 mg by mouth in the morning, at noon, in the evening, and at bedtime. 11/14/19  Yes [provider]  torsemide (DEMADEX) 20 MG tablet Take 20 mg by mouth daily.  05/15/17  Yes [provider]  glucagon (GLUCAGON EMERGENCY) 1 MG injection as needed. Reported on 06/02/2015 01/19/15   [provider]  glucose blood (PRECISION QID TEST) test strip Accu check Aviva strips, use twice  daily 01/16/15   [provider]  nitroGLYCERIN (NITROSTAT) 0.3 MG SL tablet Place 0.3 mg under the tongue every 5 (five) minutes as needed for chest pain.  03/16/17   [provider]    Inpatient Medications:  . aspirin  324 mg Oral NOW   Or  . aspirin  300 mg Rectal NOW  . chlorhexidine gluconate (MEDLINE KIT)  15 mL Mouth Rinse BID  . Chlorhexidine Gluconate Cloth  6 each Topical Daily  . fentaNYL (SUBLIMAZE) injection  50 mcg Intravenous Once  . mouth rinse  15 mL Mouth Rinse 10 times per day   . sodium chloride    . sodium chloride Stopped (11/28/19 2021)  . amiodarone 30 mg/hr (11/29/19 0400)  . famotidine (PEPCID) IV Stopped (11/28/19 2148)  . fentaNYL infusion INTRAVENOUS 300 mcg/hr (11/29/19 0400)  . heparin 700 Units/hr (11/29/19 0400)  . norepinephrine (LEVOPHED) Adult infusion 22 mcg/min (11/29/19 0400)  . propofol (DIPRIVAN) infusion Stopped (11/28/19 1610)  . sodium bicarbonate 150 mEq in dextrose 5% 1000 mL 75 mL/hr at 11/29/19 0400    Allergies:  Allergies  Allergen Reactions  . Ace Inhibitors Other (See Comments)    Potassium elevated  . Gabapentin Other (See Comments)    Ataxia at 100 mg.  . Losartan Other (See Comments)    Hyperkalemia    Social History   Socioeconomic History  . Marital status: Married    Spouse name: Not on file  . Number of children: Not on file  . Years of education: Not on file  . Highest education level: Not on file  Occupational History  . Not on file  Tobacco Use  . Smoking status: Former Smoker    Packs/day: 0.50    Years: 20.00    Pack years: 10.00    Types: Cigarettes    Quit date: 12/05/2014    Years since quitting: 4.9  . Smokeless tobacco: Never Used  . Tobacco comment: Quit 12/2014  Vaping Use  . Vaping Use: Never used  Substance and Sexual Activity  . Alcohol use: No  . Drug use: No  . Sexual activity: Not on file  Other Topics Concern  . Not on file  Social History Narrative  . Not on  file   Social Determinants of Health   Financial Resource Strain:   . Difficulty of Paying Living Expenses:   Food Insecurity:   . Worried About Charity fundraiser in the Last Year:   . Arboriculturist in the Last Year:   Transportation Needs:   . Film/video editor (Medical):   Marland Kitchen Lack of Transportation (Non-Medical):   Physical Activity:   . Days of Exercise per Week:   . Minutes of Exercise per Session:   Stress:   . Feeling of Stress :   Social Connections:   . Frequency of Communication with Friends and Family:   . Frequency of Social Gatherings with Friends and Family:   . Attends Religious Services:   . Active Member  of Clubs or Organizations:   . Attends Archivist Meetings:   Marland Kitchen Marital Status:   Intimate Partner Violence:   . Fear of Current or Ex-Partner:   . Emotionally Abused:   Marland Kitchen Physically Abused:   . Sexually Abused:      No family history on file.   Review of Systems: A 12-system review of systems was performed and is negative except as noted in the HPI.  Labs: No results for input(s): CKTOTAL, CKMB, TROPONINI in the last 72 hours. Lab Results  Component Value Date   WBC 10.1 11/28/2019   HGB 9.2 (L) 11/28/2019   HCT 25.8 (L) 11/28/2019   MCV 92.1 11/28/2019   PLT 154 11/28/2019    Recent Labs  Lab 11/28/19 1638 11/28/19 1919 11/29/19 0610  NA 146*   < > 148*  K 2.7*   < > 4.7  CL 115*   < > 103  CO2 16*   < > 25  BUN 22   < > 34*  CREATININE 3.90*   < > 4.97*  CALCIUM 7.0*   < > 8.0*  PROT 4.2*  --   --   BILITOT 1.3*  --   --   ALKPHOS 51  --   --   ALT 315*  --   --   AST 386*  --   --   GLUCOSE 116*   < > 326*   < > = values in this interval not displayed.   No results found for: CHOL, HDL, LDLCALC, TRIG No results found for: DDIMER  Radiology/Studies:  CT HEAD WO CONTRAST  Result Date: 11/28/2019 CLINICAL DATA:  Status post cardiac arrest. EXAM: CT HEAD WITHOUT CONTRAST TECHNIQUE: Contiguous axial images were  obtained from the base of the skull through the vertex without intravenous contrast. COMPARISON:  None. FINDINGS: Brain: There is mild cerebral atrophy with widening of the extra-axial spaces and ventricular dilatation. There are areas of decreased attenuation within the white matter tracts of the supratentorial brain, consistent with microvascular disease changes. Vascular: No hyperdense vessel or unexpected calcification. Skull: Normal. Negative for fracture or focal lesion. Sinuses/Orbits: No acute finding. Other: None. IMPRESSION: 1. Generalized cerebral atrophy. 2. No acute intracranial abnormality. Electronically Signed   By: Virgina Norfolk M.D.   On: 11/28/2019 18:06   DG Chest Portable 1 View  Result Date: 11/28/2019 CLINICAL DATA:  Status post cardiac arrest during dialysis. EXAM: PORTABLE CHEST 1 VIEW COMPARISON:  Chest x-ray dated 07/19/2017 FINDINGS: Endotracheal tube in good position 4.7 cm above the carina. Heart size is normal. Slight bilateral haziness, left greater than right, likely represents mild edema. No consolidative infiltrates or effusions. No bone abnormality. IMPRESSION: 1. Endotracheal tube in good position. 2. Mild pulmonary edema. Electronically Signed   By: Lorriane Shire M.D.   On: 11/28/2019 14:33    Wt Readings from Last 3 Encounters:  11/29/19 67.2 kg  01/17/18 59.4 kg  07/26/17 66.7 kg    EKG: Sinus tachycardia  Physical Exam:  African-American male breathing on ventilator via tracheostomy.  Critically ill-appearing.  Sedated. Blood pressure 134/73, pulse 96, temperature (!) 96.8 F (36 C), temperature source Rectal, resp. rate 20, height 5' 6"  (1.676 m), weight 67.2 kg, SpO2 100 %. Body mass index is 23.91 kg/m. General: Well developed, well nourished, in no acute distress. Head: Normocephalic, atraumatic, sclera non-icteric, no xanthomas, nares are without discharge.  Neck: Negative for carotid bruits. JVD not elevated. Lungs: Clear bilaterally to  auscultation without  wheezes, rales, or rhonchi. Breathing is unlabored. Heart: RRR with S1 S2. No murmurs, rubs, or gallops appreciated. Abdomen: Soft, non-tender, non-distended with normoactive bowel sounds. No hepatomegaly. No rebound/guarding. No obvious abdominal masses. Msk:  Strength and tone appear normal for age. Extremities: No clubbing or cyanosis. No edema.  Distal pedal pulses are 2+ and equal bilaterally. Neuro: Opens eyes to verbal stimuli however does not appear to have purposeful movement.     Assessment and Plan  86 year old male with history of end-stage renal disease on hemodialysis, history of laryngeal carcinoma status post tracheostomy, history of hyperlipidemia and sleep apnea who suffered a cardiac arrest during hemodialysis yesterday.  Was noted to be in VF was cardioverted out of this.  Had 2 more V. tach episodes in the ER.  As of now his potassium was 2.7 when this started.  He currently is in sinus rhythm intubated on a code ice protocol in the ICU on pressors.  Minimal response to verbal stimuli.  Serum troponin increased to 943.  Was 71 on initial evaluation.  1.  Cardiac arrest-appears to be in a primary VF arrest.  May have been exacerbated by low serum potassium at 2.7.  Patient's cardiac markers, high-sensitivity troponin are mildly elevated.  Not surprising there is elevation given ventricular arrhythmia and resuscitation from this.  Certainly coronary disease may be playing a role however not a candidate for invasive evaluation at present.  Would continue with IV heparin for at least 24 more hours.  Will evaluate ejection fraction when warmed back up from the code ice protocol.  We will continue to maintain potassium greater than 4 magnesium greater than 2.  Currently on IV amiodarone.  We will continue with this for now.  2.  End-stage renal disease-on hemodialysis.  Continue nephrology evaluation and treatment.  3.  Elevated high-sensitivity troponin-May be  secondary to the arrest.  Coronary disease is possible but given fairly blunted response, does not appear to be in a primary ischemic event.  We will continue with heparin for 24 more hours following brain function and hemodynamics.  Will consider weaning off heparin after 24 hours.  Further invasive/noninvasive evaluation pending course.  As mentioned above, would evaluate LV function after he has been warmed up from the code ice protocol.  4.  Laryngeal carcinoma-stable.  Has tracheostomy    Signed, Teodoro Spray MD 11/29/2019, 7:08 AM Pager: [phone]

## 2019-11-29 NOTE — Progress Notes (Signed)
Initial Nutrition Assessment  DOCUMENTATION CODES:   Not applicable  INTERVENTION:   Once tube feeds initiated, recommend:  Vital HP @55ml /hr  Free water flushes 29ml q4 hours to maintain tube patency   Regimen provides 1320kcal/day, 115g/day protein, 1239ml/day free water   B-complex with C daily via tube   NUTRITION DIAGNOSIS:   Inadequate oral intake related to inability to eat (Pt sedated and ventilated) as evidenced by NPO status.  GOAL:   Provide needs based on ASPEN/SCCM guidelines  MONITOR:   Vent status, Labs, Weight trends, Skin, I & O's  REASON FOR ASSESSMENT:   Ventilator    ASSESSMENT:   86 year old male with history of end-stage renal disease on hemodialysis, history of laryngeal carcinoma status post tracheostomy, hyperlipidemia, sleep apnea, CHF, COPD, HTN, DM who is admitted with cardiac arrest during hemodialysis   RD working remotely.  Pt sedated and ventilated. No OGT in place. Pt currently undergoing cooling protocol. No plans for tube feeds today. Per chart, pt appears weight stable pta.   Medications reviewed and include: aspirin, insulin, fentanyl, pepcid, heparin, levophed, propofol, Na bicarbonate   Labs reviewed: Na 148(H), BUN 34(H), creat 4.97(H), P 5.4(H), Mg 2.2 wnl Wbc- 20.0(H), Hgb 7.4(L), Hct 20.7(L) cbgs- 142, 317, 324 x 24hrs  Patient is currently intubated on ventilator support MV: 8.3 L/min Temp (24hrs), Avg:96.6 F (35.9 C), Min:95 F (35 C), Max:97.2 F (36.2 C)  Propofol: 9 ml/hr- provides 237kcal/day   MAP- >20mmHg  NUTRITION - FOCUSED PHYSICAL EXAM: Unable to perform at this time   Diet Order:   Diet Order    None     EDUCATION NEEDS:   No education needs have been identified at this time  Skin:  Skin Assessment: Reviewed RN Assessment  Last BM:  6/24  Height:   Ht Readings from Last 1 Encounters:  11/28/19 5\' 6"  (1.676 m)    Weight:   Wt Readings from Last 1 Encounters:  11/29/19 67.2 kg     Ideal Body Weight:  64.5 kg  BMI:  Body mass index is 23.91 kg/m.  Estimated Nutritional Needs:   Kcal:  1349kcal/day  Protein:  100-114g/day  Fluid:  UOP +1L  Koleen Distance MS, RD, LDN Please refer to [facility] for RD and/or RD on-call/weekend/after hours pager

## 2019-11-29 NOTE — Consult Note (Signed)
[facility] CONSULT NOTE    Date: 11/29/2019                  Patient Name:  Kerry [name]  MRN: [id number]  DOB: 1933/09/22  Age / Sex: 86 y.o., male         PCP: Cecile Sheerer, MD                 Service Requesting Consult: Critical Care                 Reason for Consult:  Evaluation management of ESRD.            History of Present Illness: Patient is a 85 y.o. 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  Patient apparently lost pulse while having dialysis yesterday.  In the field he developed atrial fibrillation with RVR and required bag mask ventilation.  When he was brought to the emergency department he coded x2 with V. fib arrest status post ACLS.  Patient currently on the ventilator and on hypothermia protocol.  Patient's wife at the bedside.   Medications: Outpatient medications: Medications Prior to Admission  Medication Sig Dispense Refill Last Dose  . amLODipine (NORVASC) 2.5 MG tablet Take 2.5 mg by mouth daily.    11/27/2019 at Unknown time  . aspirin 81 MG chewable tablet Chew 81 mg by mouth daily.    11/27/2019 at Unknown time  . atorvastatin (LIPITOR) 80 MG tablet Take 80 mg by mouth daily.    11/27/2019 at Unknown time  . B Complex-C-Folic Acid (TRIPHROCAPS) 1 MG CAPS Take 1 capsule by mouth daily.   11/27/2019 at Unknown time  . Cholecalciferol (VITAMIN D3) 1000 units CAPS Take 1,000 Units by mouth daily.    11/27/2019 at Unknown time  . clopidogrel (PLAVIX) 75 MG tablet Take 75 mg by mouth daily.    11/27/2019 at Unknown time  . ferrous sulfate 325 (65 FE) MG EC tablet Take 325 mg by mouth daily with breakfast.   11/27/2019 at Unknown time  . GLIPIZIDE XL 10 MG  24 hr tablet Take 10 mg by mouth daily with breakfast.    11/27/2019 at Unknown time  . hydrALAZINE (APRESOLINE) 25 MG tablet Take 25 mg by mouth 3 (three) times daily.    11/27/2019 at Unknown time  . isosorbide dinitrate (ISORDIL) 20 MG tablet Take 20 mg by mouth 3 (three) times daily.   11/27/2019 at Unknown time  . metoprolol succinate (TOPROL-XL) 25 MG 24 hr tablet Take 25 mg by mouth daily.    11/27/2019 at Unknown time  . mirtazapine (REMERON) 7.5 MG tablet Take 7.5 mg by mouth at bedtime.    11/27/2019 at Unknown time  . pantoprazole (PROTONIX) 40 MG tablet Take 40 mg by mouth daily.   11/27/2019 at Unknown time  . sevelamer carbonate (RENVELA) 800 MG tablet Take 800-1,600 mg by mouth in the morning, at noon, in the evening, and at bedtime.   11/27/2019 at Unknown time  . torsemide (DEMADEX) 20 MG tablet Take 20 mg by mouth daily.    11/27/2019 at Unknown time  . glucagon (GLUCAGON EMERGENCY) 1 MG injection as needed. Reported on 06/02/2015   unknown at prn  . glucose blood (  PRECISION QID TEST) test strip Accu check Aviva strips, use twice daily     . nitroGLYCERIN (NITROSTAT) 0.3 MG SL tablet Place 0.3 mg under the tongue every 5 (five) minutes as needed for chest pain.    unknown at prn    Current medications: Current Facility-Administered Medications  Medication Dose Route Frequency Provider Last Rate Last Admin  . 0.9 %  sodium chloride infusion  250 mL Intravenous Continuous Aleskerov, Fuad, MD      . 0.9 %  sodium chloride infusion   Intravenous Continuous Awilda Bill, NP   Held at 11/28/19 2021  . amiodarone (NEXTERONE PREMIX) 360-4.14 MG/200ML-% (1.8 mg/mL) IV infusion  30 mg/hr Intravenous Continuous Nance Pear, MD 16.67 mL/hr at 11/29/19 1213 30 mg/hr at 11/29/19 1213  . aspirin chewable tablet 324 mg  324 mg Oral NOW Ottie Glazier, MD       Or  . aspirin suppository 300 mg  300 mg Rectal NOW Ottie Glazier, MD      . chlorhexidine gluconate (MEDLINE KIT) (PERIDEX) 0.12  % solution 15 mL  15 mL Mouth Rinse BID Awilda Bill, NP   15 mL at 11/29/19 0830  . Chlorhexidine Gluconate Cloth 2 % PADS 6 each  6 each Topical Daily Awilda Bill, NP      . Derrill Memo ON 11/30/2019] famotidine (PEPCID) IVPB 20 mg premix  20 mg Intravenous Q24H Flora Lipps, MD      . fentaNYL (SUBLIMAZE) bolus via infusion 25 mcg  25 mcg Intravenous Q30 min PRN Awilda Bill, NP      . fentaNYL (SUBLIMAZE) injection 50 mcg  50 mcg Intravenous Once Awilda Bill, NP      . fentaNYL 2524mg in NS 25110m(1016mml) infusion-PREMIX  0-400 mcg/hr Intravenous Continuous SiaArta SilenceD 30 mL/hr at 11/29/19 0933 300 mcg/hr at 11/29/19 0933  . heparin ADULT infusion 100 units/mL (25000 units/250m51mdium chloride 0.45%)  700 Units/hr Intravenous Continuous Hall, Scott A, RPH 7 mL/hr at 11/29/19 0400 700 Units/hr at 11/29/19 0400  . insulin aspart (novoLOG) injection 0-6 Units  0-6 Units Subcutaneous Q4H KasaFlora Lipps   4 Units at 11/29/19 1113  . MEDLINE mouth rinse  15 mL Mouth Rinse 10 times per day BlakAwilda Bill   15 mL at 11/29/19 1110  . norepinephrine (LEVOPHED) 16 mg in 250mL75mmix infusion  0-50 mcg/min Intravenous Titrated BlakeAwilda Bill16.88 mL/hr at 11/29/19 0757 18 mcg/min at 11/29/19 0757  . polyethylene glycol (MIRALAX / GLYCOLAX) packet 17 g  17 g Per Tube Daily PRN BlakeAwilda Bill     . propofol (DIPRIVAN) 1000 MG/100ML infusion  25-80 mcg/kg/min Intravenous Continuous BlakeAwilda Bill9 mL/hr at 11/29/19 0730 25 mcg/kg/min at 11/29/19 0730  . sodium bicarbonate 150 mEq in dextrose 5% 1000 mL infusion  150 mEq Intravenous Continuous BlakeAwilda Bill75 mL/hr at 11/29/19 0400 Rate Verify at 11/29/19 0400      Allergies: Allergies  Allergen Reactions  . Ace Inhibitors Other (See Comments)    Potassium elevated  . Gabapentin Other (See Comments)    Ataxia at 100 mg.  . Losartan Other (See Comments)    Hyperkalemia      Past Medical  History: Past Medical History:  Diagnosis Date  . Cancer (HCC) [place] CHF (congestive heart failure) (HCC) [place] COPD (chronic obstructive pulmonary disease) (HCC) [place] Diabetes mellitus without complication (HCC) [place]  Hypertension   . Renal disorder      Past Surgical History: Past Surgical History:  Procedure Laterality Date  . BACK SURGERY       Family History: No family history on file.   Social History: Social History   Socioeconomic History  . Marital status: Married    Spouse name: Not on file  . Number of children: Not on file  . Years of education: Not on file  . Highest education level: Not on file  Occupational History  . Not on file  Tobacco Use  . Smoking status: Former Smoker    Packs/day: 0.50    Years: 20.00    Pack years: 10.00    Types: Cigarettes    Quit date: 12/05/2014    Years since quitting: 4.9  . Smokeless tobacco: Never Used  . Tobacco comment: Quit 12/2014  Vaping Use  . Vaping Use: Never used  Substance and Sexual Activity  . Alcohol use: No  . Drug use: No  . Sexual activity: Not on file  Other Topics Concern  . Not on file  Social History Narrative  . Not on file   Social Determinants of Health   Financial Resource Strain:   . Difficulty of Paying Living Expenses:   Food Insecurity:   . Worried About Charity fundraiser in the Last Year:   . Arboriculturist in the Last Year:   Transportation Needs:   . Film/video editor (Medical):   Marland Kitchen Lack of Transportation (Non-Medical):   Physical Activity:   . Days of Exercise per Week:   . Minutes of Exercise per Session:   Stress:   . Feeling of Stress :   Social Connections:   . Frequency of Communication with Friends and Family:   . Frequency of Social Gatherings with Friends and Family:   . Attends Religious Services:   . Active Member of Clubs or Organizations:   . Attends Archivist Meetings:   Marland Kitchen Marital Status:   Intimate Partner Violence:   . Fear of Current  or Ex-Partner:   . Emotionally Abused:   Marland Kitchen Physically Abused:   . Sexually Abused:      Review of Systems: Patient unable to provide as he is currently on the ventilator and on hypothermia protocol  Vital Signs: Blood pressure (!) 115/59, pulse 76, temperature (!) 96.6 F (35.9 C), resp. rate 20, height 5' 6"  (1.676 m), weight 67.2 kg, SpO2 100 %.  Weight trends: Filed Weights   11/28/19 2000 11/29/19 0330  Weight: 60 kg 67.2 kg    Physical Exam: General: Critically ill-appearing  Head: Normocephalic, atraumatic.  Eyes: Anicteric, EOMI  Nose: Mucous membranes moist, not inflammed, nonerythematous.  Throat: Unable to visualize  Neck: Tracheostomy in place  Lungs:  Normal respiratory effort. Clear to auscultation BL without crackles or wheezes.  Heart: S1S2 no pericardial friction rub  Abdomen:  BS normoactive. Soft, Nondistended, non-tender.  No masses or organomegaly.  Extremities: No pretibial edema.  Neurologic: Currently on the ventilator, not following commands, on hypothermia protocol  Skin: No visible rashes, scars.    Lab results: Basic Metabolic Panel: Recent Labs  Lab 11/28/19 1638 11/28/19 1919 11/29/19 0610  NA 146* 145 148*  K 2.7* 3.8 4.7  CL 115* 105 103  CO2 16* 19* 25  GLUCOSE 116* 184* 326*  BUN 22 28* 34*  CREATININE 3.90* 4.68* 4.97*  CALCIUM 7.0* 8.2* 8.0*  MG  --  2.4 2.2  PHOS  --  4.7* 5.4*    Liver Function Tests: Recent Labs  Lab 11/28/19 1331 11/28/19 1638  AST 343* 386*  ALT 300* 315*  ALKPHOS 65 51  BILITOT 1.5* 1.3*  PROT 5.8* 4.2*  ALBUMIN 3.3* 2.3*   No results for input(s): LIPASE, AMYLASE in the last 168 hours. No results for input(s): AMMONIA in the last 168 hours.  CBC: Recent Labs  Lab 11/28/19 1331 11/29/19 0610  WBC 10.1 20.0*  NEUTROABS 6.9 18.6*  HGB 9.2* 7.4*  HCT 25.8* 20.7*  MCV 92.1 90.4  PLT 154 139*    Cardiac Enzymes: No results for input(s): CKTOTAL, CKMB, CKMBINDEX, TROPONINI in the  last 168 hours.  BNP: Invalid input(s): POCBNP  CBG: Recent Labs  Lab 11/28/19 2328 11/28/19 2331 11/29/19 0324 11/29/19 0836 11/29/19 1109  GLUCAP 238* 137* 142* 317* 324*    Microbiology: Results for orders placed or performed during the hospital encounter of 11/28/19  Culture, blood (routine x 2)     Status: None (Preliminary result)   Collection Time: 11/28/19  1:31 PM   Specimen: BLOOD  Result Value Ref Range Status   Specimen Description BLOOD BLOOD RIGHT ARM  Final   Special Requests   Final    BOTTLES DRAWN AEROBIC AND ANAEROBIC Blood Culture adequate volume   Culture   Final    NO GROWTH < 24 HOURS Performed at [facility], [address] [place], [place] [address]    Report Status PENDING  Incomplete  Culture, blood (routine x 2)     Status: None (Preliminary result)   Collection Time: 11/28/19  2:17 PM   Specimen: BLOOD  Result Value Ref Range Status   Specimen Description BLOOD BLOOD RIGHT WRIST  Final   Special Requests   Final    BOTTLES DRAWN AEROBIC AND ANAEROBIC Blood Culture adequate volume   Culture   Final    NO GROWTH < 24 HOURS Performed at [facility], [address], [place], [place] [address]    Report Status PENDING  Incomplete  SARS Coronavirus 2 by RT PCR (hospital order, performed in [place] hospital lab) Nasopharyngeal Nasopharyngeal Swab     Status: None   Collection Time: 11/28/19  4:40 PM   Specimen: Nasopharyngeal Swab  Result Value Ref Range Status   SARS Coronavirus 2 NEGATIVE NEGATIVE Final    Comment: (NOTE) SARS-CoV-2 target nucleic acids are NOT DETECTED.  The SARS-CoV-2 RNA is generally detectable in upper and lower respiratory specimens during the acute phase of infection. The lowest concentration of SARS-CoV-2 viral copies this assay can detect is 250 copies / mL. A negative result does not preclude SARS-CoV-2 infection and should not be used as the sole basis for treatment or other patient  management decisions.  A negative result may occur with improper specimen collection / handling, submission of specimen other than nasopharyngeal swab, presence of viral mutation(s) within the areas targeted by this assay, and inadequate number of viral copies (<250 copies / mL). A negative result must be combined with clinical observations, patient history, and epidemiological information.  Fact Sheet for Patients:   StrictlyIdeas.no  Fact Sheet for Healthcare Providers: BankingDealers.co.za  This test is not yet approved or  cleared by the [place] FDA and has been authorized for detection and/or diagnosis of SARS-CoV-2 by FDA under an Emergency Use Authorization (EUA).  This EUA will remain in effect (meaning this test can be used) for the duration of the COVID-19 declaration under  Section 564(b)(1) of the Act, 21 U.S.C. section 360bbb-3(b)(1), unless the authorization is terminated or revoked sooner.  Performed at [facility], [address] [place], [place] [address]   MRSA PCR Screening     Status: None   Collection Time: 11/28/19  9:08 PM   Specimen: Nasopharyngeal  Result Value Ref Range Status   MRSA by PCR NEGATIVE NEGATIVE Final    Comment:        The GeneXpert MRSA Assay (FDA approved for NASAL specimens only), is one component of a comprehensive MRSA colonization surveillance program. It is not intended to diagnose MRSA infection nor to guide or monitor treatment for MRSA infections. Performed at [facility], [address] [place], [place] [address]     Coagulation Studies: Recent Labs    11/28/19 1331 11/28/19 2043  LABPROT 14.3 17.8*  INR 1.2 1.5*    Urinalysis: No results for input(s): COLORURINE, LABSPEC, PHURINE, GLUCOSEU, HGBUR, BILIRUBINUR, KETONESUR, PROTEINUR, UROBILINOGEN, NITRITE, LEUKOCYTESUR in the last 72 hours.  Invalid input(s): APPERANCEUR    Imaging: CT  HEAD WO CONTRAST  Result Date: 11/28/2019 CLINICAL DATA:  Status post cardiac arrest. EXAM: CT HEAD WITHOUT CONTRAST TECHNIQUE: Contiguous axial images were obtained from the base of the skull through the vertex without intravenous contrast. COMPARISON:  None. FINDINGS: Brain: There is mild cerebral atrophy with widening of the extra-axial spaces and ventricular dilatation. There are areas of decreased attenuation within the white matter tracts of the supratentorial brain, consistent with microvascular disease changes. Vascular: No hyperdense vessel or unexpected calcification. Skull: Normal. Negative for fracture or focal lesion. Sinuses/Orbits: No acute finding. Other: None. IMPRESSION: 1. Generalized cerebral atrophy. 2. No acute intracranial abnormality. Electronically Signed   By: Virgina Norfolk M.D.   On: 11/28/2019 18:06   DG Chest Portable 1 View  Result Date: 11/28/2019 CLINICAL DATA:  Status post cardiac arrest during dialysis. EXAM: PORTABLE CHEST 1 VIEW COMPARISON:  Chest x-ray dated 07/19/2017 FINDINGS: Endotracheal tube in good position 4.7 cm above the carina. Heart size is normal. Slight bilateral haziness, left greater than right, likely represents mild edema. No consolidative infiltrates or effusions. No bone abnormality. IMPRESSION: 1. Endotracheal tube in good position. 2. Mild pulmonary edema. Electronically Signed   By: Lorriane Shire M.D.   On: 11/28/2019 14:33      Assessment & Plan: Pt is a 86 y.o. 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   [facility]/TTS  1.  ESRD on HD TTS.  Patient currently on hypothermia protocol.  Therefore we will avoid hemodialysis at this time.  Reassess the  patient for hemodialysis tomorrow based upon stability.  2.  Status post cardiac arrest/acute respiratory failure.  Currently on hypothermia protocol.  He is to come off of this later today.  Further evaluation to be performed then.  3.  Anemia of chronic kidney disease.  Hemoglobin currently 7.4.  Consider transfusion for hemoglobin of 7 or less.  4.  Secondary hyperparathyroidism.  Phosphorus 5.4 and within acceptable range at the moment.  Continue to monitor bone mineral metabolism parameters.

## 2019-11-29 NOTE — Progress Notes (Addendum)
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    Apparently patient lost pulse while having his the most recent dialysis session.  Patient had also developed A. fib RVR and was apneic in the field and required bag mask ventilation.  After arrival to the ER patient again coded x2 with V. fib arrest status post ACLS and ROSC, found to be amiodarone and 20 MCG Levophed.  wife at bedside.  Lines / Drains: Left upper extremity HD access, I/O positive  Cultures / Sepsis markers: Tracheal aspirate  Antibiotics: Vancomycin and cefepime   Protocols / Consultants: PCCM  Tests / Events: TTE CTH    CC  follow up respiratory failure  HPI Patient remains critically ill Prognosis is guarded S/p trach On hypothermia protocol   BMP Latest Ref Rng & Units 11/29/2019 11/28/2019 11/28/2019  Glucose 70 - 99 mg/dL 326(H) 184(H) 116(H)  BUN 8 - 23 mg/dL 34(H) 28(H) 22  Creatinine 0.61 - 1.24 mg/dL 4.97(H) 4.68(H) 3.90(H)  Sodium 135 - 145 mmol/L 148(H) 145 146(H)  Potassium 3.5 - 5.1 mmol/L 4.7 3.8 2.7(LL)  Chloride 98 - 111 mmol/L 103 105 115(H)  CO2 22 - 32 mmol/L 25 19(L) 16(L)  Calcium 8.9 - 10.3 mg/dL 8.0(L) 8.2(L) 7.0(L)   CBC    Component Value Date/Time   WBC 20.0 (H) 11/29/2019 0610   RBC 2.29 (L) 11/29/2019 0610   HGB 7.4 (L) 11/29/2019 0610   HCT 20.7 (L) 11/29/2019 0610   PLT 139 (L) 11/29/2019 0610   MCV 90.4 11/29/2019 0610   MCH 32.3 11/29/2019 0610   MCHC 35.7 11/29/2019 0610   RDW 16.0 (H) 11/29/2019 0610   LYMPHSABS  0.6 (L) 11/29/2019 0610   MONOABS 0.6 11/29/2019 0610   EOSABS 0.0 11/29/2019 0610   BASOSABS 0.0 11/29/2019 0610      BP 126/64   Pulse 85   Temp (!) 96.8 F (36 C) (Rectal)   Resp 20   Ht _0  (1.676 m)   Wt 67.2 kg   SpO2 99%   BMI 23.91 kg/m    I/O last 3 completed shifts: In: 2009.9 [I.V.:1493.7; IV Piggyback:516.2] Out: 0  No intake/output data recorded.  SpO2: 99 % FiO2 (%): 40 %  Estimated body mass index is 23.91 kg/m as calculated from the following:   Height as of this encounter: _1  (1.676 m).   Weight as of this encounter: 67.2 kg.  SIGNIFICANT EVENTS   REVIEW OF SYSTEMS  PATIENT IS UNABLE TO PROVIDE COMPLETE REVIEW OF SYSTEMS DUE TO SEVERE CRITICAL ILLNESS        PHYSICAL EXAMINATION:  GENERAL:critically ill appearing, +resp distress HEAD: Normocephalic, atraumatic.  EYES: Pupils equal, round, reactive to light.  No scleral icterus.  MOUTH: Moist mucosal membrane. NECK: Supple.  PULMONARY: +rhonchi, +wheezing CARDIOVASCULAR: S1 and S2. Regular rate and rhythm. No murmurs, rubs, or gallops.  GASTROINTESTINAL: Soft, nontender, -distended.  Positive bowel sounds.   MUSCULOSKELETAL: No swelling, clubbing, or edema.  NEUROLOGIC: obtunded, GCS<8 SKIN:intact,warm,dry  MEDICATIONS: I have reviewed all medications and confirmed regimen as documented   CULTURE RESULTS   Recent Results (from  the past 240 hour(s))  SARS Coronavirus 2 by RT PCR (hospital order, performed in [facility] hospital lab) Nasopharyngeal Nasopharyngeal Swab     Status: None   Collection Time: 11/28/19  4:40 PM   Specimen: Nasopharyngeal Swab  Result Value Ref Range Status   SARS Coronavirus 2 NEGATIVE NEGATIVE Final    Comment: (NOTE) SARS-CoV-2 target nucleic acids are NOT DETECTED.  The SARS-CoV-2 RNA is generally detectable in upper and lower respiratory specimens during the acute phase of infection. The lowest concentration of SARS-CoV-2 viral copies this  assay can detect is 250 copies / mL. A negative result does not preclude SARS-CoV-2 infection and should not be used as the sole basis for treatment or other patient management decisions.  A negative result may occur with improper specimen collection / handling, submission of specimen other than nasopharyngeal swab, presence of viral mutation(s) within the areas targeted by this assay, and inadequate number of viral copies (<250 copies / mL). A negative result must be combined with clinical observations, patient history, and epidemiological information.  Fact Sheet for Patients:   StrictlyIdeas.no  Fact Sheet for Healthcare Providers: BankingDealers.co.za  This test is not yet approved or  cleared by the [place] FDA and has been authorized for detection and/or diagnosis of SARS-CoV-2 by FDA under an Emergency Use Authorization (EUA).  This EUA will remain in effect (meaning this test can be used) for the duration of the COVID-19 declaration under Section 564(b)(1) of the Act, 21 U.S.C. section 360bbb-3(b)(1), unless the authorization is terminated or revoked sooner.  Performed at [facility], [address] [place], [place] [address]   MRSA PCR Screening     Status: None   Collection Time: 11/28/19  9:08 PM   Specimen: Nasopharyngeal  Result Value Ref Range Status   MRSA by PCR NEGATIVE NEGATIVE Final    Comment:        The GeneXpert MRSA Assay (FDA approved for NASAL specimens only), is one component of a comprehensive MRSA colonization surveillance program. It is not intended to diagnose MRSA infection nor to guide or monitor treatment for MRSA infections. Performed at [facility], [address] [place], [place] [address]           IMAGING    CT HEAD WO CONTRAST  Result Date: 11/28/2019 CLINICAL DATA:  Status post cardiac arrest. EXAM: CT HEAD WITHOUT CONTRAST TECHNIQUE: Contiguous  axial images were obtained from the base of the skull through the vertex without intravenous contrast. COMPARISON:  None. FINDINGS: Brain: There is mild cerebral atrophy with widening of the extra-axial spaces and ventricular dilatation. There are areas of decreased attenuation within the white matter tracts of the supratentorial brain, consistent with microvascular disease changes. Vascular: No hyperdense vessel or unexpected calcification. Skull: Normal. Negative for fracture or focal lesion. Sinuses/Orbits: No acute finding. Other: None. IMPRESSION: 1. Generalized cerebral atrophy. 2. No acute intracranial abnormality. Electronically Signed   By: Virgina Norfolk M.D.   On: 11/28/2019 18:06   DG Chest Portable 1 View  Result Date: 11/28/2019 CLINICAL DATA:  Status post cardiac arrest during dialysis. EXAM: PORTABLE CHEST 1 VIEW COMPARISON:  Chest x-ray dated 07/19/2017 FINDINGS: Endotracheal tube in good position 4.7 cm above the carina. Heart size is normal. Slight bilateral haziness, left greater than right, likely represents mild edema. No consolidative infiltrates or effusions. No bone abnormality. IMPRESSION: 1. Endotracheal tube in good position. 2. Mild pulmonary edema. Electronically Signed   By: Lorriane Shire M.D.  On: 11/28/2019 14:33     Nutrition Status:           Indwelling Urinary Catheter continued, requirement due to   Reason to continue Indwelling Urinary Catheter strict Intake/Output monitoring for hemodynamic instability   Central Line/ continued, requirement due to  Reason to continue [place] of central venous pressure or other hemodynamic parameters and poor IV access   Ventilator continued, requirement due to severe respiratory failure   Ventilator Sedation RASS 0 to -2      ASSESSMENT AND PLAN SYNOPSIS   Severe ACUTE Hypoxic and Hypercapnic Respiratory Failure due to acute NSTEMI and acute cardiac arrest with multiorgan failure  -continue  Full MV support -continue Bronchodilator Therapy -Wean Fio2 and PEEP as tolerated -will perform SAT/SBT when respiratory parameters are met -VAP/VENT bundle implementation  CARDIAC ARREST ACUTE SYSTOLIC CARDIAC FAILURE- ECHO pending -oxygen as needed -Lasix as tolerated -follow up cardiac enzymes as indicated -follow up cardiology recs On hypothermia protocol   END STAGE KIDNEY INJURY/Renal Failure -continue Foley Catheter-assess need -Avoid nephrotoxic agents -Follow urine output, BMP -Ensure adequate renal perfusion, optimize oxygenation -Renal dose medications HD on hold    NEUROLOGY Acute toxic metabolic encephalopathy, need for sedation Goal RASS -2 to -3  SHOCK-SEPSIS//CARDIOGENIC -use vasopressors to keep MAP>65 -follow ABG and LA -follow up cultures -emperic ABX -consider stress dose steroids -aggressive IV fluid resuscitation  CARDIAC ICU monitoring  ID -continue IV abx as prescibed -follow up cultures  GI GI PROPHYLAXIS as indicated  NUTRITIONAL STATUS Nutrition Status:         DIET-->on hold Constipation protocol as indicated  ENDO - will use ICU hypoglycemic\Hyperglycemia protocol if indicated     ELECTROLYTES -follow labs as needed -replace as needed -pharmacy consultation and following   DVT/GI PRX ordered and assessed TRANSFUSIONS AS NEEDED MONITOR FSBS I Assessed the need for Labs I Assessed the need for Foley I Assessed the need for Central Venous Line Family Discussion when available I Assessed the need for Mobilization I made an Assessment of medications to be adjusted accordingly Safety Risk assessment completed   CASE DISCUSSED IN MULTIDISCIPLINARY ROUNDS WITH ICU TEAM  Critical Care Time devoted to patient care services described in this note is 34 minutes.   Overall, patient is critically ill, prognosis is guarded.  Patient with Multiorgan failure and at high risk for cardiac arrest and death.   RECOMMEND DNR  STATUS  Corrin Parker, M.D.  [facility]  Medical Director [place] Director [facility] Cardio-Pulmonary Department

## 2019-11-29 NOTE — Procedures (Signed)
ELECTROENCEPHALOGRAM REPORT   Patient: Kerry [name]       Room #: IC17A-AA EEG No. ID: [id number] Age: 86 y.o.        Sex: male Requesting Physician: Kasa Report Date:  11/29/2019        Interpreting Physician: Alexis Goodell  History: Abdulahi Schor is an 86 y.o. male s/p arrest  Medications:  Propofol, Fentanyl, Levophed, Insulin  Conditions of Recording:  This is a 21 channel routine scalp EEG performed with bipolar and monopolar montages arranged in accordance to the international 10/20 system of electrode placement. One channel was dedicated to EKG recording.  The patient is in the intubated and sedated state.  Description:  The background activity is slow and poorly organized.  It consists of a low voltage polymorphic delta activity that is continuous and diffusely distributed.   No epileptiform activity is noted.   There is no activation of the background rhythm noted with noxious stimuli.  Marland Kitchen   Hyperventilation and intermittent photic stimulation were not performed.   IMPRESSION: This is an abnormal EEG secondary to general background slowing.  This finding may be seen with a diffuse cerebral disturbance that is etiologically nonspecific, but may include a medication effect, among other possibilities.  No epileptiform activity was noted.  Background lacked reactivity.     Alexis Goodell, MD Neurology [phone] 11/29/2019, 6:58 PM

## 2019-11-30 ENCOUNTER — Inpatient Hospital Stay: Payer: Medicare Other

## 2019-11-30 ENCOUNTER — Inpatient Hospital Stay
Admit: 2019-11-30 | Discharge: 2019-11-30 | Disposition: A | Discharge status: Home / Self Care | Payer: Medicare Other | Attending: Critical Care Medicine | Admitting: Critical Care Medicine

## 2019-11-30 DIAGNOSIS — I469 Cardiac arrest, cause unspecified: Secondary | ICD-10-CM

## 2019-11-30 LAB — BASIC METABOLIC PANEL
Anion gap: 11 (ref 5–15)
BUN: 12 mg/dL (ref 8–23)
CO2: 31 mmol/L (ref 22–32)
Calcium: 7.5 mg/dL — ABNORMAL LOW (ref 8.9–10.3)
Chloride: 95 mmol/L — ABNORMAL LOW (ref 98–111)
Creatinine, Ser: 1.98 mg/dL — ABNORMAL HIGH (ref 0.61–1.24)
GFR calc Af Amer: 35 mL/min — ABNORMAL LOW (ref >60)
GFR calc non Af Amer: 30 mL/min — ABNORMAL LOW (ref >60)
Glucose, Bld: 129 mg/dL — ABNORMAL HIGH (ref 70–99)
Potassium: 3.3 mmol/L — ABNORMAL LOW (ref 3.5–5.1)
Sodium: 137 mmol/L (ref 135–145)

## 2019-11-30 LAB — ECHOCARDIOGRAM COMPLETE
Height: 66 in
Weight: 2511.48 oz

## 2019-11-30 LAB — CBC
HCT: 20.6 % — ABNORMAL LOW (ref 39.0–52.0)
Hemoglobin: 7.3 g/dL — ABNORMAL LOW (ref 13.0–17.0)
MCH: 32.2 pg (ref 26.0–34.0)
MCHC: 35.4 g/dL (ref 30.0–36.0)
MCV: 90.7 fL (ref 80.0–100.0)
Platelets: 123 10*3/uL — ABNORMAL LOW (ref 150–400)
RBC: 2.27 MIL/uL — ABNORMAL LOW (ref 4.22–5.81)
RDW: 15.9 % — ABNORMAL HIGH (ref 11.5–15.5)
WBC: 10 10*3/uL (ref 4.0–10.5)
nRBC: 0.3 % — ABNORMAL HIGH (ref 0.0–0.2)

## 2019-11-30 LAB — PHOSPHORUS: Phosphorus: 3.3 mg/dL (ref 2.5–4.6)

## 2019-11-30 LAB — RENAL FUNCTION PANEL
Albumin: 2.8 g/dL — ABNORMAL LOW (ref 3.5–5.0)
Anion gap: 12 (ref 5–15)
BUN: 39 mg/dL — ABNORMAL HIGH (ref 8–23)
CO2: 33 mmol/L — ABNORMAL HIGH (ref 22–32)
Calcium: 7.4 mg/dL — ABNORMAL LOW (ref 8.9–10.3)
Chloride: 98 mmol/L (ref 98–111)
Creatinine, Ser: 5.89 mg/dL — ABNORMAL HIGH (ref 0.61–1.24)
GFR calc Af Amer: 9 mL/min — ABNORMAL LOW (ref >60)
GFR calc non Af Amer: 8 mL/min — ABNORMAL LOW (ref >60)
Glucose, Bld: 168 mg/dL — ABNORMAL HIGH (ref 70–99)
Phosphorus: 3.2 mg/dL (ref 2.5–4.6)
Potassium: 3.4 mmol/L — ABNORMAL LOW (ref 3.5–5.1)
Sodium: 143 mmol/L (ref 135–145)

## 2019-11-30 LAB — GLUCOSE, CAPILLARY
Glucose-Capillary: 194 mg/dL — ABNORMAL HIGH (ref 70–99)
Glucose-Capillary: 180 mg/dL — ABNORMAL HIGH (ref 70–99)
Glucose-Capillary: 150 mg/dL — ABNORMAL HIGH (ref 70–99)
Glucose-Capillary: 207 mg/dL — ABNORMAL HIGH (ref 70–99)
Glucose-Capillary: 145 mg/dL — ABNORMAL HIGH (ref 70–99)
Glucose-Capillary: 162 mg/dL — ABNORMAL HIGH (ref 70–99)
Glucose-Capillary: 183 mg/dL — ABNORMAL HIGH (ref 70–99)

## 2019-11-30 LAB — MAGNESIUM: Magnesium: 1.8 mg/dL (ref 1.7–2.4)

## 2019-11-30 LAB — HEPATITIS B SURFACE ANTIGEN: Hepatitis B Surface Ag: NONREACTIVE

## 2019-11-30 MED ORDER — SODIUM CHLORIDE 0.9 % IV SOLN: 100.0000 mL | INTRAVENOUS | Status: DC | PRN

## 2019-11-30 MED ORDER — DEXMEDETOMIDINE HCL IN NACL 400 MCG/100ML IV SOLN
0.4000 ug/kg/h | INTRAVENOUS | Status: DC
  Administered 2019-11-30: 0.6 ug/kg/h via INTRAVENOUS
  Filled 2019-11-30: qty 100

## 2019-11-30 MED ORDER — POTASSIUM CHLORIDE 10 MEQ/50ML IV SOLN
10.0000 meq | INTRAVENOUS | Status: AC
  Administered 2019-11-30 (×5): 10 meq via INTRAVENOUS
  Filled 2019-11-30 (×5): qty 50

## 2019-11-30 MED ORDER — HEPARIN SODIUM (PORCINE) 1000 UNIT/ML DIALYSIS
1000.0000 [IU] | INTRAMUSCULAR | Status: DC | PRN
  Filled 2019-11-30: qty 1

## 2019-11-30 MED ORDER — AMIODARONE IV BOLUS ONLY 150 MG/100ML
150.0000 mg | Freq: Once | INTRAVENOUS | Status: AC
  Administered 2019-11-30: 150 mg via INTRAVENOUS
  Filled 2019-11-30: qty 100

## 2019-11-30 MED ORDER — PERFLUTREN LIPID MICROSPHERE
1.0000 mL | INTRAVENOUS | Status: AC | PRN
  Administered 2019-11-30: 5 mL via INTRAVENOUS
  Filled 2019-11-30: qty 10

## 2019-11-30 MED ORDER — ALTEPLASE 2 MG IJ SOLR: 2.0000 mg | Freq: Once | INTRAMUSCULAR | Status: DC | PRN

## 2019-11-30 MED ORDER — PENTAFLUOROPROP-TETRAFLUOROETH EX AERO
1.0000 "application " | INHALATION_SPRAY | CUTANEOUS | Status: DC | PRN
  Filled 2019-11-30: qty 30

## 2019-11-30 MED ORDER — LIDOCAINE HCL (PF) 1 % IJ SOLN
5.0000 mL | INTRAMUSCULAR | Status: DC | PRN
  Filled 2019-11-30: qty 5

## 2019-11-30 MED ORDER — LIDOCAINE-PRILOCAINE 2.5-2.5 % EX CREA
1.0000 "application " | TOPICAL_CREAM | CUTANEOUS | Status: DC | PRN
  Filled 2019-11-30: qty 5

## 2019-11-30 NOTE — Progress Notes (Signed)
Kerry [name]  MRN: [id number]  DOB/AGE: 1933-09-14 86 y.o.  Primary Care Physician:Shapely-Quinn, Okey Regal, MD  Admit date: 11/28/2019  Chief Complaint: No chief complaint on file.   S-Pt presented on  11/28/2019 with No chief complaint on file. . Pt is currently being dialyzed. Pt is unable to offer any complaints.    meds  . chlorhexidine gluconate (MEDLINE KIT)  15 mL Mouth Rinse BID  . Chlorhexidine Gluconate Cloth  6 each Topical Daily  . Chlorhexidine Gluconate Cloth  6 each Topical Q0600  . fentaNYL (SUBLIMAZE) injection  50 mcg Intravenous Once  . insulin aspart  0-6 Units Subcutaneous Q4H  . mouth rinse  15 mL Mouth Rinse 10 times per day         EUM:PNTIRW to get any data.   Physical Exam: Vital signs in last 24 hours: Temp:  [96.6 F (35.9 C)-98.8 F (37.1 C)] 98.6 F (37 C) (06/26 0900) Pulse Rate:  [72-95] 95 (06/26 0900) Resp:  [18-22] 20 (06/26 0900) BP: (97-124)/(49-78) 124/65 (06/26 0900) SpO2:  [97 %-100 %] 97 % (06/26 0900) FiO2 (%):  [30 %-35 %] 30 % (06/26 0739) Weight:  [71.2 kg] 71.2 kg (06/26 0326) Weight change: 11.2 kg Last BM Date: 11/28/19  Intake/Output from previous day: 06/25 0701 - 06/26 0700 In: 3050.1 [I.V.:3050.1] Out: 0  No intake/output data recorded.   Physical Exam: General- pt is intubated  resp-Trach in situ ,Rhonchi+ CVS- S1S2 regular in rate and rhythm GIT- BS+, soft, NT, ND EXT- NO LE Edema, no Cyanosis Access fistula  Lab Results: CBC Recent Labs    11/29/19 0610 11/30/19 0415  WBC 20.0* 10.0  HGB 7.4* 7.3*  HCT 20.7* 20.6*  PLT 139* 123*    BMET Recent Labs    11/29/19 1810 11/30/19 0415  NA 144 143  K 3.9 3.4*  CL 99 98  CO2 31 33*  GLUCOSE 281* 168*  BUN 38* 39*  CREATININE 5.57* 5.89*  CALCIUM 7.6* 7.4*    MICRO Recent Results (from the past 240 hour(s))  Culture, blood (routine x 2)     Status: None (Preliminary result)   Collection Time: 11/28/19  1:31 PM   Specimen:  BLOOD  Result Value Ref Range Status   Specimen Description BLOOD BLOOD RIGHT ARM  Final   Special Requests   Final    BOTTLES DRAWN AEROBIC AND ANAEROBIC Blood Culture adequate volume   Culture  Setup Time PENDING  Incomplete   Culture   Final    NO GROWTH 2 DAYS Performed at [facility], [address], [place], [place] [address]    Report Status PENDING  Incomplete  Culture, blood (routine x 2)     Status: None (Preliminary result)   Collection Time: 11/28/19  2:17 PM   Specimen: BLOOD  Result Value Ref Range Status   Specimen Description BLOOD BLOOD RIGHT WRIST  Final   Special Requests   Final    BOTTLES DRAWN AEROBIC AND ANAEROBIC Blood Culture adequate volume   Culture   Final    NO GROWTH 2 DAYS Performed at [facility], [address] [place], [place] [address]    Report Status PENDING  Incomplete  SARS Coronavirus 2 by RT PCR (hospital order, performed in [facility] hospital lab) Nasopharyngeal Nasopharyngeal Swab     Status: None   Collection Time: 11/28/19  4:40 PM   Specimen: Nasopharyngeal Swab  Result Value Ref Range Status   SARS Coronavirus 2 NEGATIVE NEGATIVE  Final    Comment: (NOTE) SARS-CoV-2 target nucleic acids are NOT DETECTED.  The SARS-CoV-2 RNA is generally detectable in upper and lower respiratory specimens during the acute phase of infection. The lowest concentration of SARS-CoV-2 viral copies this assay can detect is 250 copies / mL. A negative result does not preclude SARS-CoV-2 infection and should not be used as the sole basis for treatment or other patient management decisions.  A negative result may occur with improper specimen collection / handling, submission of specimen other than nasopharyngeal swab, presence of viral mutation(s) within the areas targeted by this assay, and inadequate number of viral copies (<250 copies / mL). A negative result must be combined with clinical observations, patient history, and  epidemiological information.  Fact Sheet for Patients:   StrictlyIdeas.no  Fact Sheet for Healthcare Providers: BankingDealers.co.za  This test is not yet approved or  cleared by the [place] FDA and has been authorized for detection and/or diagnosis of SARS-CoV-2 by FDA under an Emergency Use Authorization (EUA).  This EUA will remain in effect (meaning this test can be used) for the duration of the COVID-19 declaration under Section 564(b)(1) of the Act, 21 U.S.C. section 360bbb-3(b)(1), unless the authorization is terminated or revoked sooner.  Performed at [facility], [address] [place], [place] [address]   MRSA PCR Screening     Status: None   Collection Time: 11/28/19  9:08 PM   Specimen: Nasopharyngeal  Result Value Ref Range Status   MRSA by PCR NEGATIVE NEGATIVE Final    Comment:        The GeneXpert MRSA Assay (FDA approved for NASAL specimens only), is one component of a comprehensive MRSA colonization surveillance program. It is not intended to diagnose MRSA infection nor to guide or monitor treatment for MRSA infections. 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 now s/p laryngectomy with tracheostomy who was admitted to the hospital on June 24 after cardiac arrest  1)Renal end-stage renal disease Patient is on hemodialysis Patient is on Tuesday Thursday Saturday schedule As outpatient patient undergoes dialysis under  [facility] in [place] on Tuesday Thursday Saturday schedule being followed by [facility] nephrology Pt is currently being dialyzed We are dialyzing on low BFR secondary to recent cardiac arrest   2) cardiac arrest Patient is s/p cardiac arrest Patient blood pressure is currently stable Patient is on hypothermia protocol  3)Anemia of chronic disease  HGb is not at goal (9--11)   4) secondary hyperparathyroidism -CKD Mineral-Bone Disorder   Secondary Hyperparathyroidism present..  Phosphorus at goal.   5) acute respiratory failure Patient is currently intubated  6) electrolytes   sodium Normonatremic   potassium Hypokalemic    7)Acid base Co2 at goal     Plan:   Pt is being dialyzed today Patient is tolerating treatment    Jancy Sprankle s [facility] 11/30/2019, 11:44 AM

## 2019-11-30 NOTE — Progress Notes (Signed)
Dr. Mortimer Fries made aware of patients potassium level of 3.3 post hemodialysis. He will place replacement orders.

## 2019-11-30 NOTE — Progress Notes (Signed)
As of 0125 on 11/30/2019, the patient has entered the Post Warming Normothermia Maintenance Phase. He will be controlled at 37 C for 48 hours. This phase will end on Monday, 12/02/2019 at 0125 unless discontinued.  Patient is experiencing frequent PVCs and small bursts of tachycardia with the rewarming process. Heart rate fluctuates between 90-120. Darlyn Chamber, NP aware. No new orders at this time. Will continue to monitor closely.  Cameron Ali, RN

## 2019-11-30 NOTE — Progress Notes (Signed)
This note also relates to the following rows which could not be included: Pulse Rate - Cannot attach notes to unvalidated device data Resp - Cannot attach notes to unvalidated device data SpO2 - Cannot attach notes to unvalidated device data  Hd sttarted

## 2019-11-30 NOTE — Progress Notes (Signed)
Spoke with Dr. Ubaldo Glassing regarding patients heart rate. 140-150, irregular with PVC's. Orders to give an amiodarone bolus. Pending labs after dialysis.

## 2019-11-30 NOTE — Progress Notes (Signed)
Dr. Mortimer Fries notified of patients heart rate. 140-150's PVC's. Patient with facial grimacing and appears to be uncomfortable. Orders to give 2 mg of morphine.

## 2019-11-30 NOTE — Progress Notes (Signed)
*  PRELIMINARY RESULTS* Echocardiogram 2D Echocardiogram has been performed. Definity IV Contrast used on this study.  Kerry [name] Kerry [name] 11/30/2019, 11:03 AM

## 2019-11-30 NOTE — Progress Notes (Signed)
Per Dr Mortimer Fries we will not give morphine and we will start precedex. Continue to assess.

## 2019-11-30 NOTE — Progress Notes (Signed)
Kerry [name] visited pt. per referral from charge RN; pt. lying in bed, drowsy, not speaking as far as [place] could see.  Wife Kerry [name] at bedside attending to pt. with washcloths, talking to him, etc.  [name] introduced himself; wife requested prayer for pt.'s healing and recovery, and for a blessing for all those hospitalized at [facility].  CH offered prayer for pt. and wife's sense of divine presence and peace.  Wife shared pt. became unresponsive while at dialysis on Thursday; wife expressed that he is getting better today --> she hopes pt. can be discharged after he is able to wake up.  CH remains available as needed.    11/30/19 1000  Clinical Encounter Type  Visited With Patient  Visit Type Initial;Spiritual support;Social support;Psychological support;Critical Care  Referral From Nurse  Consult/Referral To Chaplain  Spiritual Encounters  Spiritual Needs Emotional;Prayer  Stress Factors  Family Stress Factors Health changes;Major life changes;Loss of control

## 2019-11-30 NOTE — Progress Notes (Signed)
Patient Name: Kerry [name] Date of Encounter: 11/30/2019  Hospital Problem List     Active Problems:   Cardiac arrest [facility]    Patient Profile     86 y.o. male with history of 86 y.o.male patient who has a history of diabetes, hypertension, laryngeal carcinoma s/p tracheostomy, end-stage kidney disease currently on hemodialysis, hyperlipidemia and sleep apnea who was admitted after suffering a cardiac arrest during hemodialysis. Was in vf per report in dialysis and had two more cardiac arrest episodes while in the er.  He currently is in the code ice protocol and is on iv amiodarone, iv levophed. He has a fairly complicated past medical history including he has undergone placement of a tracheostomy due to laryngeal carcinoma and resultant laryngectomy. He also had a CT scan which showed coronary calcification however his coronary anatomy has not been documented . He is currently minimally responsive to verbal commands but does not appear to have purposeful movement.  His laboratories prior to the arrest showed a potassium of 2.7.  Currently is back up to 4.7.  His serum creatinine was 4.371-day ago 3.9 yesterday afternoon to 4.97 this morning.  Serum troponins was 61 on presentation and this peaked at 943.     Subjective   Somewhat more alert today. Opens eyes and nods appropriately to verbal questions and stimuli.   Inpatient Medications    . chlorhexidine gluconate (MEDLINE KIT)  15 mL Mouth Rinse BID  . Chlorhexidine Gluconate Cloth  6 each Topical Daily  . Chlorhexidine Gluconate Cloth  6 each Topical Q0600  . fentaNYL (SUBLIMAZE) injection  50 mcg Intravenous Once  . insulin aspart  0-6 Units Subcutaneous Q4H  . mouth rinse  15 mL Mouth Rinse 10 times per day    Vital Signs    Vitals:   11/30/19 0500 11/30/19 0600 11/30/19 0700 11/30/19 0800  BP: (!) 116/55 (!) 98/54 (!) 114/55 101/65  Pulse: 94 95 91 88  Resp: _0 Temp: 98.6 F (37 C) 98.6 F (37 C) 98.6 F  (37 C)   TempSrc: Rectal Rectal Rectal   SpO2: 98% 97% 97% 97%  Weight:      Height:        Intake/Output Summary (Last 24 hours) at 11/30/2019 0821 Last data filed at 11/30/2019 0800 Gross per 24 hour  Intake 3050.08 ml  Output 0 ml  Net 3050.08 ml   Filed Weights   11/28/19 2000 11/29/19 0330 11/30/19 0326  Weight: 60 kg 67.2 kg 71.2 kg    Physical Exam    GEN: Well nourished, well developed, in no acute distress.  HEENT: normal.  Neck: Supple, no JVD, carotid bruits, or masses. Cardiac: RRR, no murmurs, rubs, or gallops. No clubbing, cyanosis, edema.  Radials/DP/PT 2+ and equal bilaterally.  Respiratory:  Respirations regular and unlabored, clear to auscultation bilaterally. GI: Soft, nontender, nondistended, BS + x 4. MS: no deformity or atrophy. Skin: warm and dry, no rash. Neuro:  Strength and sensation are intact. Psych: Normal affect.  Labs    CBC Recent Labs    11/28/19 1331 11/28/19 1331 11/29/19 0610 11/30/19 0415  WBC 10.1   < > 20.0* 10.0  NEUTROABS 6.9  --  18.6*  --   HGB 9.2*   < > 7.4* 7.3*  HCT 25.8*   < > 20.7* 20.6*  MCV 92.1   < > 90.4 90.7  PLT 154   < > 139* 123*   < > = values  in this interval not displayed.   Basic Metabolic Panel Recent Labs    11/28/19 1919 11/28/19 1919 11/29/19 0610 11/29/19 0610 11/29/19 1810 11/30/19 0415  NA 145   < > 148*   < > 144 143  K 3.8   < > 4.7   < > 3.9 3.4*  CL 105   < > 103   < > 99 98  CO2 19*   < > 25   < > 31 33*  GLUCOSE 184*   < > 326*   < > 281* 168*  BUN 28*   < > 34*   < > 38* 39*  CREATININE 4.68*   < > 4.97*   < > 5.57* 5.89*  CALCIUM 8.2*   < > 8.0*   < > 7.6* 7.4*  MG 2.4  --  2.2  --   --   --   PHOS 4.7*   < > 5.4*  --   --  3.2   < > = values in this interval not displayed.   Liver Function Tests Recent Labs    11/28/19 1331 11/28/19 1331 11/28/19 1638 11/30/19 0415  AST 343*  --  386*  --   ALT 300*  --  315*  --   ALKPHOS 65  --  51  --   BILITOT 1.5*  --  1.3*   --   PROT 5.8*  --  4.2*  --   ALBUMIN 3.3*   < > 2.3* 2.8*   < > = values in this interval not displayed.   No results for input(s): LIPASE, AMYLASE in the last 72 hours. Cardiac Enzymes No results for input(s): CKTOTAL, CKMB, CKMBINDEX, TROPONINI in the last 72 hours. BNP Recent Labs    11/28/19 1331  BNP 4,339.6*   D-Dimer No results for input(s): DDIMER in the last 72 hours. Hemoglobin A1C No results for input(s): HGBA1C in the last 72 hours. Fasting Lipid Panel Recent Labs    11/29/19 0610  TRIG 69   Thyroid Function Tests No results for input(s): TSH, T4TOTAL, T3FREE, THYROIDAB in the last 72 hours.  Invalid input(s): FREET3  Telemetry    Sinus rhythm with nonsustained wide complex runs.   ECG    nsr with no ischemia  Radiology    CT HEAD WO CONTRAST  Result Date: 11/28/2019 CLINICAL DATA:  Status post cardiac arrest. EXAM: CT HEAD WITHOUT CONTRAST TECHNIQUE: Contiguous axial images were obtained from the base of the skull through the vertex without intravenous contrast. COMPARISON:  None. FINDINGS: Brain: There is mild cerebral atrophy with widening of the extra-axial spaces and ventricular dilatation. There are areas of decreased attenuation within the white matter tracts of the supratentorial brain, consistent with microvascular disease changes. Vascular: No hyperdense vessel or unexpected calcification. Skull: Normal. Negative for fracture or focal lesion. Sinuses/Orbits: No acute finding. Other: None. IMPRESSION: 1. Generalized cerebral atrophy. 2. No acute intracranial abnormality. Electronically Signed   By: Virgina Norfolk M.D.   On: 11/28/2019 18:06   DG Chest Port 1 View  Result Date: 11/30/2019 CLINICAL DATA:  Acute respiratory failure EXAM: PORTABLE CHEST 1 VIEW COMPARISON:  11/28/2019 FINDINGS: Single frontal view of the chest demonstrates tracheostomy tube overlying tracheal air column tip at thoracic inlet. External defibrillator pads are again  noted. Cardiac silhouette is unremarkable. Since the prior exam, there is decreased interstitial prominence. Persistent central vascular congestion with trace right pleural effusion. No pneumothorax. IMPRESSION: 1. Persistent central vascular congestion and trace  right pleural effusion, with improving interstitial edema since prior exam. Electronically Signed   By: Randa Ngo M.D.   On: 11/30/2019 03:14   DG Chest Portable 1 View  Result Date: 11/28/2019 CLINICAL DATA:  Status post cardiac arrest during dialysis. EXAM: PORTABLE CHEST 1 VIEW COMPARISON:  Chest x-ray dated 07/19/2017 FINDINGS: Endotracheal tube in good position 4.7 cm above the carina. Heart size is normal. Slight bilateral haziness, left greater than right, likely represents mild edema. No consolidative infiltrates or effusions. No bone abnormality. IMPRESSION: 1. Endotracheal tube in good position. 2. Mild pulmonary edema. Electronically Signed   By: Lorriane Shire M.D.   On: 11/28/2019 14:33   EEG adult  Result Date: 11/29/2019 Alexis Goodell, MD     11/29/2019  7:02 PM ELECTROENCEPHALOGRAM REPORT Patient: Debra Calabretta       Room #: IC17A-AA EEG No. ID: [id number] Age: 86 y.o.        Sex: male Requesting Physician: Kasa Report Date:  11/29/2019       Interpreting Physician: Alexis Goodell History: Nitesh Pitstick is an 86 y.o. male s/p arrest Medications: Propofol, Fentanyl, Levophed, Insulin Conditions of Recording:  This is a 21 channel routine scalp EEG performed with bipolar and monopolar montages arranged in accordance to the international 10/20 system of electrode placement. One channel was dedicated to EKG recording. The patient is in the intubated and sedated state. Description:  The background activity is slow and poorly organized.  It consists of a low voltage polymorphic delta activity that is continuous and diffusely distributed.  No epileptiform activity is noted.  There is no activation of the background rhythm noted with  noxious stimuli.  Marland Kitchen  Hyperventilation and intermittent photic stimulation were not performed. IMPRESSION: This is an abnormal EEG secondary to general background slowing.  This finding may be seen with a diffuse cerebral disturbance that is etiologically nonspecific, but may include a medication effect, among other possibilities.  No epileptiform activity was noted.  Background lacked reactivity.  Alexis Goodell, MD Neurology [phone] 11/29/2019, 6:58 PM    Assessment & Plan    86 year old male with history of end-stage renal disease on hemodialysis, history of laryngeal carcinoma status post tracheostomy, history of hyperlipidemia and sleep apnea who suffered a cardiac arrest during hemodialysis yesterday.  Was noted to be in VF was cardioverted out of this.  Had 2 more V. tach episodes in the ER.  As of now his potassium was 2.7 when this started.  He currently is in sinus rhythm intubated on a code ice protocol in the ICU on pressors.  Minimal response to verbal stimuli.  Serum troponin increased to 943.  Was 69 on initial evaluation.  1.  Cardiac arrest-appears to be in a primary VF arrest.  May have been exacerbated by low serum potassium at 2.7.  Patient's cardiac markers, high-sensitivity troponin are mildly elevated.  Not surprising there is elevation given ventricular arrhythmia and resuscitation from this.  Certainly coronary disease may be playing a role however not a candidate for invasive evaluation at present.  Echo done today revealed ef of 25-30% which is not changed from his previous echo.  We will continue to maintain potassium greater than 4 magnesium greater than 2.  Currently on IV amiodarone.  We will continue with this for now.Heparin discontinued due to bleeding and anemia. Will remain off of heparin.   2.  End-stage renal disease-on hemodialysis.  Continue nephrology evaluation and treatment.  3.  Elevated high-sensitivity troponin-May be secondary to  the arrest.  Coronary  disease is possible but given fairly blunted response, does not appear to be in a primary ischemic event.  We will remain noff of heparin or other anticoagulation/antiplatelet therapy due to bleeding and anemia.   Further invasive/noninvasive evaluation pending course.    4.  Laryngeal carcinoma-stable.  Has tracheostomy     Signed, Javier Docker. Artha Chiasson MD 11/30/2019, 8:21 AM  Pager: [phone]

## 2019-11-30 NOTE — Progress Notes (Signed)
This note also relates to the following rows which could not be included: Pulse Rate - Cannot attach notes to unvalidated device data Resp - Cannot attach notes to unvalidated device data SpO2 - Cannot attach notes to unvalidated device data  Pt has had bp drop 80/43 , uf paused for bp recovery.

## 2019-11-30 NOTE — Progress Notes (Signed)
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   Apparently patient lost pulse while having his the most recent dialysis session. Patient had also developed A. fib RVR and was apneic in the field and required bag mask ventilation. After arrival to the ER patient again coded x2 with V. fib arrest status post ACLS and ROSC, found to be amiodarone and 20 MCG Levophed. wife at bedside.  Lines / Drains: Left upper extremity HD access, I/O positive  Cultures / Sepsis markers: Tracheal aspirate  Antibiotics: Vancomycin and cefepime   Protocols / Consultants: PCCM  Tests / Events: 6/24 prolonged CARDIAC ARREST 6/24 HYPOTHERMIA PROTOCOL    CC  follow up respiratory failure Cardiac arrest  SUBJECTIVE Patient remains critically ill Prognosis is guarded Cardiac arrest Multiorgan failure    BP (!) 114/55   Pulse 91   Temp 98.6 F (37 C) (Rectal)   Resp 20   Ht 5' 6"  (1.676 m)   Wt 71.2 kg   SpO2 97%   BMI 25.34 kg/m    I/O last 3 completed shifts: In: 4540.7 [I.V.:4340.7; IV Piggyback:200.1] Out: 0  No intake/output data recorded.  SpO2: 97 % FiO2 (%): 30 %  Estimated body mass index is 25.34 kg/m as calculated from the following:   Height as of this encounter: 5' 6"  (1.676 m).   Weight as of this encounter: 71.2 kg.  SIGNIFICANT EVENTS   REVIEW OF SYSTEMS  PATIENT IS UNABLE TO PROVIDE COMPLETE REVIEW OF SYSTEMS DUE TO SEVERE CRITICAL ILLNESS        PHYSICAL EXAMINATION:  GENERAL:critically ill  appearing, +resp distress HEAD: Normocephalic, atraumatic.  EYES: Pupils equal, round, reactive to light.  No scleral icterus.  MOUTH: Moist mucosal membrane. NECK: Supple.  PULMONARY: +rhonchi, +wheezing CARDIOVASCULAR: S1 and S2. Regular rate and rhythm. No murmurs, rubs, or gallops.  GASTROINTESTINAL: Soft, nontender, -distended.  Positive bowel sounds.   MUSCULOSKELETAL: No swelling, clubbing, or edema.  NEUROLOGIC: obtunded, GCS<8 SKIN:intact,warm,dry  MEDICATIONS: I have reviewed all medications and confirmed regimen as documented   CULTURE RESULTS   Recent Results (from the past 240 hour(s))  Culture, blood (routine x 2)     Status: None (Preliminary result)   Collection Time: 11/28/19  1:31 PM   Specimen: BLOOD  Result Value Ref Range Status   Specimen Description BLOOD BLOOD RIGHT ARM  Final   Special Requests   Final    BOTTLES DRAWN AEROBIC AND ANAEROBIC Blood Culture adequate volume   Culture  Setup Time PENDING  Incomplete   Culture   Final    NO GROWTH 2 DAYS Performed at [facility], [address], [place], [place] [address]    Report Status PENDING  Incomplete  Culture, blood (routine x 2)     Status: None (Preliminary result)   Collection Time: 11/28/19  2:17 PM   Specimen: BLOOD  Result Value Ref Range Status   Specimen Description BLOOD BLOOD RIGHT WRIST  Final   Special Requests   Final    BOTTLES DRAWN AEROBIC AND ANAEROBIC Blood Culture adequate volume   Culture   Final  NO GROWTH 2 DAYS Performed at [facility], [address] [place], [place] [address]    Report Status PENDING  Incomplete  SARS Coronavirus 2 by RT PCR (hospital order, performed in [facility] hospital lab) Nasopharyngeal Nasopharyngeal Swab     Status: None   Collection Time: 11/28/19  4:40 PM   Specimen: Nasopharyngeal Swab  Result Value Ref Range Status   SARS Coronavirus 2 NEGATIVE NEGATIVE Final    Comment: (NOTE) SARS-CoV-2 target nucleic acids  are NOT DETECTED.  The SARS-CoV-2 RNA is generally detectable in upper and lower respiratory specimens during the acute phase of infection. The lowest concentration of SARS-CoV-2 viral copies this assay can detect is 250 copies / mL. A negative result does not preclude SARS-CoV-2 infection and should not be used as the sole basis for treatment or other patient management decisions.  A negative result may occur with improper specimen collection / handling, submission of specimen other than nasopharyngeal swab, presence of viral mutation(s) within the areas targeted by this assay, and inadequate number of viral copies (<250 copies / mL). A negative result must be combined with clinical observations, patient history, and epidemiological information.  Fact Sheet for Patients:   StrictlyIdeas.no  Fact Sheet for Healthcare Providers: BankingDealers.co.za  This test is not yet approved or  cleared by the [place] FDA and has been authorized for detection and/or diagnosis of SARS-CoV-2 by FDA under an Emergency Use Authorization (EUA).  This EUA will remain in effect (meaning this test can be used) for the duration of the COVID-19 declaration under Section 564(b)(1) of the Act, 21 U.S.C. section 360bbb-3(b)(1), unless the authorization is terminated or revoked sooner.  Performed at [facility], [address] [place], [place] [address]   MRSA PCR Screening     Status: None   Collection Time: 11/28/19  9:08 PM   Specimen: Nasopharyngeal  Result Value Ref Range Status   MRSA by PCR NEGATIVE NEGATIVE Final    Comment:        The GeneXpert MRSA Assay (FDA approved for NASAL specimens only), is one component of a comprehensive MRSA colonization surveillance program. It is not intended to diagnose MRSA infection nor to guide or monitor treatment for MRSA infections. Performed at [facility], [address] [place], [place] [address]           IMAGING    DG Chest Port 1 View  Result Date: 11/30/2019 CLINICAL DATA:  Acute respiratory failure EXAM: PORTABLE CHEST 1 VIEW COMPARISON:  11/28/2019 FINDINGS: Single frontal view of the chest demonstrates tracheostomy tube overlying tracheal air column tip at thoracic inlet. External defibrillator pads are again noted. Cardiac silhouette is unremarkable. Since the prior exam, there is decreased interstitial prominence. Persistent central vascular congestion with trace right pleural effusion. No pneumothorax. IMPRESSION: 1. Persistent central vascular congestion and trace right pleural effusion, with improving interstitial edema since prior exam. Electronically Signed   By: Randa Ngo M.D.   On: 11/30/2019 03:14   EEG adult  Result Date: 11/29/2019 Alexis Goodell, MD     11/29/2019  7:02 PM ELECTROENCEPHALOGRAM REPORT Patient: Aldridge Krzyzanowski       Room #: IC17A-AA EEG No. ID: [id number] Age: 86 y.o.        Sex: male Requesting Physician: Kenlie Seki Report Date:  11/29/2019       Interpreting Physician: Alexis Goodell History: Rylie Knierim is an 86 y.o. male s/p arrest Medications: Propofol, Fentanyl, Levophed, Insulin Conditions of Recording:  This is a 21 channel routine scalp EEG performed with bipolar and monopolar montages arranged in accordance to the international 10/20 system of electrode placement. One channel was dedicated to EKG recording. The patient is in the intubated and sedated state. Description:  The background activity is slow and poorly organized.  It consists of a low voltage polymorphic delta activity that is continuous and diffusely distributed.  No epileptiform activity is noted.  There is no activation of the background rhythm noted with noxious stimuli.  Marland Kitchen  Hyperventilation and intermittent photic stimulation were not performed. IMPRESSION: This is an abnormal EEG secondary to general background slowing.  This finding may be seen with a  diffuse cerebral disturbance that is etiologically nonspecific, but may include a medication effect, among other possibilities.  No epileptiform activity was noted.  Background lacked reactivity.  Alexis Goodell, MD Neurology [phone] 11/29/2019, 6:58 PM     Nutrition Status: Nutrition Problem: Inadequate oral intake Etiology: inability to eat (Pt sedated and ventilated) Signs/Symptoms: NPO status       Indwelling Urinary Catheter continued, requirement due to   Reason to continue Indwelling Urinary Catheter strict Intake/Output monitoring for hemodynamic instability   Central Line/ continued, requirement due to  Reason to continue [place] of central venous pressure or other hemodynamic parameters and poor IV access   Ventilator continued, requirement due to severe respiratory failure   Ventilator Sedation RASS 0 to -2      ASSESSMENT AND PLAN SYNOPSIS  Severe ACUTE Hypoxic and Hypercapnic Respiratory Failure due to acute NSTEMI and acute cardiac arrest with multiorgan failure   Severe ACUTE Hypoxic and Hypercapnic Respiratory Failure -continue Full MV support -continue Bronchodilator Therapy -Wean Fio2 and PEEP as tolerated -will perform SAT/SBT when respiratory parameters are met -VAP/VENT bundle implementation  NSTEMI/CARDIAC ARREST ACUTE SYSTOLIC CARDIAC FAILURE-ECHO PENDING -follow up cardiac enzymes as indicated -follow up cardiology recs   END STAGE KIDNEY INJURY/Renal Failure -Avoid nephrotoxic agents -Follow urine output, BMP -Ensure adequate renal perfusion, optimize oxygenation -Renal dose medications HD as needed     NEUROLOGY - intubated and sedated - minimal sedation to achieve a RASS goal: -1 Wake up assessment pending   SHOCK-SEPSIS/HYPOVOLUMIC/CARDIOGENIC -use vasopressors to keep MAP>65 as needed   CARDIAC ICU monitoring  ID -continue IV abx as prescibed -follow up cultures  GI GI PROPHYLAXIS as  indicated   DIET-->TF's as tolerated Constipation protocol as indicated  ENDO - will use ICU hypoglycemic\Hyperglycemia protocol if indicated     ELECTROLYTES -follow labs as needed -replace as needed -pharmacy consultation and following   DVT/GI PRX ordered and assessed TRANSFUSIONS AS NEEDED MONITOR FSBS I Assessed the need for Labs I Assessed the need for Foley I Assessed the need for Central Venous Line Family Discussion when available I Assessed the need for Mobilization I made an Assessment of medications to be adjusted accordingly Safety Risk assessment completed   CASE DISCUSSED IN MULTIDISCIPLINARY ROUNDS WITH ICU TEAM  Critical Care Time devoted to patient care services described in this note is 32 minutes.   Overall, patient is critically ill, prognosis is guarded.  Patient with Multiorgan failure and at high risk for cardiac arrest and death.    Corrin Parker, M.D.  [facility]  Medical Director [place] Director [facility] Cardio-Pulmonary Department

## 2019-12-01 LAB — CBC
HCT: 20.1 % — ABNORMAL LOW (ref 39.0–52.0)
Hemoglobin: 7.4 g/dL — ABNORMAL LOW (ref 13.0–17.0)
MCH: 31.9 pg (ref 26.0–34.0)
MCHC: 36.8 g/dL — ABNORMAL HIGH (ref 30.0–36.0)
MCV: 86.6 fL (ref 80.0–100.0)
Platelets: 132 10*3/uL — ABNORMAL LOW (ref 150–400)
RBC: 2.32 MIL/uL — ABNORMAL LOW (ref 4.22–5.81)
RDW: 15.9 % — ABNORMAL HIGH (ref 11.5–15.5)
WBC: 7.9 10*3/uL (ref 4.0–10.5)
nRBC: 0 % (ref 0.0–0.2)

## 2019-12-01 LAB — RENAL FUNCTION PANEL
Albumin: 2.6 g/dL — ABNORMAL LOW (ref 3.5–5.0)
Anion gap: 12 (ref 5–15)
BUN: 24 mg/dL — ABNORMAL HIGH (ref 8–23)
CO2: 33 mmol/L — ABNORMAL HIGH (ref 22–32)
Calcium: 7.4 mg/dL — ABNORMAL LOW (ref 8.9–10.3)
Chloride: 92 mmol/L — ABNORMAL LOW (ref 98–111)
Creatinine, Ser: 3.91 mg/dL — ABNORMAL HIGH (ref 0.61–1.24)
GFR calc Af Amer: 15 mL/min — ABNORMAL LOW (ref >60)
GFR calc non Af Amer: 13 mL/min — ABNORMAL LOW (ref >60)
Glucose, Bld: 205 mg/dL — ABNORMAL HIGH (ref 70–99)
Phosphorus: 2.4 mg/dL — ABNORMAL LOW (ref 2.5–4.6)
Potassium: 4.2 mmol/L (ref 3.5–5.1)
Sodium: 137 mmol/L (ref 135–145)

## 2019-12-01 LAB — GLUCOSE, CAPILLARY
Glucose-Capillary: 185 mg/dL — ABNORMAL HIGH (ref 70–99)
Glucose-Capillary: 205 mg/dL — ABNORMAL HIGH (ref 70–99)
Glucose-Capillary: 191 mg/dL — ABNORMAL HIGH (ref 70–99)
Glucose-Capillary: 135 mg/dL — ABNORMAL HIGH (ref 70–99)
Glucose-Capillary: 120 mg/dL — ABNORMAL HIGH (ref 70–99)

## 2019-12-01 LAB — PARATHYROID HORMONE, INTACT (NO CA): PTH: 436 pg/mL — ABNORMAL HIGH (ref 15–65)

## 2019-12-01 MED ORDER — MIDAZOLAM HCL 2 MG/2ML IJ SOLN
1.0000 mg | INTRAMUSCULAR | Status: DC | PRN
  Administered 2019-12-01: 1 mg via INTRAVENOUS
  Filled 2019-12-01: qty 2

## 2019-12-01 MED ORDER — DOCUSATE SODIUM 50 MG/5ML PO LIQD: 100.0000 mg | Freq: Two times a day (BID) | ORAL | Status: DC

## 2019-12-01 MED ORDER — MIDAZOLAM HCL 2 MG/2ML IJ SOLN
2.0000 mg | Freq: Once | INTRAMUSCULAR | Status: DC
  Filled 2019-12-01: qty 2

## 2019-12-01 MED ORDER — POLYETHYLENE GLYCOL 3350 17 G PO PACK
17.0000 g | PACK | Freq: Every day | ORAL | Status: DC
  Administered 2019-12-06 – 2019-12-08 (×2): 17 g via ORAL
  Filled 2019-12-01 (×3): qty 1

## 2019-12-01 MED ORDER — FENTANYL CITRATE (PF) 100 MCG/2ML IJ SOLN
25.0000 ug | INTRAMUSCULAR | Status: DC | PRN
  Administered 2019-12-03: 50 ug via INTRAVENOUS
  Filled 2019-12-01: qty 2

## 2019-12-01 MED ORDER — MIDAZOLAM HCL 2 MG/2ML IJ SOLN: 1.0000 mg | INTRAMUSCULAR | Status: DC | PRN

## 2019-12-01 MED ORDER — BISACODYL 10 MG RE SUPP: 10.0000 mg | Freq: Every day | RECTAL | Status: DC | PRN

## 2019-12-01 MED ORDER — FENTANYL CITRATE (PF) 100 MCG/2ML IJ SOLN
25.0000 ug | INTRAMUSCULAR | Status: AC | PRN
  Administered 2019-12-01 – 2019-12-02 (×3): 25 ug via INTRAVENOUS
  Filled 2019-12-01 (×3): qty 2

## 2019-12-01 NOTE — Progress Notes (Signed)
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   Apparently patient lost pulse while having his the most recent dialysis session. Patient had also developed A. fib RVR and was apneic in the field and required bag mask ventilation. After arrival to the ER patient again coded x2 with V. fib arrest status post ACLS and ROSC, found to be amiodarone and 20 MCG Levophed.wifeat bedside.  Lines / Drains: Left upper extremity HD access, I/O positive  Cultures / Sepsis markers: Tracheal aspirate  Antibiotics: Vancomycin and cefepime   Protocols / Consultants: PCCM  Tests / Events: 6/24 prolonged CARDIAC ARREST 6/24 HYPOTHERMIA PROTOCOL 6/25 cardiogenic shock on vent and pressors 6/26 increased pressors for HD 6/27 remains on vent, +delerium, +pressors     CC  follow up respiratory failure  SUBJECTIVE Patient remains critically ill Prognosis is guarded S/p trach On vent, on pressors +cardiogenic shock  Vent Mode: PRVC FiO2 (%):  [30 %] 30 % Set Rate:  [20 bmp] 20 bmp Vt Set:  [400 mL] 400 mL PEEP:  [5 cmH20] 5 cmH20 Plateau Pressure:  [17 cmH20-20 cmH20] 18 cmH20    BP (!) 107/59   Pulse 81   Temp 98.6 F (37 C) (Rectal)   Resp 20   Ht 5' 6"  (1.676 m)   Wt 72.1 kg   SpO2 100%   BMI 25.66 kg/m    I/O last 3 completed shifts: In: 5967.6 [I.V.:5667.6; IV Piggyback:300] Out: 256 [Other:256] No intake/output data recorded.  SpO2: 100 % FiO2 (%): 30 %  Estimated body mass index is 25.66 kg/m as calculated  from the following:   Height as of this encounter: 5' 6"  (1.676 m).   Weight as of this encounter: 72.1 kg.  SIGNIFICANT EVENTS   REVIEW OF SYSTEMS  PATIENT IS UNABLE TO PROVIDE COMPLETE REVIEW OF SYSTEMS DUE TO SEVERE CRITICAL ILLNESS        PHYSICAL EXAMINATION:  GENERAL:critically ill appearing, +resp distress HEAD: Normocephalic, atraumatic.  EYES: Pupils equal, round, reactive to light.  No scleral icterus.  MOUTH: Moist mucosal membrane. NECK: Supple. S/p trach PULMONARY: +rhonchi, +wheezing CARDIOVASCULAR: S1 and S2. Regular rate and rhythm. No murmurs, rubs, or gallops.  GASTROINTESTINAL: Soft, nontender, -distended.  Positive bowel sounds.   MUSCULOSKELETAL: +edema.  NEUROLOGIC: obtunded, GCS<8 SKIN:intact,warm,dry  MEDICATIONS: I have reviewed all medications and confirmed regimen as documented   CULTURE RESULTS   Recent Results (from the past 240 hour(s))  Culture, blood (routine x 2)     Status: None (Preliminary result)   Collection Time: 11/28/19  1:31 PM   Specimen: BLOOD  Result Value Ref Range Status   Specimen Description BLOOD BLOOD RIGHT ARM  Final   Special Requests   Final    BOTTLES DRAWN AEROBIC AND ANAEROBIC Blood Culture adequate volume   Culture  Setup Time PENDING  Incomplete   Culture   Final    NO GROWTH 2 DAYS Performed at [facility], [address] [place], [place] [address]    Report Status PENDING  Incomplete  Culture, blood (routine x 2)     Status: None (Preliminary result)  Collection Time: 11/28/19  2:17 PM   Specimen: BLOOD  Result Value Ref Range Status   Specimen Description BLOOD BLOOD RIGHT WRIST  Final   Special Requests   Final    BOTTLES DRAWN AEROBIC AND ANAEROBIC Blood Culture adequate volume   Culture   Final    NO GROWTH 2 DAYS Performed at [facility], [address] [place], [place] [address]    Report Status PENDING  Incomplete  SARS Coronavirus 2 by RT PCR (hospital order,  performed in [place] hospital lab) Nasopharyngeal Nasopharyngeal Swab     Status: None   Collection Time: 11/28/19  4:40 PM   Specimen: Nasopharyngeal Swab  Result Value Ref Range Status   SARS Coronavirus 2 NEGATIVE NEGATIVE Final    Comment: (NOTE) SARS-CoV-2 target nucleic acids are NOT DETECTED.  The SARS-CoV-2 RNA is generally detectable in upper and lower respiratory specimens during the acute phase of infection. The lowest concentration of SARS-CoV-2 viral copies this assay can detect is 250 copies / mL. A negative result does not preclude SARS-CoV-2 infection and should not be used as the sole basis for treatment or other patient management decisions.  A negative result may occur with improper specimen collection / handling, submission of specimen other than nasopharyngeal swab, presence of viral mutation(s) within the areas targeted by this assay, and inadequate number of viral copies (<250 copies / mL). A negative result must be combined with clinical observations, patient history, and epidemiological information.  Fact Sheet for Patients:   StrictlyIdeas.no  Fact Sheet for Healthcare Providers: BankingDealers.co.za  This test is not yet approved or  cleared by the [place] FDA and has been authorized for detection and/or diagnosis of SARS-CoV-2 by FDA under an Emergency Use Authorization (EUA).  This EUA will remain in effect (meaning this test can be used) for the duration of the COVID-19 declaration under Section 564(b)(1) of the Act, 21 U.S.C. section 360bbb-3(b)(1), unless the authorization is terminated or revoked sooner.  Performed at [facility], [address] [place], [place] [address]   MRSA PCR Screening     Status: None   Collection Time: 11/28/19  9:08 PM   Specimen: Nasopharyngeal  Result Value Ref Range Status   MRSA by PCR NEGATIVE NEGATIVE Final    Comment:        The GeneXpert  MRSA Assay (FDA approved for NASAL specimens only), is one component of a comprehensive MRSA colonization surveillance program. It is not intended to diagnose MRSA infection nor to guide or monitor treatment for MRSA infections. Performed at [facility], [address] [place], [place] [address]           IMAGING    ECHOCARDIOGRAM COMPLETE  Result Date: 11/30/2019    ECHOCARDIOGRAM REPORT   Patient Name:   [facility] Date of Exam: 11/30/2019 Medical Rec #:  [id number]     Height:       66.0 in Accession #:    [id number]    Weight:       157.0 lb Date of Birth:  1933/08/26     BSA:          1.804 m Patient Age:    86 years      BP:           114/55 mmHg Patient Gender: M             HR:           98 bpm. Exam  Location:  [facility] Procedure: 2D Echo and Intracardiac Opacification Agent Indications:     Cardiac Arrest I46.9  History:         Patient has no prior history of Echocardiogram examinations.  Sonographer:     Arville Go RDCS Referring Phys:  [id number] Awilda Bill Diagnosing Phys: Bartholome Bill MD  Sonographer Comments: Echo performed with patient supine and on artificial respirator and Technically difficult study due to poor echo windows. IMPRESSIONS  1. Left ventricular ejection fraction, by estimation, is 25 to 30%. Left ventricular ejection fraction by PLAX is 26 %. The left ventricle has severely decreased function. The left ventricle has no regional wall motion abnormalities. The left ventricular internal cavity size was moderately dilated. Left ventricular diastolic parameters are consistent with Grade I diastolic dysfunction (impaired relaxation).  2. Right ventricular systolic function is normal. The right ventricular size is mildly enlarged.  3. The mitral valve was not well visualized. Trivial mitral valve regurgitation.  4. The aortic valve was not well visualized. Aortic valve regurgitation is trivial. Mild aortic valve sclerosis is present, with no evidence of  aortic valve stenosis. FINDINGS  Left Ventricle: Left ventricular ejection fraction, by estimation, is 25 to 30%. Left ventricular ejection fraction by PLAX is 26 %. The left ventricle has severely decreased function. The left ventricle has no regional wall motion abnormalities. Definity contrast agent was given IV to delineate the left ventricular endocardial borders. The left ventricular internal cavity size was moderately dilated. There is no left ventricular hypertrophy. Left ventricular diastolic parameters are consistent with Grade I diastolic dysfunction (impaired relaxation). Right Ventricle: The right ventricular size is mildly enlarged. No increase in right ventricular wall thickness. Right ventricular systolic function is normal. Left Atrium: Left atrial size was normal in size. Right Atrium: Right atrial size was normal in size. Pericardium: There is no evidence of pericardial effusion. Mitral Valve: The mitral valve was not well visualized. Trivial mitral valve regurgitation. Tricuspid Valve: The tricuspid valve is not well visualized. Tricuspid valve regurgitation is mild. Aortic Valve: The aortic valve was not well visualized. Aortic valve regurgitation is trivial. Mild aortic valve sclerosis is present, with no evidence of aortic valve stenosis. Aortic valve peak gradient measures 6.4 mmHg. Pulmonic Valve: The pulmonic valve was not well visualized. Pulmonic valve regurgitation is trivial. Aorta: The aortic root was not well visualized. IAS/Shunts: The interatrial septum was not assessed.  LEFT VENTRICLE PLAX 2D LV EF:         Left            Diastology                ventricular     LV e' lateral:   3.05 cm/s                ejection        LV E/e' lateral: 28.8                fraction by     LV e' medial:    5.00 cm/s                PLAX is 26      LV E/e' medial:  17.6                %. LVIDd:         4.98 cm LVIDs:         4.39 cm LV PW:         1.16  cm LV IVS:        1.11 cm LVOT diam:     2.00  cm LV SV:         43 LV SV Index:   24 LVOT Area:     3.14 cm  LV Volumes (MOD) LV vol d, MOD    182.0 ml A2C: LV vol d, MOD    159.0 ml A4C: LV vol s, MOD    136.0 ml A2C: LV vol s, MOD    126.0 ml A4C: LV SV MOD A2C:   46.0 ml LV SV MOD A4C:   159.0 ml LV SV MOD BP:    38.5 ml RIGHT VENTRICLE RV Basal diam:  3.65 cm RV S prime:     14.00 cm/s TAPSE (M-mode): 2.1 cm LEFT ATRIUM              Index       RIGHT ATRIUM           Index LA diam:        3.40 cm  1.88 cm/m  RA Area:     16.40 cm LA Vol (A2C):   119.0 ml 65.96 ml/m RA Volume:   45.90 ml  25.44 ml/m LA Vol (A4C):   83.3 ml  46.17 ml/m LA Biplane Vol: 99.1 ml  54.93 ml/m  AORTIC VALVE                PULMONIC VALVE AV Area (Vmax): 2.07 cm    PV Vmax:       1.14 m/s AV Vmax:        126.00 cm/s PV Peak grad:  5.2 mmHg AV Peak Grad:   6.4 mmHg LVOT Vmax:      82.90 cm/s LVOT Vmean:     53.500 cm/s LVOT VTI:       0.137 m  AORTA Ao Root diam: 3.60 cm Ao Asc diam:  3.70 cm MITRAL VALVE MV Area (PHT): 4.15 cm     SHUNTS MV Decel Time: 183 msec     Systemic VTI:  0.14 m MV E velocity: 87.80 cm/s   Systemic Diam: 2.00 cm MV A velocity: 103.00 cm/s MV E/A ratio:  0.85 Bartholome Bill MD Electronically signed by Bartholome Bill MD Signature Date/Time: 11/30/2019/11:41:25 AM    Final      Nutrition Status: Nutrition Problem: Inadequate oral intake Etiology: inability to eat (Pt sedated and ventilated) Signs/Symptoms: NPO status       Indwelling Urinary Catheter continued, requirement due to   Reason to continue Indwelling Urinary Catheter strict Intake/Output monitoring for hemodynamic instability   Central Line/ continued, requirement due to  Reason to continue Walls of central venous pressure or other hemodynamic parameters and poor IV access   Ventilator continued, requirement due to severe respiratory failure   Ventilator Sedation RASS 0 to -2      ASSESSMENT AND PLAN SYNOPSIS  Severe ACUTE Hypoxic and Hypercapnic  Respiratory Failuredue to acute NSTEMI and acute cardiac arrest with multiorgan failure +ischemic cardiomyopathy awaiting NEURO ASSESSMENTs    Severe ACUTE Hypoxic and Hypercapnic Respiratory Failure -continue Full MV support -continue Bronchodilator Therapy -Wean Fio2 and PEEP as tolerated -will perform SAT/SBT when respiratory parameters are met -VAP/VENT bundle implementation  NSTEMI/cardiac arrest  ACUTE SYSTOLIC CARDIAC FAILURE -follow up cardiology recs   END STAGE KIDNEY INJURY/Renal Failure -Avoid nephrotoxic agents -Follow urine output, BMP -Ensure adequate renal perfusion, optimize oxygenation -Renal dose medications     NEUROLOGY Acute toxic  metabolic encephalopathy-wean off sedation Consider MRI   SHOCK-CARDIOGENIC -use vasopressors to keep MAP>65 -follow ABG and LA -follow up cultures -emperic ABX   CARDIAC ICU monitoring  ID -continue IV abx as prescibed -follow up cultures  GI GI PROPHYLAXIS as indicated  NUTRITIONAL STATUS Nutrition Status: Nutrition Problem: Inadequate oral intake Etiology: inability to eat (Pt sedated and ventilated) Signs/Symptoms: NPO status     DIET-->NPO Constipation protocol as indicated  ENDO - will use ICU hypoglycemic\Hyperglycemia protocol if indicated    ELECTROLYTES -follow labs as needed -replace as needed -pharmacy consultation and following   DVT/GI PRX ordered and assessed TRANSFUSIONS AS NEEDED MONITOR FSBS I Assessed the need for Labs I Assessed the need for Foley I Assessed the need for Central Venous Line Family Discussion when available I Assessed the need for Mobilization I made an Assessment of medications to be adjusted accordingly Safety Risk assessment completed    Critical Care Time devoted to patient care services described in this note is 32 minutes.   Overall, patient is critically ill, prognosis is guarded.     Corrin Parker, M.D.  [facility]  Medical Director [place] Director [facility] Cardio-Pulmonary Department

## 2019-12-01 NOTE — Progress Notes (Addendum)
Kerry [name]  MRN: [id number]  DOB/AGE: 84-21-1935 84 y.o.  Primary Care Physician:Shapely-Quinn, Okey Regal, MD  Admit date: 11/28/2019  Chief Complaint: No chief complaint on file.   S-Pt presented on  11/28/2019 with No chief complaint on file. .  Patient offers no new complaints. Patient has trach but patient does respond to direct questions and when asked is everything bothering him patient replies in no   meds  . chlorhexidine gluconate (MEDLINE KIT)  15 mL Mouth Rinse BID  . Chlorhexidine Gluconate Cloth  6 each Topical Q0600  . fentaNYL (SUBLIMAZE) injection  50 mcg Intravenous Once  . insulin aspart  0-6 Units Subcutaneous Q4H  . mouth rinse  15 mL Mouth Rinse 10 times per day         YQM:VHQION to get any data.   Physical Exam: Vital signs in last 24 hours: Temp:  [98.4 F (36.9 C)-98.8 F (37.1 C)] 98.6 F (37 C) (06/27 0600) Pulse Rate:  [31-146] 81 (06/27 0600) Resp:  [0-25] 20 (06/27 0600) BP: (80-164)/(43-95) 107/59 (06/27 0600) SpO2:  [95 %-100 %] 100 % (06/27 0600) FiO2 (%):  [30 %] 30 % (06/27 0743) Weight:  [72.1 kg] 72.1 kg (06/27 0329) Weight change: 0.9 kg Last BM Date: 11/28/19  Intake/Output from previous day: 06/26 0701 - 06/27 0700 In: 2917.5 [I.V.:2617.5; IV Piggyback:300] Out: 256  No intake/output data recorded.   Physical Exam: General- pt awake alert following commands resp-Trach in situ ,Rhonchi+ CVS- S1S2 regular in rate and rhythm GIT- BS+, soft, NT, ND EXT- NO LE Edema, no Cyanosis Access fistula  Lab Results: CBC Recent Labs    11/30/19 0415 12/01/19 0356  WBC 10.0 7.9  HGB 7.3* 7.4*  HCT 20.6* 20.1*  PLT 123* 132*    BMET Recent Labs    11/30/19 1558 12/01/19 0356  NA 137 137  K 3.3* 4.2  CL 95* 92*  CO2 31 33*  GLUCOSE 129* 205*  BUN 12 24*  CREATININE 1.98* 3.91*  CALCIUM 7.5* 7.4*    MICRO Recent Results (from the past 240 hour(s))  Culture, blood (routine x 2)     Status: None  (Preliminary result)   Collection Time: 11/28/19  1:31 PM   Specimen: BLOOD  Result Value Ref Range Status   Specimen Description BLOOD BLOOD RIGHT ARM  Final   Special Requests   Final    BOTTLES DRAWN AEROBIC AND ANAEROBIC Blood Culture adequate volume   Culture  Setup Time PENDING  Incomplete   Culture   Final    NO GROWTH 3 DAYS Performed at [facility], [address] [place], [place] [address]    Report Status PENDING  Incomplete  Culture, blood (routine x 2)     Status: None (Preliminary result)   Collection Time: 11/28/19  2:17 PM   Specimen: BLOOD  Result Value Ref Range Status   Specimen Description BLOOD BLOOD RIGHT WRIST  Final   Special Requests   Final    BOTTLES DRAWN AEROBIC AND ANAEROBIC Blood Culture adequate volume   Culture   Final    NO GROWTH 3 DAYS Performed at [facility], [address] [place], [place] [address]    Report Status PENDING  Incomplete  SARS Coronavirus 2 by RT PCR (hospital order, performed in [place] hospital lab) Nasopharyngeal Nasopharyngeal Swab     Status: None   Collection Time: 11/28/19  4:40 PM   Specimen: Nasopharyngeal Swab  Result Value Ref Range Status  SARS Coronavirus 2 NEGATIVE NEGATIVE Final    Comment: (NOTE) SARS-CoV-2 target nucleic acids are NOT DETECTED.  The SARS-CoV-2 RNA is generally detectable in upper and lower respiratory specimens during the acute phase of infection. The lowest concentration of SARS-CoV-2 viral copies this assay can detect is 250 copies / mL. A negative result does not preclude SARS-CoV-2 infection and should not be used as the sole basis for treatment or other patient management decisions.  A negative result may occur with improper specimen collection / handling, submission of specimen other than nasopharyngeal swab, presence of viral mutation(s) within the areas targeted by this assay, and inadequate number of viral copies (<250 copies / mL). A negative result  must be combined with clinical observations, patient history, and epidemiological information.  Fact Sheet for Patients:   StrictlyIdeas.no  Fact Sheet for Healthcare Providers: BankingDealers.co.za  This test is not yet approved or  cleared by the [place] FDA and has been authorized for detection and/or diagnosis of SARS-CoV-2 by FDA under an Emergency Use Authorization (EUA).  This EUA will remain in effect (meaning this test can be used) for the duration of the COVID-19 declaration under Section 564(b)(1) of the Act, 21 U.S.C. section 360bbb-3(b)(1), unless the authorization is terminated or revoked sooner.  Performed at [facility], [address] [place], [place] [address]   MRSA PCR Screening     Status: None   Collection Time: 11/28/19  9:08 PM   Specimen: Nasopharyngeal  Result Value Ref Range Status   MRSA by PCR NEGATIVE NEGATIVE Final    Comment:        The GeneXpert MRSA Assay (FDA approved for NASAL specimens only), is one component of a comprehensive MRSA colonization surveillance program. It is not intended to diagnose MRSA infection nor to guide or monitor treatment for MRSA infections. 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 now s/p laryngectomy with tracheostomy who was admitted to the hospital on June 24 after cardiac arrest  1)Renal end-stage renal disease Patient is on hemodialysis Patient is on Tuesday Thursday  Saturday schedule As outpatient patient undergoes dialysis under [facility] in [place] on Tuesday Thursday Saturday schedule being followed by [facility] nephrology Patient was last dialyzed yesterday  2) cardiac arrest Patient is s/p cardiac arrest Patient blood pressure is currently stable   3)Anemia of chronic disease  HGb is not at goal (9--11)   4) secondary hyperparathyroidism -CKD Mineral-Bone Disorder   Secondary Hyperparathyroidism present..  Phosphorus at goal.   5) Laryngeal carcinoma  Trach in situ Stable   6) electrolytes   sodium Normonatremic   potassium Hypokalemic Now better   7)Acid base Co2 at goal     Plan:  We will continue current treatment No need for renal placement therapy today   Shelby Anderle s Jenesys Casseus 12/01/2019, 8:15 AM

## 2019-12-01 NOTE — Progress Notes (Signed)
Patient appears more restless with increased agitation. Patient endorses having some pain and discomfort. Patient repositioned, however continues to grimace and shift in the bed and remains restless.  Increased, uneven respirations observed at this time and patient not answering when asked if he is struggling with his breathing.  Called RT to bedside to assess.  Discussed with Burman Nieves, NP.  New orders placed.  2300:  Patient resting comfortably with even respirations. Does not appear to be in any acute distress at this time.  Will continue to monitor.

## 2019-12-01 NOTE — Progress Notes (Signed)
Patient Name: Kerry [name] Date of Encounter: 12/01/2019  Hospital Problem List     Active Problems:   Cardiac arrest [facility])    Patient Profile     86 y.o.malewith history of86 y.o.male patient who has a history of diabetes, hypertension,laryngeal carcinoma s/p tracheostomy,end-stage kidney disease currently on hemodialysis, hyperlipidemia and sleep apnea who was admitted after suffering a cardiac arrest during hemodialysis. Was in vf per report in dialysis and had two more cardiac arrest episodes while in the er. He currently is in the code ice protocol and is on iv amiodarone, iv levophed. He has a fairly complicated past medical history including he has undergone placement of a tracheostomy due to laryngeal carcinoma and resultant laryngectomy. He also had a CT scan which showed coronary calcificationhowever his coronary anatomy has not been documented . Serum troponins was 61 on presentation and this peaked at 943.Had episode of afib wih rvr on HD yesterday. Received a bolus of iv amiodarone. Currently in nsr.   Subjective   More alert. Heart rate improved.   Inpatient Medications    . chlorhexidine gluconate (MEDLINE KIT)  15 mL Mouth Rinse BID  . Chlorhexidine Gluconate Cloth  6 each Topical Q0600  . fentaNYL (SUBLIMAZE) injection  50 mcg Intravenous Once  . insulin aspart  0-6 Units Subcutaneous Q4H  . mouth rinse  15 mL Mouth Rinse 10 times per day    Vital Signs    Vitals:   12/01/19 0329 12/01/19 0400 12/01/19 0500 12/01/19 0600  BP:  (!) 88/52 (!) 101/56 (!) 107/59  Pulse:  72 78 81  Resp:  20 20 20   Temp:  98.6 F (37 C)  98.6 F (37 C)  TempSrc:  Rectal  Rectal  SpO2:  100% 100% 100%  Weight: 72.1 kg     Height:        Intake/Output Summary (Last 24 hours) at 12/01/2019 0844 Last data filed at 12/01/2019 0400 Gross per 24 hour  Intake 2917.47 ml  Output 256 ml  Net 2661.47 ml   Filed Weights   11/29/19 0330 11/30/19 0326 12/01/19 0329  Weight:  67.2 kg 71.2 kg 72.1 kg    Physical Exam    GEN: Well nourished, well developed, in no acute distress.  HEENT: normal.  Neck: Supple, no JVD, carotid bruits, or masses. Cardiac: RRR, no murmurs, rubs, or gallops. No clubbing, cyanosis, edema.  Radials/DP/PT 2+ and equal bilaterally.  Respiratory:  Respirations regular and unlabored, clear to auscultation bilaterally. GI: Soft, nontender, nondistended, BS + x 4. MS: no deformity or atrophy. Skin: warm and dry, no rash. Neuro:  Strength and sensation are intact. Psych: Normal affect.  Labs    CBC Recent Labs    11/28/19 1331 11/28/19 1331 11/29/19 0610 11/29/19 0610 11/30/19 0415 12/01/19 0356  WBC 10.1   < > 20.0*   < > 10.0 7.9  NEUTROABS 6.9  --  18.6*  --   --   --   HGB 9.2*   < > 7.4*   < > 7.3* 7.4*  HCT 25.8*   < > 20.7*   < > 20.6* 20.1*  MCV 92.1   < > 90.4   < > 90.7 86.6  PLT 154   < > 139*   < > 123* 132*   < > = values in this interval not displayed.   Basic Metabolic Panel Recent Labs    11/29/19 0610 11/29/19 1810 11/30/19 0415 11/30/19 0934 11/30/19 1558 12/01/19 0356  NA 148*   < >   < >  --  137 137  K 4.7   < >   < >  --  3.3* 4.2  CL 103   < >   < >  --  95* 92*  CO2 25   < >   < >  --  31 33*  GLUCOSE 326*   < >   < >  --  129* 205*  BUN 34*   < >   < >  --  12 24*  CREATININE 4.97*   < >   < >  --  1.98* 3.91*  CALCIUM 8.0*   < >   < >  --  7.5* 7.4*  MG 2.2  --   --   --  1.8  --   PHOS 5.4*   < >  --  3.3  --  2.4*   < > = values in this interval not displayed.   Liver Function Tests Recent Labs    11/28/19 1331 11/28/19 1331 11/28/19 1638 11/28/19 1638 11/30/19 0415 12/01/19 0356  AST 343*  --  386*  --   --   --   ALT 300*  --  315*  --   --   --   ALKPHOS 65  --  51  --   --   --   BILITOT 1.5*  --  1.3*  --   --   --   PROT 5.8*  --  4.2*  --   --   --   ALBUMIN 3.3*   < > 2.3*   < > 2.8* 2.6*   < > = values in this interval not displayed.   No results for input(s):  LIPASE, AMYLASE in the last 72 hours. Cardiac Enzymes No results for input(s): CKTOTAL, CKMB, CKMBINDEX, TROPONINI in the last 72 hours. BNP Recent Labs    11/28/19 1331  BNP 4,339.6*   D-Dimer No results for input(s): DDIMER in the last 72 hours. Hemoglobin A1C No results for input(s): HGBA1C in the last 72 hours. Fasting Lipid Panel Recent Labs    11/29/19 0610  TRIG 69   Thyroid Function Tests No results for input(s): TSH, T4TOTAL, T3FREE, THYROIDAB in the last 72 hours.  Invalid input(s): FREET3  Telemetry    Sinus rhythm with pvcs.   ECG    Vf/vt/sinus tachy  Radiology    CT HEAD WO CONTRAST  Result Date: 11/28/2019 CLINICAL DATA:  Status post cardiac arrest. EXAM: CT HEAD WITHOUT CONTRAST TECHNIQUE: Contiguous axial images were obtained from the base of the skull through the vertex without intravenous contrast. COMPARISON:  None. FINDINGS: Brain: There is mild cerebral atrophy with widening of the extra-axial spaces and ventricular dilatation. There are areas of decreased attenuation within the white matter tracts of the supratentorial brain, consistent with microvascular disease changes. Vascular: No hyperdense vessel or unexpected calcification. Skull: Normal. Negative for fracture or focal lesion. Sinuses/Orbits: No acute finding. Other: None. IMPRESSION: 1. Generalized cerebral atrophy. 2. No acute intracranial abnormality. Electronically Signed   By: Virgina Norfolk M.D.   On: 11/28/2019 18:06   DG Chest Port 1 View  Result Date: 11/30/2019 CLINICAL DATA:  Acute respiratory failure EXAM: PORTABLE CHEST 1 VIEW COMPARISON:  11/28/2019 FINDINGS: Single frontal view of the chest demonstrates tracheostomy tube overlying tracheal air column tip at thoracic inlet. External defibrillator pads are again noted. Cardiac silhouette is unremarkable. Since the prior exam, there is decreased  interstitial prominence. Persistent central vascular congestion with trace right pleural  effusion. No pneumothorax. IMPRESSION: 1. Persistent central vascular congestion and trace right pleural effusion, with improving interstitial edema since prior exam. Electronically Signed   By: Randa Ngo M.D.   On: 11/30/2019 03:14   DG Chest Portable 1 View  Result Date: 11/28/2019 CLINICAL DATA:  Status post cardiac arrest during dialysis. EXAM: PORTABLE CHEST 1 VIEW COMPARISON:  Chest x-ray dated 07/19/2017 FINDINGS: Endotracheal tube in good position 4.7 cm above the carina. Heart size is normal. Slight bilateral haziness, left greater than right, likely represents mild edema. No consolidative infiltrates or effusions. No bone abnormality. IMPRESSION: 1. Endotracheal tube in good position. 2. Mild pulmonary edema. Electronically Signed   By: Lorriane Shire M.D.   On: 11/28/2019 14:33   EEG adult  Result Date: 11/29/2019 Alexis Goodell, MD     11/29/2019  7:02 PM ELECTROENCEPHALOGRAM REPORT Patient: Kerry [name]       Room #: IC17A-AA EEG No. ID: [id number] Age: 86 y.o.        Sex: male Requesting Physician: Kasa Report Date:  11/29/2019       Interpreting Physician: Alexis Goodell History: Arbie Blankley is an 86 y.o. male s/p arrest Medications: Propofol, Fentanyl, Levophed, Insulin Conditions of Recording:  This is a 21 channel routine scalp EEG performed with bipolar and monopolar montages arranged in accordance to the international 10/20 system of electrode placement. One channel was dedicated to EKG recording. The patient is in the intubated and sedated state. Description:  The background activity is slow and poorly organized.  It consists of a low voltage polymorphic delta activity that is continuous and diffusely distributed.  No epileptiform activity is noted.  There is no activation of the background rhythm noted with noxious stimuli.  Marland Kitchen  Hyperventilation and intermittent photic stimulation were not performed. IMPRESSION: This is an abnormal EEG secondary to general background slowing.  This  finding may be seen with a diffuse cerebral disturbance that is etiologically nonspecific, but may include a medication effect, among other possibilities.  No epileptiform activity was noted.  Background lacked reactivity.  Alexis Goodell, MD Neurology [phone] 11/29/2019, 6:58 PM   ECHOCARDIOGRAM COMPLETE  Result Date: 11/30/2019    ECHOCARDIOGRAM REPORT   Patient Name:   Kerry [name] Date of Exam: 11/30/2019 Medical Rec #:  [id number]     Height:       66.0 in Accession #:    [id number]    Weight:       157.0 lb Date of Birth:  1933-07-06     BSA:          1.804 m Patient Age:    75 years      BP:           114/55 mmHg Patient Gender: M             HR:           98 bpm. Exam Location:  [facility] Procedure: 2D Echo and Intracardiac Opacification Agent Indications:     Cardiac Arrest I46.9  History:         Patient has no prior history of Echocardiogram examinations.  Sonographer:     Kerry Go RDCS Referring Phys:  [id number] Awilda Bill Diagnosing Phys: Bartholome Bill MD  Sonographer Comments: Echo performed with patient supine and on artificial respirator and Technically difficult study due to poor echo windows. IMPRESSIONS  1. Left ventricular ejection fraction, by estimation, is 25 to 30%.  Left ventricular ejection fraction by PLAX is 26 %. The left ventricle has severely decreased function. The left ventricle has no regional wall motion abnormalities. The left ventricular internal cavity size was moderately dilated. Left ventricular diastolic parameters are consistent with Grade I diastolic dysfunction (impaired relaxation).  2. Right ventricular systolic function is normal. The right ventricular size is mildly enlarged.  3. The mitral valve was not well visualized. Trivial mitral valve regurgitation.  4. The aortic valve was not well visualized. Aortic valve regurgitation is trivial. Mild aortic valve sclerosis is present, with no evidence of aortic valve stenosis. FINDINGS  Left Ventricle: Left  ventricular ejection fraction, by estimation, is 25 to 30%. Left ventricular ejection fraction by PLAX is 26 %. The left ventricle has severely decreased function. The left ventricle has no regional wall motion abnormalities. Definity contrast agent was given IV to delineate the left ventricular endocardial borders. The left ventricular internal cavity size was moderately dilated. There is no left ventricular hypertrophy. Left ventricular diastolic parameters are consistent with Grade I diastolic dysfunction (impaired relaxation). Right Ventricle: The right ventricular size is mildly enlarged. No increase in right ventricular wall thickness. Right ventricular systolic function is normal. Left Atrium: Left atrial size was normal in size. Right Atrium: Right atrial size was normal in size. Pericardium: There is no evidence of pericardial effusion. Mitral Valve: The mitral valve was not well visualized. Trivial mitral valve regurgitation. Tricuspid Valve: The tricuspid valve is not well visualized. Tricuspid valve regurgitation is mild. Aortic Valve: The aortic valve was not well visualized. Aortic valve regurgitation is trivial. Mild aortic valve sclerosis is present, with no evidence of aortic valve stenosis. Aortic valve peak gradient measures 6.4 mmHg. Pulmonic Valve: The pulmonic valve was not well visualized. Pulmonic valve regurgitation is trivial. Aorta: The aortic root was not well visualized. IAS/Shunts: The interatrial septum was not assessed.  LEFT VENTRICLE PLAX 2D LV EF:         Left            Diastology                ventricular     LV e' lateral:   3.05 cm/s                ejection        LV E/e' lateral: 28.8                fraction by     LV e' medial:    5.00 cm/s                PLAX is 26      LV E/e' medial:  17.6                %. LVIDd:         4.98 cm LVIDs:         4.39 cm LV PW:         1.16 cm LV IVS:        1.11 cm LVOT diam:     2.00 cm LV SV:         43 LV SV Index:   24 LVOT Area:      3.14 cm  LV Volumes (MOD) LV vol d, MOD    182.0 ml A2C: LV vol d, MOD    159.0 ml A4C: LV vol s, MOD    136.0 ml A2C: LV vol s, MOD    126.0 ml A4C: LV  SV MOD A2C:   46.0 ml LV SV MOD A4C:   159.0 ml LV SV MOD BP:    38.5 ml RIGHT VENTRICLE RV Basal diam:  3.65 cm RV S prime:     14.00 cm/s TAPSE (M-mode): 2.1 cm LEFT ATRIUM              Index       RIGHT ATRIUM           Index LA diam:        3.40 cm  1.88 cm/m  RA Area:     16.40 cm LA Vol (A2C):   119.0 ml 65.96 ml/m RA Volume:   45.90 ml  25.44 ml/m LA Vol (A4C):   83.3 ml  46.17 ml/m LA Biplane Vol: 99.1 ml  54.93 ml/m  AORTIC VALVE                PULMONIC VALVE AV Area (Vmax): 2.07 cm    PV Vmax:       1.14 m/s AV Vmax:        126.00 cm/s PV Peak grad:  5.2 mmHg AV Peak Grad:   6.4 mmHg LVOT Vmax:      82.90 cm/s LVOT Vmean:     53.500 cm/s LVOT VTI:       0.137 m  AORTA Ao Root diam: 3.60 cm Ao Asc diam:  3.70 cm MITRAL VALVE MV Area (PHT): 4.15 cm     SHUNTS MV Decel Time: 183 msec     Systemic VTI:  0.14 m MV E velocity: 87.80 cm/s   Systemic Diam: 2.00 cm MV A velocity: 103.00 cm/s MV E/A ratio:  0.85 Bartholome Bill MD Electronically signed by Bartholome Bill MD Signature Date/Time: 11/30/2019/11:41:25 AM    Final     Assessment & Plan    86 year old male with history of end-stage renal disease on hemodialysis, history of laryngeal carcinoma status post tracheostomy, history of hyperlipidemia and sleep apnea who suffered a cardiac arrest during hemodialysis yesterday. Was noted to be in VF was cardioverted out of this. Had 2 more V. tach episodes in the ER. As of now his potassium was 2.7 when this started. He currently is in sinus rhythm intubated on a code ice protocol in the ICU on pressors.   1. Cardiac arrest-appears to be in a primary VF arrest. May have been exacerbated by low serum potassium at 2.7. Patient's cardiac markers, high-sensitivity troponin are mildly elevated. Not surprising there is elevation given ventricular  arrhythmia and resuscitation from this. Certainly coronary disease may be playing a role however not a candidate for invasive evaluation at present. Echo done today revealed ef of 25-30% which is not changed from his previous echo. We will continue to maintain potassium greater than 4 magnesium greater than 2. Currently on IV amiodarone. We will continue with this for now.Heparin discontinued due to bleeding and anemia. Will remain off of heparin.   2. End-stage renal disease-on hemodialysis. Continue nephrology evaluation and treatment.  3. Elevated high-sensitivity troponin-May be secondary to the arrest. Coronary disease is possible but given fairly blunted response, does not appear to be in a primary ischemic event. We will remain off of heparin or other anticoagulation/antiplatelet therapy due to bleeding and anemia.  Further invasive/noninvasive evaluation pending course.   4. Laryngeal carcinoma-stable. Has tracheostomy    Signed, Javier Docker. Jakori Burkett MD 12/01/2019, 8:44 AM  Pager: [phone]

## 2019-12-02 ENCOUNTER — Inpatient Hospital Stay: Payer: Medicare Other

## 2019-12-02 LAB — BLOOD GAS, ARTERIAL
Acid-Base Excess: 17.4 mmol/L — ABNORMAL HIGH (ref 0.0–2.0)
Acid-Base Excess: 17.9 mmol/L — ABNORMAL HIGH (ref 0.0–2.0)
Bicarbonate: 37.9 mmol/L — ABNORMAL HIGH (ref 20.0–28.0)
Bicarbonate: 38.8 mmol/L — ABNORMAL HIGH (ref 20.0–28.0)
FIO2: 0.3
FIO2: 0.3
MECHVT: 470 mL
MECHVT: 470 mL
Mechanical Rate: 16
Mechanical Rate: 20
O2 Saturation: 99.1 %
O2 Saturation: 99.6 %
PEEP: 5 cmH2O
PEEP: 5 cmH2O
Patient temperature: 37
Patient temperature: 37
RATE: 16 resp/min
RATE: 20 resp/min
pCO2 arterial: 28 mmHg — ABNORMAL LOW (ref 32.0–48.0)
pCO2 arterial: 30 mmHg — ABNORMAL LOW (ref 32.0–48.0)
pH, Arterial: 7.72 (ref 7.350–7.450)
pH, Arterial: 7.74 (ref 7.350–7.450)
pO2, Arterial: 100 mmHg (ref 83.0–108.0)
pO2, Arterial: 142 mmHg — ABNORMAL HIGH (ref 83.0–108.0)

## 2019-12-02 LAB — GLUCOSE, CAPILLARY
Glucose-Capillary: 103 mg/dL — ABNORMAL HIGH (ref 70–99)
Glucose-Capillary: 128 mg/dL — ABNORMAL HIGH (ref 70–99)
Glucose-Capillary: 137 mg/dL — ABNORMAL HIGH (ref 70–99)
Glucose-Capillary: 143 mg/dL — ABNORMAL HIGH (ref 70–99)
Glucose-Capillary: 162 mg/dL — ABNORMAL HIGH (ref 70–99)
Glucose-Capillary: 171 mg/dL — ABNORMAL HIGH (ref 70–99)

## 2019-12-02 LAB — CBC
HCT: 19.3 % — ABNORMAL LOW (ref 39.0–52.0)
Hemoglobin: 7.2 g/dL — ABNORMAL LOW (ref 13.0–17.0)
MCH: 32.3 pg (ref 26.0–34.0)
MCHC: 37.3 g/dL — ABNORMAL HIGH (ref 30.0–36.0)
MCV: 86.5 fL (ref 80.0–100.0)
Platelets: 157 10*3/uL (ref 150–400)
RBC: 2.23 MIL/uL — ABNORMAL LOW (ref 4.22–5.81)
RDW: 16.2 % — ABNORMAL HIGH (ref 11.5–15.5)
WBC: 10 10*3/uL (ref 4.0–10.5)
nRBC: 0.2 % (ref 0.0–0.2)

## 2019-12-02 LAB — RENAL FUNCTION PANEL
Albumin: 2.7 g/dL — ABNORMAL LOW (ref 3.5–5.0)
Anion gap: 15 (ref 5–15)
BUN: 35 mg/dL — ABNORMAL HIGH (ref 8–23)
CO2: 34 mmol/L — ABNORMAL HIGH (ref 22–32)
Calcium: 7.9 mg/dL — ABNORMAL LOW (ref 8.9–10.3)
Chloride: 89 mmol/L — ABNORMAL LOW (ref 98–111)
Creatinine, Ser: 5.14 mg/dL — ABNORMAL HIGH (ref 0.61–1.24)
GFR calc Af Amer: 11 mL/min — ABNORMAL LOW (ref >60)
GFR calc non Af Amer: 10 mL/min — ABNORMAL LOW (ref >60)
Glucose, Bld: 165 mg/dL — ABNORMAL HIGH (ref 70–99)
Phosphorus: 2.9 mg/dL (ref 2.5–4.6)
Potassium: 4.1 mmol/L (ref 3.5–5.1)
Sodium: 138 mmol/L (ref 135–145)

## 2019-12-02 LAB — MAGNESIUM: Magnesium: 1.7 mg/dL (ref 1.7–2.4)

## 2019-12-02 MED ORDER — BISACODYL 10 MG RE SUPP
10.0000 mg | Freq: Once | RECTAL | Status: AC
  Administered 2019-12-02: 10 mg via RECTAL
  Filled 2019-12-02: qty 1

## 2019-12-02 NOTE — Progress Notes (Signed)
[facility]  ROUNDING NOTE   Subjective:   Wife at bedside. Patient able to answer questions with headnods.   Currently on trach collar trial.   Objective:  Vital signs in last 24 hours:  Temp:  [98.4 F (36.9 C)-98.8 F (37.1 C)] 98.7 F (37.1 C) (06/28 0730) Pulse Rate:  [72-88] 85 (06/28 0900) Resp:  [14-28] 21 (06/28 0900) BP: (97-127)/(54-70) 116/62 (06/28 0900) SpO2:  [92 %-100 %] 94 % (06/28 0900) FiO2 (%):  [28 %-30 %] 28 % (06/28 0805) Weight:  [74.1 kg] 74.1 kg (06/28 0500)  Weight change: 2 kg Filed Weights   11/30/19 0326 12/01/19 0329 12/02/19 0500  Weight: 71.2 kg 72.1 kg 74.1 kg    Intake/Output: I/O last 3 completed shifts: In: 2741.9 [I.V.:2441.9; IV [id number]:237] Out: -    Intake/Output this shift:  No intake/output data recorded.  Physical Exam: General: Critically ill  Head: Normocephalic, atraumatic. Moist oral mucosal membranes  Eyes: Anicteric, PERRL  Neck: +trach  Lungs:  PRVC FiO2 30%  Heart: Regular rate and rhythm  Abdomen:  Soft, nontender  Extremities: no peripheral edema.  Neurologic: Nonfocal, moving all four extremities  Skin: No lesions  Access: Left AVG    Basic Metabolic Panel: Recent Labs  Lab 11/28/19 1919 11/28/19 1919 11/29/19 0610 11/29/19 0610 11/29/19 1810 11/29/19 1810 11/30/19 0415 11/30/19 0415 11/30/19 0934 11/30/19 1558 12/01/19 0356 12/02/19 0526  NA 145   < > 148*   < > 144  --  143  --   --  137 137 138  K 3.8   < > 4.7   < > 3.9  --  3.4*  --   --  3.3* 4.2 4.1  CL 105   < > 103   < > 99  --  98  --   --  95* 92* 89*  CO2 19*   < > 25   < > 31  --  33*  --   --  31 33* 34*  GLUCOSE 184*   < > 326*   < > 281*  --  168*  --   --  129* 205* 165*  BUN 28*   < > 34*   < > 38*  --  39*  --   --  12 24* 35*  CREATININE 4.68*   < > 4.97*   < > 5.57*  --  5.89*  --   --  1.98* 3.91* 5.14*  CALCIUM 8.2*   < > 8.0*   < > 7.6*   < > 7.4*   < >  --  7.5* 7.4* 7.9*  MG 2.4  --  2.2  --   --    --   --   --   --  1.8  --  1.7  PHOS 4.7*   < > 5.4*  --   --   --  3.2  --  3.3  --  2.4* 2.9   < > = values in this interval not displayed.    Liver Function Tests: Recent Labs  Lab 11/28/19 1331 11/28/19 1638 11/30/19 0415 12/01/19 0356 12/02/19 0526  AST 343* 386*  --   --   --   ALT 300* 315*  --   --   --   ALKPHOS 65 51  --   --   --   BILITOT 1.5* 1.3*  --   --   --   PROT 5.8* 4.2*  --   --   --  ALBUMIN 3.3* 2.3* 2.8* 2.6* 2.7*   No results for input(s): LIPASE, AMYLASE in the last 168 hours. No results for input(s): AMMONIA in the last 168 hours.  CBC: Recent Labs  Lab 11/28/19 1331 11/29/19 0610 11/30/19 0415 12/01/19 0356 12/02/19 0526  WBC 10.1 20.0* 10.0 7.9 10.0  NEUTROABS 6.9 18.6*  --   --   --   HGB 9.2* 7.4* 7.3* 7.4* 7.2*  HCT 25.8* 20.7* 20.6* 20.1* 19.3*  MCV 92.1 90.4 90.7 86.6 86.5  PLT 154 139* 123* 132* 157    Cardiac Enzymes: No results for input(s): CKTOTAL, CKMB, CKMBINDEX, TROPONINI in the last 168 hours.  BNP: Invalid input(s): POCBNP  CBG: Recent Labs  Lab 12/01/19 1740 12/01/19 1952 12/02/19 0008 12/02/19 0426 12/02/19 0753  GLUCAP 135* 120* 103* 128* 5*    Microbiology: Results for orders placed or performed during the hospital encounter of 11/28/19  Culture, blood (routine x 2)     Status: None (Preliminary result)   Collection Time: 11/28/19  1:31 PM   Specimen: BLOOD  Result Value Ref Range Status   Specimen Description BLOOD BLOOD RIGHT ARM  Final   Special Requests   Final    BOTTLES DRAWN AEROBIC AND ANAEROBIC Blood Culture adequate volume   Culture   Final    NO GROWTH 4 DAYS Performed at [facility], [address], [place], [place] [address]    Report Status PENDING  Incomplete  Culture, blood (routine x 2)     Status: None (Preliminary result)   Collection Time: 11/28/19  2:17 PM   Specimen: BLOOD  Result Value Ref Range Status   Specimen Description BLOOD BLOOD RIGHT WRIST  Final    Special Requests   Final    BOTTLES DRAWN AEROBIC AND ANAEROBIC Blood Culture adequate volume   Culture   Final    NO GROWTH 4 DAYS Performed at [facility], [address], [place], [place] [address]    Report Status PENDING  Incomplete  SARS Coronavirus 2 by RT PCR (hospital order, performed in [place] hospital lab) Nasopharyngeal Nasopharyngeal Swab     Status: None   Collection Time: 11/28/19  4:40 PM   Specimen: Nasopharyngeal Swab  Result Value Ref Range Status   SARS Coronavirus 2 NEGATIVE NEGATIVE Final    Comment: (NOTE) SARS-CoV-2 target nucleic acids are NOT DETECTED.  The SARS-CoV-2 RNA is generally detectable in upper and lower respiratory specimens during the acute phase of infection. The lowest concentration of SARS-CoV-2 viral copies this assay can detect is 250 copies / mL. A negative result does not preclude SARS-CoV-2 infection and should not be used as the sole basis for treatment or other patient management decisions.  A negative result may occur with improper specimen collection / handling, submission of specimen other than nasopharyngeal swab, presence of viral mutation(s) within the areas targeted by this assay, and inadequate number of viral copies (<250 copies / mL). A negative result must be combined with clinical observations, patient history, and epidemiological information.  Fact Sheet for Patients:   StrictlyIdeas.no  Fact Sheet for Healthcare Providers: BankingDealers.co.za  This test is not yet approved or  cleared by the [place] FDA and has been authorized for detection and/or diagnosis of SARS-CoV-2 by FDA under an Emergency Use Authorization (EUA).  This EUA will remain in effect (meaning this test can be used) for the duration of the COVID-19 declaration under Section 564(b)(1) of the Act, 21 U.S.C. section 360bbb-3(b)(1), unless the  authorization is terminated or revoked  sooner.  Performed at [facility], [address] [place], [place] [address]   MRSA PCR Screening     Status: None   Collection Time: 11/28/19  9:08 PM   Specimen: Nasopharyngeal  Result Value Ref Range Status   MRSA by PCR NEGATIVE NEGATIVE Final    Comment:        The GeneXpert MRSA Assay (FDA approved for NASAL specimens only), is one component of a comprehensive MRSA colonization surveillance program. It is not intended to diagnose MRSA infection nor to guide or monitor treatment for MRSA infections. Performed at [facility], [address] [place],  [address]     Coagulation Studies: No results for input(s): LABPROT, INR in the last 72 hours.  Urinalysis: No results for input(s): COLORURINE, LABSPEC, PHURINE, GLUCOSEU, HGBUR, BILIRUBINUR, KETONESUR, PROTEINUR, UROBILINOGEN, NITRITE, LEUKOCYTESUR in the last 72 hours.  Invalid input(s): APPERANCEUR    Imaging: DG Chest Port 1 View  Result Date: 12/02/2019 CLINICAL DATA:  Acute respiratory failure. EXAM: PORTABLE CHEST 1 VIEW COMPARISON:  11/30/2019 FINDINGS: The tracheostomy tube is stable. External pacer paddles are again noted. Stable mild cardiac enlargement and persistent lower lobe predominant airspace process and bilateral pleural effusions. IMPRESSION: Persistent lower lobe predominant airspace process and bilateral pleural effusions. Electronically Signed   By: Marijo Sanes M.D.   On: 12/02/2019 07:23   ECHOCARDIOGRAM COMPLETE  Result Date: 11/30/2019    ECHOCARDIOGRAM REPORT   Patient Name:   [facility] Date of Exam: 11/30/2019 Medical Rec #:  [id number]     Height:       66.0 in Accession #:    [id number]    Weight:       157.0 lb Date of Birth:  Jun 03, 1934     BSA:          1.804 m Patient Age:    21 years      BP:           114/55 mmHg Patient Gender: M             HR:           98 bpm. Exam Location:  [facility] Procedure: 2D Echo and Intracardiac Opacification Agent Indications:      Cardiac Arrest I46.9  History:         Patient has no prior history of Echocardiogram examinations.  Sonographer:     Arville Go RDCS Referring Phys:  [id number] Awilda Bill Diagnosing Phys: Bartholome Bill MD  Sonographer Comments: Echo performed with patient supine and on artificial respirator and Technically difficult study due to poor echo windows. IMPRESSIONS  1. Left ventricular ejection fraction, by estimation, is 25 to 30%. Left ventricular ejection fraction by PLAX is 26 %. The left ventricle has severely decreased function. The left ventricle has no regional wall motion abnormalities. The left ventricular internal cavity size was moderately dilated. Left ventricular diastolic parameters are consistent with Grade I diastolic dysfunction (impaired relaxation).  2. Right ventricular systolic function is normal. The right ventricular size is mildly enlarged.  3. The mitral valve was not well visualized. Trivial mitral valve regurgitation.  4. The aortic valve was not well visualized. Aortic valve regurgitation is trivial. Mild aortic valve sclerosis is present, with no evidence of aortic valve stenosis. FINDINGS  Left Ventricle: Left ventricular ejection fraction, by estimation, is 25 to 30%. Left ventricular ejection fraction by PLAX is 26 %. The left ventricle has severely decreased function. The  left ventricle has no regional wall motion abnormalities. Definity contrast agent was given IV to delineate the left ventricular endocardial borders. The left ventricular internal cavity size was moderately dilated. There is no left ventricular hypertrophy. Left ventricular diastolic parameters are consistent with Grade I diastolic dysfunction (impaired relaxation). Right Ventricle: The right ventricular size is mildly enlarged. No increase in right ventricular wall thickness. Right ventricular systolic function is normal. Left Atrium: Left atrial size was normal in size. Right Atrium: Right atrial size was  normal in size. Pericardium: There is no evidence of pericardial effusion. Mitral Valve: The mitral valve was not well visualized. Trivial mitral valve regurgitation. Tricuspid Valve: The tricuspid valve is not well visualized. Tricuspid valve regurgitation is mild. Aortic Valve: The aortic valve was not well visualized. Aortic valve regurgitation is trivial. Mild aortic valve sclerosis is present, with no evidence of aortic valve stenosis. Aortic valve peak gradient measures 6.4 mmHg. Pulmonic Valve: The pulmonic valve was not well visualized. Pulmonic valve regurgitation is trivial. Aorta: The aortic root was not well visualized. IAS/Shunts: The interatrial septum was not assessed.  LEFT VENTRICLE PLAX 2D LV EF:         Left            Diastology                ventricular     LV e' lateral:   3.05 cm/s                ejection        LV E/e' lateral: 28.8                fraction by     LV e' medial:    5.00 cm/s                PLAX is 26      LV E/e' medial:  17.6                %. LVIDd:         4.98 cm LVIDs:         4.39 cm LV PW:         1.16 cm LV IVS:        1.11 cm LVOT diam:     2.00 cm LV SV:         43 LV SV Index:   24 LVOT Area:     3.14 cm  LV Volumes (MOD) LV vol d, MOD    182.0 ml A2C: LV vol d, MOD    159.0 ml A4C: LV vol s, MOD    136.0 ml A2C: LV vol s, MOD    126.0 ml A4C: LV SV MOD A2C:   46.0 ml LV SV MOD A4C:   159.0 ml LV SV MOD BP:    38.5 ml RIGHT VENTRICLE RV Basal diam:  3.65 cm RV S prime:     14.00 cm/s TAPSE (M-mode): 2.1 cm LEFT ATRIUM              Index       RIGHT ATRIUM           Index LA diam:        3.40 cm  1.88 cm/m  RA Area:     16.40 cm LA Vol (A2C):   119.0 ml 65.96 ml/m RA Volume:   45.90 ml  25.44 ml/m LA Vol (A4C):   83.3 ml  46.17 ml/m LA Biplane Vol:  99.1 ml  54.93 ml/m  AORTIC VALVE                PULMONIC VALVE AV Area (Vmax): 2.07 cm    PV Vmax:       1.14 m/s AV Vmax:        126.00 cm/s PV Peak grad:  5.2 mmHg AV Peak Grad:   6.4 mmHg LVOT Vmax:      82.90  cm/s LVOT Vmean:     53.500 cm/s LVOT VTI:       0.137 m  AORTA Ao Root diam: 3.60 cm Ao Asc diam:  3.70 cm MITRAL VALVE MV Area (PHT): 4.15 cm     SHUNTS MV Decel Time: 183 msec     Systemic VTI:  0.14 m MV E velocity: 87.80 cm/s   Systemic Diam: 2.00 cm MV A velocity: 103.00 cm/s MV E/A ratio:  0.85 Bartholome Bill MD Electronically signed by Bartholome Bill MD Signature Date/Time: 11/30/2019/11:41:25 AM    Final      Medications:   . sodium chloride    . sodium chloride Stopped (11/28/19 2021)  . sodium chloride    . sodium chloride    . amiodarone 30 mg/hr (12/02/19 0600)  . dexmedetomidine (PRECEDEX) IV infusion Stopped (11/30/19 1630)  . famotidine (PEPCID) IV 20 mg (12/02/19 0854)  . norepinephrine (LEVOPHED) Adult infusion 3 mcg/min (12/02/19 0600)  . propofol (DIPRIVAN) infusion Stopped (11/30/19 0722)   . bisacodyl  10 mg Rectal Once  . chlorhexidine gluconate (MEDLINE KIT)  15 mL Mouth Rinse BID  . Chlorhexidine Gluconate Cloth  6 each Topical Q0600  . insulin aspart  0-6 Units Subcutaneous Q4H  . mouth rinse  15 mL Mouth Rinse 10 times per day  . midazolam  2 mg Intravenous Once  . polyethylene glycol  17 g Oral Daily   sodium chloride, sodium chloride, alteplase, bisacodyl, fentaNYL (SUBLIMAZE) injection, heparin, lidocaine (PF), lidocaine-prilocaine, midazolam, midazolam, pentafluoroprop-tetrafluoroeth, polyethylene glycol  Assessment/ Plan:  Mr. Jamall Strohmeier is a 86 y.o. 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 End-stage renal disease:  dialysis for tomorrow. Monitor potassium  2. Hypokalemia: has a history of hyperkalemia. Unclear cause of hypokalemia.  Consider restarting ARB/ACE-I to help increase serum potassium levels.   3. Hypotension: with recent cardiac arrest. Holding home regimen of amlodipnie, hydralazine, isosorbide mononitrate, torsemide, and metoprolol.  Currently requiring norepinephrine.   4. Anemia with chronic kidney disease: hemoglobin 7.2. receives Mircera as outpatient.  - EPO with next HD treatment.     LOS: 4 Saraann Enneking 6/28/202110:04 AM

## 2019-12-02 NOTE — Progress Notes (Signed)
Patient Name: Kerry [name] Date of Encounter: 12/02/2019  Hospital Problem List     Active Problems:   Cardiac arrest [facility])    Patient Profile     86 y.o.malewith history of86 y.o.male patient who has a history of diabetes, hypertension,laryngeal carcinoma s/p tracheostomy,end-stage kidney disease currently on hemodialysis, hyperlipidemia and sleep apnea who was admitted after suffering a cardiac arrest during hemodialysis. Was in vf per report in dialysis and had two more cardiac arrest episodes while in the er. He currently is in the code ice protocol and is on iv amiodarone, iv levophed. He has a fairly complicated past medical history including he has undergone placement of a tracheostomy due to laryngeal carcinoma and resultant laryngectomy. He also had a CT scan which showed coronary calcificationhowever his coronary anatomy has not been documented .   Subjective   Somewhat more alert this morning.  Still on low-dose Levophed.  Remains in sinus rhythm.  Denies discomfort.  Inpatient Medications    . chlorhexidine gluconate (MEDLINE KIT)  15 mL Mouth Rinse BID  . Chlorhexidine Gluconate Cloth  6 each Topical Q0600  . insulin aspart  0-6 Units Subcutaneous Q4H  . mouth rinse  15 mL Mouth Rinse 10 times per day  . midazolam  2 mg Intravenous Once  . polyethylene glycol  17 g Oral Daily    Vital Signs    Vitals:   12/02/19 0430 12/02/19 0500 12/02/19 0530 12/02/19 0600  BP: 127/68 117/67 117/64 (!) 101/58  Pulse: 79 83 83 85  Resp: 19 16 (!) 24 17  Temp:      TempSrc:      SpO2: 99% 98% 99% 99%  Weight:  74.1 kg    Height:        Intake/Output Summary (Last 24 hours) at 12/02/2019 0719 Last data filed at 12/02/2019 0600 Gross per 24 hour  Intake 1486.28 ml  Output --  Net 1486.28 ml   Filed Weights   11/30/19 0326 12/01/19 0329 12/02/19 0500  Weight: 71.2 kg 72.1 kg 74.1 kg    Physical Exam    GEN: Chronically ill-appearing African-American male with  trach.  No acute distress.Marland Kitchen  HEENT: normal.  Neck: Tracheostomy in place. Cardiac: RRR, 1/6 systolic murmur Respiratory:  Respirations regular with no rales.  Bilateral rhonchi. GI: Soft, nontender, nondistended, BS + x 4. MS: no deformity or atrophy. Skin: warm and dry, no rash. Neuro:  Strength and sensation are intact. Psych: Normal affect.  Labs    CBC Recent Labs    12/01/19 0356 12/02/19 0526  WBC 7.9 10.0  HGB 7.4* 7.2*  HCT 20.1* 19.3*  MCV 86.6 86.5  PLT 132* 325   Basic Metabolic Panel Recent Labs    11/30/19 1558 11/30/19 1558 12/01/19 0356 12/02/19 0526  NA 137   < > 137 138  K 3.3*   < > 4.2 4.1  CL 95*   < > 92* 89*  CO2 31   < > 33* 34*  GLUCOSE 129*   < > 205* 165*  BUN 12   < > 24* 35*  CREATININE 1.98*   < > 3.91* 5.14*  CALCIUM 7.5*   < > 7.4* 7.9*  MG 1.8  --   --  1.7  PHOS  --   --  2.4* 2.9   < > = values in this interval not displayed.   Liver Function Tests Recent Labs    12/01/19 0356 12/02/19 0526  ALBUMIN 2.6* 2.7*  No results for input(s): LIPASE, AMYLASE in the last 72 hours. Cardiac Enzymes No results for input(s): CKTOTAL, CKMB, CKMBINDEX, TROPONINI in the last 72 hours. BNP No results for input(s): BNP in the last 72 hours. D-Dimer No results for input(s): DDIMER in the last 72 hours. Hemoglobin A1C No results for input(s): HGBA1C in the last 72 hours. Fasting Lipid Panel No results for input(s): CHOL, HDL, LDLCALC, TRIG, CHOLHDL, LDLDIRECT in the last 72 hours. Thyroid Function Tests No results for input(s): TSH, T4TOTAL, T3FREE, THYROIDAB in the last 72 hours.  Invalid input(s): FREET3  Telemetry    Atrial fibrillation with intermittent ventricular runs.  These are nonsustained.  ECG       Radiology    CT HEAD WO CONTRAST  Result Date: 11/28/2019 CLINICAL DATA:  Status post cardiac arrest. EXAM: CT HEAD WITHOUT CONTRAST TECHNIQUE: Contiguous axial images were obtained from the base of the skull through  the vertex without intravenous contrast. COMPARISON:  None. FINDINGS: Brain: There is mild cerebral atrophy with widening of the extra-axial spaces and ventricular dilatation. There are areas of decreased attenuation within the white matter tracts of the supratentorial brain, consistent with microvascular disease changes. Vascular: No hyperdense vessel or unexpected calcification. Skull: Normal. Negative for fracture or focal lesion. Sinuses/Orbits: No acute finding. Other: None. IMPRESSION: 1. Generalized cerebral atrophy. 2. No acute intracranial abnormality. Electronically Signed   By: Virgina Norfolk M.D.   On: 11/28/2019 18:06   DG Chest Port 1 View  Result Date: 11/30/2019 CLINICAL DATA:  Acute respiratory failure EXAM: PORTABLE CHEST 1 VIEW COMPARISON:  11/28/2019 FINDINGS: Single frontal view of the chest demonstrates tracheostomy tube overlying tracheal air column tip at thoracic inlet. External defibrillator pads are again noted. Cardiac silhouette is unremarkable. Since the prior exam, there is decreased interstitial prominence. Persistent central vascular congestion with trace right pleural effusion. No pneumothorax. IMPRESSION: 1. Persistent central vascular congestion and trace right pleural effusion, with improving interstitial edema since prior exam. Electronically Signed   By: Randa Ngo M.D.   On: 11/30/2019 03:14   DG Chest Portable 1 View  Result Date: 11/28/2019 CLINICAL DATA:  Status post cardiac arrest during dialysis. EXAM: PORTABLE CHEST 1 VIEW COMPARISON:  Chest x-ray dated 07/19/2017 FINDINGS: Endotracheal tube in good position 4.7 cm above the carina. Heart size is normal. Slight bilateral haziness, left greater than right, likely represents mild edema. No consolidative infiltrates or effusions. No bone abnormality. IMPRESSION: 1. Endotracheal tube in good position. 2. Mild pulmonary edema. Electronically Signed   By: Lorriane Shire M.D.   On: 11/28/2019 14:33   EEG  adult  Result Date: 11/29/2019 Kerry Goodell, MD     11/29/2019  7:02 PM ELECTROENCEPHALOGRAM REPORT Patient: Kerry [name]       Room #: IC17A-AA EEG No. ID: [id number] Age: 86 y.o.        Sex: male Requesting Physician: Kerry [name] Report Date:  11/29/2019       Interpreting Physician: Kerry [name] History: Aaliyah Cancro is an 86 y.o. male s/p arrest Medications: Propofol, Fentanyl, Levophed, Insulin Conditions of Recording:  This is a 21 channel routine scalp EEG performed with bipolar and monopolar montages arranged in accordance to the international 10/20 system of electrode placement. One channel was dedicated to EKG recording. The patient is in the intubated and sedated state. Description:  The background activity is slow and poorly organized.  It consists of a low voltage polymorphic delta activity that is continuous and diffusely distributed.  No epileptiform activity  is noted.  There is no activation of the background rhythm noted with noxious stimuli.  Marland Kitchen  Hyperventilation and intermittent photic stimulation were not performed. IMPRESSION: This is an abnormal EEG secondary to general background slowing.  This finding may be seen with a diffuse cerebral disturbance that is etiologically nonspecific, but may include a medication effect, among other possibilities.  No epileptiform activity was noted.  Background lacked reactivity.  Kerry Goodell, MD Neurology [phone] 11/29/2019, 6:58 PM   ECHOCARDIOGRAM COMPLETE  Result Date: 11/30/2019    ECHOCARDIOGRAM REPORT   Patient Name:   [facility]ele Date of Exam: 11/30/2019 Medical Rec #:  [id number]     Height:       66.0 in Accession #:    [id number]    Weight:       157.0 lb Date of Birth:  01-17-1934     BSA:          1.804 m Patient Age:    55 years      BP:           114/55 mmHg Patient Gender: M             HR:           98 bpm. Exam Location:  [facility] Procedure: 2D Echo and Intracardiac Opacification Agent Indications:     Cardiac Arrest I46.9  History:          Patient has no prior history of Echocardiogram examinations.  Sonographer:     Arville Go RDCS Referring Phys:  [id number] Awilda Bill Diagnosing Phys: Bartholome Bill MD  Sonographer Comments: Echo performed with patient supine and on artificial respirator and Technically difficult study due to poor echo windows. IMPRESSIONS  1. Left ventricular ejection fraction, by estimation, is 25 to 30%. Left ventricular ejection fraction by PLAX is 26 %. The left ventricle has severely decreased function. The left ventricle has no regional wall motion abnormalities. The left ventricular internal cavity size was moderately dilated. Left ventricular diastolic parameters are consistent with Grade I diastolic dysfunction (impaired relaxation).  2. Right ventricular systolic function is normal. The right ventricular size is mildly enlarged.  3. The mitral valve was not well visualized. Trivial mitral valve regurgitation.  4. The aortic valve was not well visualized. Aortic valve regurgitation is trivial. Mild aortic valve sclerosis is present, with no evidence of aortic valve stenosis. FINDINGS  Left Ventricle: Left ventricular ejection fraction, by estimation, is 25 to 30%. Left ventricular ejection fraction by PLAX is 26 %. The left ventricle has severely decreased function. The left ventricle has no regional wall motion abnormalities. Definity contrast agent was given IV to delineate the left ventricular endocardial borders. The left ventricular internal cavity size was moderately dilated. There is no left ventricular hypertrophy. Left ventricular diastolic parameters are consistent with Grade I diastolic dysfunction (impaired relaxation). Right Ventricle: The right ventricular size is mildly enlarged. No increase in right ventricular wall thickness. Right ventricular systolic function is normal. Left Atrium: Left atrial size was normal in size. Right Atrium: Right atrial size was normal in size. Pericardium: There is no  evidence of pericardial effusion. Mitral Valve: The mitral valve was not well visualized. Trivial mitral valve regurgitation. Tricuspid Valve: The tricuspid valve is not well visualized. Tricuspid valve regurgitation is mild. Aortic Valve: The aortic valve was not well visualized. Aortic valve regurgitation is trivial. Mild aortic valve sclerosis is present, with no evidence of aortic valve stenosis. Aortic valve peak gradient measures 6.4 mmHg.  Pulmonic Valve: The pulmonic valve was not well visualized. Pulmonic valve regurgitation is trivial. Aorta: The aortic root was not well visualized. IAS/Shunts: The interatrial septum was not assessed.  LEFT VENTRICLE PLAX 2D LV EF:         Left            Diastology                ventricular     LV e' lateral:   3.05 cm/s                ejection        LV E/e' lateral: 28.8                fraction by     LV e' medial:    5.00 cm/s                PLAX is 26      LV E/e' medial:  17.6                %. LVIDd:         4.98 cm LVIDs:         4.39 cm LV PW:         1.16 cm LV IVS:        1.11 cm LVOT diam:     2.00 cm LV SV:         43 LV SV Index:   24 LVOT Area:     3.14 cm  LV Volumes (MOD) LV vol d, MOD    182.0 ml A2C: LV vol d, MOD    159.0 ml A4C: LV vol s, MOD    136.0 ml A2C: LV vol s, MOD    126.0 ml A4C: LV SV MOD A2C:   46.0 ml LV SV MOD A4C:   159.0 ml LV SV MOD BP:    38.5 ml RIGHT VENTRICLE RV Basal diam:  3.65 cm RV S prime:     14.00 cm/s TAPSE (M-mode): 2.1 cm LEFT ATRIUM              Index       RIGHT ATRIUM           Index LA diam:        3.40 cm  1.88 cm/m  RA Area:     16.40 cm LA Vol (A2C):   119.0 ml 65.96 ml/m RA Volume:   45.90 ml  25.44 ml/m LA Vol (A4C):   83.3 ml  46.17 ml/m LA Biplane Vol: 99.1 ml  54.93 ml/m  AORTIC VALVE                PULMONIC VALVE AV Area (Vmax): 2.07 cm    PV Vmax:       1.14 m/s AV Vmax:        126.00 cm/s PV Peak grad:  5.2 mmHg AV Peak Grad:   6.4 mmHg LVOT Vmax:      82.90 cm/s LVOT Vmean:     53.500 cm/s LVOT  VTI:       0.137 m  AORTA Ao Root diam: 3.60 cm Ao Asc diam:  3.70 cm MITRAL VALVE MV Area (PHT): 4.15 cm     SHUNTS MV Decel Time: 183 msec     Systemic VTI:  0.14 m MV E velocity: 87.80 cm/s   Systemic Diam: 2.00 cm MV A velocity: 103.00 cm/s MV E/A ratio:  0.85 Bartholome Bill MD Electronically  signed by Bartholome Bill MD Signature Date/Time: 11/30/2019/11:41:25 AM    Final     Assessment & Plan    86 year old male with history of end-stage renal disease on hemodialysis, history of laryngeal carcinoma status post tracheostomy, history of hyperlipidemia and sleep apnea who suffered a cardiac arrest during hemodialysis yesterday. Was noted to be in VF was cardioverted out of this. Had 2 more V. tach episodes in the ER. As of now his potassium was 2.7 when this started.   1. Cardiac arrest-appears to be in a primary VF arrest. May have been exacerbated by low serum potassium at 2.7. Patient's cardiac markers, high-sensitivity troponin are mildly elevated. Not surprising there is elevation given ventricular arrhythmia and resuscitation from this. Certainly coronary disease may be playing a role however not a candidate for invasive evaluation at present.Echo done today revealed ef of 25-30% which is not changed from his previous echo.We will continue to maintain potassium greater than 4 magnesium greater than 2. Currently on IV amiodarone. We will continue with this for now and convert to p.o. amiodarone when more stable.Heparin discontinued due to bleeding and anemia. Will remain off of heparin. Will remain off anticoagulation for now as well due to bleeding previously and anemia.  Was on Plavix empirically for his coronary disease.  We will also remain off of this for now.  Heparin continues to drift down with a hemoglobin of 7.2 down from 7.4 yesterday.  2. End-stage renal disease-on hemodialysis. Continue nephrology evaluation and treatment.  Creatinine up to 5.14 today.  Potassium  4.1.  3. Elevated high-sensitivity troponin-May be secondary to the arrest. Coronary disease is possible but given fairly blunted response, does not appear to be in a primary ischemic event. Wewill remain off of heparin or other anticoagulation/antiplatelet therapy due to bleeding and anemia.Further invasive/noninvasive evaluation pending course. Echo revealed no appreciable change in wall motion or ejection fraction from baseline.  4. Laryngeal carcinoma-stable. Has tracheostomy  5.  Altered mental status-slowly improving.  More animated this morning.  We will continue to follow.  Signed, Javier Docker Treson Laura MD 12/02/2019, 7:19 AM  Pager: [phone]

## 2019-12-02 NOTE — Progress Notes (Signed)
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   Apparently patient lost pulse while having his the most recent dialysis session. Patient had also developed A. fib RVR and was apneic in the field and required bag mask ventilation. After arrival to the ER patient again coded x2 with V. fib arrest status post ACLS and ROSC, found to be amiodarone and 20 MCG Levophed.wifeat bedside.  Lines / Drains: Left upper extremity HD access, I/O positive  Cultures / Sepsis markers: Tracheal aspirate  Antibiotics: Vancomycin and cefepime   Protocols / Consultants: PCCM  Tests / Events: 6/24 prolonged CARDIAC ARREST 6/24 HYPOTHERMIA PROTOCOL 6/25 cardiogenic shock on vent and pressors 6/26 increased pressors for HD 6/27 remains on vent, +delerium, +pressors    CC  follow up respiratory failure  SUBJECTIVE Patient remains critically ill Prognosis is guarded On pressors On amiodarone +resp distress  BP (!) 104/54   Pulse 72   Temp 98.8 F (37.1 C)   Resp 16   Ht 5' 6"  (1.676 m)   Wt 74.1 kg   SpO2 100%   BMI 26.37 kg/m    I/O last 3 completed shifts: In: 2741.9 [I.V.:2441.9; IV Piggyback:300] Out: -  No intake/output data recorded.  SpO2: 100 % O2 Flow Rate (L/min): 10 L/min FiO2 (%): 30 %  Estimated body mass index is 26.37 kg/m as calculated from the following:   Height as of this encounter: 5' 6"  (1.676 m).   Weight as of this encounter: 74.1 kg.   REVIEW OF SYSTEMS  PATIENT IS UNABLE TO PROVIDE COMPLETE  REVIEW OF SYSTEMS DUE TO SEVERE CRITICAL ILLNESS        PHYSICAL EXAMINATION:  GENERAL:critically ill appearing, +resp distress HEAD: Normocephalic, atraumatic.  EYES: Pupils equal, round, reactive to light.  No scleral icterus.  MOUTH: Moist mucosal membrane. NECK: Supple.  S/p trach PULMONARY: +rhonchi,  CARDIOVASCULAR: S1 and S2. Regular rate and rhythm. No murmurs, rubs, or gallops.  GASTROINTESTINAL: Soft, nontender, -distended.  Positive bowel sounds.   MUSCULOSKELETAL: No swelling, clubbing, or edema.  NEUROLOGIC: more alert SKIN:intact,warm,dry  MEDICATIONS: I have reviewed all medications and confirmed regimen as documented   CULTURE RESULTS   Recent Results (from the past 240 hour(s))  Culture, blood (routine x 2)     Status: None (Preliminary result)   Collection Time: 11/28/19  1:31 PM   Specimen: BLOOD  Result Value Ref Range Status   Specimen Description BLOOD BLOOD RIGHT ARM  Final   Special Requests   Final    BOTTLES DRAWN AEROBIC AND ANAEROBIC Blood Culture adequate volume   Culture  Setup Time PENDING  Incomplete   Culture   Final    NO GROWTH 3 DAYS Performed at [facility], [address] [place], [place] [address]    Report Status PENDING  Incomplete  Culture, blood (routine x 2)     Status: None (Preliminary result)   Collection Time: 11/28/19  2:17 PM   Specimen: BLOOD  Result Value Ref Range Status   Specimen Description BLOOD BLOOD RIGHT WRIST  Final   Special Requests   Final  BOTTLES DRAWN AEROBIC AND ANAEROBIC Blood Culture adequate volume   Culture   Final    NO GROWTH 3 DAYS Performed at [facility], [address] [place], [place] [address]    Report Status PENDING  Incomplete  SARS Coronavirus 2 by RT PCR (hospital order, performed in [facility] hospital lab) Nasopharyngeal Nasopharyngeal Swab     Status: None   Collection Time: 11/28/19  4:40 PM   Specimen: Nasopharyngeal Swab  Result Value Ref Range  Status   SARS Coronavirus 2 NEGATIVE NEGATIVE Final    Comment: (NOTE) SARS-CoV-2 target nucleic acids are NOT DETECTED.  The SARS-CoV-2 RNA is generally detectable in upper and lower respiratory specimens during the acute phase of infection. The lowest concentration of SARS-CoV-2 viral copies this assay can detect is 250 copies / mL. A negative result does not preclude SARS-CoV-2 infection and should not be used as the sole basis for treatment or other patient management decisions.  A negative result may occur with improper specimen collection / handling, submission of specimen other than nasopharyngeal swab, presence of viral mutation(s) within the areas targeted by this assay, and inadequate number of viral copies (<250 copies / mL). A negative result must be combined with clinical observations, patient history, and epidemiological information.  Fact Sheet for Patients:   StrictlyIdeas.no  Fact Sheet for Healthcare Providers: BankingDealers.co.za  This test is not yet approved or  cleared by the [place] FDA and has been authorized for detection and/or diagnosis of SARS-CoV-2 by FDA under an Emergency Use Authorization (EUA).  This EUA will remain in effect (meaning this test can be used) for the duration of the COVID-19 declaration under Section 564(b)(1) of the Act, 21 U.S.C. section 360bbb-3(b)(1), unless the authorization is terminated or revoked sooner.  Performed at [facility], [address] [place], [place] [address]   MRSA PCR Screening     Status: None   Collection Time: 11/28/19  9:08 PM   Specimen: Nasopharyngeal  Result Value Ref Range Status   MRSA by PCR NEGATIVE NEGATIVE Final    Comment:        The GeneXpert MRSA Assay (FDA approved for NASAL specimens only), is one component of a comprehensive MRSA colonization surveillance program. It is not intended to diagnose MRSA infection nor to  guide or monitor treatment for MRSA infections. Performed at [facility], [address] [place], [place] [address]           IMAGING    DG Chest Port 1 View  Result Date: 12/02/2019 CLINICAL DATA:  Acute respiratory failure. EXAM: PORTABLE CHEST 1 VIEW COMPARISON:  11/30/2019 FINDINGS: The tracheostomy tube is stable. External pacer paddles are again noted. Stable mild cardiac enlargement and persistent lower lobe predominant airspace process and bilateral pleural effusions. IMPRESSION: Persistent lower lobe predominant airspace process and bilateral pleural effusions. Electronically Signed   By: Marijo Sanes M.D.   On: 12/02/2019 07:23     Nutrition Status: Nutrition Problem: Inadequate oral intake Etiology: inability to eat (Pt sedated and ventilated) Signs/Symptoms: NPO status       Indwelling Urinary Catheter continued, requirement due to   Reason to continue Indwelling Urinary Catheter strict Intake/Output monitoring for hemodynamic instability   Central Line/ continued, requirement due to  Reason to continue [place] of central venous pressure or other hemodynamic parameters and poor IV access   Ventilator continued, requirement due to severe respiratory failure   Ventilator Sedation RASS 0 to -2  ASSESSMENT AND PLAN SYNOPSIS  Severe ACUTE Hypoxic and Hypercapnic Respiratory Failuredue to acute NSTEMI and acute cardiac arrest with multiorgan failure +ischemic cardiomyopathy   Severe ACUTE Hypoxic and Hypercapnic Respiratory Failure -continue Full MV support -continue Bronchodilator Therapy -Wean Fio2 and PEEP as tolerated -will perform SAT/SBT when respiratory parameters are met -VAP/VENT bundle implementation  ACUTE SYSTOLIC CARDIAC FAILURE- EF 20% afib with RVR -follow up cardiology recs    END STAGE  KIDNEY INJURY/Renal Failure -Avoid nephrotoxic agents -Follow urine BMP -Ensure adequate renal perfusion, optimize  oxygenation -Renal dose medications HD as needed     NEUROLOGY Avoid sedatives  SHOCK-CARDIOGENIC -use vasopressors to keep MAP>65   CARDIAC ICU monitoring  ID -continue IV abx as prescibed -follow up cultures  GI GI PROPHYLAXIS as indicated  NUTRITIONAL STATUS Nutrition Status: Nutrition Problem: Inadequate oral intake Etiology: inability to eat (Pt sedated and ventilated) Signs/Symptoms: NPO status     DIET-->NPO Constipation protocol as indicated  ENDO - will use ICU hypoglycemic\Hyperglycemia protocol if indicated     ELECTROLYTES -follow labs as needed -replace as needed -pharmacy consultation and following   DVT/GI PRX ordered and assessed TRANSFUSIONS AS NEEDED MONITOR FSBS I Assessed the need for Labs I Assessed the need for Foley I Assessed the need for Central Venous Line Family Discussion when available I Assessed the need for Mobilization I made an Assessment of medications to be adjusted accordingly Safety Risk assessment completed   CASE DISCUSSED IN MULTIDISCIPLINARY ROUNDS WITH ICU TEAM  Critical Care Time devoted to patient care services described in this note is 32 minutes.   Overall, patient is critically ill, prognosis is guarded.    Corrin Parker, M.D.  [facility]  Medical Director [place] Director [facility] Cardio-Pulmonary Department

## 2019-12-03 ENCOUNTER — Other Ambulatory Visit: Payer: Self-pay

## 2019-12-03 LAB — CBC
HCT: 19.2 % — ABNORMAL LOW (ref 39.0–52.0)
HCT: 23.4 % — ABNORMAL LOW (ref 39.0–52.0)
Hemoglobin: 6.9 g/dL — ABNORMAL LOW (ref 13.0–17.0)
Hemoglobin: 8.6 g/dL — ABNORMAL LOW (ref 13.0–17.0)
MCH: 32.5 pg (ref 26.0–34.0)
MCH: 31.2 pg (ref 26.0–34.0)
MCHC: 35.9 g/dL (ref 30.0–36.0)
MCHC: 36.8 g/dL — ABNORMAL HIGH (ref 30.0–36.0)
MCV: 90.6 fL (ref 80.0–100.0)
MCV: 84.8 fL (ref 80.0–100.0)
Platelets: 170 10*3/uL (ref 150–400)
Platelets: 172 10*3/uL (ref 150–400)
RBC: 2.12 MIL/uL — ABNORMAL LOW (ref 4.22–5.81)
RBC: 2.76 MIL/uL — ABNORMAL LOW (ref 4.22–5.81)
RDW: 16.2 % — ABNORMAL HIGH (ref 11.5–15.5)
RDW: 15.8 % — ABNORMAL HIGH (ref 11.5–15.5)
WBC: 9.5 10*3/uL (ref 4.0–10.5)
WBC: 12.7 10*3/uL — ABNORMAL HIGH (ref 4.0–10.5)
nRBC: 0.6 % — ABNORMAL HIGH (ref 0.0–0.2)
nRBC: 1.1 % — ABNORMAL HIGH (ref 0.0–0.2)

## 2019-12-03 LAB — BASIC METABOLIC PANEL
Anion gap: 17 — ABNORMAL HIGH (ref 5–15)
BUN: 52 mg/dL — ABNORMAL HIGH (ref 8–23)
CO2: 33 mmol/L — ABNORMAL HIGH (ref 22–32)
Calcium: 8.1 mg/dL — ABNORMAL LOW (ref 8.9–10.3)
Chloride: 88 mmol/L — ABNORMAL LOW (ref 98–111)
Creatinine, Ser: 6.15 mg/dL — ABNORMAL HIGH (ref 0.61–1.24)
GFR calc Af Amer: 9 mL/min — ABNORMAL LOW (ref >60)
GFR calc non Af Amer: 8 mL/min — ABNORMAL LOW (ref >60)
Glucose, Bld: 165 mg/dL — ABNORMAL HIGH (ref 70–99)
Potassium: 4.2 mmol/L (ref 3.5–5.1)
Sodium: 138 mmol/L (ref 135–145)

## 2019-12-03 LAB — MAGNESIUM: Magnesium: 2 mg/dL (ref 1.7–2.4)

## 2019-12-03 LAB — GLUCOSE, CAPILLARY
Glucose-Capillary: 103 mg/dL — ABNORMAL HIGH (ref 70–99)
Glucose-Capillary: 120 mg/dL — ABNORMAL HIGH (ref 70–99)
Glucose-Capillary: 124 mg/dL — ABNORMAL HIGH (ref 70–99)
Glucose-Capillary: 127 mg/dL — ABNORMAL HIGH (ref 70–99)
Glucose-Capillary: 131 mg/dL — ABNORMAL HIGH (ref 70–99)
Glucose-Capillary: 141 mg/dL — ABNORMAL HIGH (ref 70–99)
Glucose-Capillary: 155 mg/dL — ABNORMAL HIGH (ref 70–99)

## 2019-12-03 LAB — PHOSPHORUS: Phosphorus: 5 mg/dL — ABNORMAL HIGH (ref 2.5–4.6)

## 2019-12-03 LAB — CULTURE, BLOOD (ROUTINE X 2)
Culture: NO GROWTH
Culture: NO GROWTH
Special Requests: ADEQUATE
Special Requests: ADEQUATE

## 2019-12-03 LAB — PREPARE RBC (CROSSMATCH)

## 2019-12-03 LAB — ABO/RH: ABO/RH(D): O NEG

## 2019-12-03 MED ORDER — NEPRO/CARBSTEADY PO LIQD
237.0000 mL | Freq: Two times a day (BID) | ORAL | Status: DC
  Administered 2019-12-03 – 2019-12-17 (×21): 237 mL via ORAL

## 2019-12-03 MED ORDER — RENA-VITE PO TABS
1.0000 | ORAL_TABLET | Freq: Every day | ORAL | Status: DC
  Administered 2019-12-03 – 2019-12-17 (×14): 1 via ORAL
  Filled 2019-12-03 (×15): qty 1

## 2019-12-03 MED ORDER — SODIUM CHLORIDE 0.9% IV SOLUTION: Freq: Once | INTRAVENOUS | Status: DC

## 2019-12-03 MED ORDER — CHLORHEXIDINE GLUCONATE 0.12 % MT SOLN
OROMUCOSAL | Status: AC
  Administered 2019-12-03: 15 mL via OROMUCOSAL
  Filled 2019-12-03: qty 15

## 2019-12-03 MED ORDER — EPOETIN ALFA 10000 UNIT/ML IJ SOLN
10000.0000 [IU] | INTRAMUSCULAR | Status: DC
  Administered 2019-12-03 – 2019-12-07 (×3): 10000 [IU] via INTRAVENOUS
  Filled 2019-12-03: qty 1

## 2019-12-03 MED ORDER — ONDANSETRON HCL 4 MG/2ML IJ SOLN
4.0000 mg | Freq: Four times a day (QID) | INTRAMUSCULAR | Status: DC | PRN
  Administered 2019-12-03 – 2019-12-11 (×2): 4 mg via INTRAVENOUS
  Filled 2019-12-03 (×2): qty 2

## 2019-12-03 MED ORDER — AMIODARONE IV BOLUS ONLY 150 MG/100ML
150.0000 mg | Freq: Once | INTRAVENOUS | Status: AC
  Administered 2019-12-03: 150 mg via INTRAVENOUS

## 2019-12-03 NOTE — Progress Notes (Signed)
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   Apparently patient lost pulse while having his the most recent dialysis session. Patient had also developed A. fib RVR and was apneic in the field and required bag mask ventilation. After arrival to the ER patient again coded x2 with V. fib arrest status post ACLS and ROSC, found to be amiodarone and 20 MCG Levophed.wifeat bedside.  Lines / Drains: Left upper extremity HD access, I/O positive  Cultures / Sepsis markers: Tracheal aspirate  Antibiotics: Vancomycin and cefepime   Protocols / Consultants: PCCM  Tests / Events: 6/24 prolonged CARDIAC ARREST 6/24 HYPOTHERMIA PROTOCOL 6/25 cardiogenic shock on vent and pressors 6/26 increased pressors for HD 6/27 remains on vent, +delerium, +pressors 6/28-6/29 off vent, alert and awake, on amiodarone    CC  follow up respiratory failure  SUBJECTIVE Patient remains  ill Prognosis is guarded On amio Alert and awake   BP (!) 104/57   Pulse 70   Temp 98.2 F (36.8 C) (Oral)   Resp (!) 28   Ht 5\' 6"  (1.676 m)   Wt 77.1 kg   SpO2 100%   BMI 27.43 kg/m    I/O last 3 completed shifts: In: 681 [I.V.:672; IV Piggyback:50] Out: 0  No intake/output data recorded.  SpO2: 100 % O2 Flow Rate (L/min): 5 L/min FiO2 (%): 28 %  Estimated body mass index is 27.43 kg/m as calculated from the following:   Height as of this encounter: 5\' 6"  (1.676 m).   Weight as of this encounter: 77.1 kg.  ROS limited +abd  pain +nausea   Physical Examination:   General Appearance: No distress  Neuro:without focal findings,   HEENT: PERRLA, EOM intact.  S/p trach Pulmonary: normal breath sounds, No wheezing.  CardiovascularNormal S1,S2.  No m/r/g.   Abdomen: Benign, Soft, non-tender. Renal:  No costovertebral tenderness  GU:  Not performed at this time. Endoc: No evident thyromegaly Skin:   warm, no rashes, no ecchymosis  Extremities: normal, no cyanosis, clubbing. PSYCHIATRIC: Mood, affect within normal limits.   ALL OTHER ROS ARE NEGATIVE   MEDICATIONS: I have reviewed all medications and confirmed regimen as documented   CULTURE RESULTS   Recent Results (from the past 240 hour(s))  Culture, blood (routine x 2)     Status: None   Collection Time: 11/28/19  1:31 PM   Specimen: BLOOD  Result Value Ref Range Status   Specimen Description BLOOD BLOOD RIGHT ARM  Final   Special Requests   Final    BOTTLES DRAWN AEROBIC AND ANAEROBIC Blood Culture adequate volume   Culture   Final    NO GROWTH 5 DAYS Performed at [facility], [address], [place], [place] [address]    Report Status 12/03/2019 FINAL  Final  Culture, blood (routine x 2)     Status: None   Collection Time: 11/28/19  2:17 PM   Specimen: BLOOD  Result Value Ref Range Status   Specimen Description BLOOD BLOOD RIGHT WRIST  Final   Special Requests   Final    BOTTLES DRAWN AEROBIC AND ANAEROBIC Blood Culture adequate volume  Culture   Final    NO GROWTH 5 DAYS Performed at [facility], [address] [place], [place] [address]    Report Status 12/03/2019 FINAL  Final  SARS Coronavirus 2 by RT PCR (hospital order, performed in [facility] hospital lab) Nasopharyngeal Nasopharyngeal Swab     Status: None   Collection Time: 11/28/19  4:40 PM   Specimen: Nasopharyngeal Swab  Result Value Ref Range Status   SARS Coronavirus 2 NEGATIVE NEGATIVE Final    Comment: (NOTE) SARS-CoV-2 target nucleic acids  are NOT DETECTED.  The SARS-CoV-2 RNA is generally detectable in upper and lower respiratory specimens during the acute phase of infection. The lowest concentration of SARS-CoV-2 viral copies this assay can detect is 250 copies / mL. A negative result does not preclude SARS-CoV-2 infection and should not be used as the sole basis for treatment or other patient management decisions.  A negative result may occur with improper specimen collection / handling, submission of specimen other than nasopharyngeal swab, presence of viral mutation(s) within the areas targeted by this assay, and inadequate number of viral copies (<250 copies / mL). A negative result must be combined with clinical observations, patient history, and epidemiological information.  Fact Sheet for Patients:   StrictlyIdeas.no  Fact Sheet for Healthcare Providers: BankingDealers.co.za  This test is not yet approved or  cleared by the [place] FDA and has been authorized for detection and/or diagnosis of SARS-CoV-2 by FDA under an Emergency Use Authorization (EUA).  This EUA will remain in effect (meaning this test can be used) for the duration of the COVID-19 declaration under Section 564(b)(1) of the Act, 21 U.S.C. section 360bbb-3(b)(1), unless the authorization is terminated or revoked sooner.  Performed at [facility], [address] [place], [place] [address]   MRSA PCR Screening     Status: None   Collection Time: 11/28/19  9:08 PM   Specimen: Nasopharyngeal  Result Value Ref Range Status   MRSA by PCR NEGATIVE NEGATIVE Final    Comment:        The GeneXpert MRSA Assay (FDA approved for NASAL specimens only), is one component of a comprehensive MRSA colonization surveillance program. It is not intended to diagnose MRSA infection nor to guide or monitor treatment for MRSA infections. Performed at [facility], [address] [place], [place] [address]          CBC    Component Value Date/Time   WBC 9.5 12/03/2019 0405   RBC 2.12 (L) 12/03/2019 0405   HGB 6.9 (L) 12/03/2019 0405   HCT 19.2 (L) 12/03/2019 0405   PLT 170 12/03/2019 0405   MCV 90.6 12/03/2019 0405   MCH 32.5 12/03/2019 0405   MCHC 35.9 12/03/2019 0405   RDW 16.2 (H) 12/03/2019 0405   LYMPHSABS 0.6 (L) 11/29/2019 0610   MONOABS 0.6 11/29/2019 0610   EOSABS 0.0 11/29/2019 0610   BASOSABS 0.0 11/29/2019 0610   BMP Latest Ref Rng & Units 12/03/2019 12/02/2019 12/01/2019  Glucose 70 - 99 mg/dL 165(H) 165(H) 205(H)  BUN 8 - 23 mg/dL 52(H) 35(H) 24(H)  Creatinine 0.61 - 1.24 mg/dL 6.15(H) 5.14(H) 3.91(H)  Sodium 135 - 145 mmol/L 138 138 137  Potassium 3.5 - 5.1 mmol/L 4.2 4.1 4.2  Chloride 98 - 111 mmol/L 88(L) 89(L) 92(L)  CO2 22 - 32 mmol/L 33(H) 34(H) 33(H)  Calcium 8.9 - 10.3 mg/dL 8.1(L) 7.9(L) 7.4(L)     Indwelling Urinary Catheter continued, requirement due to  Reason to continue Indwelling Urinary Catheter strict Intake/Output monitoring for hemodynamic instability   Central Line/ continued, requirement due to  Reason to continue [place] of central venous pressure or other hemodynamic parameters and poor IV access      ASSESSMENT AND PLAN SYNOPSIS  Severe ACUTE Hypoxic and Hypercapnic Respiratory Failuredue to acute NSTEMI and acute cardiac arrest with multiorgan failure +ischemic cardiomyopathy   resp failure resolved TCT as tolerated  ACUTE SYSTOLIC CARDIAC FAILURE-  -oxygen as needed -follow up cardiology recs   END STAGE  KIDNEY INJURY/Renal Failure HD as needed     NEUROLOGY improving slowly  SHOCK-CARDIOGENIC -use vasopressors to keep MAP>65  CARDIAC ICU monitoring   GI GI PROPHYLAXIS as indicated  DIET-->NPO Constipation protocol as indicated  ENDO - will use ICU hypoglycemic\Hyperglycemia protocol if indicated     ELECTROLYTES -follow labs as needed -replace as  needed -pharmacy consultation and following   DVT/GI PRX ordered and assessed TRANSFUSIONS AS NEEDED MONITOR FSBS I Assessed the need for Labs I Assessed the need for Foley I Assessed the need for Central Venous Line Family Discussion when available I Assessed the need for Mobilization I made an Assessment of medications to be adjusted accordingly Safety Risk assessment completed  prognosis is guarded.  Prognosis is poor     Corrin Parker, M.D.  [facility]  Medical Director [place] Director [facility] Cardio-Pulmonary Department

## 2019-12-03 NOTE — Progress Notes (Signed)
Pt noted to be in afib with rvr during dialysis treatment, ICU MD notified, Cardiologist paged.  Per Cards, verbal order given to bolus 150mg  amio and continue drip, then update on med effectiveness.  Med delivered, will continue to monitor patient.

## 2019-12-03 NOTE — Progress Notes (Signed)
This note also relates to the following rows which could not be included: Pulse Rate - Cannot attach notes to unvalidated device data Resp - Cannot attach notes to unvalidated device data BP - Cannot attach notes to unvalidated device data  Hd started  

## 2019-12-03 NOTE — Progress Notes (Signed)
[facility]  ROUNDING NOTE   Subjective:   Hypotension during dialysis. Given norepinephrine and amiodarone.  PRBC with HD treatment.   Objective:  Vital signs in last 24 hours:  Temp:  [97.5 F (36.4 C)-98.6 F (37 C)] 97.5 F (36.4 C) (06/29 1350) Pulse Rate:  [28-129] 75 (06/29 1445) Resp:  [14-37] 21 (06/29 1445) BP: (78-115)/(50-81) 105/81 (06/29 1445) SpO2:  [73 %-100 %] 96 % (06/29 1200) FiO2 (%):  [28 %] 28 % (06/29 1200) Weight:  [77.1 kg] 77.1 kg (06/29 0446)  Weight change: 3 kg Filed Weights   12/01/19 0329 12/02/19 0500 12/03/19 0446  Weight: 72.1 kg 74.1 kg 77.1 kg    Intake/Output: I/O last 3 completed shifts: In: 722 [I.V.:672; IV Piggyback:50] Out: 0    Intake/Output this shift:  Total I/O In: 133.3 [I.V.:83.3; IV Piggyback:50] Out: 0   Physical Exam: General: Critically ill  Head: Normocephalic, atraumatic. Moist oral mucosal membranes  Eyes: Anicteric, PERRL  Neck: +trach  Lungs:  clear  Heart: Regular rate and rhythm  Abdomen:  Soft, nontender  Extremities: no peripheral edema.  Neurologic: Nonfocal, moving all four extremities  Skin: No lesions  Access: Left AVG    Basic Metabolic Panel: Recent Labs  Lab 11/28/19 1919 11/28/19 1919 11/29/19 0610 11/29/19 1810 11/30/19 0415 11/30/19 0415 11/30/19 0934 11/30/19 1558 11/30/19 1558 12/01/19 0356 12/02/19 0526 12/03/19 0405  NA 145   < > 148*   < > 143  --   --  137  --  137 138 138  K 3.8   < > 4.7   < > 3.4*  --   --  3.3*  --  4.2 4.1 4.2  CL 105   < > 103   < > 98  --   --  95*  --  92* 89* 88*  CO2 19*   < > 25   < > 33*  --   --  31  --  33* 34* 33*  GLUCOSE 184*   < > 326*   < > 168*  --   --  129*  --  205* 165* 165*  BUN 28*   < > 34*   < > 39*  --   --  12  --  24* 35* 52*  CREATININE 4.68*   < > 4.97*   < > 5.89*  --   --  1.98*  --  3.91* 5.14* 6.15*  CALCIUM 8.2*   < > 8.0*   < > 7.4*   < >  --  7.5*   < > 7.4* 7.9* 8.1*  MG 2.4  --  2.2  --   --   --    --  1.8  --   --  1.7 2.0  PHOS 4.7*   < > 5.4*  --  3.2  --  3.3  --   --  2.4* 2.9 5.0*   < > = values in this interval not displayed.    Liver Function Tests: Recent Labs  Lab 11/28/19 1331 11/28/19 1638 11/30/19 0415 12/01/19 0356 12/02/19 0526  AST 343* 386*  --   --   --   ALT 300* 315*  --   --   --   ALKPHOS 65 51  --   --   --   BILITOT 1.5* 1.3*  --   --   --   PROT 5.8* 4.2*  --   --   --  ALBUMIN 3.3* 2.3* 2.8* 2.6* 2.7*   No results for input(s): LIPASE, AMYLASE in the last 168 hours. No results for input(s): AMMONIA in the last 168 hours.  CBC: Recent Labs  Lab 11/28/19 1331 11/28/19 1331 11/29/19 0610 11/30/19 0415 12/01/19 0356 12/02/19 0526 12/03/19 0405  WBC 10.1   < > 20.0* 10.0 7.9 10.0 9.5  NEUTROABS 6.9  --  18.6*  --   --   --   --   HGB 9.2*   < > 7.4* 7.3* 7.4* 7.2* 6.9*  HCT 25.8*   < > 20.7* 20.6* 20.1* 19.3* 19.2*  MCV 92.1   < > 90.4 90.7 86.6 86.5 90.6  PLT 154   < > 139* 123* 132* 157 170   < > = values in this interval not displayed.    Cardiac Enzymes: No results for input(s): CKTOTAL, CKMB, CKMBINDEX, TROPONINI in the last 168 hours.  BNP: Invalid input(s): POCBNP  CBG: Recent Labs  Lab 12/02/19 1952 12/03/19 0038 12/03/19 0407 12/03/19 0736 12/03/19 1114  GLUCAP 171* 124* 127* 131* 141*    Microbiology: Results for orders placed or performed during the hospital encounter of 11/28/19  Culture, blood (routine x 2)     Status: None   Collection Time: 11/28/19  1:31 PM   Specimen: BLOOD  Result Value Ref Range Status   Specimen Description BLOOD BLOOD RIGHT ARM  Final   Special Requests   Final    BOTTLES DRAWN AEROBIC AND ANAEROBIC Blood Culture adequate volume   Culture   Final    NO GROWTH 5 DAYS Performed at [facility], [address], [place], [place] [address]    Report Status 12/03/2019 FINAL  Final  Culture, blood (routine x 2)     Status: None   Collection Time: 11/28/19  2:17 PM    Specimen: BLOOD  Result Value Ref Range Status   Specimen Description BLOOD BLOOD RIGHT WRIST  Final   Special Requests   Final    BOTTLES DRAWN AEROBIC AND ANAEROBIC Blood Culture adequate volume   Culture   Final    NO GROWTH 5 DAYS Performed at [facility], [address], [place],  [address]    Report Status 12/03/2019 FINAL  Final  SARS Coronavirus 2 by RT PCR (hospital order, performed in [place] hospital lab) Nasopharyngeal Nasopharyngeal Swab     Status: None   Collection Time: 11/28/19  4:40 PM   Specimen: Nasopharyngeal Swab  Result Value Ref Range Status   SARS Coronavirus 2 NEGATIVE NEGATIVE Final    Comment: (NOTE) SARS-CoV-2 target nucleic acids are NOT DETECTED.  The SARS-CoV-2 RNA is generally detectable in upper and lower respiratory specimens during the acute phase of infection. The lowest concentration of SARS-CoV-2 viral copies this assay can detect is 250 copies / mL. A negative result does not preclude SARS-CoV-2 infection and should not be used as the sole basis for treatment or other patient management decisions.  A negative result may occur with improper specimen collection / handling, submission of specimen other than nasopharyngeal swab, presence of viral mutation(s) within the areas targeted by this assay, and inadequate number of viral copies (<250 copies / mL). A negative result must be combined with clinical observations, patient history, and epidemiological information.  Fact Sheet for Patients:   StrictlyIdeas.no  Fact Sheet for Healthcare Providers: BankingDealers.co.za  This test is not yet approved or  cleared by the [place] FDA and has been authorized for detection  and/or diagnosis of SARS-CoV-2 by FDA under an Emergency Use Authorization (EUA).  This EUA will remain in effect (meaning this test can be used) for the duration of the COVID-19 declaration under Section  564(b)(1) of the Act, 21 U.S.C. section 360bbb-3(b)(1), unless the authorization is terminated or revoked sooner.  Performed at [facility], [address] [place], [place] [address]   MRSA PCR Screening     Status: None   Collection Time: 11/28/19  9:08 PM   Specimen: Nasopharyngeal  Result Value Ref Range Status   MRSA by PCR NEGATIVE NEGATIVE Final    Comment:        The GeneXpert MRSA Assay (FDA approved for NASAL specimens only), is one component of a comprehensive MRSA colonization surveillance program. It is not intended to diagnose MRSA infection nor to guide or monitor treatment for MRSA infections. Performed at [facility], [address] [place], [place] [address]     Coagulation Studies: No results for input(s): LABPROT, INR in the last 72 hours.  Urinalysis: No results for input(s): COLORURINE, LABSPEC, PHURINE, GLUCOSEU, HGBUR, BILIRUBINUR, KETONESUR, PROTEINUR, UROBILINOGEN, NITRITE, LEUKOCYTESUR in the last 72 hours.  Invalid input(s): APPERANCEUR    Imaging: DG Chest Port 1 View  Result Date: 12/02/2019 CLINICAL DATA:  Acute respiratory failure. EXAM: PORTABLE CHEST 1 VIEW COMPARISON:  11/30/2019 FINDINGS: The tracheostomy tube is stable. External pacer paddles are again noted. Stable mild cardiac enlargement and persistent lower lobe predominant airspace process and bilateral pleural effusions. IMPRESSION: Persistent lower lobe predominant airspace process and bilateral pleural effusions. Electronically Signed   By: Marijo Sanes M.D.   On: 12/02/2019 07:23     Medications:   . sodium chloride    . sodium chloride Stopped (11/28/19 2021)  . sodium chloride    . sodium chloride    . amiodarone 30 mg/hr (12/03/19 1310)  . famotidine (PEPCID) IV Stopped (12/03/19 0934)  . norepinephrine (LEVOPHED) Adult infusion 4 mcg/min (12/03/19 1346)   . sodium chloride   Intravenous Once  . chlorhexidine gluconate (MEDLINE KIT)  15 mL  Mouth Rinse BID  . Chlorhexidine Gluconate Cloth  6 each Topical Q0600  . insulin aspart  0-6 Units Subcutaneous Q4H  . mouth rinse  15 mL Mouth Rinse 10 times per day  . midazolam  2 mg Intravenous Once  . polyethylene glycol  17 g Oral Daily   sodium chloride, sodium chloride, alteplase, bisacodyl, fentaNYL (SUBLIMAZE) injection, heparin, lidocaine (PF), lidocaine-prilocaine, midazolam, midazolam, ondansetron (ZOFRAN) IV, pentafluoroprop-tetrafluoroeth, polyethylene glycol  Assessment/ Plan:  Kerry [name] is a 86 y.o. 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 End-stage renal disease: dialysis for tomorrow. Monitor potassium  2. Hypokalemia: has a history of hyperkalemia. Unclear cause of hypokalemia.  Consider restarting ARB/ACE-I to help increase serum potassium levels when blood pressure allows  3. Hypotension: with recent cardiac arrest. Holding home regimen of amlodipnie, hydralazine, isosorbide mononitrate, torsemide, and metoprolol.  Currently requiring norepinephrine.   4. Anemia with chronic kidney disease: receives Mircera as outpatient.  - EPO with HD treatment.  - PRBC transfusion with HD treatment  LOS: 5 Philippa Vessey 6/29/20212:58 PM

## 2019-12-03 NOTE — Progress Notes (Signed)
Pt heart rate improved, HR 79, BP 105/81, will continue to monitor

## 2019-12-03 NOTE — Progress Notes (Signed)
Nutrition Follow-up  DOCUMENTATION CODES:   Not applicable  INTERVENTION:  Provide Nepro Shake po BID, each supplement provides 425 kcal and 19 grams protein.  Provide Rena-vite QHS.  NUTRITION DIAGNOSIS:   Increased nutrient needs related to catabolic illness (CHF, COPD, ESRD on HD) as evidenced by estimated needs.  New nutrition diagnosis.  GOAL:   Patient will meet greater than or equal to 90% of their needs  Progressing with diet advancement.  MONITOR:   PO intake, Supplement acceptance, Labs, Weight trends, I & O's  REASON FOR ASSESSMENT:   Ventilator    ASSESSMENT:   86 year old male with history of end-stage renal disease on hemodialysis, history of laryngeal carcinoma status post tracheostomy, hyperlipidemia, sleep apnea, CHF, COPD, HTN, DM who is admitted with cardiac arrest during hemodialysis  Patient was weaned to trach collar. Met with patient and his wife at bedside. Wife reports patient has a good appetite at baseline and eats well at meals. She reports he follows dietary restrictions related to HD and eats plenty of protein. Patient reports appetite feels decreased now. Patient is amenable to drinking oral nutrition supplement once diet is able to be advanced to help meet calorie/protein needs.  Later in the afternoon diet was advanced to dysphagia 2 with thin liquids following SLP evaluation.  Medications reviewed and include: Epogen 10000 units during HD, Novolog 0-6 units Q4hrs, Miralax, famotidine, norepinephrine gtt at 4 mcg/min.  Labs reviewed: CBG 127-141, Chloride 88, CO2 33, BUN 52, Creatinine 6.15, Anion gap 17, Phosphorus 5.  I/O: pt anuric  Weight trend: 77.1 kg on 6/29 (+17.1 kg from 6/24) but unsure if current weight is accurate; dry wt is 61 kg per Nephrology note  Discussed on rounds.  NUTRITION - FOCUSED PHYSICAL EXAM:    Most Recent Value  Orbital Region No depletion  Upper Arm Region No depletion  Thoracic and Lumbar Region No  depletion  Buccal Region No depletion  Temple Region No depletion  Clavicle Bone Region No depletion  Clavicle and Acromion Bone Region No depletion  Scapular Bone Region No depletion  Dorsal Hand No depletion  Patellar Region Mild depletion  Anterior Thigh Region Mild depletion  Posterior Calf Region Mild depletion  Edema (RD Assessment) Mild  Hair Reviewed  Eyes Reviewed  Mouth Reviewed  Skin Reviewed  Nails Reviewed     Diet Order:   Diet Order            DIET DYS 2 Room service appropriate? Yes with Assist; Fluid consistency: Thin  Diet effective now                EDUCATION NEEDS:   No education needs have been identified at this time  Skin:  Skin Assessment: Reviewed RN Assessment  Last BM:  12/02/2019  Height:   Ht Readings from Last 1 Encounters:  11/28/19 _0  (1.676 m)   Weight:   Wt Readings from Last 1 Encounters:  12/03/19 77.1 kg   Ideal Body Weight:  64.5 kg  BMI:  Body mass index is 27.43 kg/m.  Estimated Nutritional Needs:   Kcal:  1850-2050  Protein:  95-105 grams  Fluid:  UOP + 1 L  Jacklynn Barnacle, MS, RD, LDN Pager number available on Amion

## 2019-12-03 NOTE — Progress Notes (Signed)
Pt tolerated trach collar for the entire night. Plan to have speech assess swallowing, otherwise an alternative route for nutrition will need to be determined.   He did not rest well at any point in the night and may benefit from a sleep aid as well as a discontinuation of q2 oral care.

## 2019-12-03 NOTE — Progress Notes (Signed)
This note also relates to the following rows which could not be included: Pulse Rate - Cannot attach notes to unvalidated device data Resp - Cannot attach notes to unvalidated device data  Hd completed  

## 2019-12-03 NOTE — Progress Notes (Signed)
Dr Juleen China notified pf patients status.

## 2019-12-03 NOTE — Progress Notes (Signed)
PT Cancellation Note  Patient Details Name: Kerry [name] MRN: [id number] DOB: [birth year]   Cancelled Treatment:    Reason Eval/Treat Not Completed: Patient not medically ready.  PT consult received.  Chart reviewed.  Pt's Hgb noted to be down-trending to 6.9 this morning.  Per PT guidelines for low Hgb, will hold PT at this time and re-attempt PT evaluation at a later date/time as medically appropriate.  Leitha Bleak, PT 12/03/19, 8:10 AM

## 2019-12-03 NOTE — Progress Notes (Signed)
Per Dr. Zoila Shutter order, his trach was changed to a #6 Shiley cuffless trach.

## 2019-12-03 NOTE — Evaluation (Signed)
Clinical/Bedside Swallow Evaluation Patient Details  Name: Kerry Kerry [name] MRN: [id number] Date of Birth: [birth year]  Today's Date: 12/03/2019 Time: SLP Start Time (ACUTE ONLY): 4 SLP Stop Time (ACUTE ONLY): 1245 SLP Time Calculation (min) (ACUTE ONLY): 75 min  Past Medical History:  Past Medical History:  Diagnosis Date  . Cancer ([place])   . CHF (congestive heart failure) ([place])   . COPD (chronic obstructive pulmonary disease) ([place])   . Diabetes mellitus without complication ([place])   . Hypertension   . Renal disorder    Past Surgical History:  Past Surgical History:  Procedure Laterality Date  . 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  Apparently patient lost pulse while having his the most recent dialysis session.  Patient had also developed A. fib RVR and was apneic in the field and required bag mask ventilation.  After arrival to the ER patient again coded x2 with V. fib arrest status post ACLS and ROSC.  Per chart notes, pt recently had signs of pneumonia with respiratory cultures and bronchoscopy done at [facility] with BAL showing noncryptococcal yeast forms and scattered bacterial cocci negative for AFB.  A trach was placed while pt has been on vent support; now weaned to TC O2 support.   Assessment / Plan / Recommendation Clinical Impression  Pt appears to present w/ grossly adequate oropharyngeal phase swallow function in light of having had a total laryngectomy i 2016 d/t laryngeal cancer and vocal cord paralysis per chart notes. Pt has been on a Regular diet w/ thin liquids at home at baseline. Pt and Wife  endorsed BASELINE mild, overt coughing during meals PRIOR to this illness -- this is known to pt's MDs and Otolaryngologist/SLP over the years as he follows up w/ them every 4 months d/t use of a TEP for verbal communication did exhibit. Most recent [facility] visit notes indicated adequate placement and fitting of the TEP device w/ minimal concern re: leakage. For this evaluation today, chart notes indicate pt was removed from vent support on 12/01/2019 s/p trach placement post Cardiac Arrest on 11/28/2019 during HD session. Pt and Wife are eager to begin po trials. Oral care completed, and pt positioned upright in bed. Discussed his Baseline swallowing noting the intermittent coughing during meals/with po's. HR 86, RR 25, O2 sats 97%. Pt consumed trials of ice chips, thin liquids, purees/softened solids w/ no immediate, overt clinical s/s of aspiration noted -- no decline in ANS or respiratory effort. Noted noted min audible swallows suspect d/t altered pharyngeal pressure secondary to laryngectomy. Oral phase appeared [facility] for bolus management and oral clearing w/ each trial. Pt took his time for lingual sweeping and utilized f/u swallows w/ trials. OM exam appeared wfl; no unilateral weakness noted. Pt fed self w/ support d/t UE weakness, shakiness. Of note, a delayed mild coughing episode was noted x1 which eased quickly and did not occur again -- pt's Wife stated this was similar to what she witnessed at home during meals. Pt nodded in agreement; he also endorsed that the swallowing seemed and felt "normal" for him during trials given.  Discussed the above w/ NSG and MD. Recommend a dysphagia level 2(for ease  of pharyngoesophageal swallow) diet w/ added purees for ease; thin liquids. Recommend general aspiration precautions; Reflux precautions. Pills Crushed in puree as able. Recommend monitoring for any increased coughing during oral intake, or decline in Pulmonary status post meals. SLP Visit Diagnosis: Dysphagia,  pharyngoesophageal phase (R13.14) (Esophageal phase )    Aspiration Risk  Mild aspiration risk;Risk for inadequate nutrition/hydration    Diet Recommendation  Dysphagia level 2 (minced foods for ease, moistened well); Thin liquids. General aspiration and Reflux precautions. Support at meals d/t generalized weaknees  Medication Administration: Crushed with puree (for ease)    Other  Recommendations Recommended Consults: Consider esophageal assessment (f/u w/ SLP re: TEP device) Oral Care Recommendations: Oral care BID;Oral care before and after PO;Staff/trained caregiver to provide oral care Other Recommendations:  (n/a)   Follow up Recommendations None (TBD)      Frequency and Duration min 3x week  2 weeks       Prognosis Prognosis for Safe Diet Advancement: Fair Barriers to Reach Goals: Time post onset;Severity of deficits (total Laryngectomy)      Swallow Kerry [name]   General Date of Onset: 11/28/19 HPI: Pt is a 86 year old male with history of end-stage renal disease on hemodialysis, history of laryngeal carcinoma status post tracheostomy -- 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  Apparently patient lost pulse while having his the most recent dialysis session.  Patient had also developed A. fib RVR and was apneic in the field and required bag mask ventilation.  After arrival to the ER patient again coded x2 with V. fib arrest status post ACLS and ROSC.  Per chart notes, pt recently had signs of pneumonia with respiratory cultures and bronchoscopy done at [facility] with BAL showing noncryptococcal yeast forms and scattered bacterial cocci negative for AFB.  A trach was placed while pt has been on vent support; now weaned to TC O2 support. Type of Kerry [name]: Bedside Swallow  Evaluation Previous Swallow Assessment: is followed by SLP/Otolaryngology at [facility] for TEP prosthesis Diet Prior to this Kerry [name]: NPO (regular diet at home per pt/Wife ) Temperature Spikes Noted: No (wbc 9.5) Respiratory Status: Trach Collar (5L) History of Recent Intubation:  (on vent support via trach) Behavior/Cognition: Alert;Cooperative;Pleasant mood (required support) Oral Cavity Assessment: Within Functional Limits Oral Care Completed by SLP: Yes Oral Cavity - Dentition: Adequate natural dentition (partial) Vision: Functional for self-feeding Self-Feeding Abilities: Able to feed self;Needs assist;Needs set up;Total assist (weak UEs, shaky) Patient Positioning: Upright in bed (needed positioning support) Baseline Vocal Quality:  (aphonic) Volitional Cough:  (unable) Volitional Swallow: Able to elicit    Oral/Motor/Sensory Function Overall Oral Motor/Sensory Function: Within functional limits   Ice Chips Ice chips: Within functional limits Presentation: Spoon (fed; 6 trials)   Thin Liquid Thin Liquid: Within functional limits (grossly) Presentation: Cup;Self Fed;Straw (fully supported) Other Comments: noted min audible swallows suspect d/t altered pharyngeal pressure secondary to laryngectomy    Nectar Thick Nectar Thick Liquid: Not tested   Honey Thick Honey Thick Liquid: Not tested   Puree Puree: Within functional limits (grossly) Presentation: Spoon (fed; 4 trials) Other Comments: noted min audible swallows suspect d/t altered pharyngeal pressure secondary to laryngectomy   Solid     Solid: Impaired (min - decreased effort) Presentation: Spoon (fed; 2 trials) Oral Phase Impairments:  (increased time) Oral Phase Functional Implications:  (increased time)  Pharyngeal Phase Impairments:  (none) Other Comments: noted min audible swallows suspect d/t altered pharyngeal pressure secondary to laryngectomy       [name], MS, CCC-SLP Brystal Kildow 12/03/2019,3:31  PM

## 2019-12-03 NOTE — Progress Notes (Signed)
   12/03/19 0700  Clinical Encounter Type  Visited With Patient and family together  Visit Type Initial  Referral From Chaplain  Consult/Referral To Chaplain  Chaplain stopped in a briefly visited with patient and his wife. Chaplain prayed with them and told them that she will check in on them periodically.

## 2019-12-03 NOTE — Progress Notes (Signed)
Patient Name: Kerry [name] Date of Encounter: 12/03/2019  Hospital Problem List     Active Problems:   Cardiac arrest [facility])    Patient Profile     86 y.o.malewith history of86 y.o.male patient who has a history of diabetes, hypertension,laryngeal carcinoma s/p tracheostomy,end-stage kidney disease currently on hemodialysis, hyperlipidemia and sleep apnea who was admitted after suffering a cardiac arrest during hemodialysis. Was in vf per report in dialysis and had two more cardiac arrest episodes while in the er. He currently is in the code ice protocol and is on iv amiodarone, iv levophed. He has a fairly complicated past medical history including he has undergone placement of a tracheostomy due to laryngeal carcinoma and resultant laryngectomy.  Mental status is gradually improving.  Subjective   On trach collar throughout the night.  Continues to become more alert.  Planned swallowing study today.  Inpatient Medications    . sodium chloride   Intravenous Once  . chlorhexidine gluconate (MEDLINE KIT)  15 mL Mouth Rinse BID  . Chlorhexidine Gluconate Cloth  6 each Topical Q0600  . insulin aspart  0-6 Units Subcutaneous Q4H  . mouth rinse  15 mL Mouth Rinse 10 times per day  . midazolam  2 mg Intravenous Once  . polyethylene glycol  17 g Oral Daily    Vital Signs    Vitals:   12/03/19 0446 12/03/19 0500 12/03/19 0600 12/03/19 0700  BP:  (!) 94/52 (!) 85/53 (!) 104/57  Pulse:  70    Resp:  (!) 25 (!) 31 (!) 28  Temp:      TempSrc:      SpO2:  91% 97% 100%  Weight: 77.1 kg     Height:        Intake/Output Summary (Last 24 hours) at 12/03/2019 0728 Last data filed at 12/03/2019 0700 Gross per 24 hour  Intake 498.11 ml  Output 0 ml  Net 498.11 ml   Filed Weights   12/01/19 0329 12/02/19 0500 12/03/19 0446  Weight: 72.1 kg 74.1 kg 77.1 kg    Physical Exam    GEN: No acute distress.  HEENT: normal.  Neck: Supple, no JVD, carotid bruits, or masses. Cardiac:  RRR, no murmurs, rubs, or gallops. No clubbing, cyanosis, edema.  Radials/DP/PT 2+ and equal bilaterally.  Respiratory:  Respirations regular and unlabored, trach collar in place GI: Soft, nontender, nondistended, BS + x 4. MS: no deformity or atrophy. Skin: warm and dry, no rash. Neuro:  Strength and sensation are intact. Psych: Normal affect.  Labs    CBC Recent Labs    12/02/19 0526 12/03/19 0405  WBC 10.0 9.5  HGB 7.2* 6.9*  HCT 19.3* 19.2*  MCV 86.5 90.6  PLT 157 295   Basic Metabolic Panel Recent Labs    12/02/19 0526 12/03/19 0405  NA 138 138  K 4.1 4.2  CL 89* 88*  CO2 34* 33*  GLUCOSE 165* 165*  BUN 35* 52*  CREATININE 5.14* 6.15*  CALCIUM 7.9* 8.1*  MG 1.7 2.0  PHOS 2.9 5.0*   Liver Function Tests Recent Labs    12/01/19 0356 12/02/19 0526  ALBUMIN 2.6* 2.7*   No results for input(s): LIPASE, AMYLASE in the last 72 hours. Cardiac Enzymes No results for input(s): CKTOTAL, CKMB, CKMBINDEX, TROPONINI in the last 72 hours. BNP No results for input(s): BNP in the last 72 hours. D-Dimer No results for input(s): DDIMER in the last 72 hours. Hemoglobin A1C No results for input(s): HGBA1C in the  last 72 hours. Fasting Lipid Panel No results for input(s): CHOL, HDL, LDLCALC, TRIG, CHOLHDL, LDLDIRECT in the last 72 hours. Thyroid Function Tests No results for input(s): TSH, T4TOTAL, T3FREE, THYROIDAB in the last 72 hours.  Invalid input(s): FREET3  Telemetry    Normal sinus rhythm, PVCs  ECG    Sinus rhythm, left axis deviation  Radiology    CT HEAD WO CONTRAST  Result Date: 11/28/2019 CLINICAL DATA:  Status post cardiac arrest. EXAM: CT HEAD WITHOUT CONTRAST TECHNIQUE: Contiguous axial images were obtained from the base of the skull through the vertex without intravenous contrast. COMPARISON:  None. FINDINGS: Brain: There is mild cerebral atrophy with widening of the extra-axial spaces and ventricular dilatation. There are areas of decreased  attenuation within the white matter tracts of the supratentorial brain, consistent with microvascular disease changes. Vascular: No hyperdense vessel or unexpected calcification. Skull: Normal. Negative for fracture or focal lesion. Sinuses/Orbits: No acute finding. Other: None. IMPRESSION: 1. Generalized cerebral atrophy. 2. No acute intracranial abnormality. Electronically Signed   By: Virgina Norfolk M.D.   On: 11/28/2019 18:06   DG Chest Port 1 View  Result Date: 12/02/2019 CLINICAL DATA:  Acute respiratory failure. EXAM: PORTABLE CHEST 1 VIEW COMPARISON:  11/30/2019 FINDINGS: The tracheostomy tube is stable. External pacer paddles are again noted. Stable mild cardiac enlargement and persistent lower lobe predominant airspace process and bilateral pleural effusions. IMPRESSION: Persistent lower lobe predominant airspace process and bilateral pleural effusions. Electronically Signed   By: Marijo Sanes M.D.   On: 12/02/2019 07:23   DG Chest Port 1 View  Result Date: 11/30/2019 CLINICAL DATA:  Acute respiratory failure EXAM: PORTABLE CHEST 1 VIEW COMPARISON:  11/28/2019 FINDINGS: Single frontal view of the chest demonstrates tracheostomy tube overlying tracheal air column tip at thoracic inlet. External defibrillator pads are again noted. Cardiac silhouette is unremarkable. Since the prior exam, there is decreased interstitial prominence. Persistent central vascular congestion with trace right pleural effusion. No pneumothorax. IMPRESSION: 1. Persistent central vascular congestion and trace right pleural effusion, with improving interstitial edema since prior exam. Electronically Signed   By: Randa Ngo M.D.   On: 11/30/2019 03:14   DG Chest Portable 1 View  Result Date: 11/28/2019 CLINICAL DATA:  Status post cardiac arrest during dialysis. EXAM: PORTABLE CHEST 1 VIEW COMPARISON:  Chest x-ray dated 07/19/2017 FINDINGS: Endotracheal tube in good position 4.7 cm above the carina. Heart size is  normal. Slight bilateral haziness, left greater than right, likely represents mild edema. No consolidative infiltrates or effusions. No bone abnormality. IMPRESSION: 1. Endotracheal tube in good position. 2. Mild pulmonary edema. Electronically Signed   By: Lorriane Shire M.D.   On: 11/28/2019 14:33   EEG adult  Result Date: 11/29/2019 Alexis Goodell, MD     11/29/2019  7:02 PM ELECTROENCEPHALOGRAM REPORT Patient: Trestin Vences       Room #: IC17A-AA EEG No. ID: [id number] Age: 86 y.o.        Sex: male Requesting Physician: Kasa Report Date:  11/29/2019       Interpreting Physician: Alexis Goodell History: Daijon Wenke is an 86 y.o. male s/p arrest Medications: Propofol, Fentanyl, Levophed, Insulin Conditions of Recording:  This is a 21 channel routine scalp EEG performed with bipolar and monopolar montages arranged in accordance to the international 10/20 system of electrode placement. One channel was dedicated to EKG recording. The patient is in the intubated and sedated state. Description:  The background activity is slow and poorly organized.  It consists  of a low voltage polymorphic delta activity that is continuous and diffusely distributed.  No epileptiform activity is noted.  There is no activation of the background rhythm noted with noxious stimuli.  Marland Kitchen  Hyperventilation and intermittent photic stimulation were not performed. IMPRESSION: This is an abnormal EEG secondary to general background slowing.  This finding may be seen with a diffuse cerebral disturbance that is etiologically nonspecific, but may include a medication effect, among other possibilities.  No epileptiform activity was noted.  Background lacked reactivity.  Alexis Goodell, MD Neurology [phone] 11/29/2019, 6:58 PM   ECHOCARDIOGRAM COMPLETE  Result Date: 11/30/2019    ECHOCARDIOGRAM REPORT   Patient Name:   [facility] Heims Date of Exam: 11/30/2019 Medical Rec #:  [id number]     Height:       66.0 in Accession #:    [id number]     Weight:       157.0 lb Date of Birth:  1934-01-01     BSA:          1.804 m Patient Age:    53 years      BP:           114/55 mmHg Patient Gender: M             HR:           98 bpm. Exam Location:  [facility] Procedure: 2D Echo and Intracardiac Opacification Agent Indications:     Cardiac Arrest I46.9  History:         Patient has no prior history of Echocardiogram examinations.  Sonographer:     Arville Go RDCS Referring Phys:  [id number] Awilda Bill Diagnosing Phys: Bartholome Bill MD  Sonographer Comments: Echo performed with patient supine and on artificial respirator and Technically difficult study due to poor echo windows. IMPRESSIONS  1. Left ventricular ejection fraction, by estimation, is 25 to 30%. Left ventricular ejection fraction by PLAX is 26 %. The left ventricle has severely decreased function. The left ventricle has no regional wall motion abnormalities. The left ventricular internal cavity size was moderately dilated. Left ventricular diastolic parameters are consistent with Grade I diastolic dysfunction (impaired relaxation).  2. Right ventricular systolic function is normal. The right ventricular size is mildly enlarged.  3. The mitral valve was not well visualized. Trivial mitral valve regurgitation.  4. The aortic valve was not well visualized. Aortic valve regurgitation is trivial. Mild aortic valve sclerosis is present, with no evidence of aortic valve stenosis. FINDINGS  Left Ventricle: Left ventricular ejection fraction, by estimation, is 25 to 30%. Left ventricular ejection fraction by PLAX is 26 %. The left ventricle has severely decreased function. The left ventricle has no regional wall motion abnormalities. Definity contrast agent was given IV to delineate the left ventricular endocardial borders. The left ventricular internal cavity size was moderately dilated. There is no left ventricular hypertrophy. Left ventricular diastolic parameters are consistent with Grade I diastolic  dysfunction (impaired relaxation). Right Ventricle: The right ventricular size is mildly enlarged. No increase in right ventricular wall thickness. Right ventricular systolic function is normal. Left Atrium: Left atrial size was normal in size. Right Atrium: Right atrial size was normal in size. Pericardium: There is no evidence of pericardial effusion. Mitral Valve: The mitral valve was not well visualized. Trivial mitral valve regurgitation. Tricuspid Valve: The tricuspid valve is not well visualized. Tricuspid valve regurgitation is mild. Aortic Valve: The aortic valve was not well visualized. Aortic valve regurgitation is trivial. Mild aortic valve  sclerosis is present, with no evidence of aortic valve stenosis. Aortic valve peak gradient measures 6.4 mmHg. Pulmonic Valve: The pulmonic valve was not well visualized. Pulmonic valve regurgitation is trivial. Aorta: The aortic root was not well visualized. IAS/Shunts: The interatrial septum was not assessed.  LEFT VENTRICLE PLAX 2D LV EF:         Left            Diastology                ventricular     LV e' lateral:   3.05 cm/s                ejection        LV E/e' lateral: 28.8                fraction by     LV e' medial:    5.00 cm/s                PLAX is 26      LV E/e' medial:  17.6                %. LVIDd:         4.98 cm LVIDs:         4.39 cm LV PW:         1.16 cm LV IVS:        1.11 cm LVOT diam:     2.00 cm LV SV:         43 LV SV Index:   24 LVOT Area:     3.14 cm  LV Volumes (MOD) LV vol d, MOD    182.0 ml A2C: LV vol d, MOD    159.0 ml A4C: LV vol s, MOD    136.0 ml A2C: LV vol s, MOD    126.0 ml A4C: LV SV MOD A2C:   46.0 ml LV SV MOD A4C:   159.0 ml LV SV MOD BP:    38.5 ml RIGHT VENTRICLE RV Basal diam:  3.65 cm RV S prime:     14.00 cm/s TAPSE (M-mode): 2.1 cm LEFT ATRIUM              Index       RIGHT ATRIUM           Index LA diam:        3.40 cm  1.88 cm/m  RA Area:     16.40 cm LA Vol (A2C):   119.0 ml 65.96 ml/m RA Volume:   45.90 ml   25.44 ml/m LA Vol (A4C):   83.3 ml  46.17 ml/m LA Biplane Vol: 99.1 ml  54.93 ml/m  AORTIC VALVE                PULMONIC VALVE AV Area (Vmax): 2.07 cm    PV Vmax:       1.14 m/s AV Vmax:        126.00 cm/s PV Peak grad:  5.2 mmHg AV Peak Grad:   6.4 mmHg LVOT Vmax:      82.90 cm/s LVOT Vmean:     53.500 cm/s LVOT VTI:       0.137 m  AORTA Ao Root diam: 3.60 cm Ao Asc diam:  3.70 cm MITRAL VALVE MV Area (PHT): 4.15 cm     SHUNTS MV Decel Time: 183 msec     Systemic VTI:  0.14 m MV E velocity: 87.80 cm/s   Systemic  Diam: 2.00 cm MV A velocity: 103.00 cm/s MV E/A ratio:  0.85 Bartholome Bill MD Electronically signed by Bartholome Bill MD Signature Date/Time: 11/30/2019/11:41:25 AM    Final     Assessment & Plan    86 year old male with history of end-stage renal disease on hemodialysis, history of laryngeal carcinoma status post tracheostomy, history of hyperlipidemia and sleep apnea who suffered a cardiac arrest during hemodialysis.  1. Cardiac arrest-appears to be in a primary VF arrest. May have been exacerbated by low serum potassium at 2.7. Patient's cardiac markers, high-sensitivity troponin are mildly elevated. Not surprising there is elevation given ventricular arrhythmia and resuscitation from this. Certainly coronary disease may be playing a role however not a candidate for invasive evaluation at present.Echo done today revealed ef of 25-30% which is not changed from his previous echo.We will continue to maintain potassium greater than 4 magnesium greater than 2. Currently on IV amiodarone until able to take p.o.  Again, will remain off of anticoagulation due to bleeding previously and anemia.  Was on Plavix previously empirically for his coronary disease.    2. End-stage renal disease-on hemodialysis. Continue nephrology evaluation and treatment.  Creatinine 6.15 today.  Potassium 4.2.  3. Elevated high-sensitivity troponin-likely secondary to the arrest. Acute coronary event is  possible but given fairly blunted high-sensitivity troponin response, does not appear to be in a primary ischemic event. Wewill remain off of heparin or other anticoagulation/antiplatelet therapy due to bleeding and anemia.. Echo revealed no appreciable change in wall motion or ejection fraction from baseline.  4. Laryngeal carcinoma-stable. Has tracheostomy  5.  Altered mental status-slowly improving.  More animated this morning.  We will continue to follow.  Signed, Javier Docker Stacie Templin MD 12/03/2019, 7:28 AM  Pager: [phone]

## 2019-12-03 NOTE — Progress Notes (Signed)
This note also relates to the following rows which could not be included: Pulse Rate - Cannot attach notes to unvalidated device data Resp - Cannot attach notes to unvalidated device data BP - Cannot attach notes to unvalidated device data  uf off, TO Dr Juleen China

## 2019-12-04 LAB — GLUCOSE, CAPILLARY
Glucose-Capillary: 146 mg/dL — ABNORMAL HIGH (ref 70–99)
Glucose-Capillary: 150 mg/dL — ABNORMAL HIGH (ref 70–99)
Glucose-Capillary: 161 mg/dL — ABNORMAL HIGH (ref 70–99)
Glucose-Capillary: 174 mg/dL — ABNORMAL HIGH (ref 70–99)
Glucose-Capillary: 185 mg/dL — ABNORMAL HIGH (ref 70–99)
Glucose-Capillary: 201 mg/dL — ABNORMAL HIGH (ref 70–99)

## 2019-12-04 LAB — TYPE AND SCREEN
ABO/RH(D): O NEG
Antibody Screen: NEGATIVE
Unit division: 0

## 2019-12-04 LAB — CBC WITH DIFFERENTIAL/PLATELET
Abs Immature Granulocytes: 0.07 10*3/uL (ref 0.00–0.07)
Basophils Absolute: 0 10*3/uL (ref 0.0–0.1)
Basophils Relative: 0 %
Eosinophils Absolute: 0 10*3/uL (ref 0.0–0.5)
Eosinophils Relative: 0 %
HCT: 24.5 % — ABNORMAL LOW (ref 39.0–52.0)
Hemoglobin: 8.9 g/dL — ABNORMAL LOW (ref 13.0–17.0)
Immature Granulocytes: 1 %
Lymphocytes Relative: 6 %
Lymphs Abs: 0.8 10*3/uL (ref 0.7–4.0)
MCH: 32 pg (ref 26.0–34.0)
MCHC: 36.3 g/dL — ABNORMAL HIGH (ref 30.0–36.0)
MCV: 88.1 fL (ref 80.0–100.0)
Monocytes Absolute: 1 10*3/uL (ref 0.1–1.0)
Monocytes Relative: 8 %
Neutro Abs: 10.3 10*3/uL — ABNORMAL HIGH (ref 1.7–7.7)
Neutrophils Relative %: 85 %
Platelets: 173 10*3/uL (ref 150–400)
RBC: 2.78 MIL/uL — ABNORMAL LOW (ref 4.22–5.81)
RDW: 17 % — ABNORMAL HIGH (ref 11.5–15.5)
WBC: 12.2 10*3/uL — ABNORMAL HIGH (ref 4.0–10.5)
nRBC: 2.8 % — ABNORMAL HIGH (ref 0.0–0.2)

## 2019-12-04 LAB — BASIC METABOLIC PANEL
Anion gap: 19 — ABNORMAL HIGH (ref 5–15)
BUN: 44 mg/dL — ABNORMAL HIGH (ref 8–23)
CO2: 29 mmol/L (ref 22–32)
Calcium: 8 mg/dL — ABNORMAL LOW (ref 8.9–10.3)
Chloride: 91 mmol/L — ABNORMAL LOW (ref 98–111)
Creatinine, Ser: 5.07 mg/dL — ABNORMAL HIGH (ref 0.61–1.24)
GFR calc Af Amer: 11 mL/min — ABNORMAL LOW (ref >60)
GFR calc non Af Amer: 10 mL/min — ABNORMAL LOW (ref >60)
Glucose, Bld: 182 mg/dL — ABNORMAL HIGH (ref 70–99)
Potassium: 4 mmol/L (ref 3.5–5.1)
Sodium: 139 mmol/L (ref 135–145)

## 2019-12-04 LAB — BPAM RBC
Blood Product Expiration Date: 202107052359
ISSUE DATE / TIME: 202106291344
Unit Type and Rh: 9500

## 2019-12-04 LAB — PHOSPHORUS: Phosphorus: 5.3 mg/dL — ABNORMAL HIGH (ref 2.5–4.6)

## 2019-12-04 LAB — MAGNESIUM: Magnesium: 2 mg/dL (ref 1.7–2.4)

## 2019-12-04 MED ORDER — MIDODRINE HCL 5 MG PO TABS
5.0000 mg | ORAL_TABLET | Freq: Three times a day (TID) | ORAL | Status: DC
  Administered 2019-12-04 – 2019-12-05 (×5): 5 mg via ORAL
  Filled 2019-12-04 (×5): qty 1

## 2019-12-04 NOTE — Progress Notes (Addendum)
Speech Language Pathology Treatment: Dysphagia  Patient Details Name: Kerry [name] MRN: [id number] DOB: [birth year] Today's Date: 12/04/2019 Time: 4098-1191 SLP Time Calculation (min) (ACUTE ONLY): 50 min  Assessment / Plan / Recommendation Clinical Impression  Pt seen today for ongoing assessment of swallowing and toleration of oral diet. Wife present and endorsed adequate toleration of po's at [employer] meal last night -- not much oral intake overall but "seemed like he swallows at home". No reports of dysphagia or decline in status last shift post Dinner meal in chart. Pt appeared easily fatigued w/ any exertion during po tasks, positioning in bed. Pt continues to appear to present w/ grossly adequate oropharyngeal phase swallow function in light of having had a total laryngectomy in 2016 d/t laryngeal cancer and vocal cord paralysis per chart notes. Pt has been on a Regular diet w/ thin liquids at home at baseline. Pt and Wife endorsed BASELINE mild, overt coughing during meals PRIOR to this illness -- this is known to pt's MDs and Otolaryngologist/SLP over the years as he follows up w/ them every 4 months d/t use of a TEP for verbal communication; monitoring of TEP device and fitting of device. Wife stated they have had discussions on "leakage" around the TEP at office visits w/ his MD/SLP. Most recent [facility] visit notes indicated adequate placement and fitting of the TEP device w/ minimal concern re: leakage.  Oral care completed, and pt positioned upright in bed. Discussed his Baseline swallowing noting the intermittent coughing during meals/with po's. HR 78, RR 20, O2 sats 98-99%. Pt consumed trials of ice chips, thin liquids, purees w/ no immediate, overt clinical s/s of aspiration noted -- no decline in ANS or respiratory effort. Noted noted min audible swallows suspect d/t altered pharyngeal pressure secondary to laryngectomy. Oral phase appeared [facility] for bolus management and oral clearing w/ each  trial. Pt took his time for lingual sweeping and utilized f/u swallows w/ trials. Pt supported w/ feeding d/t UE weakness, shakiness. No overt coughing noted w/ oral intake at this session -- again, pt's Wife stated this occurred at home w/ oral intake/meals "at times". Pt nodded in agreement; he also endorsed that the swallowing seemed and felt "normal" for him during trials given again today.  Discussed the above w/ NSG and MD. Continue to recommend a dysphagia level 2(for ease of pharyngoesophageal swallow) diet w/ added purees for ease and cohesion of textured foods; thin liquids. Recommend general aspiration precautions; Reflux precautions. Pills Crushed in puree as able. Recommend monitoring for any increased coughing during oral intake, or decline in Pulmonary status post meals. ST services will continue to f/u w/ pt and Wife on toleration of oral diet while admitted. Education given to Wife and pt on general precautions and oral care; food consistencies and preparation; monitoring of pt's cues. Options and f/u w/ Dietician for further support. Recommend OT for self-feeding assessment, needs. Wife agreed.      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  Apparently patient lost pulse while having his the most recent dialysis session.  Patient had also developed A. fib RVR and was apneic in the field and required bag mask ventilation.  After arrival to the ER patient again coded x2  with V. fib arrest status post ACLS and ROSC.  Per chart notes, pt recently had signs of pneumonia with respiratory cultures and bronchoscopy done at [facility] with BAL  showing noncryptococcal yeast forms and scattered bacterial cocci negative for AFB.  A trach was placed while pt has been on vent support; now weaned to TC O2 support.      SLP Plan  Continue with current plan of care       Recommendations  Diet recommendations: Dysphagia 2 (fine chop);Dysphagia 1 (puree);Thin liquid Liquids provided via: Cup;Straw Medication Administration: Crushed with puree (for ease of swallowing) Supervision: Staff to assist with self feeding;Full supervision/cueing for compensatory strategies Compensations: Minimize environmental distractions;Slow rate;Small sips/bites;Lingual sweep for clearance of pocketing;Multiple dry swallows after each bite/sip;Follow solids with liquid Postural Changes and/or Swallow Maneuvers: Seated upright 90 degrees;Upright 30-60 min after meal (Reflux precautions)                General recommendations:  (Dietician f/u; OT/PT) Oral Care Recommendations: Oral care BID;Oral care before and after PO;Staff/trained caregiver to provide oral care Follow up Recommendations: None (TBD) SLP Visit Diagnosis: Dysphagia, pharyngoesophageal phase (R13.14) (Esophageal phase) Plan: Continue with current plan of care       GO                 [name], [place], CCC-SLP [name] 12/04/2019, 2:01 PM

## 2019-12-04 NOTE — Progress Notes (Signed)
Placed a 6.0 cuffed trach shiley to ventilate pt. And placed on ventilator after airway secured. Vent started at 1400. Pt. Tolerated all procedures well.

## 2019-12-04 NOTE — Progress Notes (Signed)
PT Cancellation Note  Patient Details Name: Kerry [name] MRN: [id number] DOB: 08/25/1933   Cancelled Treatment:    Reason Eval/Treat Not Completed: Other (comment).  PT consult received.  Chart reviewed.  Discussed pt's status with pt's nurse who cleared pt for physical therapy (plan to do gentle activities/ex's d/t cardiac concerns).  Nurse reporting pt's wife has been requesting physical therapy.  Upon therapy arrival to pt's room, pt's wife verbalizing concerns regarding pt participating in physical therapy (d/t pt's heart condition).  Discussed with pt and pt's wife regarding therapy trialing gentle ex's in bed with monitoring of pt's vitals if pt felt up to participating in physical therapy.  Pt shaking his head no and mouthed that he did not feel up to therapy today but pt agreeable to brief UE assessment per pt's wife's request (pt's wife requesting therapist take a look at pt's UE's d/t weakness concerns before therapist left).  Good B hand grip strength, at least 3/5 AROM B elbow flexion, and AAROM B shoulder flexion to grossly 80 degrees (all with pt resting in bed); pt appeared to tolerate this brief assessment well:  HR 70-74 bpm with  O2 sats 100% on trach collar; NBP 110/60.  Anticipated UE weakness noted from brief assessment.  Will re-attempt PT evaluation at a later date/time as medically appropriate.  Leitha Bleak, PT 12/04/19, 12:45 PM

## 2019-12-04 NOTE — Progress Notes (Signed)
[facility]  ROUNDING NOTE   Subjective:   Hemodialysis treatment yesterday. V -tach on treatment. Required norepinephrine during treatment.   Today wife is at bedside. Patient still on low dose norepinephrine this morning.   Objective:  Vital signs in last 24 hours:  Temp:  [97.5 F (36.4 C)-98.6 F (37 C)] 98.6 F (37 C) (06/30 1200) Pulse Rate:  [55-74] 67 (06/30 1500) Resp:  [11-26] 20 (06/30 1500) BP: (82-119)/(47-77) 86/53 (06/30 1500) SpO2:  [73 %-100 %] 92 % (06/30 1500) FiO2 (%):  [28 %] 28 % (06/30 1000) Weight:  [74.4 kg] 74.4 kg (06/30 0500)  Weight change: -2.7 kg Filed Weights   12/02/19 0500 12/03/19 0446 12/04/19 0500  Weight: 74.1 kg 77.1 kg 74.4 kg    Intake/Output: I/O last 3 completed shifts: In: 657.6 [I.V.:607.6; IV Piggyback:50] Out: 0    Intake/Output this shift:  Total I/O In: 416.1 [I.V.:366.2; IV Piggyback:49.9] Out: 0   Physical Exam: General: Critically ill  Head: Normocephalic, atraumatic. Moist oral mucosal membranes  Eyes: Anicteric, PERRL  Neck: +trach  Lungs:  clear  Heart: Regular rate and rhythm  Abdomen:  Soft, nontender  Extremities: no peripheral edema.  Neurologic: Nonfocal, moving all four extremities  Skin: No lesions  Access: Left AVG    Basic Metabolic Panel: Recent Labs  Lab 11/29/19 0610 11/29/19 1810 11/30/19 0415 11/30/19 0934 11/30/19 1558 11/30/19 1558 12/01/19 0356 12/01/19 0356 12/02/19 0526 12/03/19 0405 12/04/19 0536  NA 148*   < >   < >  --  137  --  137  --  138 138 139  K 4.7   < >   < >  --  3.3*  --  4.2  --  4.1 4.2 4.0  CL 103   < >   < >  --  95*  --  92*  --  89* 88* 91*  CO2 25   < >   < >  --  31  --  33*  --  34* 33* 29  GLUCOSE 326*   < >   < >  --  129*  --  205*  --  165* 165* 182*  BUN 34*   < >   < >  --  12  --  24*  --  35* 52* 44*  CREATININE 4.97*   < >   < >  --  1.98*  --  3.91*  --  5.14* 6.15* 5.07*  CALCIUM 8.0*   < >   < >  --  7.5*   < > 7.4*   < >  7.9* 8.1* 8.0*  MG 2.2  --   --   --  1.8  --   --   --  1.7 2.0 2.0  PHOS 5.4*   < >  --  3.3  --   --  2.4*  --  2.9 5.0* 5.3*   < > = values in this interval not displayed.    Liver Function Tests: Recent Labs  Lab 11/28/19 1331 11/28/19 1638 11/30/19 0415 12/01/19 0356 12/02/19 0526  AST 343* 386*  --   --   --   ALT 300* 315*  --   --   --   ALKPHOS 65 51  --   --   --   BILITOT 1.5* 1.3*  --   --   --   PROT 5.8* 4.2*  --   --   --  ALBUMIN 3.3* 2.3* 2.8* 2.6* 2.7*   No results for input(s): LIPASE, AMYLASE in the last 168 hours. No results for input(s): AMMONIA in the last 168 hours.  CBC: Recent Labs  Lab 11/28/19 1331 11/28/19 1331 11/29/19 0610 11/30/19 0415 12/01/19 0356 12/02/19 0526 12/03/19 0405 12/03/19 1501 12/04/19 0536  WBC 10.1   < > 20.0*   < > 7.9 10.0 9.5 12.7* 12.2*  NEUTROABS 6.9  --  18.6*  --   --   --   --   --  10.3*  HGB 9.2*   < > 7.4*   < > 7.4* 7.2* 6.9* 8.6* 8.9*  HCT 25.8*   < > 20.7*   < > 20.1* 19.3* 19.2* 23.4* 24.5*  MCV 92.1   < > 90.4   < > 86.6 86.5 90.6 84.8 88.1  PLT 154   < > 139*   < > 132* 157 170 172 173   < > = values in this interval not displayed.    Cardiac Enzymes: No results for input(s): CKTOTAL, CKMB, CKMBINDEX, TROPONINI in the last 168 hours.  BNP: Invalid input(s): POCBNP  CBG: Recent Labs  Lab 12/03/19 2345 12/04/19 0355 12/04/19 0706 12/04/19 1137 12/04/19 1521  GLUCAP 155* 150* 161* 174* 146*    Microbiology: Results for orders placed or performed during the hospital encounter of 11/28/19  Culture, blood (routine x 2)     Status: None   Collection Time: 11/28/19  1:31 PM   Specimen: BLOOD  Result Value Ref Range Status   Specimen Description BLOOD BLOOD RIGHT ARM  Final   Special Requests   Final    BOTTLES DRAWN AEROBIC AND ANAEROBIC Blood Culture adequate volume   Culture   Final    NO GROWTH 5 DAYS Performed at [facility], [address] [place], [place] [address]     Report Status 12/03/2019 FINAL  Final  Culture, blood (routine x 2)     Status: None   Collection Time: 11/28/19  2:17 PM   Specimen: BLOOD  Result Value Ref Range Status   Specimen Description BLOOD BLOOD RIGHT WRIST  Final   Special Requests   Final    BOTTLES DRAWN AEROBIC AND ANAEROBIC Blood Culture adequate volume   Culture   Final    NO GROWTH 5 DAYS Performed at [facility], [address], [place], [place] [address]    Report Status 12/03/2019 FINAL  Final  SARS Coronavirus 2 by RT PCR (hospital order, performed in [facility] hospital lab) Nasopharyngeal Nasopharyngeal Swab     Status: None   Collection Time: 11/28/19  4:40 PM   Specimen: Nasopharyngeal Swab  Result Value Ref Range Status   SARS Coronavirus 2 NEGATIVE NEGATIVE Final    Comment: (NOTE) SARS-CoV-2 target nucleic acids are NOT DETECTED.  The SARS-CoV-2 RNA is generally detectable in upper and lower respiratory specimens during the acute phase of infection. The lowest concentration of SARS-CoV-2 viral copies this assay can detect is 250 copies / mL. A negative result does not preclude SARS-CoV-2 infection and should not be used as the sole basis for treatment or other patient management decisions.  A negative result may occur with improper specimen collection / handling, submission of specimen other than nasopharyngeal swab, presence of viral mutation(s) within the areas targeted by this assay, and inadequate number of viral copies (<250 copies / mL). A negative result must be combined with clinical observations, patient history, and epidemiological information.  Fact Sheet  for Patients:   StrictlyIdeas.no  Fact Sheet for Healthcare Providers: BankingDealers.co.za  This test is not yet approved or  cleared by the [place] FDA and has been authorized for detection and/or diagnosis of SARS-CoV-2 by FDA under an Emergency Use Authorization  (EUA).  This EUA will remain in effect (meaning this test can be used) for the duration of the COVID-19 declaration under Section 564(b)(1) of the Act, 21 U.S.C. section 360bbb-3(b)(1), unless the authorization is terminated or revoked sooner.  Performed at [facility], [address] [place], [place] [address]   MRSA PCR Screening     Status: None   Collection Time: 11/28/19  9:08 PM   Specimen: Nasopharyngeal  Result Value Ref Range Status   MRSA by PCR NEGATIVE NEGATIVE Final    Comment:        The GeneXpert MRSA Assay (FDA approved for NASAL specimens only), is one component of a comprehensive MRSA colonization surveillance program. It is not intended to diagnose MRSA infection nor to guide or monitor treatment for MRSA infections. Performed at [facility], [address] [place], [place] [address]     Coagulation Studies: No results for input(s): LABPROT, INR in the last 72 hours.  Urinalysis: No results for input(s): COLORURINE, LABSPEC, PHURINE, GLUCOSEU, HGBUR, BILIRUBINUR, KETONESUR, PROTEINUR, UROBILINOGEN, NITRITE, LEUKOCYTESUR in the last 72 hours.  Invalid input(s): APPERANCEUR    Imaging: No results found.   Medications:   . sodium chloride    . sodium chloride Stopped (11/28/19 2021)  . sodium chloride    . sodium chloride    . amiodarone 30 mg/hr (12/04/19 1500)  . famotidine (PEPCID) IV Stopped (12/04/19 7673)  . norepinephrine (LEVOPHED) Adult infusion Stopped (12/03/19 1646)   . sodium chloride   Intravenous Once  . chlorhexidine gluconate (MEDLINE KIT)  15 mL Mouth Rinse BID  . Chlorhexidine Gluconate Cloth  6 each Topical Q0600  . [START ON 12/05/2019] epoetin (EPOGEN/PROCRIT) injection  10,000 Units Intravenous Q T,Th,Sa-HD  . feeding supplement (NEPRO CARB STEADY)  237 mL Oral BID BM  . insulin aspart  0-6 Units Subcutaneous Q4H  . mouth rinse  15 mL Mouth Rinse 10 times per day  . midazolam  2 mg Intravenous Once   . midodrine  5 mg Oral TID WC  . multivitamin  1 tablet Oral QHS  . polyethylene glycol  17 g Oral Daily   sodium chloride, sodium chloride, alteplase, bisacodyl, fentaNYL (SUBLIMAZE) injection, heparin, lidocaine (PF), lidocaine-prilocaine, midazolam, midazolam, ondansetron (ZOFRAN) IV, pentafluoroprop-tetrafluoroeth, polyethylene glycol  Assessment/ Plan:  [name] is a 86 y.o. 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 End-stage renal disease: dialysis for tomorrow. Monitor potassium  2. Hypokalemia: has a history of hyperkalemia. Unclear cause of hypokalemia.  Consider restarting ARB/ACE-I to help increase serum potassium levels when blood pressure allows  3. Hypotension: with recent cardiac arrest. Holding home regimen of amlodipnie, hydralazine, isosorbide mononitrate, torsemide, and metoprolol.  Currently requiring norepinephrine.  - midodrine  4. Anemia with chronic kidney disease: receives Mircera as outpatient.  PRBC transfusion yesterday.  - EPO with HD treatment.  LOS: 6 Carolos Fecher 6/30/20214:15 PM

## 2019-12-04 NOTE — Progress Notes (Signed)
CRITICAL CARE NOTE  Patient with advanced CHF and COPD, three-vessel CAD, sleep apnea, diverticulosis, end-stage renal failure on dialysis, squamous cell CA of upper and lower airway with vocal cord paralysis, status post laryngectomy with tracheostomy status who recently had signs of pneumonia with respiratory cultures and bronchoscopy done at [facility] with BAL showing noncryptococcal yeast forms and scattered bacterial cocci negative for AFB, came in after cardiac arrest.   Apparently patient lost pulse while having his the most recent dialysis session. Patient had also developed A. fib RVR and was apneic in the field and required bag mask ventilation. After arrival to the ER patient again coded x2 with V. fib arrest status post ACLS and ROSC, found to be amiodarone and 20 MCG Levophed.wifeat bedside.  Lines / Drains: Left upper extremity HD access, I/O positive  Cultures / Sepsis markers: Tracheal aspirate  Antibiotics: Vancomycin and cefepime   Protocols / Consultants: PCCM  Tests / Events: 6/24 prolonged CARDIAC ARREST 6/24 HYPOTHERMIA PROTOCOL 6/25 cardiogenic shock on vent and pressors 6/26 increased pressors for HD 6/27 remains on vent, +delerium, +pressors 6/28-6/29 off vent, alert and awake, on amiodarone 6/30 remains off vent, on  amio  CC  follow up respiratory failure  SUBJECTIVE Prognosis is guarded Off vent  Off pressors Alert and awake    BP (!) 95/49   Pulse 69   Temp 98.1 F (36.7 C) (Oral)   Resp 13   Ht 5\' 6"  (1.676 m)   Wt 74.4 kg   SpO2 99%   BMI 26.47 kg/m    I/O last 3 completed shifts: In: 657.6 [I.V.:607.6; IV Piggyback:50] Out: 0  No intake/output data recorded.  SpO2: 99 % O2 Flow Rate (L/min): 5 L/min FiO2 (%): 28 %  Estimated body mass index is 26.47 kg/m as calculated from the following:   Height as of this encounter: 5\' 6"  (1.676 m).   Weight as of this encounter: 74.4 kg.  ROS  limited No pain No NVD No SOB   Physical Examination:   General Appearance: No distress  Neuro:without focal findings,  HEENT: PERRLA, EOM intact.  S/p t rach Pulmonary: normal breath sounds, No wheezing.  CardiovascularNormal S1,S2.  No m/r/g.   Abdomen: Benign, Soft, non-tender. Renal:  No costovertebral tenderness  GU:  Not performed at this time. Endoc: No evident thyromegaly Skin:   warm, no rashes, no ecchymosis  Extremities: normal, no cyanosis, clubbing. PSYCHIATRIC: Mood, affect within normal limits.   ALL OTHER ROS ARE NEGATIVE   MEDICATIONS: I have reviewed all medications and confirmed regimen as documented   CULTURE RESULTS   Recent Results (from the past 240 hour(s))  Culture, blood (routine x 2)     Status: None   Collection Time: 11/28/19  1:31 PM   Specimen: BLOOD  Result Value Ref Range Status   Specimen Description BLOOD BLOOD RIGHT ARM  Final   Special Requests   Final    BOTTLES DRAWN AEROBIC AND ANAEROBIC Blood Culture adequate volume   Culture   Final    NO GROWTH 5 DAYS Performed at [facility], [address] [place], [place] [address]    Report Status 12/03/2019 FINAL  Final  Culture, blood (routine x 2)     Status: None   Collection Time: 11/28/19  2:17 PM   Specimen: BLOOD  Result Value Ref Range Status   Specimen Description BLOOD BLOOD RIGHT WRIST  Final   Special Requests   Final    BOTTLES  DRAWN AEROBIC AND ANAEROBIC Blood Culture adequate volume   Culture   Final    NO GROWTH 5 DAYS Performed at [facility], [address] [place], [place] [address]    Report Status 12/03/2019 FINAL  Final  SARS Coronavirus 2 by RT PCR (hospital order, performed in [facility] hospital lab) Nasopharyngeal Nasopharyngeal Swab     Status: None   Collection Time: 11/28/19  4:40 PM   Specimen: Nasopharyngeal Swab  Result Value Ref Range Status   SARS Coronavirus 2 NEGATIVE NEGATIVE Final    Comment: (NOTE) SARS-CoV-2  target nucleic acids are NOT DETECTED.  The SARS-CoV-2 RNA is generally detectable in upper and lower respiratory specimens during the acute phase of infection. The lowest concentration of SARS-CoV-2 viral copies this assay can detect is 250 copies / mL. A negative result does not preclude SARS-CoV-2 infection and should not be used as the sole basis for treatment or other patient management decisions.  A negative result may occur with improper specimen collection / handling, submission of specimen other than nasopharyngeal swab, presence of viral mutation(s) within the areas targeted by this assay, and inadequate number of viral copies (<250 copies / mL). A negative result must be combined with clinical observations, patient history, and epidemiological information.  Fact Sheet for Patients:   StrictlyIdeas.no  Fact Sheet for Healthcare Providers: BankingDealers.co.za  This test is not yet approved or  cleared by the [place] FDA and has been authorized for detection and/or diagnosis of SARS-CoV-2 by FDA under an Emergency Use Authorization (EUA).  This EUA will remain in effect (meaning this test can be used) for the duration of the COVID-19 declaration under Section 564(b)(1) of the Act, 21 U.S.C. section 360bbb-3(b)(1), unless the authorization is terminated or revoked sooner.  Performed at [facility], [address] [place], [place] [address]   MRSA PCR Screening     Status: None   Collection Time: 11/28/19  9:08 PM   Specimen: Nasopharyngeal  Result Value Ref Range Status   MRSA by PCR NEGATIVE NEGATIVE Final    Comment:        The GeneXpert MRSA Assay (FDA approved for NASAL specimens only), is one component of a comprehensive MRSA colonization surveillance program. It is not intended to diagnose MRSA infection nor to guide or monitor treatment for MRSA infections. Performed at [facility],  [address] [place], [place] [address]              ASSESSMENT AND PLAN SYNOPSIS   Severe ACUTE Hypoxic and Hypercapnic Respiratory Failure resolved Oxygen as needed TCT as tolerated Now has cuffless trach in place for pulmonary tiolet S/p laryngectomy   ACUTE SYSTOLIC CARDIAC FAILURE- EF 20% afib with RVR -follow up cardiology recs   END STAGE KIDNEY INJURY/Renal Failure HD as needed   CARDIAC ICU monitoring   GI GI PROPHYLAXIS as indicated  DIET--> as tolerated Constipation protocol as indicated Speech following  ENDO - will use ICU hypoglycemic\Hyperglycemia protocol if indicated     ELECTROLYTES -follow labs as needed -replace as needed -pharmacy consultation and following   DVT/GI PRX ordered and assessed TRANSFUSIONS AS NEEDED MONITOR FSBS I Assessed the need for Labs I Assessed the need for Foley I Assessed the need for Central Venous Line Family Discussion when available I Assessed the need for Mobilization I made an Assessment of medications to be adjusted accordingly Safety Risk assessment completed   CASE DISCUSSED IN [facility] ICU TEAM  Transfer to [facility] service in 7/1  Corrin Parker, M.D.  [facility]  Medical Director [place] Director [facility] Cardio-Pulmonary Department

## 2019-12-05 LAB — BASIC METABOLIC PANEL
Anion gap: 18 — ABNORMAL HIGH (ref 5–15)
BUN: 58 mg/dL — ABNORMAL HIGH (ref 8–23)
CO2: 30 mmol/L (ref 22–32)
Calcium: 8 mg/dL — ABNORMAL LOW (ref 8.9–10.3)
Chloride: 92 mmol/L — ABNORMAL LOW (ref 98–111)
Creatinine, Ser: 6.04 mg/dL — ABNORMAL HIGH (ref 0.61–1.24)
GFR calc Af Amer: 9 mL/min — ABNORMAL LOW (ref >60)
GFR calc non Af Amer: 8 mL/min — ABNORMAL LOW (ref >60)
Glucose, Bld: 158 mg/dL — ABNORMAL HIGH (ref 70–99)
Potassium: 3.4 mmol/L — ABNORMAL LOW (ref 3.5–5.1)
Sodium: 140 mmol/L (ref 135–145)

## 2019-12-05 LAB — CBC WITH DIFFERENTIAL/PLATELET
Abs Immature Granulocytes: 0.17 10*3/uL — ABNORMAL HIGH (ref 0.00–0.07)
Basophils Absolute: 0 10*3/uL (ref 0.0–0.1)
Basophils Relative: 0 %
Eosinophils Absolute: 0 10*3/uL (ref 0.0–0.5)
Eosinophils Relative: 0 %
HCT: 27.5 % — ABNORMAL LOW (ref 39.0–52.0)
Hemoglobin: 10.2 g/dL — ABNORMAL LOW (ref 13.0–17.0)
Immature Granulocytes: 1 %
Lymphocytes Relative: 8 %
Lymphs Abs: 1 10*3/uL (ref 0.7–4.0)
MCH: 31.8 pg (ref 26.0–34.0)
MCHC: 37.1 g/dL — ABNORMAL HIGH (ref 30.0–36.0)
MCV: 85.7 fL (ref 80.0–100.0)
Monocytes Absolute: 1 10*3/uL (ref 0.1–1.0)
Monocytes Relative: 8 %
Neutro Abs: 11.3 10*3/uL — ABNORMAL HIGH (ref 1.7–7.7)
Neutrophils Relative %: 83 %
Platelets: 210 10*3/uL (ref 150–400)
RBC: 3.21 MIL/uL — ABNORMAL LOW (ref 4.22–5.81)
RDW: 16.7 % — ABNORMAL HIGH (ref 11.5–15.5)
Smear Review: NORMAL
WBC: 13.6 10*3/uL — ABNORMAL HIGH (ref 4.0–10.5)
nRBC: 4.9 % — ABNORMAL HIGH (ref 0.0–0.2)

## 2019-12-05 LAB — GLUCOSE, CAPILLARY
Glucose-Capillary: 140 mg/dL — ABNORMAL HIGH (ref 70–99)
Glucose-Capillary: 133 mg/dL — ABNORMAL HIGH (ref 70–99)
Glucose-Capillary: 196 mg/dL — ABNORMAL HIGH (ref 70–99)
Glucose-Capillary: 103 mg/dL — ABNORMAL HIGH (ref 70–99)
Glucose-Capillary: 217 mg/dL — ABNORMAL HIGH (ref 70–99)

## 2019-12-05 LAB — MAGNESIUM: Magnesium: 2.1 mg/dL (ref 1.7–2.4)

## 2019-12-05 LAB — PHOSPHORUS: Phosphorus: 5.2 mg/dL — ABNORMAL HIGH (ref 2.5–4.6)

## 2019-12-05 LAB — PATHOLOGIST SMEAR REVIEW

## 2019-12-05 MED ORDER — SODIUM CHLORIDE 0.9% FLUSH
10.0000 mL | INTRAVENOUS | Status: DC | PRN
  Administered 2019-12-11: 10 mL

## 2019-12-05 MED ORDER — SODIUM CHLORIDE 0.9% FLUSH
10.0000 mL | Freq: Two times a day (BID) | INTRAVENOUS | Status: DC
  Administered 2019-12-05 – 2019-12-16 (×12): 10 mL

## 2019-12-05 MED ORDER — CHLORHEXIDINE GLUCONATE 0.12 % MT SOLN
OROMUCOSAL | Status: AC
  Filled 2019-12-05: qty 15

## 2019-12-05 NOTE — Progress Notes (Signed)
PT Cancellation Note  Patient Details Name: Kerry [name] MRN: [id number] DOB: 1934-01-06   Cancelled Treatment:    Reason Eval/Treat Not Completed: Other (comment).  Pt currently receiving dialysis.  Will re-attempt PT evaluation at a later date/time as medically appropriate.  Leitha Bleak, PT 12/05/19, 12:35 PM

## 2019-12-05 NOTE — Progress Notes (Signed)
Patient Name: Kerry [name] Date of Encounter: 12/05/2019  Hospital Problem List     Active Problems:   Cardiac arrest [facility])    Patient Profile     86 y.o.malewith history of86 y.o.male patient who has a history of diabetes, hypertension,laryngeal carcinoma s/p tracheostomy,end-stage kidney disease currently on hemodialysis, hyperlipidemia and sleep apnea who was admitted after suffering a cardiac arrest during hemodialysis. Was in vf per report in dialysis and had two more cardiac arrest episodes while in the er. He currently is in the code ice protocol and is on iv amiodarone, iv levophed. He has a fairly complicated past medical history including he has undergone placement of a tracheostomy due to laryngeal carcinoma and resultant laryngectomy.  Mental status is gradually improving.  Subjective   Currently receiving HD.  Inpatient Medications     sodium chloride   Intravenous Once   chlorhexidine gluconate (MEDLINE KIT)  15 mL Mouth Rinse BID   Chlorhexidine Gluconate Cloth  6 each Topical Q0600   epoetin (EPOGEN/PROCRIT) injection  10,000 Units Intravenous Q T,Th,Sa-HD   feeding supplement (NEPRO CARB STEADY)  237 mL Oral BID BM   insulin aspart  0-6 Units Subcutaneous Q4H   mouth rinse  15 mL Mouth Rinse 10 times per day   midazolam  2 mg Intravenous Once   midodrine  5 mg Oral TID WC   multivitamin  1 tablet Oral QHS   polyethylene glycol  17 g Oral Daily   sodium chloride flush  10-40 mL Intracatheter Q12H    Vital Signs    Vitals:   12/05/19 0400 12/05/19 0454 12/05/19 0500 12/05/19 0600  BP: (!) 89/52  122/61 (!) 100/54  Pulse: 72  66 80  Resp: 15  (!) 28 17  Temp: 97.7 F (36.5 C)     TempSrc: Oral     SpO2: 100%  100% 99%  Weight:  72.9 kg    Height:        Intake/Output Summary (Last 24 hours) at 12/05/2019 0736 Last data filed at 12/04/2019 1700 Gross per 24 hour  Intake 449.47 ml  Output 0 ml  Net 449.47 ml   Filed Weights    12/03/19 0446 12/04/19 0500 12/05/19 0454  Weight: 77.1 kg 74.4 kg 72.9 kg    Physical Exam    GEN: Well nourished, well developed, in no acute distress.  HEENT: normal.  Neck: Supple, no JVD, carotid bruits, or masses. Cardiac: RRR, no murmurs, rubs, or gallops. No clubbing, cyanosis, edema.  Radials/DP/PT 2+ and equal bilaterally.  Respiratory:  Respirations regular and unlabored, clear to auscultation bilaterally. GI: Soft, nontender, nondistended, BS + x 4. MS: no deformity or atrophy. Skin: warm and dry, no rash. Neuro:  Strength and sensation are intact. Psych: Normal affect.  Labs    CBC Recent Labs    12/04/19 0536 12/05/19 0507  WBC 12.2* 13.6*  NEUTROABS 10.3* 11.3*  HGB 8.9* 10.2*  HCT 24.5* 27.5*  MCV 88.1 85.7  PLT 173 976   Basic Metabolic Panel Recent Labs    12/04/19 0536 12/05/19 0507  NA 139 140  K 4.0 3.4*  CL 91* 92*  CO2 29 30  GLUCOSE 182* 158*  BUN 44* 58*  CREATININE 5.07* 6.04*  CALCIUM 8.0* 8.0*  MG 2.0 2.1  PHOS 5.3* 5.2*   Liver Function Tests No results for input(s): AST, ALT, ALKPHOS, BILITOT, PROT, ALBUMIN in the last 72 hours. No results for input(s): LIPASE, AMYLASE in the last 72  hours. Cardiac Enzymes No results for input(s): CKTOTAL, CKMB, CKMBINDEX, TROPONINI in the last 72 hours. BNP No results for input(s): BNP in the last 72 hours. D-Dimer No results for input(s): DDIMER in the last 72 hours. Hemoglobin A1C No results for input(s): HGBA1C in the last 72 hours. Fasting Lipid Panel No results for input(s): CHOL, HDL, LDLCALC, TRIG, CHOLHDL, LDLDIRECT in the last 72 hours. Thyroid Function Tests No results for input(s): TSH, T4TOTAL, T3FREE, THYROIDAB in the last 72 hours.  Invalid input(s): FREET3  Telemetry    nsr  ECG    nsr  Radiology    CT HEAD WO CONTRAST  Result Date: 11/28/2019 CLINICAL DATA:  Status post cardiac arrest. EXAM: CT HEAD WITHOUT CONTRAST TECHNIQUE: Contiguous axial images were  obtained from the base of the skull through the vertex without intravenous contrast. COMPARISON:  None. FINDINGS: Brain: There is mild cerebral atrophy with widening of the extra-axial spaces and ventricular dilatation. There are areas of decreased attenuation within the white matter tracts of the supratentorial brain, consistent with microvascular disease changes. Vascular: No hyperdense vessel or unexpected calcification. Skull: Normal. Negative for fracture or focal lesion. Sinuses/Orbits: No acute finding. Other: None. IMPRESSION: 1. Generalized cerebral atrophy. 2. No acute intracranial abnormality. Electronically Signed   By: Virgina Norfolk M.D.   On: 11/28/2019 18:06   DG Chest Port 1 View  Result Date: 12/02/2019 CLINICAL DATA:  Acute respiratory failure. EXAM: PORTABLE CHEST 1 VIEW COMPARISON:  11/30/2019 FINDINGS: The tracheostomy tube is stable. External pacer paddles are again noted. Stable mild cardiac enlargement and persistent lower lobe predominant airspace process and bilateral pleural effusions. IMPRESSION: Persistent lower lobe predominant airspace process and bilateral pleural effusions. Electronically Signed   By: Marijo Sanes M.D.   On: 12/02/2019 07:23   DG Chest Port 1 View  Result Date: 11/30/2019 CLINICAL DATA:  Acute respiratory failure EXAM: PORTABLE CHEST 1 VIEW COMPARISON:  11/28/2019 FINDINGS: Single frontal view of the chest demonstrates tracheostomy tube overlying tracheal air column tip at thoracic inlet. External defibrillator pads are again noted. Cardiac silhouette is unremarkable. Since the prior exam, there is decreased interstitial prominence. Persistent central vascular congestion with trace right pleural effusion. No pneumothorax. IMPRESSION: 1. Persistent central vascular congestion and trace right pleural effusion, with improving interstitial edema since prior exam. Electronically Signed   By: Randa Ngo M.D.   On: 11/30/2019 03:14   DG Chest Portable 1  View  Result Date: 11/28/2019 CLINICAL DATA:  Status post cardiac arrest during dialysis. EXAM: PORTABLE CHEST 1 VIEW COMPARISON:  Chest x-ray dated 07/19/2017 FINDINGS: Endotracheal tube in good position 4.7 cm above the carina. Heart size is normal. Slight bilateral haziness, left greater than right, likely represents mild edema. No consolidative infiltrates or effusions. No bone abnormality. IMPRESSION: 1. Endotracheal tube in good position. 2. Mild pulmonary edema. Electronically Signed   By: Lorriane Shire M.D.   On: 11/28/2019 14:33   EEG adult  Result Date: 11/29/2019 Alexis Goodell, MD     11/29/2019  7:02 PM ELECTROENCEPHALOGRAM REPORT Patient: Arthor Gorter       Room #: IC17A-AA EEG No. ID: [id number] Age: 86 y.o.        Sex: male Requesting Physician: Kasa Report Date:  11/29/2019       Interpreting Physician: Alexis Goodell History: Shinichi Anguiano is an 86 y.o. male s/p arrest Medications: Propofol, Fentanyl, Levophed, Insulin Conditions of Recording:  This is a 21 channel routine scalp EEG performed with bipolar and monopolar montages  arranged in accordance to the international 10/20 system of electrode placement. One channel was dedicated to EKG recording. The patient is in the intubated and sedated state. Description:  The background activity is slow and poorly organized.  It consists of a low voltage polymorphic delta activity that is continuous and diffusely distributed.  No epileptiform activity is noted.  There is no activation of the background rhythm noted with noxious stimuli.  Marland Kitchen  Hyperventilation and intermittent photic stimulation were not performed. IMPRESSION: This is an abnormal EEG secondary to general background slowing.  This finding may be seen with a diffuse cerebral disturbance that is etiologically nonspecific, but may include a medication effect, among other possibilities.  No epileptiform activity was noted.  Background lacked reactivity.  Alexis Goodell, MD Neurology  [phone] 11/29/2019, 6:58 PM   ECHOCARDIOGRAM COMPLETE  Result Date: 11/30/2019    ECHOCARDIOGRAM REPORT   Patient Name:   [facility] Kegel Date of Exam: 11/30/2019 Medical Rec #:  [id number]     Height:       66.0 in Accession #:    [id number]    Weight:       157.0 lb Date of Birth:  24-Aug-1933     BSA:          1.804 m Patient Age:    59 years      BP:           114/55 mmHg Patient Gender: M             HR:           98 bpm. Exam Location:  [facility] Procedure: 2D Echo and Intracardiac Opacification Agent Indications:     Cardiac Arrest I46.9  History:         Patient has no prior history of Echocardiogram examinations.  Sonographer:     Arville Go RDCS Referring Phys:  [id number] Awilda Bill Diagnosing Phys: Bartholome Bill MD  Sonographer Comments: Echo performed with patient supine and on artificial respirator and Technically difficult study due to poor echo windows. IMPRESSIONS  1. Left ventricular ejection fraction, by estimation, is 25 to 30%. Left ventricular ejection fraction by PLAX is 26 %. The left ventricle has severely decreased function. The left ventricle has no regional wall motion abnormalities. The left ventricular internal cavity size was moderately dilated. Left ventricular diastolic parameters are consistent with Grade I diastolic dysfunction (impaired relaxation).  2. Right ventricular systolic function is normal. The right ventricular size is mildly enlarged.  3. The mitral valve was not well visualized. Trivial mitral valve regurgitation.  4. The aortic valve was not well visualized. Aortic valve regurgitation is trivial. Mild aortic valve sclerosis is present, with no evidence of aortic valve stenosis. FINDINGS  Left Ventricle: Left ventricular ejection fraction, by estimation, is 25 to 30%. Left ventricular ejection fraction by PLAX is 26 %. The left ventricle has severely decreased function. The left ventricle has no regional wall motion abnormalities. Definity contrast agent was given  IV to delineate the left ventricular endocardial borders. The left ventricular internal cavity size was moderately dilated. There is no left ventricular hypertrophy. Left ventricular diastolic parameters are consistent with Grade I diastolic dysfunction (impaired relaxation). Right Ventricle: The right ventricular size is mildly enlarged. No increase in right ventricular wall thickness. Right ventricular systolic function is normal. Left Atrium: Left atrial size was normal in size. Right Atrium: Right atrial size was normal in size. Pericardium: There is no evidence of pericardial effusion. Mitral Valve: The mitral  valve was not well visualized. Trivial mitral valve regurgitation. Tricuspid Valve: The tricuspid valve is not well visualized. Tricuspid valve regurgitation is mild. Aortic Valve: The aortic valve was not well visualized. Aortic valve regurgitation is trivial. Mild aortic valve sclerosis is present, with no evidence of aortic valve stenosis. Aortic valve peak gradient measures 6.4 mmHg. Pulmonic Valve: The pulmonic valve was not well visualized. Pulmonic valve regurgitation is trivial. Aorta: The aortic root was not well visualized. IAS/Shunts: The interatrial septum was not assessed.  LEFT VENTRICLE PLAX 2D LV EF:         Left            Diastology                ventricular     LV e' lateral:   3.05 cm/s                ejection        LV E/e' lateral: 28.8                fraction by     LV e' medial:    5.00 cm/s                PLAX is 26      LV E/e' medial:  17.6                %. LVIDd:         4.98 cm LVIDs:         4.39 cm LV PW:         1.16 cm LV IVS:        1.11 cm LVOT diam:     2.00 cm LV SV:         43 LV SV Index:   24 LVOT Area:     3.14 cm  LV Volumes (MOD) LV vol d, MOD    182.0 ml A2C: LV vol d, MOD    159.0 ml A4C: LV vol s, MOD    136.0 ml A2C: LV vol s, MOD    126.0 ml A4C: LV SV MOD A2C:   46.0 ml LV SV MOD A4C:   159.0 ml LV SV MOD BP:    38.5 ml RIGHT VENTRICLE RV Basal diam:   3.65 cm RV S prime:     14.00 cm/s TAPSE (M-mode): 2.1 cm LEFT ATRIUM              Index       RIGHT ATRIUM           Index LA diam:        3.40 cm  1.88 cm/m  RA Area:     16.40 cm LA Vol (A2C):   119.0 ml 65.96 ml/m RA Volume:   45.90 ml  25.44 ml/m LA Vol (A4C):   83.3 ml  46.17 ml/m LA Biplane Vol: 99.1 ml  54.93 ml/m  AORTIC VALVE                PULMONIC VALVE AV Area (Vmax): 2.07 cm    PV Vmax:       1.14 m/s AV Vmax:        126.00 cm/s PV Peak grad:  5.2 mmHg AV Peak Grad:   6.4 mmHg LVOT Vmax:      82.90 cm/s LVOT Vmean:     53.500 cm/s LVOT VTI:       0.137 m  AORTA Ao Root diam: 3.60 cm  Ao Asc diam:  3.70 cm MITRAL VALVE MV Area (PHT): 4.15 cm     SHUNTS MV Decel Time: 183 msec     Systemic VTI:  0.14 m MV E velocity: 87.80 cm/s   Systemic Diam: 2.00 cm MV A velocity: 103.00 cm/s MV E/A ratio:  0.85 Bartholome Bill MD Electronically signed by Bartholome Bill MD Signature Date/Time: 11/30/2019/11:41:25 AM    Final     Assessment & Plan    86 year old male with history of end-stage renal disease on hemodialysis, history of laryngeal carcinoma status post tracheostomy, history of hyperlipidemia and sleep apnea who suffered a cardiac arrest during hemodialysis.  1. Cardiac arrest-appears to be in a primary VF arrest. May have been exacerbated by low serum potassium at 2.7. Patient's cardiac markers, high-sensitivity troponin are mildly elevated. Not surprising there is elevation given ventricular arrhythmia and resuscitation from this. Certainly coronary disease may be playing a role however not a candidate for invasive evaluation at present.Echo done this admission revealed ef of 25-30% which is not changed from his previous echo.We will continue to maintain potassium greater than 4 magnesium greater than 2. Currently on IV amiodarone until able to take p.o.  Again, will remain off of anticoagulation due to bleeding previously and anemia. Was on Plavix previously empirically for his  coronary disease. Has runs of afib with rvr/vt on hd. Has resonded to bolus of amiodarone. Will continue with amiodarone.   2. End-stage renal disease-on hemodialysis. Continue nephrology evaluation and treatment.Creatinine 6.15 today. Potassium 4.2.  3. Elevated high-sensitivity troponin-likely secondary to the arrest. Acute coronary event is possible but given fairly blunted high-sensitivity troponin response, does not appear to be in a primary ischemic event. Wewill remain off of heparin or other anticoagulation/antiplatelet therapy due to bleeding and anemia.. Echo revealed no appreciable change in wall motion or ejection fraction from baseline.  4. Laryngeal carcinoma-stable. Has tracheostomy  5. Altered mental status-slowly improving. More animated this morning. We will continue to follow.  Signed, Javier Docker Fath MD 12/05/2019, 7:36 AM  Pager: [phone]

## 2019-12-05 NOTE — Progress Notes (Signed)
[facility]  ROUNDING NOTE   Subjective:   Hemodialysis for today. Wife at bedside.   Remains on low dose norepinephrine and amiodarone gtt  Started on midodrine yesterday.   Objective:  Vital signs in last 24 hours:  Temp:  [97.4 F (36.3 C)-98.6 F (37 C)] 97.4 F (36.3 C) (07/01 0900) Pulse Rate:  [60-80] 69 (07/01 0900) Resp:  [12-35] 25 (07/01 0900) BP: (78-122)/(49-67) 114/66 (07/01 0900) SpO2:  [91 %-100 %] 96 % (07/01 0900) FiO2 (%):  [28 %] 28 % (06/30 2130) Weight:  [72.9 kg] 72.9 kg (07/01 0454)  Weight change: -1.5 kg Filed Weights   12/03/19 0446 12/04/19 0500 12/05/19 0454  Weight: 77.1 kg 74.4 kg 72.9 kg    Intake/Output: I/O last 3 completed shifts: In: 449.5 [I.V.:399.5; IV Piggyback:49.9] Out: 0    Intake/Output this shift:  No intake/output data recorded.  Physical Exam: General: Critically ill  Head: Normocephalic, atraumatic. Moist oral mucosal membranes  Eyes: Anicteric, PERRL  Neck: +trach  Lungs:  clear  Heart: Regular rate and rhythm  Abdomen:  Soft, nontender  Extremities: no peripheral edema.  Neurologic: Nonfocal, moving all four extremities  Skin: No lesions  Access: Left AVG    Basic Metabolic Panel: Recent Labs  Lab 11/30/19 1558 11/30/19 1558 12/01/19 0356 12/01/19 0356 12/02/19 0526 12/02/19 0526 12/03/19 0405 12/04/19 0536 12/05/19 0507  NA 137   < > 137  --  138  --  138 139 140  K 3.3*   < > 4.2  --  4.1  --  4.2 4.0 3.4*  CL 95*   < > 92*  --  89*  --  88* 91* 92*  CO2 31   < > 33*  --  34*  --  33* 29 30  GLUCOSE 129*   < > 205*  --  165*  --  165* 182* 158*  BUN 12   < > 24*  --  35*  --  52* 44* 58*  CREATININE 1.98*   < > 3.91*  --  5.14*  --  6.15* 5.07* 6.04*  CALCIUM 7.5*   < > 7.4*   < > 7.9*   < > 8.1* 8.0* 8.0*  MG 1.8  --   --   --  1.7  --  2.0 2.0 2.1  PHOS  --   --  2.4*  --  2.9  --  5.0* 5.3* 5.2*   < > = values in this interval not displayed.    Liver Function  Tests: Recent Labs  Lab 11/28/19 1331 11/28/19 1638 11/30/19 0415 12/01/19 0356 12/02/19 0526  AST 343* 386*  --   --   --   ALT 300* 315*  --   --   --   ALKPHOS 65 51  --   --   --   BILITOT 1.5* 1.3*  --   --   --   PROT 5.8* 4.2*  --   --   --   ALBUMIN 3.3* 2.3* 2.8* 2.6* 2.7*   No results for input(s): LIPASE, AMYLASE in the last 168 hours. No results for input(s): AMMONIA in the last 168 hours.  CBC: Recent Labs  Lab 11/28/19 1331 11/28/19 1331 11/29/19 0610 11/30/19 0415 12/02/19 0526 12/03/19 0405 12/03/19 1501 12/04/19 0536 12/05/19 0507  WBC 10.1   < > 20.0*   < > 10.0 9.5 12.7* 12.2* 13.6*  NEUTROABS 6.9  --  18.6*  --   --   --   --  10.3* 11.3*  HGB 9.2*   < > 7.4*   < > 7.2* 6.9* 8.6* 8.9* 10.2*  HCT 25.8*   < > 20.7*   < > 19.3* 19.2* 23.4* 24.5* 27.5*  MCV 92.1   < > 90.4   < > 86.5 90.6 84.8 88.1 85.7  PLT 154   < > 139*   < > 157 170 172 173 210   < > = values in this interval not displayed.    Cardiac Enzymes: No results for input(s): CKTOTAL, CKMB, CKMBINDEX, TROPONINI in the last 168 hours.  BNP: Invalid input(s): POCBNP  CBG: Recent Labs  Lab 12/04/19 2007 12/04/19 2337 12/05/19 0400 12/05/19 0752 12/05/19 1117  GLUCAP 201* 185* 140* 133* 196*    Microbiology: Results for orders placed or performed during the hospital encounter of 11/28/19  Culture, blood (routine x 2)     Status: None   Collection Time: 11/28/19  1:31 PM   Specimen: BLOOD  Result Value Ref Range Status   Specimen Description BLOOD BLOOD RIGHT ARM  Final   Special Requests   Final    BOTTLES DRAWN AEROBIC AND ANAEROBIC Blood Culture adequate volume   Culture   Final    NO GROWTH 5 DAYS Performed at [facility], [address], [place], [place] [address]    Report Status 12/03/2019 FINAL  Final  Culture, blood (routine x 2)     Status: None   Collection Time: 11/28/19  2:17 PM   Specimen: BLOOD  Result Value Ref Range Status   Specimen  Description BLOOD BLOOD RIGHT WRIST  Final   Special Requests   Final    BOTTLES DRAWN AEROBIC AND ANAEROBIC Blood Culture adequate volume   Culture   Final    NO GROWTH 5 DAYS Performed at [facility], [address] [place], [place] [address]    Report Status 12/03/2019 FINAL  Final  SARS Coronavirus 2 by RT PCR (hospital order, performed in [facility] hospital lab) Nasopharyngeal Nasopharyngeal Swab     Status: None   Collection Time: 11/28/19  4:40 PM   Specimen: Nasopharyngeal Swab  Result Value Ref Range Status   SARS Coronavirus 2 NEGATIVE NEGATIVE Final    Comment: (NOTE) SARS-CoV-2 target nucleic acids are NOT DETECTED.  The SARS-CoV-2 RNA is generally detectable in upper and lower respiratory specimens during the acute phase of infection. The lowest concentration of SARS-CoV-2 viral copies this assay can detect is 250 copies / mL. A negative result does not preclude SARS-CoV-2 infection and should not be used as the sole basis for treatment or other patient management decisions.  A negative result may occur with improper specimen collection / handling, submission of specimen other than nasopharyngeal swab, presence of viral mutation(s) within the areas targeted by this assay, and inadequate number of viral copies (<250 copies / mL). A negative result must be combined with clinical observations, patient history, and epidemiological information.  Fact Sheet for Patients:   StrictlyIdeas.no  Fact Sheet for Healthcare Providers: BankingDealers.co.za  This test is not yet approved or  cleared by the [place] FDA and has been authorized for detection and/or diagnosis of SARS-CoV-2 by FDA under an Emergency Use Authorization (EUA).  This EUA will remain in effect (meaning this test can be used) for the duration of the COVID-19 declaration under Section 564(b)(1) of the Act, 21 U.S.C. section 360bbb-3(b)(1),  unless the authorization is terminated or revoked sooner.  Performed at [facility], [phone]  [address] [place], [place] [address]   MRSA PCR Screening     Status: None   Collection Time: 11/28/19  9:08 PM   Specimen: Nasopharyngeal  Result Value Ref Range Status   MRSA by PCR NEGATIVE NEGATIVE Final    Comment:        The GeneXpert MRSA Assay (FDA approved for NASAL specimens only), is one component of a comprehensive MRSA colonization surveillance program. It is not intended to diagnose MRSA infection nor to guide or monitor treatment for MRSA infections. Performed at [facility], [address] [place], [place] [address]     Coagulation Studies: No results for input(s): LABPROT, INR in the last 72 hours.  Urinalysis: No results for input(s): COLORURINE, LABSPEC, PHURINE, GLUCOSEU, HGBUR, BILIRUBINUR, KETONESUR, PROTEINUR, UROBILINOGEN, NITRITE, LEUKOCYTESUR in the last 72 hours.  Invalid input(s): APPERANCEUR    Imaging: No results found.   Medications:   . sodium chloride    . sodium chloride Stopped (11/28/19 2021)  . sodium chloride    . sodium chloride    . amiodarone 30 mg/hr (12/05/19 1008)  . famotidine (PEPCID) IV 20 mg (12/05/19 0957)  . norepinephrine (LEVOPHED) Adult infusion Stopped (12/03/19 1646)   . sodium chloride   Intravenous Once  . chlorhexidine      . chlorhexidine gluconate (MEDLINE KIT)  15 mL Mouth Rinse BID  . Chlorhexidine Gluconate Cloth  6 each Topical Q0600  . epoetin (EPOGEN/PROCRIT) injection  10,000 Units Intravenous Q T,Th,Sa-HD  . feeding supplement (NEPRO CARB STEADY)  237 mL Oral BID BM  . insulin aspart  0-6 Units Subcutaneous Q4H  . mouth rinse  15 mL Mouth Rinse 10 times per day  . midazolam  2 mg Intravenous Once  . midodrine  5 mg Oral TID WC  . multivitamin  1 tablet Oral QHS  . polyethylene glycol  17 g Oral Daily  . sodium chloride flush  10-40 mL Intracatheter Q12H   sodium chloride,  sodium chloride, alteplase, bisacodyl, fentaNYL (SUBLIMAZE) injection, heparin, lidocaine (PF), lidocaine-prilocaine, midazolam, midazolam, ondansetron (ZOFRAN) IV, pentafluoroprop-tetrafluoroeth, polyethylene glycol, sodium chloride flush  Assessment/ Plan:  [name] is a 86 y.o. 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 End-stage renal disease: dialysis for today. Orders prepared. Monitor potassium: 3K bath.   2. Hypokalemia: has a history of hyperkalemia. Unclear cause of hypokalemia.  Consider restarting ARB/ACE-I to help increase serum potassium levels when blood pressure allows  3. Hypotension: with recent cardiac arrest. Holding home regimen of amlodipnie, hydralazine, isosorbide mononitrate, torsemide, and metoprolol.  Currently requiring norepinephrine.  - midodrine started yesterday  4. Anemia with chronic kidney disease: receives Mircera as outpatient.  PRBC transfusion yesterday.  - EPO with HD treatment.    LOS: 7 Mackinley Cassaday 7/1/202111:32 AM

## 2019-12-05 NOTE — Progress Notes (Signed)
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   Apparently patient lost pulse while having his the most recent dialysis session. Patient had also developed A. fib RVR and was apneic in the field and required bag mask ventilation. After arrival to the ER patient again coded x2 with V. fib arrest status post ACLS and ROSC, found to be amiodarone and 20 MCG Levophed.[place] bedside. He underwent ACLS x 3. He was placed on the arctic sun protocol.  The patient required mechanical ventilation via tracheostomy. He was placed on an amiodarone gtt. Triple lumen catheter was placed. TTE was performed and demonstrated EF 25-30%. 26% LVEF by PLAX. Grade 1 diastolic dysfunction. RV is mildly enlarged.   The patient remains on low dose norepinephrine and amiodarone gtt. He was dialyzed in the ICU this morning.   Consultants  . Cardiology . Nephrology . PCCM  Procedures  . Dialysis . Mechanical ventilation .   Antibiotics   Anti-infectives (From admission, onward)   Start     Dose/Rate Route Frequency Ordered Stop   11/28/19 1545  ceFEPIme (MAXIPIME) 2 g in sodium chloride 0.9 % 100 mL IVPB        2 g 200 mL/hr over 30 Minutes Intravenous  Once 11/28/19 1536 11/28/19 1646   11/28/19 1545  vancomycin (VANCOCIN) IVPB 1000 mg/200 mL premix        1,000 mg 200 mL/hr over 60 Minutes Intravenous  Once 11/28/19 1536 11/28/19 1722     .  Subjective  The patient is resting comfortably on HD. No new complaints.  Objective   Vitals:  Vitals:   12/05/19 1700 12/05/19 1715  BP: (!) 81/51 (!) 95/52  Pulse: 73 66  Resp: 16 (!) 25  Temp:    SpO2: 100% 100%   Exam:  Constitutional:  . The patient is sleeping. No acute distress. Respiratory:  . No increased work of breathing. . No wheezes, rales, or rhonchi . No tactile fremitus Cardiovascular:  . Regular rate and rhythm . No murmurs, ectopy, or gallups. . No lateral PMI. No thrills. Abdomen:  . Abdomen is soft, non-tender, non-distended . No hernias, masses, or organomegaly . Normoactive bowel sounds.  Musculoskeletal:  . No cyanosis, clubbing, or edema Skin:  . No rashes, lesions, ulcers . palpation of skin: no induration or nodules Neurologic:  . CN 2-12 intact . Sensation all 4 extremities intact Psychiatric:  . Mental status o Mood, affect appropriate o Orientation to person, place, time  . judgment and insight appear intact  I have personally reviewed the following:   Today's Data  . Vitals, CBC, BMP  Micro Data  . Blood cultures x 2: No growth  Scheduled Meds: . sodium chloride   Intravenous Once  . chlorhexidine      . chlorhexidine gluconate (MEDLINE KIT)  15 mL Mouth Rinse BID  . Chlorhexidine Gluconate Cloth  6 each Topical Q0600  . epoetin (EPOGEN/PROCRIT) injection  10,000 Units Intravenous Q T,Th,Sa-HD  . feeding supplement (NEPRO CARB STEADY)  237 mL Oral  BID BM  . insulin aspart  0-6 Units Subcutaneous Q4H  . mouth rinse  15 mL Mouth Rinse 10 times per day  . midazolam  2 mg Intravenous Once  . midodrine  5 mg Oral TID WC  . multivitamin  1 tablet Oral QHS  . polyethylene glycol  17 g Oral Daily  . sodium chloride flush  10-40 mL Intracatheter Q12H   Continuous Infusions: . sodium chloride    . sodium chloride Stopped (11/28/19 2021)  . sodium chloride    . sodium chloride    . amiodarone 30 mg/hr (12/05/19 1700)  .  famotidine (PEPCID) IV Stopped (12/05/19 1027)  . norepinephrine (LEVOPHED) Adult infusion Stopped (12/03/19 1646)    Active Problems:   Cardiac arrest ([place])   LOS: 7 days   A & P  Severe ACUTE Hypoxic and Hypercapnic Respiratory Failure: Pt has been taken off of the vent. He is now saturating 100% on 5L. He is receiving nebulizer treatments  Acute Systolic Cardiac Failure: EF 20%. Complicated by Atrial fibrillation with RVR and ischemic cardiomyopathy.  SYSTOLIC CARDIAC FAILURE- EF 20%. The patient continues to require IV amiodarone as he is still unable to take PO. No anticoagulation due to bleeding history and anemia. Cardiology does not feel that arrest was a primary ischemic event. Echocdid not reveal changes in wall motion or EF.   ESRD on HD: As per nephrology. Continue HD. Medications will be renally dosed.  Cardiogenic Shock: Continues to require vasopressor support. They are trying to wean these off this am.  Laryngeal Carcinoma: Stable. Chronic tracheostomy  Altered Mental Status: Improving.  I have seen and examined this patient myself. I have spent 35 minutes in his evaluation and care.  DVT Prophylaxis: SCD's CODE STATUS: Full Code Family Communication: Wife at bedside Disposition: From Home. Anticipate discharge to SNF/Rehab.  Status is: Inpatient  Remains inpatient appropriate because:Inpatient level of care appropriate due to severity of illness   Dispo: The patient is from: Home              Anticipated d/c is to: To be determined.              Anticipated d/c date is: > 3 days              Patient currently is not medically stable to d/c.         Teleshia Lemere, DO [facility] Direct contact: see [email or web address]  7PM-7AM contact night coverage as above 12/05/2019, 5:41 PM  LOS: 7 days

## 2019-12-05 NOTE — Progress Notes (Signed)
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   Apparently patient lost pulse while having his the most recent dialysis session. Patient had also developed A. fib RVR and was apneic in the field and required bag mask ventilation. After arrival to the ER patient again coded x2 with V. fib arrest status post ACLS and ROSC, found to be amiodarone and 20 MCG Levophed.wifeat bedside.  Lines / Drains: Left upper extremity HD access, I/O positive  Cultures / Sepsis markers: Tracheal aspirate  Antibiotics: Vancomycin and cefepime   Protocols / Consultants: PCCM  Tests / Events: 6/24 prolonged CARDIAC ARREST 6/24 HYPOTHERMIA PROTOCOL 6/25 cardiogenic shock on vent and pressors 6/26 increased pressors for HD 6/27 remains on vent, +delerium, +pressors 6/28-6/29 off vent, alert and awake, on amiodarone 6/30 remains off vent, on  amio 7/1 started on midodrine, on amio   CC  follow up respiratory failure  SUBJECTIVE Prognosis is guarded End stage renal failure End stage heart failure Alert and awake, off vent Started on midodrine    BP (!) 100/54   Pulse 80   Temp 97.7 F (36.5 C) (Oral)   Resp 17   Ht _0  (1.676 m)   Wt 72.9 kg   SpO2 99%   BMI 25.94 kg/m    I/O last 3 completed shifts: In: 449.5 [I.V.:399.5; IV Piggyback:49.9] Out: 0  No intake/output data recorded.  SpO2: 99 % O2 Flow Rate (L/min): 5 L/min FiO2 (%): 28 %  Estimated body mass index is 25.94 kg/m as calculated from the following:    Height as of this encounter: _1  (1.676 m).   Weight as of this encounter: 72.9 kg.   Review of Systems:  Gen:  Denies  fever, sweats, chills weight loss  HEENT: Denies blurred vision, double vision, ear pain, eye pain, hearing loss, nose bleeds, sore throat Cardiac:  No dizziness, chest pain or heaviness, chest tightness,edema, No JVD Resp:   No cough, -sputum production, -shortness of breath,-wheezing, -hemoptysis,  Other:  All other systems negative  Physical Examination:   General Appearance: No distress  Neuro:without focal findings, s/p trach HEENT: PERRLA, EOM intact.   Pulmonary: normal breath sounds, No wheezing.  CardiovascularNormal S1,S2.  No m/r/g.   Abdomen: Benign, Soft, non-tender. Renal:  No costovertebral tenderness  GU:  Not performed at this time. Endoc: No evident thyromegaly Skin:   warm, no rashes, no ecchymosis  Extremities: normal, no cyanosis, clubbing. PSYCHIATRIC: Mood, affect within normal limits.   MEDICATIONS: I have reviewed all medications and confirmed regimen as documented   CULTURE RESULTS   Recent Results (from the past 240 hour(s))  Culture, blood (routine x 2)     Status: None   Collection Time: 11/28/19  1:31 PM   Specimen: BLOOD  Result Value Ref Range Status   Specimen Description BLOOD BLOOD RIGHT ARM  Final   Special Requests   Final    BOTTLES DRAWN AEROBIC AND ANAEROBIC Blood Culture adequate volume   Culture   Final    NO GROWTH 5 DAYS Performed at [facility], [address], [place], [place] [address]  Report Status 12/03/2019 FINAL  Final  Culture, blood (routine x 2)     Status: None   Collection Time: 11/28/19  2:17 PM   Specimen: BLOOD  Result Value Ref Range Status   Specimen Description BLOOD BLOOD RIGHT WRIST  Final   Special Requests   Final    BOTTLES DRAWN AEROBIC AND ANAEROBIC Blood Culture adequate volume   Culture   Final    NO GROWTH 5 DAYS Performed at [facility], [address], [place], [place] [address]    Report Status 12/03/2019 FINAL  Final  SARS Coronavirus 2 by RT PCR (hospital order, performed in [facility] hospital lab) Nasopharyngeal Nasopharyngeal Swab     Status: None   Collection Time: 11/28/19  4:40 PM   Specimen: Nasopharyngeal Swab  Result Value Ref Range Status   SARS Coronavirus 2 NEGATIVE NEGATIVE Final    Comment: (NOTE) SARS-CoV-2 target nucleic acids are NOT DETECTED.  The SARS-CoV-2 RNA is generally detectable in upper and lower respiratory specimens during the acute phase of infection. The lowest concentration of SARS-CoV-2 viral copies this assay can detect is 250 copies / mL. A negative result does not preclude SARS-CoV-2 infection and should not be used as the sole basis for treatment or other patient management decisions.  A negative result may occur with improper specimen collection / handling, submission of specimen other than nasopharyngeal swab, presence of viral mutation(s) within the areas targeted by this assay, and inadequate number of viral copies (<250 copies / mL). A negative result must be combined with clinical observations, patient history, and epidemiological information.  Fact Sheet for Patients:   StrictlyIdeas.no  Fact Sheet for Healthcare Providers: BankingDealers.co.za  This test is not yet approved or  cleared by the [place] FDA and has been authorized for detection and/or diagnosis of SARS-CoV-2 by FDA under an Emergency Use Authorization (EUA).  This EUA will remain in effect (meaning this test can be used) for the duration of the COVID-19 declaration under Section 564(b)(1) of the Act, 21 U.S.C. section 360bbb-3(b)(1), unless the authorization is terminated or revoked sooner.  Performed at [facility], [address] [place], [place] [address]   MRSA PCR Screening     Status: None   Collection Time: 11/28/19  9:08 PM    Specimen: Nasopharyngeal  Result Value Ref Range Status   MRSA by PCR NEGATIVE NEGATIVE Final    Comment:        The GeneXpert MRSA Assay (FDA approved for NASAL specimens only), is one component of a comprehensive MRSA colonization surveillance program. It is not intended to diagnose MRSA infection nor to guide or monitor treatment for MRSA infections. Performed at [facility], [address] [place],  [address]           IMAGING    No results found.   Nutrition Status: Nutrition Problem: Increased nutrient needs Etiology: catabolic illness (CHF, COPD, ESRD on HD) Signs/Symptoms: estimated needs Interventions: Nepro shake, MVI      ASSESSMENT AND PLAN SYNOPSIS   Severe ACUTE Hypoxic and Hypercapnic Respiratory Failure -continue Full MV support -continue Bronchodilator Therapy -Wean Fio2 and PEEP as tolerated -will perform SAT/SBT when respiratory parameters are met -VAP/VENT bundle implementation  ACUTE SYSTOLIC CARDIAC FAILURE- EF 20% afib wth RVR, ischemic cardiomyopathy -follow up cardiology recs  END STAGE KIDNEY INJURY/Renal Failure -Avoid nephrotoxic agents -Ensure adequate renal perfusion, optimize oxygenation -Renal dose medications HD as needed    SHOCK-CARDIOGENIC -use vasopressors to  keep MAP>65  Wean off pressors this AM   CARDIAC ICU monitoring   GI GI PROPHYLAXIS as indicated    DIET--> as tolerated Constipation protocol as indicated  ENDO - will use ICU hypoglycemic\Hyperglycemia protocol if indicated     ELECTROLYTES -follow labs as needed -replace as needed -pharmacy consultation and following   DVT/GI PRX ordered and assessed TRANSFUSIONS AS NEEDED MONITOR FSBS I Assessed the need for Labs I Assessed the need for Foley I Assessed the need for Central Venous Line Family Discussion when available I Assessed the need for Mobilization I made an Assessment of medications to be adjusted  accordingly Safety Risk assessment completed   CASE DISCUSSED IN MULTIDISCIPLINARY ROUNDS WITH ICU TEAM    Octavia Mottola Patricia Pesa, M.D.  [facility]  Medical Director [place] Director [place] Department

## 2019-12-05 NOTE — Progress Notes (Signed)
SLP Cancellation Note  Patient Details Name: Kerry [name] MRN: [id number] DOB: [birth year]   Cancelled treatment:       Reason Eval/Treat Not Completed: Patient at procedure or test/unavailable (chart reviewed; consulted NSG re: pt's status). Pt is currently receiving HD. NSG reported pt ate bites/sips at breakfast meal this morning w/out difficulty noted(overtly) - no decline in respiratory status. Pt continues w/ multiple medical issues requiring CCU status.  ST services will f/u w/ pt's status and toleration of diet next 2-3 days. Recommend continued aspiration precautions; discussed above w/ NSG.      [name], MS, CCC-SLP Jayse Hodkinson 12/05/2019, 1:01 PM

## 2019-12-05 NOTE — Progress Notes (Signed)
This note also relates to the following rows which could not be included: Pulse Rate - Cannot attach notes to unvalidated device data Resp - Cannot attach notes to unvalidated device data BP - Cannot attach notes to unvalidated device data  Hd sarted

## 2019-12-05 NOTE — Progress Notes (Signed)
This note also relates to the following rows which could not be included: Pulse Rate - Cannot attach notes to unvalidated device data Resp - Cannot attach notes to unvalidated device data BP - Cannot attach notes to unvalidated device data  Hd completed  

## 2019-12-06 ENCOUNTER — Encounter: Payer: Self-pay | Admitting: Pulmonary Disease

## 2019-12-06 ENCOUNTER — Inpatient Hospital Stay: Payer: Medicare Other

## 2019-12-06 DIAGNOSIS — Z992 Dependence on renal dialysis: Secondary | ICD-10-CM

## 2019-12-06 DIAGNOSIS — J9601 Acute respiratory failure with hypoxia: Secondary | ICD-10-CM

## 2019-12-06 DIAGNOSIS — J9602 Acute respiratory failure with hypercapnia: Secondary | ICD-10-CM

## 2019-12-06 DIAGNOSIS — R000 Tachycardia, unspecified: Secondary | ICD-10-CM

## 2019-12-06 DIAGNOSIS — R1033 Periumbilical pain: Secondary | ICD-10-CM

## 2019-12-06 LAB — GLUCOSE, CAPILLARY
Glucose-Capillary: 148 mg/dL — ABNORMAL HIGH (ref 70–99)
Glucose-Capillary: 231 mg/dL — ABNORMAL HIGH (ref 70–99)
Glucose-Capillary: 251 mg/dL — ABNORMAL HIGH (ref 70–99)
Glucose-Capillary: 192 mg/dL — ABNORMAL HIGH (ref 70–99)
Glucose-Capillary: 175 mg/dL — ABNORMAL HIGH (ref 70–99)
Glucose-Capillary: 227 mg/dL — ABNORMAL HIGH (ref 70–99)

## 2019-12-06 LAB — CBC WITH DIFFERENTIAL/PLATELET
Abs Immature Granulocytes: 0.11 10*3/uL — ABNORMAL HIGH (ref 0.00–0.07)
Basophils Absolute: 0 10*3/uL (ref 0.0–0.1)
Basophils Relative: 0 %
Eosinophils Absolute: 0.1 10*3/uL (ref 0.0–0.5)
Eosinophils Relative: 1 %
HCT: 27.8 % — ABNORMAL LOW (ref 39.0–52.0)
Hemoglobin: 10.1 g/dL — ABNORMAL LOW (ref 13.0–17.0)
Immature Granulocytes: 1 %
Lymphocytes Relative: 8 %
Lymphs Abs: 1 10*3/uL (ref 0.7–4.0)
MCH: 32.5 pg (ref 26.0–34.0)
MCHC: 36.3 g/dL — ABNORMAL HIGH (ref 30.0–36.0)
MCV: 89.4 fL (ref 80.0–100.0)
Monocytes Absolute: 0.8 10*3/uL (ref 0.1–1.0)
Monocytes Relative: 6 %
Neutro Abs: 10.7 10*3/uL — ABNORMAL HIGH (ref 1.7–7.7)
Neutrophils Relative %: 84 %
Platelets: 224 10*3/uL (ref 150–400)
RBC: 3.11 MIL/uL — ABNORMAL LOW (ref 4.22–5.81)
RDW: 17 % — ABNORMAL HIGH (ref 11.5–15.5)
Smear Review: NORMAL
WBC: 12.7 10*3/uL — ABNORMAL HIGH (ref 4.0–10.5)
nRBC: 4 % — ABNORMAL HIGH (ref 0.0–0.2)

## 2019-12-06 LAB — BASIC METABOLIC PANEL
Anion gap: 16 — ABNORMAL HIGH (ref 5–15)
BUN: 42 mg/dL — ABNORMAL HIGH (ref 8–23)
CO2: 30 mmol/L (ref 22–32)
Calcium: 8 mg/dL — ABNORMAL LOW (ref 8.9–10.3)
Chloride: 93 mmol/L — ABNORMAL LOW (ref 98–111)
Creatinine, Ser: 5.23 mg/dL — ABNORMAL HIGH (ref 0.61–1.24)
GFR calc Af Amer: 11 mL/min — ABNORMAL LOW (ref >60)
GFR calc non Af Amer: 9 mL/min — ABNORMAL LOW (ref >60)
Glucose, Bld: 168 mg/dL — ABNORMAL HIGH (ref 70–99)
Potassium: 3 mmol/L — ABNORMAL LOW (ref 3.5–5.1)
Sodium: 139 mmol/L (ref 135–145)

## 2019-12-06 LAB — MAGNESIUM: Magnesium: 1.9 mg/dL (ref 1.7–2.4)

## 2019-12-06 LAB — PHOSPHORUS: Phosphorus: 3.8 mg/dL (ref 2.5–4.6)

## 2019-12-06 MED ORDER — IOHEXOL 300 MG/ML  SOLN
100.0000 mL | Freq: Once | INTRAMUSCULAR | Status: AC | PRN
  Administered 2019-12-06: 100 mL via INTRAVENOUS

## 2019-12-06 MED ORDER — DOCUSATE SODIUM 283 MG RE ENEM
1.0000 | ENEMA | Freq: Once | RECTAL | Status: DC
  Filled 2019-12-06: qty 1

## 2019-12-06 MED ORDER — SODIUM CHLORIDE 0.9 % IV BOLUS
500.0000 mL | Freq: Once | INTRAVENOUS | Status: AC
  Administered 2019-12-06: 500 mL via INTRAVENOUS

## 2019-12-06 MED ORDER — INSULIN ASPART 100 UNIT/ML ~~LOC~~ SOLN
0.0000 [IU] | Freq: Three times a day (TID) | SUBCUTANEOUS | Status: DC
  Administered 2019-12-06: 2 [IU] via SUBCUTANEOUS
  Administered 2019-12-06: 3 [IU] via SUBCUTANEOUS
  Administered 2019-12-07: 1 [IU] via SUBCUTANEOUS
  Administered 2019-12-07: 3 [IU] via SUBCUTANEOUS
  Administered 2019-12-07 – 2019-12-08 (×4): 1 [IU] via SUBCUTANEOUS
  Administered 2019-12-09: 2 [IU] via SUBCUTANEOUS
  Administered 2019-12-09 – 2019-12-13 (×8): 1 [IU] via SUBCUTANEOUS
  Administered 2019-12-15: 3 [IU] via SUBCUTANEOUS
  Administered 2019-12-15: 2 [IU] via SUBCUTANEOUS
  Administered 2019-12-16: 1 [IU] via SUBCUTANEOUS
  Administered 2019-12-16: 2 [IU] via SUBCUTANEOUS
  Filled 2019-12-06 (×20): qty 1

## 2019-12-06 MED ORDER — HYDROMORPHONE HCL 1 MG/ML IJ SOLN
0.5000 mg | INTRAMUSCULAR | Status: DC | PRN
  Administered 2019-12-06 – 2019-12-08 (×2): 1 mg via INTRAVENOUS
  Filled 2019-12-06 (×2): qty 1

## 2019-12-06 MED ORDER — POTASSIUM CHLORIDE 10 MEQ/100ML IV SOLN
10.0000 meq | INTRAVENOUS | Status: AC
  Administered 2019-12-06 (×4): 10 meq via INTRAVENOUS
  Filled 2019-12-06 (×4): qty 100

## 2019-12-06 MED ORDER — MIDODRINE HCL 5 MG PO TABS
10.0000 mg | ORAL_TABLET | Freq: Three times a day (TID) | ORAL | Status: DC
  Administered 2019-12-06 – 2019-12-17 (×32): 10 mg via ORAL
  Filled 2019-12-06 (×32): qty 2

## 2019-12-06 MED ORDER — INSULIN ASPART 100 UNIT/ML ~~LOC~~ SOLN
0.0000 [IU] | Freq: Every day | SUBCUTANEOUS | Status: DC
  Administered 2019-12-08 – 2019-12-16 (×2): 2 [IU] via SUBCUTANEOUS
  Filled 2019-12-06 (×2): qty 1

## 2019-12-06 NOTE — Progress Notes (Addendum)
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   Apparently patient lost pulse while having his the most recent dialysis session. Patient had also developed A. fib RVR and was apneic in the field and required bag mask ventilation. After arrival to the ER patient again coded x2 with V. fib arrest status post ACLS and ROSC, found to be amiodarone and 20 MCG Levophed.Wifeat bedside. He underwent ACLS x 3. He was placed on the arctic sun protocol.  The patient required mechanical ventilation via tracheostomy. He was placed on an amiodarone gtt. Triple lumen catheter was placed. TTE was performed and demonstrated EF 25-30%. 26% LVEF by PLAX. Grade 1 diastolic dysfunction. RV is mildly enlarged.   The patient remains on low dose amiodarone gtt. He was dialyzed in the ICU this morning.   Consultants  . Cardiology . Nephrology . PCCM  Procedures  . Dialysis . Mechanical ventilation  Antibiotics   Anti-infectives (From admission, onward)   Start     Dose/Rate Route Frequency Ordered Stop   11/28/19 1545  ceFEPIme (MAXIPIME) 2 g in sodium chloride 0.9 % 100 mL IVPB        2 g 200 mL/hr over 30 Minutes Intravenous  Once 11/28/19 1536 11/28/19 1646   11/28/19 1545  vancomycin (VANCOCIN) IVPB 1000 mg/200 mL premix        1,000 mg 200 mL/hr over 60 Minutes Intravenous  Once 11/28/19 1536 11/28/19 1722     Subjective  The patient is  resting comfortably on HD. No new complaints.  Objective   Vitals:  Vitals:   12/06/19 1400 12/06/19 1500  BP: 95/80 (!) 89/71  Pulse: (!) 56 (!) 131  Resp: 19 (!) 28  Temp:    SpO2: 99% 100%   Exam:  Constitutional:  . The patient is sleeping. No acute distress. Respiratory:  . No increased work of breathing. . No wheezes, rales, or rhonchi . No tactile fremitus Cardiovascular:  . Regular rate and rhythm . No murmurs, ectopy, or gallups. . No lateral PMI. No thrills. Abdomen:  . Abdomen is soft, non-tender, non-distended . No hernias, masses, or organomegaly . Normoactive bowel sounds.  Musculoskeletal:  . No cyanosis, clubbing, or edema Skin:  . No rashes, lesions, ulcers . palpation of skin: no induration or nodules Neurologic:  . CN 2-12 intact . Sensation all 4 extremities intact Psychiatric:  . Mental status o Mood, affect appropriate o Orientation to person, place, time  . judgment and insight appear intact  I have personally reviewed the following:   Today's Data  . Vitals, CBC, BMP  Micro Data  . Blood cultures x 2: No growth  Scheduled Meds: . sodium chloride   Intravenous Once  . chlorhexidine gluconate (MEDLINE KIT)  15 mL Mouth Rinse BID  . Chlorhexidine Gluconate Cloth  6 each Topical Q0600  . epoetin (EPOGEN/PROCRIT) injection  10,000 Units Intravenous Q T,Th,Sa-HD  . feeding supplement (NEPRO CARB STEADY)  237 mL Oral BID BM  . insulin aspart  0-5 Units Subcutaneous QHS  .   insulin aspart  0-6 Units Subcutaneous TID WC  . mouth rinse  15 mL Mouth Rinse 10 times per day  . midazolam  2 mg Intravenous Once  . midodrine  10 mg Oral TID WC  . multivitamin  1 tablet Oral QHS  . polyethylene glycol  17 g Oral Daily  . sodium chloride flush  10-40 mL Intracatheter Q12H   Continuous Infusions: . sodium chloride    . sodium chloride Stopped (11/28/19 2021)  . sodium chloride    . sodium chloride    . amiodarone 30 mg/hr (12/06/19 1112)  .  famotidine (PEPCID) IV 20 mg (12/06/19 0952)  . norepinephrine (LEVOPHED) Adult infusion Stopped (12/03/19 1646)    Active Problems:   Cardiac arrest ([place])   LOS: 8 days   A & P  Severe ACUTE Hypoxic and Hypercapnic Respiratory Failure: Pt has been taken off of the vent. He is now saturating 100% on 5L. He is receiving nebulizer treatments  Acute Systolic Cardiac Failure: EF 20%. Complicated by Atrial fibrillation with RVR and ischemic cardiomyopathy.  SYSTOLIC CARDIAC FAILURE- EF 20%. The patient continues to require IV amiodarone as he is still unable to take PO. No anticoagulation due to bleeding history and anemia. Cardiology does not feel that arrest was a primary ischemic event. Echocdid not reveal changes in wall motion or EF.   ESRD on HD: As per nephrology. Continue HD. Medications will be renally dosed.  Cardiogenic Shock: Continues to require vasopressor support. They are trying to wean these off this am.  Laryngeal Carcinoma: Stable. Chronic tracheostomy  Altered Mental Status: Improving.  I have seen and examined this patient myself. I have spent 35 minutes in his evaluation and care.  DVT Prophylaxis: SCD's CODE STATUS: Full Code Family Communication: Wife at bedside Disposition: From Home. Anticipate discharge to SNF/Rehab.  Status is: Inpatient  Remains inpatient appropriate because:Inpatient level of care appropriate due to severity of illness  Dispo: The patient is from: Home              Anticipated d/c is to: To be determined.              Anticipated d/c date is: > 3 days              Patient currently is not medically stable to d/c.  Sosha Shepherd, DO [facility] Direct contact: see [email or web address]  7PM-7AM contact night coverage as above 12/06/2019, 2:41 PM  LOS: 7 days   ADDENDUM: I was contacted by Vanita Ingles regarding patient with "pain from bladder distention" and request for pain medication. Bladder scan of this dialysis patient demonstrated  100 cc urine. I have ordered a CT of the abdomen and pelvis with IV contrast and started low dose dilaudid for pain control. Additional 30 minutes at bedside.  ADDENDUM: Noted MEWS score of 7. Likely due to hypotension, with ongoing issues of tachycardia and tachypnea. I saw patient as he returned from CT scan. Apparently nursing had attempted to decrease his norepinephrine from 1.5 to 1.0. However, by the time I saw the patient it had been turned back up to 1.5. Despite this the systolic blood pressure remained in the 80's. I have given the patient a 500 cc bolus on NS which may help with blood pressure and HR. Awaiting results of CT abdomen. Additional 30 minutes at bedside.

## 2019-12-06 NOTE — Progress Notes (Signed)
[facility]  ROUNDING NOTE   Subjective:   Midodrine increased to 80m tid  Wife at bedside.   Hemodialysis treatment yesterday. Tolerated treamtent well with lower BFR and no ultrafiltration.   Norepinephrine gtt Amiodarone gtt  Objective:  Vital signs in last 24 hours:  Temp:  [97.6 F (36.4 C)-98.2 F (36.8 C)] 97.9 F (36.6 C) (07/02 0824) Pulse Rate:  [42-110] 110 (07/02 0900) Resp:  [14-29] 29 (07/02 0900) BP: (81-134)/(49-76) 82/50 (07/02 0900) SpO2:  [92 %-100 %] 99 % (07/02 0900) FiO2 (%):  [28 %-58 %] 28 % (07/02 0831)  Weight change:  Filed Weights   12/03/19 0446 12/04/19 0500 12/05/19 0454  Weight: 77.1 kg 74.4 kg 72.9 kg    Intake/Output: I/O last 3 completed shifts: In: 664.7 [I.V.:614.7; IV Piggyback:50] Out: 0    Intake/Output this shift:  No intake/output data recorded.  Physical Exam: General: Critically ill  Head: Normocephalic, atraumatic. Moist oral mucosal membranes  Eyes: Anicteric, PERRL  Neck: +trach  Lungs:  clear  Heart: Regular rate and rhythm  Abdomen:  Soft, nontender  Extremities: no peripheral edema.  Neurologic: Nonfocal, moving all four extremities  Skin: No lesions  Access: Left AVG    Basic Metabolic Panel: Recent Labs  Lab 12/02/19 0526 12/02/19 0526 12/03/19 0405 12/03/19 0405 12/04/19 0536 12/05/19 0507 12/06/19 0441  NA 138  --  138  --  139 140 139  K 4.1  --  4.2  --  4.0 3.4* 3.0*  CL 89*  --  88*  --  91* 92* 93*  CO2 34*  --  33*  --  _0 GLUCOSE 165*  --  165*  --  182* 158* 168*  BUN 35*  --  52*  --  44* 58* 42*  CREATININE 5.14*  --  6.15*  --  5.07* 6.04* 5.23*  CALCIUM 7.9*   < > 8.1*   < > 8.0* 8.0* 8.0*  MG 1.7  --  2.0  --  2.0 2.1 1.9  PHOS 2.9  --  5.0*  --  5.3* 5.2* 3.8   < > = values in this interval not displayed.    Liver Function Tests: Recent Labs  Lab 11/30/19 0415 12/01/19 0356 12/02/19 0526  ALBUMIN 2.8* 2.6* 2.7*   No results for input(s):  LIPASE, AMYLASE in the last 168 hours. No results for input(s): AMMONIA in the last 168 hours.  CBC: Recent Labs  Lab 12/03/19 0405 12/03/19 1501 12/04/19 0536 12/05/19 0507 12/06/19 0441  WBC 9.5 12.7* 12.2* 13.6* 12.7*  NEUTROABS  --   --  10.3* 11.3* 10.7*  HGB 6.9* 8.6* 8.9* 10.2* 10.1*  HCT 19.2* 23.4* 24.5* 27.5* 27.8*  MCV 90.6 84.8 88.1 85.7 89.4  PLT 170 172 173 210 224    Cardiac Enzymes: No results for input(s): CKTOTAL, CKMB, CKMBINDEX, TROPONINI in the last 168 hours.  BNP: Invalid input(s): POCBNP  CBG: Recent Labs  Lab 12/05/19 0752 12/05/19 1117 12/05/19 1519 12/05/19 2013 12/06/19 0731  GLUCAP 133* 196* 103* 212 148*    Microbiology: Results for orders placed or performed during the hospital encounter of 11/28/19  Culture, blood (routine x 2)     Status: None   Collection Time: 11/28/19  1:31 PM   Specimen: BLOOD  Result Value Ref Range Status   Specimen Description BLOOD BLOOD RIGHT ARM  Final   Special Requests   Final    BOTTLES DRAWN AEROBIC AND ANAEROBIC Blood Culture  adequate volume   Culture   Final    NO GROWTH 5 DAYS Performed at [facility], [address] [place], [place] [address]    Report Status 12/03/2019 FINAL  Final  Culture, blood (routine x 2)     Status: None   Collection Time: 11/28/19  2:17 PM   Specimen: BLOOD  Result Value Ref Range Status   Specimen Description BLOOD BLOOD RIGHT WRIST  Final   Special Requests   Final    BOTTLES DRAWN AEROBIC AND ANAEROBIC Blood Culture adequate volume   Culture   Final    NO GROWTH 5 DAYS Performed at [facility], [address] [place], [place] [address]    Report Status 12/03/2019 FINAL  Final  SARS Coronavirus 2 by RT PCR (hospital order, performed in [facility] hospital lab) Nasopharyngeal Nasopharyngeal Swab     Status: None   Collection Time: 11/28/19  4:40 PM   Specimen: Nasopharyngeal Swab  Result Value Ref Range Status   SARS Coronavirus 2  NEGATIVE NEGATIVE Final    Comment: (NOTE) SARS-CoV-2 target nucleic acids are NOT DETECTED.  The SARS-CoV-2 RNA is generally detectable in upper and lower respiratory specimens during the acute phase of infection. The lowest concentration of SARS-CoV-2 viral copies this assay can detect is 250 copies / mL. A negative result does not preclude SARS-CoV-2 infection and should not be used as the sole basis for treatment or other patient management decisions.  A negative result may occur with improper specimen collection / handling, submission of specimen other than nasopharyngeal swab, presence of viral mutation(s) within the areas targeted by this assay, and inadequate number of viral copies (<250 copies / mL). A negative result must be combined with clinical observations, patient history, and epidemiological information.  Fact Sheet for Patients:   StrictlyIdeas.no  Fact Sheet for Healthcare Providers: BankingDealers.co.za  This test is not yet approved or  cleared by the [place] FDA and has been authorized for detection and/or diagnosis of SARS-CoV-2 by FDA under an Emergency Use Authorization (EUA).  This EUA will remain in effect (meaning this test can be used) for the duration of the COVID-19 declaration under Section 564(b)(1) of the Act, 21 U.S.C. section 360bbb-3(b)(1), unless the authorization is terminated or revoked sooner.  Performed at [facility], [address] [place], [place] [address]   MRSA PCR Screening     Status: None   Collection Time: 11/28/19  9:08 PM   Specimen: Nasopharyngeal  Result Value Ref Range Status   MRSA by PCR NEGATIVE NEGATIVE Final    Comment:        The GeneXpert MRSA Assay (FDA approved for NASAL specimens only), is one component of a comprehensive MRSA colonization surveillance program. It is not intended to diagnose MRSA infection nor to guide or monitor treatment  for MRSA infections. Performed at [facility], [address] [place], [place] [address]     Coagulation Studies: No results for input(s): LABPROT, INR in the last 72 hours.  Urinalysis: No results for input(s): COLORURINE, LABSPEC, PHURINE, GLUCOSEU, HGBUR, BILIRUBINUR, KETONESUR, PROTEINUR, UROBILINOGEN, NITRITE, LEUKOCYTESUR in the last 72 hours.  Invalid input(s): APPERANCEUR    Imaging: No results found.   Medications:   . sodium chloride    . sodium chloride Stopped (11/28/19 2021)  . sodium chloride    . sodium chloride    . amiodarone 30 mg/hr (12/06/19 0600)  . famotidine (PEPCID) IV Stopped (12/05/19 1027)  . norepinephrine (  LEVOPHED) Adult infusion Stopped (12/03/19 1646)  . potassium chloride     . sodium chloride   Intravenous Once  . chlorhexidine gluconate (MEDLINE KIT)  15 mL Mouth Rinse BID  . Chlorhexidine Gluconate Cloth  6 each Topical Q0600  . epoetin (EPOGEN/PROCRIT) injection  10,000 Units Intravenous Q T,Th,Sa-HD  . feeding supplement (NEPRO CARB STEADY)  237 mL Oral BID BM  . insulin aspart  0-5 Units Subcutaneous QHS  . insulin aspart  0-6 Units Subcutaneous TID WC  . mouth rinse  15 mL Mouth Rinse 10 times per day  . midazolam  2 mg Intravenous Once  . midodrine  10 mg Oral TID WC  . multivitamin  1 tablet Oral QHS  . polyethylene glycol  17 g Oral Daily  . sodium chloride flush  10-40 mL Intracatheter Q12H   sodium chloride, sodium chloride, alteplase, bisacodyl, fentaNYL (SUBLIMAZE) injection, heparin, lidocaine (PF), lidocaine-prilocaine, midazolam, midazolam, ondansetron (ZOFRAN) IV, pentafluoroprop-tetrafluoroeth, polyethylene glycol, sodium chloride flush  Assessment/ Plan:  [name] is a 86 y.o. 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 End-stage renal disease: dialysis yesterday. Tolerated treatment with 3K bath, BFR 300 and no ultrafiltration.  Tentatively plan on intermittent hemodialysis treatment for tomorrow  2. Hypokalemia: has a history of hyperkalemia. Unclear cause of hypokalemia.  Consider restarting ARB/ACE-I to help increase serum potassium levels when blood pressure allows - PO potassium replacement today.   3. Hypotension: with cardiac arrest and now cardiogenic shock. Holding home regimen of amlodipnie, hydralazine, isosorbide mononitrate, torsemide, and metoprolol.  Currently requiring norepinephrine and midodrine    4. Anemia with chronic kidney disease: receives Mircera as outpatient.  PRBC transfusion 6/29.  - EPO with HD treatment.    LOS: 8 Kerry [name] 7/2/20219:43 AM

## 2019-12-06 NOTE — Progress Notes (Signed)
RT note:  #6 cuffless shiley trach removed per discussion with CCMD.  Patient has stoma dressing in place, which wife agrees to bring in extras to clean/replace.  Patient in no distress.  Discussed importance of keeping ATC on while in hospital, to which patient and wife agree.

## 2019-12-06 NOTE — Progress Notes (Signed)
Patient Name: Kerry [name] Date of Encounter: 12/06/2019  Hospital Problem List     Active Problems:   Cardiac arrest [facility])    Patient Profile     86 y.o.malewith history of86 y.o.male patient who has a history of diabetes, hypertension,laryngeal carcinoma s/p tracheostomy,end-stage kidney disease currently on hemodialysis, hyperlipidemia and sleep apnea who was admitted after suffering a cardiac arrest during hemodialysis. Was in vf per report in dialysis and had two more cardiac arrest episodes while in the er. He currently is in the code ice protocol and is on iv amiodarone, iv levophed. He has a fairly complicated past medical history including he has undergone placement of a tracheostomy due to laryngeal carcinoma and resultant laryngectomy.Mental status is gradually improving.  Subjective     Inpatient Medications    . sodium chloride   Intravenous Once  . chlorhexidine gluconate (MEDLINE KIT)  15 mL Mouth Rinse BID  . Chlorhexidine Gluconate Cloth  6 each Topical Q0600  . epoetin (EPOGEN/PROCRIT) injection  10,000 Units Intravenous Q T,Th,Sa-HD  . feeding supplement (NEPRO CARB STEADY)  237 mL Oral BID BM  . insulin aspart  0-5 Units Subcutaneous QHS  . insulin aspart  0-6 Units Subcutaneous TID WC  . mouth rinse  15 mL Mouth Rinse 10 times per day  . midazolam  2 mg Intravenous Once  . midodrine  10 mg Oral TID WC  . multivitamin  1 tablet Oral QHS  . polyethylene glycol  17 g Oral Daily  . sodium chloride flush  10-40 mL Intracatheter Q12H    Vital Signs    Vitals:   12/06/19 0700 12/06/19 0800 12/06/19 0824 12/06/19 0831  BP: (!) 109/58 (!) 117/52    Pulse: (!) 56 61 (!) 59 71  Resp: (!) 23 (!) 26 (!) 23 16  Temp:   97.9 F (36.6 C)   TempSrc:   Oral   SpO2: 100% 99% 100% 100%  Weight:      Height:        Intake/Output Summary (Last 24 hours) at 12/06/2019 0926 Last data filed at 12/06/2019 0600 Gross per 24 hour  Intake 398.91 ml  Output 0 ml   Net 398.91 ml   Filed Weights   12/03/19 0446 12/04/19 0500 12/05/19 0454  Weight: 77.1 kg 74.4 kg 72.9 kg    Physical Exam    Labs    CBC Recent Labs    12/05/19 0507 12/06/19 0441  WBC 13.6* 12.7*  NEUTROABS 11.3* 10.7*  HGB 10.2* 10.1*  HCT 27.5* 27.8*  MCV 85.7 89.4  PLT 210 093   Basic Metabolic Panel Recent Labs    12/05/19 0507 12/06/19 0441  NA 140 139  K 3.4* 3.0*  CL 92* 93*  CO2 30 30  GLUCOSE 158* 168*  BUN 58* 42*  CREATININE 6.04* 5.23*  CALCIUM 8.0* 8.0*  MG 2.1 1.9  PHOS 5.2* 3.8   Liver Function Tests No results for input(s): AST, ALT, ALKPHOS, BILITOT, PROT, ALBUMIN in the last 72 hours. No results for input(s): LIPASE, AMYLASE in the last 72 hours. Cardiac Enzymes No results for input(s): CKTOTAL, CKMB, CKMBINDEX, TROPONINI in the last 72 hours. BNP No results for input(s): BNP in the last 72 hours. D-Dimer No results for input(s): DDIMER in the last 72 hours. Hemoglobin A1C No results for input(s): HGBA1C in the last 72 hours. Fasting Lipid Panel No results for input(s): CHOL, HDL, LDLCALC, TRIG, CHOLHDL, LDLDIRECT in the last 72 hours. Thyroid Function Tests  No results for input(s): TSH, T4TOTAL, T3FREE, THYROIDAB in the last 72 hours.  Invalid input(s): FREET3  Telemetry    nssr  ECG       Radiology    CT HEAD WO CONTRAST  Result Date: 11/28/2019 CLINICAL DATA:  Status post cardiac arrest. EXAM: CT HEAD WITHOUT CONTRAST TECHNIQUE: Contiguous axial images were obtained from the base of the skull through the vertex without intravenous contrast. COMPARISON:  None. FINDINGS: Brain: There is mild cerebral atrophy with widening of the extra-axial spaces and ventricular dilatation. There are areas of decreased attenuation within the white matter tracts of the supratentorial brain, consistent with microvascular disease changes. Vascular: No hyperdense vessel or unexpected calcification. Skull: Normal. Negative for fracture or focal  lesion. Sinuses/Orbits: No acute finding. Other: None. IMPRESSION: 1. Generalized cerebral atrophy. 2. No acute intracranial abnormality. Electronically Signed   By: Virgina Norfolk M.D.   On: 11/28/2019 18:06   DG Chest Port 1 View  Result Date: 12/02/2019 CLINICAL DATA:  Acute respiratory failure. EXAM: PORTABLE CHEST 1 VIEW COMPARISON:  11/30/2019 FINDINGS: The tracheostomy tube is stable. External pacer paddles are again noted. Stable mild cardiac enlargement and persistent lower lobe predominant airspace process and bilateral pleural effusions. IMPRESSION: Persistent lower lobe predominant airspace process and bilateral pleural effusions. Electronically Signed   By: Marijo Sanes M.D.   On: 12/02/2019 07:23   DG Chest Port 1 View  Result Date: 11/30/2019 CLINICAL DATA:  Acute respiratory failure EXAM: PORTABLE CHEST 1 VIEW COMPARISON:  11/28/2019 FINDINGS: Single frontal view of the chest demonstrates tracheostomy tube overlying tracheal air column tip at thoracic inlet. External defibrillator pads are again noted. Cardiac silhouette is unremarkable. Since the prior exam, there is decreased interstitial prominence. Persistent central vascular congestion with trace right pleural effusion. No pneumothorax. IMPRESSION: 1. Persistent central vascular congestion and trace right pleural effusion, with improving interstitial edema since prior exam. Electronically Signed   By: Randa Ngo M.D.   On: 11/30/2019 03:14   DG Chest Portable 1 View  Result Date: 11/28/2019 CLINICAL DATA:  Status post cardiac arrest during dialysis. EXAM: PORTABLE CHEST 1 VIEW COMPARISON:  Chest x-ray dated 07/19/2017 FINDINGS: Endotracheal tube in good position 4.7 cm above the carina. Heart size is normal. Slight bilateral haziness, left greater than right, likely represents mild edema. No consolidative infiltrates or effusions. No bone abnormality. IMPRESSION: 1. Endotracheal tube in good position. 2. Mild pulmonary edema.  Electronically Signed   By: Lorriane Shire M.D.   On: 11/28/2019 14:33   EEG adult  Result Date: 11/29/2019 Alexis Goodell, MD     11/29/2019  7:02 PM ELECTROENCEPHALOGRAM REPORT Patient: Markham Dumlao       Room #: IC17A-AA EEG No. ID: [id number] Age: 86 y.o.        Sex: male Requesting Physician: Kasa Report Date:  11/29/2019       Interpreting Physician: Alexis Goodell History: Romano Stigger is an 86 y.o. male s/p arrest Medications: Propofol, Fentanyl, Levophed, Insulin Conditions of Recording:  This is a 21 channel routine scalp EEG performed with bipolar and monopolar montages arranged in accordance to the international 10/20 system of electrode placement. One channel was dedicated to EKG recording. The patient is in the intubated and sedated state. Description:  The background activity is slow and poorly organized.  It consists of a low voltage polymorphic delta activity that is continuous and diffusely distributed.  No epileptiform activity is noted.  There is no activation of the background rhythm noted with  noxious stimuli.  Marland Kitchen  Hyperventilation and intermittent photic stimulation were not performed. IMPRESSION: This is an abnormal EEG secondary to general background slowing.  This finding may be seen with a diffuse cerebral disturbance that is etiologically nonspecific, but may include a medication effect, among other possibilities.  No epileptiform activity was noted.  Background lacked reactivity.  Alexis Goodell, MD Neurology [phone] 11/29/2019, 6:58 PM   ECHOCARDIOGRAM COMPLETE  Result Date: 11/30/2019    ECHOCARDIOGRAM REPORT   Patient Name:   [facility] Date of Exam: 11/30/2019 Medical Rec #:  [id number]     Height:       66.0 in Accession #:    [id number]    Weight:       157.0 lb Date of Birth:  11-03-1933     BSA:          1.804 m Patient Age:    31 years      BP:           114/55 mmHg Patient Gender: M             HR:           98 bpm. Exam Location:  [facility] Procedure: 2D Echo and  Intracardiac Opacification Agent Indications:     Cardiac Arrest I46.9  History:         Patient has no prior history of Echocardiogram examinations.  Sonographer:     Arville Go RDCS Referring Phys:  [id number] Awilda Bill Diagnosing Phys: Bartholome Bill MD  Sonographer Comments: Echo performed with patient supine and on artificial respirator and Technically difficult study due to poor echo windows. IMPRESSIONS  1. Left ventricular ejection fraction, by estimation, is 25 to 30%. Left ventricular ejection fraction by PLAX is 26 %. The left ventricle has severely decreased function. The left ventricle has no regional wall motion abnormalities. The left ventricular internal cavity size was moderately dilated. Left ventricular diastolic parameters are consistent with Grade I diastolic dysfunction (impaired relaxation).  2. Right ventricular systolic function is normal. The right ventricular size is mildly enlarged.  3. The mitral valve was not well visualized. Trivial mitral valve regurgitation.  4. The aortic valve was not well visualized. Aortic valve regurgitation is trivial. Mild aortic valve sclerosis is present, with no evidence of aortic valve stenosis. FINDINGS  Left Ventricle: Left ventricular ejection fraction, by estimation, is 25 to 30%. Left ventricular ejection fraction by PLAX is 26 %. The left ventricle has severely decreased function. The left ventricle has no regional wall motion abnormalities. Definity contrast agent was given IV to delineate the left ventricular endocardial borders. The left ventricular internal cavity size was moderately dilated. There is no left ventricular hypertrophy. Left ventricular diastolic parameters are consistent with Grade I diastolic dysfunction (impaired relaxation). Right Ventricle: The right ventricular size is mildly enlarged. No increase in right ventricular wall thickness. Right ventricular systolic function is normal. Left Atrium: Left atrial size was normal  in size. Right Atrium: Right atrial size was normal in size. Pericardium: There is no evidence of pericardial effusion. Mitral Valve: The mitral valve was not well visualized. Trivial mitral valve regurgitation. Tricuspid Valve: The tricuspid valve is not well visualized. Tricuspid valve regurgitation is mild. Aortic Valve: The aortic valve was not well visualized. Aortic valve regurgitation is trivial. Mild aortic valve sclerosis is present, with no evidence of aortic valve stenosis. Aortic valve peak gradient measures 6.4 mmHg. Pulmonic Valve: The pulmonic valve was not well visualized. Pulmonic valve regurgitation is  trivial. Aorta: The aortic root was not well visualized. IAS/Shunts: The interatrial septum was not assessed.  LEFT VENTRICLE PLAX 2D LV EF:         Left            Diastology                ventricular     LV e' lateral:   3.05 cm/s                ejection        LV E/e' lateral: 28.8                fraction by     LV e' medial:    5.00 cm/s                PLAX is 26      LV E/e' medial:  17.6                %. LVIDd:         4.98 cm LVIDs:         4.39 cm LV PW:         1.16 cm LV IVS:        1.11 cm LVOT diam:     2.00 cm LV SV:         43 LV SV Index:   24 LVOT Area:     3.14 cm  LV Volumes (MOD) LV vol d, MOD    182.0 ml A2C: LV vol d, MOD    159.0 ml A4C: LV vol s, MOD    136.0 ml A2C: LV vol s, MOD    126.0 ml A4C: LV SV MOD A2C:   46.0 ml LV SV MOD A4C:   159.0 ml LV SV MOD BP:    38.5 ml RIGHT VENTRICLE RV Basal diam:  3.65 cm RV S prime:     14.00 cm/s TAPSE (M-mode): 2.1 cm LEFT ATRIUM              Index       RIGHT ATRIUM           Index LA diam:        3.40 cm  1.88 cm/m  RA Area:     16.40 cm LA Vol (A2C):   119.0 ml 65.96 ml/m RA Volume:   45.90 ml  25.44 ml/m LA Vol (A4C):   83.3 ml  46.17 ml/m LA Biplane Vol: 99.1 ml  54.93 ml/m  AORTIC VALVE                PULMONIC VALVE AV Area (Vmax): 2.07 cm    PV Vmax:       1.14 m/s AV Vmax:        126.00 cm/s PV Peak grad:  5.2 mmHg AV  Peak Grad:   6.4 mmHg LVOT Vmax:      82.90 cm/s LVOT Vmean:     53.500 cm/s LVOT VTI:       0.137 m  AORTA Ao Root diam: 3.60 cm Ao Asc diam:  3.70 cm MITRAL VALVE MV Area (PHT): 4.15 cm     SHUNTS MV Decel Time: 183 msec     Systemic VTI:  0.14 m MV E velocity: 87.80 cm/s   Systemic Diam: 2.00 cm MV A velocity: 103.00 cm/s MV E/A ratio:  0.85 Bartholome Bill MD Electronically signed by Bartholome Bill MD Signature Date/Time: 11/30/2019/11:41:25 AM    Final  Assessment & Plan    86 year old male with history of end-stage renal disease on hemodialysis, history of laryngeal carcinoma status post tracheostomy, history of hyperlipidemia and sleep apnea who suffered a cardiac arrest during hemodialysis.  1. Cardiac arrest- primary VF arrest. May have been exacerbated by low serum potassium at 2.7. Patient's cardiac markers, high-sensitivity troponin are mildly elevated. Not surprising there is elevation given ventricular arrhythmia and resuscitation from this. Certainly coronary disease may be playing a role however not a candidate for invasive evaluation at present.Echo done this admission revealed ef of 25-30% which is not changed from his previous echo.We will continue to maintain potassium greater than 4 magnesium greater than 2. Currently on IV amiodaroneuntil able to take p.o. Again, will remain off of anticoagulation due to bleeding previously and anemia. Was on Plavixpreviouslyempirically for his coronary disease. Has runs of afib with rvr/vt on hd. Has resonded to bolus of amiodarone. Will continue with amiodarone. K is 3.0 today. Will  Still on norepinephriine. Also on midodrine.   2. End-stage renal disease-on hemodialysis. Continue nephrology evaluation and treatment.Creatinine6.04today. Potassium 3.0. Still hypotensive limiting acei use. Will need supp of potassium Will add kcl iv if ok with nephrology.   3. Elevated high-sensitivity troponin-likelysecondary to the  arrest. Acute coronaryeventis possible but given fairly blunted high-sensitivity troponinresponse, does not appear to be in a primary ischemic event. Wewill remain off of heparin or other anticoagulation/antiplatelet therapy due to bleeding and anemia.. Echo revealed no appreciable change in wall motion or ejection fraction from baseline.  4. Laryngeal carcinoma-stable. Has tracheostomy  5. Altered mental status-slowly improving.   6. Anemia-hgb 10.1 today  Signed, Javier Docker. Lennie Vasco MD 12/06/2019, 9:26 AM  Pager: [phone]

## 2019-12-06 NOTE — Hospital Course (Signed)
Patient Name: Kerry [name] Date of Encounter: 12/06/2019  Hospital Problem List     Active Problems:   Cardiac arrest [facility])    Patient Profile     ***  Subjective   ***  Inpatient Medications     sodium chloride   Intravenous Once   chlorhexidine gluconate (MEDLINE KIT)  15 mL Mouth Rinse BID   Chlorhexidine Gluconate Cloth  6 each Topical Q0600   epoetin (EPOGEN/PROCRIT) injection  10,000 Units Intravenous Q T,Th,Sa-HD   feeding supplement (NEPRO CARB STEADY)  237 mL Oral BID BM   insulin aspart  0-5 Units Subcutaneous QHS   insulin aspart  0-6 Units Subcutaneous TID WC   mouth rinse  15 mL Mouth Rinse 10 times per day   midazolam  2 mg Intravenous Once   midodrine  10 mg Oral TID WC   multivitamin  1 tablet Oral QHS   polyethylene glycol  17 g Oral Daily   sodium chloride flush  10-40 mL Intracatheter Q12H    Vital Signs    Vitals:   12/06/19 0700 12/06/19 0800 12/06/19 0824 12/06/19 0831  BP: (!) 109/58 (!) 117/52    Pulse: (!) 56 61 (!) 59 71  Resp: (!) 23 (!) 26 (!) 23 16  Temp:   97.9 F (36.6 C)   TempSrc:   Oral   SpO2: 100% 99% 100% 100%  Weight:      Height:        Intake/Output Summary (Last 24 hours) at 12/06/2019 0909 Last data filed at 12/06/2019 0600 Gross per 24 hour  Intake 398.91 ml  Output 0 ml  Net 398.91 ml   Filed Weights   12/03/19 0446 12/04/19 0500 12/05/19 0454  Weight: 77.1 kg 74.4 kg 72.9 kg    Physical Exam    GEN: Well nourished, well developed, in no acute distress.  HEENT: normal.  Neck: Supple, no JVD, carotid bruits, or masses. Cardiac: RRR, no murmurs, rubs, or gallops. No clubbing, cyanosis, edema.  Radials/DP/PT 2+ and equal bilaterally.  Respiratory:  Respirations regular and unlabored, clear to auscultation bilaterally. GI: Soft, nontender, nondistended, BS + x 4. MS: no deformity or atrophy. Skin: warm and dry, no rash. Neuro:  Strength and sensation are intact. Psych: Normal affect.  Labs     CBC Recent Labs    12/05/19 0507 12/06/19 0441  WBC 13.6* 12.7*  NEUTROABS 11.3* 10.7*  HGB 10.2* 10.1*  HCT 27.5* 27.8*  MCV 85.7 89.4  PLT 210 655   Basic Metabolic Panel Recent Labs    12/05/19 0507 12/06/19 0441  NA 140 139  K 3.4* 3.0*  CL 92* 93*  CO2 30 30  GLUCOSE 158* 168*  BUN 58* 42*  CREATININE 6.04* 5.23*  CALCIUM 8.0* 8.0*  MG 2.1 1.9  PHOS 5.2* 3.8   Liver Function Tests No results for input(s): AST, ALT, ALKPHOS, BILITOT, PROT, ALBUMIN in the last 72 hours. No results for input(s): LIPASE, AMYLASE in the last 72 hours. Cardiac Enzymes No results for input(s): CKTOTAL, CKMB, CKMBINDEX, TROPONINI in the last 72 hours. BNP No results for input(s): BNP in the last 72 hours. D-Dimer No results for input(s): DDIMER in the last 72 hours. Hemoglobin A1C No results for input(s): HGBA1C in the last 72 hours. Fasting Lipid Panel No results for input(s): CHOL, HDL, LDLCALC, TRIG, CHOLHDL, LDLDIRECT in the last 72 hours. Thyroid Function Tests No results for input(s): TSH, T4TOTAL, T3FREE, THYROIDAB in the last 72 hours.  Invalid  input(s): FREET3  Telemetry    ***  ECG    ***  Radiology    CT HEAD WO CONTRAST  Result Date: 11/28/2019 CLINICAL DATA:  Status post cardiac arrest. EXAM: CT HEAD WITHOUT CONTRAST TECHNIQUE: Contiguous axial images were obtained from the base of the skull through the vertex without intravenous contrast. COMPARISON:  None. FINDINGS: Brain: There is mild cerebral atrophy with widening of the extra-axial spaces and ventricular dilatation. There are areas of decreased attenuation within the white matter tracts of the supratentorial brain, consistent with microvascular disease changes. Vascular: No hyperdense vessel or unexpected calcification. Skull: Normal. Negative for fracture or focal lesion. Sinuses/Orbits: No acute finding. Other: None. IMPRESSION: 1. Generalized cerebral atrophy. 2. No acute intracranial abnormality.  Electronically Signed   By: Virgina Norfolk M.D.   On: 11/28/2019 18:06   DG Chest Port 1 View  Result Date: 12/02/2019 CLINICAL DATA:  Acute respiratory failure. EXAM: PORTABLE CHEST 1 VIEW COMPARISON:  11/30/2019 FINDINGS: The tracheostomy tube is stable. External pacer paddles are again noted. Stable mild cardiac enlargement and persistent lower lobe predominant airspace process and bilateral pleural effusions. IMPRESSION: Persistent lower lobe predominant airspace process and bilateral pleural effusions. Electronically Signed   By: Marijo Sanes M.D.   On: 12/02/2019 07:23   DG Chest Port 1 View  Result Date: 11/30/2019 CLINICAL DATA:  Acute respiratory failure EXAM: PORTABLE CHEST 1 VIEW COMPARISON:  11/28/2019 FINDINGS: Single frontal view of the chest demonstrates tracheostomy tube overlying tracheal air column tip at thoracic inlet. External defibrillator pads are again noted. Cardiac silhouette is unremarkable. Since the prior exam, there is decreased interstitial prominence. Persistent central vascular congestion with trace right pleural effusion. No pneumothorax. IMPRESSION: 1. Persistent central vascular congestion and trace right pleural effusion, with improving interstitial edema since prior exam. Electronically Signed   By: Randa Ngo M.D.   On: 11/30/2019 03:14   DG Chest Portable 1 View  Result Date: 11/28/2019 CLINICAL DATA:  Status post cardiac arrest during dialysis. EXAM: PORTABLE CHEST 1 VIEW COMPARISON:  Chest x-ray dated 07/19/2017 FINDINGS: Endotracheal tube in good position 4.7 cm above the carina. Heart size is normal. Slight bilateral haziness, left greater than right, likely represents mild edema. No consolidative infiltrates or effusions. No bone abnormality. IMPRESSION: 1. Endotracheal tube in good position. 2. Mild pulmonary edema. Electronically Signed   By: Lorriane Shire M.D.   On: 11/28/2019 14:33   EEG adult  Result Date: 11/29/2019 Alexis Goodell, MD      11/29/2019  7:02 PM ELECTROENCEPHALOGRAM REPORT Patient: Kerry [name]       Room #: IC17A-AA EEG No. ID: [id number] Age: 86 y.o.        Sex: male Requesting Physician: Kasa Report Date:  11/29/2019       Interpreting Physician: Alexis Goodell History: Emidio Warrell is an 86 y.o. male s/p arrest Medications: Propofol, Fentanyl, Levophed, Insulin Conditions of Recording:  This is a 21 channel routine scalp EEG performed with bipolar and monopolar montages arranged in accordance to the international 10/20 system of electrode placement. One channel was dedicated to EKG recording. The patient is in the intubated and sedated state. Description:  The background activity is slow and poorly organized.  It consists of a low voltage polymorphic delta activity that is continuous and diffusely distributed.  No epileptiform activity is noted.  There is no activation of the background rhythm noted with noxious stimuli.  Marland Kitchen  Hyperventilation and intermittent photic stimulation were not performed. IMPRESSION: This is  an abnormal EEG secondary to general background slowing.  This finding may be seen with a diffuse cerebral disturbance that is etiologically nonspecific, but may include a medication effect, among other possibilities.  No epileptiform activity was noted.  Background lacked reactivity.  Alexis Goodell, MD Neurology [phone] 11/29/2019, 6:58 PM   ECHOCARDIOGRAM COMPLETE  Result Date: 11/30/2019    ECHOCARDIOGRAM REPORT   Patient Name:   [facility] Date of Exam: 11/30/2019 Medical Rec #:  [id number]     Height:       66.0 in Accession #:    [id number]    Weight:       157.0 lb Date of Birth:  [birth year]     BSA:          1.804 m Patient Age:    16 years      BP:           114/55 mmHg Patient Gender: M             HR:           98 bpm. Exam Location:  [facility] Procedure: 2D Echo and Intracardiac Opacification Agent Indications:     Cardiac Arrest I46.9  History:         Patient has no prior history of Echocardiogram  examinations.  Sonographer:     Arville Go RDCS Referring Phys:  [id number] Awilda Bill Diagnosing Phys: Bartholome Bill MD  Sonographer Comments: Echo performed with patient supine and on artificial respirator and Technically difficult study due to poor echo windows. IMPRESSIONS  1. Left ventricular ejection fraction, by estimation, is 25 to 30%. Left ventricular ejection fraction by PLAX is 26 %. The left ventricle has severely decreased function. The left ventricle has no regional wall motion abnormalities. The left ventricular internal cavity size was moderately dilated. Left ventricular diastolic parameters are consistent with Grade I diastolic dysfunction (impaired relaxation).  2. Right ventricular systolic function is normal. The right ventricular size is mildly enlarged.  3. The mitral valve was not well visualized. Trivial mitral valve regurgitation.  4. The aortic valve was not well visualized. Aortic valve regurgitation is trivial. Mild aortic valve sclerosis is present, with no evidence of aortic valve stenosis. FINDINGS  Left Ventricle: Left ventricular ejection fraction, by estimation, is 25 to 30%. Left ventricular ejection fraction by PLAX is 26 %. The left ventricle has severely decreased function. The left ventricle has no regional wall motion abnormalities. Definity contrast agent was given IV to delineate the left ventricular endocardial borders. The left ventricular internal cavity size was moderately dilated. There is no left ventricular hypertrophy. Left ventricular diastolic parameters are consistent with Grade I diastolic dysfunction (impaired relaxation). Right Ventricle: The right ventricular size is mildly enlarged. No increase in right ventricular wall thickness. Right ventricular systolic function is normal. Left Atrium: Left atrial size was normal in size. Right Atrium: Right atrial size was normal in size. Pericardium: There is no evidence of pericardial effusion. Mitral Valve:  The mitral valve was not well visualized. Trivial mitral valve regurgitation. Tricuspid Valve: The tricuspid valve is not well visualized. Tricuspid valve regurgitation is mild. Aortic Valve: The aortic valve was not well visualized. Aortic valve regurgitation is trivial. Mild aortic valve sclerosis is present, with no evidence of aortic valve stenosis. Aortic valve peak gradient measures 6.4 mmHg. Pulmonic Valve: The pulmonic valve was not well visualized. Pulmonic valve regurgitation is trivial. Aorta: The aortic root was not well visualized. IAS/Shunts: The interatrial septum was not assessed.  LEFT VENTRICLE PLAX 2D LV EF:         Left            Diastology                ventricular     LV e' lateral:   3.05 cm/s                ejection        LV E/e' lateral: 28.8                fraction by     LV e' medial:    5.00 cm/s                PLAX is 26      LV E/e' medial:  17.6                %. LVIDd:         4.98 cm LVIDs:         4.39 cm LV PW:         1.16 cm LV IVS:        1.11 cm LVOT diam:     2.00 cm LV SV:         43 LV SV Index:   24 LVOT Area:     3.14 cm  LV Volumes (MOD) LV vol d, MOD    182.0 ml A2C: LV vol d, MOD    159.0 ml A4C: LV vol s, MOD    136.0 ml A2C: LV vol s, MOD    126.0 ml A4C: LV SV MOD A2C:   46.0 ml LV SV MOD A4C:   159.0 ml LV SV MOD BP:    38.5 ml RIGHT VENTRICLE RV Basal diam:  3.65 cm RV S prime:     14.00 cm/s TAPSE (M-mode): 2.1 cm LEFT ATRIUM              Index       RIGHT ATRIUM           Index LA diam:        3.40 cm  1.88 cm/m  RA Area:     16.40 cm LA Vol (A2C):   119.0 ml 65.96 ml/m RA Volume:   45.90 ml  25.44 ml/m LA Vol (A4C):   83.3 ml  46.17 ml/m LA Biplane Vol: 99.1 ml  54.93 ml/m  AORTIC VALVE                PULMONIC VALVE AV Area (Vmax): 2.07 cm    PV Vmax:       1.14 m/s AV Vmax:        126.00 cm/s PV Peak grad:  5.2 mmHg AV Peak Grad:   6.4 mmHg LVOT Vmax:      82.90 cm/s LVOT Vmean:     53.500 cm/s LVOT VTI:       0.137 m  AORTA Ao Root diam: 3.60 cm Ao  Asc diam:  3.70 cm MITRAL VALVE MV Area (PHT): 4.15 cm     SHUNTS MV Decel Time: 183 msec     Systemic VTI:  0.14 m MV E velocity: 87.80 cm/s   Systemic Diam: 2.00 cm MV A velocity: 103.00 cm/s MV E/A ratio:  0.85 Bartholome Bill MD Electronically signed by Bartholome Bill MD Signature Date/Time: 11/30/2019/11:41:25 AM    Final     Assessment & Plan    86 year old male with history of end-stage renal  disease on hemodialysis, history of laryngeal carcinoma status post tracheostomy, history of hyperlipidemia and sleep apnea who suffered a cardiac arrest during hemodialysis.   1.  Cardiac arrest-appears to be in a primary VF arrest.  May have been exacerbated by low serum potassium at 2.7.  Patient's cardiac markers, high-sensitivity troponin are mildly elevated.  Not surprising there is elevation given ventricular arrhythmia and resuscitation from this.  Certainly coronary disease may be playing a role however not a candidate for invasive evaluation at present.  Echo done this admission revealed ef of 25-30% which is not changed from his previous echo.  We will continue to maintain potassium greater than 4 magnesium greater than 2.  Currently on IV amiodarone until able to take p.o.   Again, will remain off of anticoagulation due to bleeding previously and anemia.  Was on Plavix previously empirically for his coronary disease.  Has runs of afib with rvr/vt on hd. Has resonded to bolus of amiodarone. Will continue with amiodarone as this appears to be the best option for him. Not candidate for aicd at present. K 3.0 and magnesium 1.9.    2.  End-stage renal disease-on hemodialysis.  Continue nephrology evaluation and treatment.  Creatinine 6.15 today.  Potassium 4.2.   3.  Elevated high-sensitivity troponin-likely secondary to the arrest.  Acute coronary event is possible but given fairly blunted high-sensitivity troponin response, does not appear to be in a primary ischemic event.  We will remain off of heparin  or other anticoagulation/antiplatelet therapy due to bleeding and anemia. .  Echo revealed no appreciable change in wall motion or ejection fraction from baseline.   4.  Laryngeal carcinoma-stable.  Has tracheostomy   5.  Altered mental status-slowly improving.  More animated this morning.  We will continue to follow.  Signed, Javier Docker Stpehanie Montroy MD 12/06/2019, 9:09 AM  Pager: [phone]

## 2019-12-06 NOTE — Evaluation (Signed)
Physical Therapy Evaluation Patient Details Name: Kerry [name] MRN: [id number] DOB: December 11, 1933 Today's Date: 12/06/2019   History of Present Illness  Pt is an 86 y.o. 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  Apparently patient lost pulse while having his the most recent dialysis session.  Patient had also developed A. fib RVR and was apneic in the field and required bag mask ventilation.  After arrival to the ER patient again coded x2 with V. fib arrest status post ACLS and ROSC  Clinical Impression  Prior to hospital admission, pt was independent with ambulation; lives with his wife on main level of home.  Pt noted to be in a-fib rhythm (73-124 bpm range).  Discussed pt's status with pt's nurse who cleared pt for participation in physical therapy (plan for bed level PT assessment and gentle ex's in bed).  Pt's wife reports pt recently used bed pan.  Pt tolerated x10 reps of ankle pumps and quad sets fairly well with rest break between ex's (HR briefly in 130's bpm but quickly decreased); after rest break, performed x3 reps active assistive L LE heel-slides and HR immediately increased to 143-144 bpm so ex's immediately stopped and HR trended back down to prior range.  Nurse updated on pt's session.  Pt would benefit from skilled PT to address noted impairments and functional limitations per pt tolerance (see below for any additional details).  Upon hospital discharge, anticipate pt would benefit from STR (will monitor pt's medical status and progress as able).     Follow Up Recommendations SNF    Equipment Recommendations  Rolling walker with 5" wheels;3in1 (PT)    Recommendations for Other Services       Precautions / Restrictions Precautions Precautions: Fall Precaution Comments: Trach; R femoral CVC; L UE fistula; aspiration; HOB >30 degrees Restrictions Weight Bearing Restrictions: No      Mobility  Bed Mobility               General bed mobility comments: Deferred bed mobility d/t HR concerns with activity  Transfers                 General transfer comment: Deferred transfers d/t HR concerns with activity  Ambulation/Gait             General Gait Details: Deferred gait d/t HR concerns with mobility  Stairs            Wheelchair Mobility    Modified Rankin (Stroke Patients Only)       Balance                                             Pertinent Vitals/Pain Pain Assessment: No/denies pain  BP 95/79 end of session O2 sats WFL on supplemental O2 during session.    Home Living Family/patient expects to be discharged to:: Private residence Living Arrangements: Spouse/significant other Available Help at Discharge: Family Type of Home: House Home Access: Stairs to enter Entrance Stairs-Rails: Psychiatric nurse of Steps: 2 Home Layout:  Two level;Able to live on main level with bedroom/bathroom Home Equipment: None      Prior Function Level of Independence: Independent               Hand Dominance        Extremity/Trunk Assessment   Upper Extremity Assessment Upper Extremity Assessment:  (good B hand grip strength; at least 3/5 AROM B elbow flexion/extension; B shoulder flexion AROM to grossly 110 degrees; pt denies any tingling/numbness in B UE's)    Lower Extremity Assessment Lower Extremity Assessment:  (at least 3/5 AROM DF/PF (normal ankle ROM); at least 3/5 AROM L hip flexion in bed; deferred R hip ROM d/t CVC R groin area; pt denies  any tingling/numbness B LE's)    Cervical / Trunk Assessment Cervical / Trunk Assessment: Normal  Communication   Communication: Tracheostomy  Cognition Arousal/Alertness: Awake/alert Behavior During Therapy: WFL for tasks assessed/performed Overall Cognitive Status: Within Functional Limits for tasks assessed                                        General Comments   Nursing cleared pt for participation in physical therapy.  Pt agreeable to PT session.  Pt's wife present most of session.  Pt requesting to sit up on edge of bed end of therapy session: therapist educated pt on HR concerns with minimal exertional activity in bed: functional activity/mobility was deferred to future therapy sessions as medically appropriate.    Exercises Total Joint Exercises Ankle Circles/Pumps: AROM;Strengthening;Both;10 reps;Supine Quad Sets: AROM;Strengthening;Both;10 reps;Supine Heel Slides: AAROM;Strengthening;Left;Supine (x3 reps)   Assessment/Plan    PT Assessment Patient needs continued PT services  PT Problem List Decreased strength;Decreased activity tolerance;Decreased balance;Decreased mobility;Decreased knowledge of use of DME;Cardiopulmonary status limiting activity;Decreased knowledge of precautions       PT Treatment Interventions DME instruction;Gait training;Stair training;Functional mobility training;Therapeutic activities;Therapeutic exercise;Balance training;Patient/family education    PT Goals (Current goals can be found in the Care Plan section)  Acute Rehab PT Goals Patient Stated Goal: to be able to sit up PT Goal Formulation: With patient Time For Goal Achievement: 12/20/19 Potential to Achieve Goals: Good    Frequency Min 2X/week   Barriers to discharge Decreased caregiver support      Co-evaluation               AM-PAC PT "6 Clicks" Mobility  Outcome Measure Help needed turning from your back to your side while in a flat bed without using  bedrails?: A Little Help needed moving from lying on your back to sitting on the side of a flat bed without using bedrails?: A Little Help needed moving to and from a bed to a chair (including a wheelchair)?: A Little Help needed standing up from a chair using your arms (e.g., wheelchair or bedside chair)?: A Lot Help needed to walk in hospital room?: A Lot Help needed climbing 3-5 steps with a railing? : Total 6 Click Score: 14    End of Session Equipment Utilized During Treatment: Oxygen Activity Tolerance: Other (comment) (limited d/t HR elevation with minimal activity) Patient left: in bed;with call bell/phone within reach;with family/visitor present;Other (comment);with SCD's reapplied (B heels floating via pillow support) Nurse Communication: Precautions;Other (comment) (pt's HR status during session) PT Visit Diagnosis: Other abnormalities of gait and mobility (R26.89);Muscle weakness (generalized) (M62.81);Difficulty in walking, not elsewhere classified (R26.2)    Time: 9326-7124 PT Time Calculation (  min) (ACUTE ONLY): 24 min   Charges:   PT Evaluation $PT Eval Low Complexity: 1 Low PT Treatments $Therapeutic Exercise: 8-22 mins       Leitha Bleak, PT 12/06/19, 3:53 PM

## 2019-12-06 NOTE — Progress Notes (Signed)
Noted MEWS score of 6 on this patient. Went to see patient in the ICU. He is resting quietly. No new complaints. Wife is at bedside eating lunch. Pt continues on norepinephrine gtt and amiodarone gtt. HR remains in the 120's. RR reported as 31, but I count 22 in the room when I visited. Last recorded bp was 95/62. He is saturating 95% on 5 liters. Continue to monitor.

## 2019-12-07 LAB — CBC
HCT: 26.7 % — ABNORMAL LOW (ref 39.0–52.0)
Hemoglobin: 9.6 g/dL — ABNORMAL LOW (ref 13.0–17.0)
MCH: 32.2 pg (ref 26.0–34.0)
MCHC: 36 g/dL (ref 30.0–36.0)
MCV: 89.6 fL (ref 80.0–100.0)
Platelets: 209 10*3/uL (ref 150–400)
RBC: 2.98 MIL/uL — ABNORMAL LOW (ref 4.22–5.81)
RDW: 17.6 % — ABNORMAL HIGH (ref 11.5–15.5)
WBC: 11.1 10*3/uL — ABNORMAL HIGH (ref 4.0–10.5)
nRBC: 2 % — ABNORMAL HIGH (ref 0.0–0.2)

## 2019-12-07 LAB — CBC WITH DIFFERENTIAL/PLATELET
Abs Immature Granulocytes: 0.08 10*3/uL — ABNORMAL HIGH (ref 0.00–0.07)
Basophils Absolute: 0 10*3/uL (ref 0.0–0.1)
Basophils Relative: 0 %
Eosinophils Absolute: 0.1 10*3/uL (ref 0.0–0.5)
Eosinophils Relative: 1 %
HCT: 25.9 % — ABNORMAL LOW (ref 39.0–52.0)
Hemoglobin: 9.4 g/dL — ABNORMAL LOW (ref 13.0–17.0)
Immature Granulocytes: 1 %
Lymphocytes Relative: 7 %
Lymphs Abs: 0.8 10*3/uL (ref 0.7–4.0)
MCH: 32.2 pg (ref 26.0–34.0)
MCHC: 36.3 g/dL — ABNORMAL HIGH (ref 30.0–36.0)
MCV: 88.7 fL (ref 80.0–100.0)
Monocytes Absolute: 0.7 10*3/uL (ref 0.1–1.0)
Monocytes Relative: 6 %
Neutro Abs: 9.6 10*3/uL — ABNORMAL HIGH (ref 1.7–7.7)
Neutrophils Relative %: 85 %
Platelets: 221 10*3/uL (ref 150–400)
RBC: 2.92 MIL/uL — ABNORMAL LOW (ref 4.22–5.81)
RDW: 17.3 % — ABNORMAL HIGH (ref 11.5–15.5)
WBC: 11.4 10*3/uL — ABNORMAL HIGH (ref 4.0–10.5)
nRBC: 2.4 % — ABNORMAL HIGH (ref 0.0–0.2)

## 2019-12-07 LAB — GLUCOSE, CAPILLARY
Glucose-Capillary: 231 mg/dL — ABNORMAL HIGH (ref 70–99)
Glucose-Capillary: 177 mg/dL — ABNORMAL HIGH (ref 70–99)
Glucose-Capillary: 193 mg/dL — ABNORMAL HIGH (ref 70–99)
Glucose-Capillary: 178 mg/dL — ABNORMAL HIGH (ref 70–99)

## 2019-12-07 LAB — HEMOGLOBIN A1C
Hgb A1c MFr Bld: 5.5 % (ref 4.8–5.6)
Mean Plasma Glucose: 111 mg/dL

## 2019-12-07 LAB — BASIC METABOLIC PANEL
Anion gap: 15 (ref 5–15)
BUN: 48 mg/dL — ABNORMAL HIGH (ref 8–23)
CO2: 30 mmol/L (ref 22–32)
Calcium: 8.3 mg/dL — ABNORMAL LOW (ref 8.9–10.3)
Chloride: 92 mmol/L — ABNORMAL LOW (ref 98–111)
Creatinine, Ser: 6.26 mg/dL — ABNORMAL HIGH (ref 0.61–1.24)
GFR calc Af Amer: 9 mL/min — ABNORMAL LOW (ref >60)
GFR calc non Af Amer: 7 mL/min — ABNORMAL LOW (ref >60)
Glucose, Bld: 258 mg/dL — ABNORMAL HIGH (ref 70–99)
Potassium: 3.5 mmol/L (ref 3.5–5.1)
Sodium: 137 mmol/L (ref 135–145)

## 2019-12-07 LAB — PHOSPHORUS: Phosphorus: 4.1 mg/dL (ref 2.5–4.6)

## 2019-12-07 LAB — MAGNESIUM: Magnesium: 1.9 mg/dL (ref 1.7–2.4)

## 2019-12-07 NOTE — Progress Notes (Signed)
This note also relates to the following rows which could not be included: Pulse Rate - Cannot attach notes to unvalidated device data BP - Cannot attach notes to unvalidated device data  Hd started  

## 2019-12-07 NOTE — Progress Notes (Signed)
[facility] Cardiology    SUBJECTIVE: Resting comfortably in bed with wife at his side blood pressure is somewhat better still in A. fib rate of about 70 still has a permanent trach patient had throat cancer.  Denies any pain feeling somewhat improved   Vitals:   12/07/19 0800 12/07/19 0848 12/07/19 0930 12/07/19 1000  BP: (!) 108/53  (!) 98/55 (!) 100/54  Pulse: 64  76 (!) 57  Resp: 14 18 12  (!) 30  Temp:  97.6 F (36.4 C)    TempSrc:  Axillary    SpO2: 98%  94% 100%  Weight:      Height:         Intake/Output Summary (Last 24 hours) at 12/07/2019 1152 Last data filed at 12/07/2019 4098 Gross per 24 hour  Intake 1389.75 ml  Output --  Net 1389.75 ml      PHYSICAL EXAM  General: Well developed, well nourished, in no acute distress HEENT:  Normocephalic and atramatic Neck:  No JVD.  Lungs: Clear bilaterally to auscultation and percussion. Heart: HRRR . Normal S1 and S2 without gallops or murmurs.  Abdomen: Bowel sounds are positive, abdomen soft and non-tender  Msk:  Back normal, normal gait. Normal strength and tone for age. Extremities: No clubbing, cyanosis or edema.   Neuro: Alert and oriented X 3. Psych:  Good affect, responds appropriately   LABS: Basic Metabolic Panel: Recent Labs    12/06/19 0441 12/07/19 0526  NA 139 137  K 3.0* 3.5  CL 93* 92*  CO2 30 30  GLUCOSE 168* 258*  BUN 42* 48*  CREATININE 5.23* 6.26*  CALCIUM 8.0* 8.3*  MG 1.9 1.9  PHOS 3.8 4.1   Liver Function Tests: No results for input(s): AST, ALT, ALKPHOS, BILITOT, PROT, ALBUMIN in the last 72 hours. No results for input(s): LIPASE, AMYLASE in the last 72 hours. CBC: Recent Labs    12/06/19 0441 12/06/19 0441 12/07/19 0526 12/07/19 0849  WBC 12.7*   < > 11.4* 11.1*  NEUTROABS 10.7*  --  9.6*  --   HGB 10.1*   < > 9.4* 9.6*  HCT 27.8*   < > 25.9* 26.7*  MCV 89.4   < > 88.7 89.6  PLT 224   < > 221 209   < > = values in this interval not displayed.   Cardiac Enzymes: No results  for input(s): CKTOTAL, CKMB, CKMBINDEX, TROPONINI in the last 72 hours. BNP: Invalid input(s): POCBNP D-Dimer: No results for input(s): DDIMER in the last 72 hours. Hemoglobin A1C: Recent Labs    12/06/19 0443  HGBA1C 5.5   Fasting Lipid Panel: No results for input(s): CHOL, HDL, LDLCALC, TRIG, CHOLHDL, LDLDIRECT in the last 72 hours. Thyroid Function Tests: No results for input(s): TSH, T4TOTAL, T3FREE, THYROIDAB in the last 72 hours.  Invalid input(s): FREET3 Anemia Panel: No results for input(s): VITAMINB12, FOLATE, FERRITIN, TIBC, IRON, RETICCTPCT in the last 72 hours.  CT ABDOMEN PELVIS W CONTRAST  Result Date: 12/06/2019 CLINICAL DATA:  Abdominal distention EXAM: CT ABDOMEN AND PELVIS WITH CONTRAST TECHNIQUE: Multidetector CT imaging of the abdomen and pelvis was performed using the standard protocol following bolus administration of intravenous contrast. CONTRAST:  144mL OMNIPAQUE IOHEXOL 300 MG/ML  SOLN COMPARISON:  None. FINDINGS: Lower chest: Moderate bilateral pleural effusions with compressive atelectasis in the lower lobes. Densely calcified visualized coronary arteries. Cardiomegaly. Hepatobiliary: Subtle layering high-density material within the gallbladder could reflect small stones or sludge/gravel. No focal hepatic abnormality. Pancreas: No focal abnormality or  ductal dilatation. Spleen: No focal abnormality.  Normal size. Adrenals/Urinary Tract: Kidneys are small bilaterally. Reflux of contrast from the right heart through the IVC into the right renal veins suggests right heart dysfunction. No hydronephrosis. No renal or adrenal mass. Urinary bladder decompressed. Stomach/Bowel: Colonic diverticulosis. No active diverticulitis. Stomach and small bowel decompressed, unremarkable. Moderate stool burden in the colon. Vascular/Lymphatic: Aortic atherosclerosis. No enlarged abdominal or pelvic lymph nodes. Reproductive: Mildly prominent prostate. Other: Small to moderate free fluid  in the abdomen and pelvis. Extensive subcutaneous edema throughout the subcutaneous soft tissues suggesting anasarca. Musculoskeletal: No acute bony abnormality. IMPRESSION: Moderate bilateral pleural effusions with compressive atelectasis in the lower lobes. Small kidneys bilaterally with poor excretion of contrast suggesting kidney disease. Aortic atherosclerosis. Small to moderate free fluid in the abdomen and pelvis. Diffuse anasarca like fluid throughout the subcutaneous soft tissues. Moderate stool burden in the colon.  Left colonic diverticulosis. Reflux of contrast from the right heart through the IVC into the hepatic veins and right renal vein suggesting right heart dysfunction. Electronically Signed   By: Rolm Baptise M.D.   On: 12/06/2019 18:45     Echo severely depressed left ventricular function EF between 25 to 30%  TELEMETRY: Atrial fibrillation rate of 70  ASSESSMENT AND PLAN:  Active Problems:   Cardiac arrest [facility]) Atrial fibrillation Moderate cardiomyopathy ejection fraction of 30% Respiratory failure Hypotension Atrial fibrillation Status post V. fib arrest Renal failure COPD Obstructive sleep apnea .  Plan Continue respiratory support and care Continue amiodarone therapy for A. fib and V. Fib Continue dialysis therapy for end-stage renal disease Agree with critical care management for V. fib arrest Consider outpatient evaluation for possible AICD Hypotension improving continue to wean pressors Hypokalemia consider restarting ARB/ACE Continue medical therapy  Yolonda Kida, MD 12/07/2019 11:52 AM

## 2019-12-07 NOTE — Progress Notes (Signed)
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   Apparently patient lost pulse while having his the most recent dialysis session. Patient had also developed A. fib RVR and was apneic in the field and required bag mask ventilation. After arrival to the ER patient again coded x2 with V. fib arrest status post ACLS and ROSC, found to be amiodarone and 20 MCG Levophed.[place] bedside. He underwent ACLS x 3. He was placed on the arctic sun protocol.  The patient required mechanical ventilation via tracheostomy. He was placed on an amiodarone gtt. Triple lumen catheter was placed. TTE was performed and demonstrated EF 25-30%. 26% LVEF by PLAX. Grade 1 diastolic dysfunction. RV is mildly enlarged.   The patient remains on low dose amiodarone gtt. Pressors off this morning per nursing. Blood pressure remains quite labile.  Consultants  . Cardiology . Nephrology . PCCM  Procedures  . Dialysis . Mechanical ventilation  Antibiotics   Anti-infectives (From admission, onward)   Start     Dose/Rate Route Frequency Ordered Stop   11/28/19 1545  ceFEPIme (MAXIPIME) 2 g in sodium chloride 0.9 % 100 mL IVPB        2 g 200 mL/hr over 30 Minutes Intravenous  Once 11/28/19 1536 11/28/19 1646   11/28/19 1545  vancomycin (VANCOCIN) IVPB 1000 mg/200 mL premix        1,000 mg 200 mL/hr over 60 Minutes Intravenous  Once 11/28/19 1536 11/28/19 1722      Subjective  The patient is resting comfortably. No new complaints.  Objective   Vitals:  Vitals:   12/07/19 1420 12/07/19 1430  BP: (!) 94/53 (!) 80/69  Pulse: 64 66  Resp:    Temp:    SpO2:     Exam:  Constitutional:  . The patient is being shaved by his wife. No acute distress. Respiratory:  . No increased work of breathing. . No wheezes, rales, or rhonchi . No tactile fremitus Cardiovascular:  . Regular rate and rhythm . No murmurs, ectopy, or gallups. . No lateral PMI. No thrills. Abdomen:  . Abdomen is soft, non-tender, non-distended . No hernias, masses, or organomegaly . Normoactive bowel sounds.  Musculoskeletal:  . No cyanosis, clubbing, or edema Skin:  . No rashes, lesions, ulcers . palpation of skin: no induration or nodules Neurologic:  . CN 2-12 intact . Sensation all 4 extremities intact Psychiatric:  . Mental status o Mood, affect appropriate o Orientation to person, place, time  . judgment and insight appear intact  I have personally reviewed the following:   Today's Data  . Vitals, CBC  Micro Data  . Blood cultures x 2: No growth  Scheduled Meds: . sodium chloride   Intravenous Once  . chlorhexidine gluconate (MEDLINE KIT)  15 mL Mouth Rinse BID  . Chlorhexidine Gluconate Cloth  6 each Topical Q0600  . epoetin (EPOGEN/PROCRIT) injection  10,000 Units Intravenous Q T,Th,Sa-HD  . feeding supplement (NEPRO CARB STEADY)  237 mL Oral BID BM  . insulin aspart  0-5 Units Subcutaneous  QHS  . insulin aspart  0-6 Units Subcutaneous TID WC  . mouth rinse  15 mL Mouth Rinse 10 times per day  . midazolam  2 mg Intravenous Once  . midodrine  10 mg Oral TID WC  . multivitamin  1 tablet Oral QHS  . polyethylene glycol  17 g Oral Daily  . sodium chloride flush  10-40 mL Intracatheter Q12H   Continuous Infusions: . sodium chloride Stopped (12/06/19 1834)  . sodium chloride Stopped (11/28/19 2021)  . sodium chloride    . sodium chloride    .  amiodarone 30 mg/hr (12/07/19 1127)  . famotidine (PEPCID) IV 20 mg (12/07/19 1021)  . norepinephrine (LEVOPHED) Adult infusion 2 mcg/min (12/07/19 0400)    Active Problems:   Cardiac arrest (HCC)   LOS: 9 days   A & P  Severe ACUTE Hypoxic and Hypercapnic Respiratory Failure: Pt has been taken off of the vent. He is now saturating 100% on 5L. He is receiving nebulizer treatments  Acute Systolic Cardiac Failure: EF 20%. Complicated by Atrial fibrillation with RVR and ischemic cardiomyopathy.  SYSTOLIC CARDIAC FAILURE- EF 20%. The patient continues to require IV amiodarone as he is still unable to take PO. No anticoagulation due to bleeding history and anemia. Cardiology does not feel that arrest was a primary ischemic event. Echo did not reveal changes in wall motion or EF.   ESRD on HD: As per nephrology. Continue HD. Medications will be renally dosed.  Cardiogenic Shock: CPatient is off of pressors, but blood pressures continue to drop into the 80's. Continue to monitor closely.  Laryngeal Carcinoma: Stable. Chronic tracheostomy  Altered Mental Status: Improving.  I have seen and examined this patient myself. I have spent 38 minutes in his evaluation and care.  DVT Prophylaxis: SCD's CODE STATUS: Full Code Family Communication: Wife at bedside Disposition: From Home. Anticipate discharge to SNF/Rehab.  Status is: Inpatient  Remains inpatient appropriate because:Inpatient level of care appropriate due to severity of illness  Dispo: The patient is from: Home              Anticipated d/c is to: To be determined.              Anticipated d/c date is: > 3 days              Patient currently is not medically stable to d/c.  Ava Swayze, DO [facility] Direct contact: see [email or web address]  7PM-7AM contact night coverage as above 12/06/2019, 2:41 PM  LOS: 7 days   ADDENDUM: Went to evaluate patient due to MEWS of 4. Score is due to recurrent hypotension. Continue to monitor  in Step-down status. He may require return to pressors.   

## 2019-12-07 NOTE — Progress Notes (Signed)
[facility]  ROUNDING NOTE   Subjective:   Wife at bedside.  Assisting with personal hygiene. Overall patient is doing fair Still has some arm and thigh edema Requiring high flow oxygen supplementation Has gurgling breathing sounds.   Objective:  Vital signs in last 24 hours:  Temp:  [97.6 F (36.4 C)-98 F (36.7 C)] 97.6 F (36.4 C) (07/03 0848) Pulse Rate:  [36-131] 64 (07/03 0800) Resp:  [0-38] 18 (07/03 0848) BP: (69-116)/(51-81) 108/53 (07/03 0800) SpO2:  [93 %-100 %] 98 % (07/03 0800) FiO2 (%):  [25 %-28 %] 28 % (07/03 0725) Weight:  [75.1 kg] 75.1 kg (07/03 0500)  Weight change:  Filed Weights   12/04/19 0500 12/05/19 0454 12/07/19 0500  Weight: 74.4 kg 72.9 kg 75.1 kg    Intake/Output: I/O last 3 completed shifts: In: 1805.5 [P.O.:360; I.V.:595.5; IV Piggyback:850] Out: -    Intake/Output this shift:  Total I/O In: 240 [P.O.:240] Out: -   Physical Exam: General:  No acute distress, laying in the bed  HEENT  anicteric, moist oral mucous membrane, status post laryngectomy,   Pulm/lungs  normal breathing effort, bilateral moderate crackles, tracheostomy  CVS/Heart  no rub or gallop, irregular rhythm, atrial fibrillation  Abdomen:   Soft, nontender  Extremities:  + Dependent peripheral edema in thighs and arms  Neurologic:  Alert, able to follow commands  Skin:  No acute rashes   Access: Left AVG    Basic Metabolic Panel: Recent Labs  Lab 12/03/19 0405 12/03/19 0405 12/04/19 0536 12/04/19 0536 12/05/19 0507 12/06/19 0441 12/07/19 0526  NA 138  --  139  --  140 139 137  K 4.2  --  4.0  --  3.4* 3.0* 3.5  CL 88*  --  91*  --  92* 93* 92*  CO2 33*  --  29  --  _0 GLUCOSE 165*  --  182*  --  158* 168* 258*  BUN 52*  --  44*  --  58* 42* 48*  CREATININE 6.15*  --  5.07*  --  6.04* 5.23* 6.26*  CALCIUM 8.1*   < > 8.0*   < > 8.0* 8.0* 8.3*  MG 2.0  --  2.0  --  2.1 1.9 1.9  PHOS 5.0*  --  5.3*  --  5.2* 3.8 4.1   < > =  values in this interval not displayed.    Liver Function Tests: Recent Labs  Lab 12/01/19 0356 12/02/19 0526  ALBUMIN 2.6* 2.7*   No results for input(s): LIPASE, AMYLASE in the last 168 hours. No results for input(s): AMMONIA in the last 168 hours.  CBC: Recent Labs  Lab 12/04/19 0536 12/05/19 0507 12/06/19 0441 12/07/19 0526 12/07/19 0849  WBC 12.2* 13.6* 12.7* 11.4* 11.1*  NEUTROABS 10.3* 11.3* 10.7* 9.6*  --   HGB 8.9* 10.2* 10.1* 9.4* 9.6*  HCT 24.5* 27.5* 27.8* 25.9* 26.7*  MCV 88.1 85.7 89.4 88.7 89.6  PLT 173 210 224 221 209    Cardiac Enzymes: No results for input(s): CKTOTAL, CKMB, CKMBINDEX, TROPONINI in the last 168 hours.  BNP: Invalid input(s): POCBNP  CBG: Recent Labs  Lab 12/06/19 1710 12/06/19 2009 12/06/19 2117 12/06/19 2327 12/07/19 0828  GLUCAP 251* 192* 175* 69* 231*    Microbiology: Results for orders placed or performed during the hospital encounter of 11/28/19  Culture, blood (routine x 2)     Status: None   Collection Time: 11/28/19  1:31 PM   Specimen:  BLOOD  Result Value Ref Range Status   Specimen Description BLOOD BLOOD RIGHT ARM  Final   Special Requests   Final    BOTTLES DRAWN AEROBIC AND ANAEROBIC Blood Culture adequate volume   Culture   Final    NO GROWTH 5 DAYS Performed at [facility], [address], [place], [place] [address]    Report Status 12/03/2019 FINAL  Final  Culture, blood (routine x 2)     Status: None   Collection Time: 11/28/19  2:17 PM   Specimen: BLOOD  Result Value Ref Range Status   Specimen Description BLOOD BLOOD RIGHT WRIST  Final   Special Requests   Final    BOTTLES DRAWN AEROBIC AND ANAEROBIC Blood Culture adequate volume   Culture   Final    NO GROWTH 5 DAYS Performed at [facility], [address] [place], [place] [address]    Report Status 12/03/2019 FINAL  Final  SARS Coronavirus 2 by RT PCR (hospital order, performed in [facility] hospital lab)  Nasopharyngeal Nasopharyngeal Swab     Status: None   Collection Time: 11/28/19  4:40 PM   Specimen: Nasopharyngeal Swab  Result Value Ref Range Status   SARS Coronavirus 2 NEGATIVE NEGATIVE Final    Comment: (NOTE) SARS-CoV-2 target nucleic acids are NOT DETECTED.  The SARS-CoV-2 RNA is generally detectable in upper and lower respiratory specimens during the acute phase of infection. The lowest concentration of SARS-CoV-2 viral copies this assay can detect is 250 copies / mL. A negative result does not preclude SARS-CoV-2 infection and should not be used as the sole basis for treatment or other patient management decisions.  A negative result may occur with improper specimen collection / handling, submission of specimen other than nasopharyngeal swab, presence of viral mutation(s) within the areas targeted by this assay, and inadequate number of viral copies (<250 copies / mL). A negative result must be combined with clinical observations, patient history, and epidemiological information.  Fact Sheet for Patients:   StrictlyIdeas.no  Fact Sheet for Healthcare Providers: BankingDealers.co.za  This test is not yet approved or  cleared by the [place] FDA and has been authorized for detection and/or diagnosis of SARS-CoV-2 by FDA under an Emergency Use Authorization (EUA).  This EUA will remain in effect (meaning this test can be used) for the duration of the COVID-19 declaration under Section 564(b)(1) of the Act, 21 U.S.C. section 360bbb-3(b)(1), unless the authorization is terminated or revoked sooner.  Performed at [facility], [address] [place], [place] [address]   MRSA PCR Screening     Status: None   Collection Time: 11/28/19  9:08 PM   Specimen: Nasopharyngeal  Result Value Ref Range Status   MRSA by PCR NEGATIVE NEGATIVE Final    Comment:        The GeneXpert MRSA Assay (FDA approved for NASAL  specimens only), is one component of a comprehensive MRSA colonization surveillance program. It is not intended to diagnose MRSA infection nor to guide or monitor treatment for MRSA infections. Performed at [facility], [address] [place], [place] [address]     Coagulation Studies: No results for input(s): LABPROT, INR in the last 72 hours.  Urinalysis: No results for input(s): COLORURINE, LABSPEC, PHURINE, GLUCOSEU, HGBUR, BILIRUBINUR, KETONESUR, PROTEINUR, UROBILINOGEN, NITRITE, LEUKOCYTESUR in the last 72 hours.  Invalid input(s): APPERANCEUR    Imaging: CT ABDOMEN PELVIS W CONTRAST  Result Date: 12/06/2019 CLINICAL DATA:  Abdominal distention EXAM: CT  ABDOMEN AND PELVIS WITH CONTRAST TECHNIQUE: Multidetector CT imaging of the abdomen and pelvis was performed using the standard protocol following bolus administration of intravenous contrast. CONTRAST:  116m OMNIPAQUE IOHEXOL 300 MG/ML  SOLN COMPARISON:  None. FINDINGS: Lower chest: Moderate bilateral pleural effusions with compressive atelectasis in the lower lobes. Densely calcified visualized coronary arteries. Cardiomegaly. Hepatobiliary: Subtle layering high-density material within the gallbladder could reflect small stones or sludge/gravel. No focal hepatic abnormality. Pancreas: No focal abnormality or ductal dilatation. Spleen: No focal abnormality.  Normal size. Adrenals/Urinary Tract: Kidneys are small bilaterally. Reflux of contrast from the right heart through the IVC into the right renal veins suggests right heart dysfunction. No hydronephrosis. No renal or adrenal mass. Urinary bladder decompressed. Stomach/Bowel: Colonic diverticulosis. No active diverticulitis. Stomach and small bowel decompressed, unremarkable. Moderate stool burden in the colon. Vascular/Lymphatic: Aortic atherosclerosis. No enlarged abdominal or pelvic lymph nodes. Reproductive: Mildly prominent prostate. Other: Small to moderate free  fluid in the abdomen and pelvis. Extensive subcutaneous edema throughout the subcutaneous soft tissues suggesting anasarca. Musculoskeletal: No acute bony abnormality. IMPRESSION: Moderate bilateral pleural effusions with compressive atelectasis in the lower lobes. Small kidneys bilaterally with poor excretion of contrast suggesting kidney disease. Aortic atherosclerosis. Small to moderate free fluid in the abdomen and pelvis. Diffuse anasarca like fluid throughout the subcutaneous soft tissues. Moderate stool burden in the colon.  Left colonic diverticulosis. Reflux of contrast from the right heart through the IVC into the hepatic veins and right renal vein suggesting right heart dysfunction. Electronically Signed   By: KRolm BaptiseM.D.   On: 12/06/2019 18:45     Medications:   . sodium chloride Stopped (12/06/19 1834)  . sodium chloride Stopped (11/28/19 2021)  . sodium chloride    . sodium chloride    . amiodarone 30 mg/hr (12/07/19 0400)  . famotidine (PEPCID) IV Stopped (12/06/19 2046)  . norepinephrine (LEVOPHED) Adult infusion 2 mcg/min (12/07/19 0400)   . sodium chloride   Intravenous Once  . chlorhexidine gluconate (MEDLINE KIT)  15 mL Mouth Rinse BID  . Chlorhexidine Gluconate Cloth  6 each Topical Q0600  . epoetin (EPOGEN/PROCRIT) injection  10,000 Units Intravenous Q T,Th,Sa-HD  . feeding supplement (NEPRO CARB STEADY)  237 mL Oral BID BM  . insulin aspart  0-5 Units Subcutaneous QHS  . insulin aspart  0-6 Units Subcutaneous TID WC  . mouth rinse  15 mL Mouth Rinse 10 times per day  . midazolam  2 mg Intravenous Once  . midodrine  10 mg Oral TID WC  . multivitamin  1 tablet Oral QHS  . polyethylene glycol  17 g Oral Daily  . sodium chloride flush  10-40 mL Intracatheter Q12H   sodium chloride, sodium chloride, alteplase, bisacodyl, fentaNYL (SUBLIMAZE) injection, heparin, HYDROmorphone (DILAUDID) injection, lidocaine (PF), lidocaine-prilocaine, midazolam, midazolam,  ondansetron (ZOFRAN) IV, pentafluoroprop-tetrafluoroeth, polyethylene glycol, sodium chloride flush  Assessment/ Plan:  Mr. MShariq Puigis a 86y.o. 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 End-stage renal disease: dialysis yesterday.  Patient's volume status is tenuous.  Ultrafiltration is limited by low blood pressure Will attempt UF only treatment today  with goal of 2 kg removal as tolerated Midodrine supplementation for hypotension  2. Hypokalemia:  Potassium in normal range today  3.  Atrial fibrillation On IV amiodarone.  Management as per cardiology  4. Anemia with chronic kidney disease: receives Mircera as outpatient.  PRBC transfusion 6/29.  - EPO with HD treatment.  Lab Results  Component Value Date   HGB 9.6 (L) 12/07/2019      LOS: 9 Ary Lavine 7/3/20219:34 AM

## 2019-12-07 NOTE — Progress Notes (Signed)
This note also relates to the following rows which could not be included: Pulse Rate - Cannot attach notes to unvalidated device data Resp - Cannot attach notes to unvalidated device data BP - Cannot attach notes to unvalidated device data  Hd completed  

## 2019-12-08 DIAGNOSIS — K5900 Constipation, unspecified: Secondary | ICD-10-CM

## 2019-12-08 DIAGNOSIS — I5023 Acute on chronic systolic (congestive) heart failure: Secondary | ICD-10-CM

## 2019-12-08 DIAGNOSIS — R570 Cardiogenic shock: Secondary | ICD-10-CM

## 2019-12-08 LAB — CBC
Hemoglobin: 9.8 g/dL — ABNORMAL LOW (ref 13.0–17.0)
Platelets: 201 10*3/uL (ref 150–400)
WBC: 10.6 10*3/uL — ABNORMAL HIGH (ref 4.0–10.5)
nRBC: 2.2 % — ABNORMAL HIGH (ref 0.0–0.2)

## 2019-12-08 LAB — BASIC METABOLIC PANEL
Anion gap: 18 — ABNORMAL HIGH (ref 5–15)
BUN: 61 mg/dL — ABNORMAL HIGH (ref 8–23)
CO2: 26 mmol/L (ref 22–32)
Calcium: 8.5 mg/dL — ABNORMAL LOW (ref 8.9–10.3)
Chloride: 92 mmol/L — ABNORMAL LOW (ref 98–111)
Creatinine, Ser: 7.07 mg/dL — ABNORMAL HIGH (ref 0.61–1.24)
GFR calc Af Amer: 7 mL/min — ABNORMAL LOW (ref >60)
GFR calc non Af Amer: 6 mL/min — ABNORMAL LOW (ref >60)
Glucose, Bld: 180 mg/dL — ABNORMAL HIGH (ref 70–99)
Potassium: 3.7 mmol/L (ref 3.5–5.1)
Sodium: 136 mmol/L (ref 135–145)

## 2019-12-08 LAB — GLUCOSE, CAPILLARY
Glucose-Capillary: 157 mg/dL — ABNORMAL HIGH (ref 70–99)
Glucose-Capillary: 188 mg/dL — ABNORMAL HIGH (ref 70–99)
Glucose-Capillary: 191 mg/dL — ABNORMAL HIGH (ref 70–99)
Glucose-Capillary: 222 mg/dL — ABNORMAL HIGH (ref 70–99)

## 2019-12-08 LAB — PHOSPHORUS: Phosphorus: 5.1 mg/dL — ABNORMAL HIGH (ref 2.5–4.6)

## 2019-12-08 LAB — MAGNESIUM: Magnesium: 2.1 mg/dL (ref 1.7–2.4)

## 2019-12-08 NOTE — Progress Notes (Signed)
RN at bedside aprox 0030. RN noted blood on bedding. Sheets pulled back and it was noted pt Right femoral central line was removed, sutures intact. Direct pressure was held for 5 min. Site cleaned and sterile occlusive dressing placed. Pt indicated to RN that he had gotten confused to where he was and pulled line by mistake. Pt reoriented to place and situation. RN attempted to gain IV access unsuccessfully x 2 in RUE. IV access team consulted and currently at bedside. LUE not option for access r/t fistula. Will continue to monitor.

## 2019-12-08 NOTE — Progress Notes (Signed)
Patient remains AO with wife at bedside.  He is saturating 95 and above on 5L Aerosol Trach collar 28%.  HR is 60's-80's in NSR.  Amiodarone infusing @ 30mg /hr.  He consumed around 50% of his breakfast, lunch, and dinner.  B/M x2.  Peri care.  Patient still complains of abdomen distention and the feeling to urinate.  No pain meds administered. No pain stated.  Patient stoma produced significant thick than/white mucus.  Patient suctioned well.  No events to note.

## 2019-12-08 NOTE — Progress Notes (Signed)
Patient upon assessment is AOx3.  He has trouble with time.  His wife is attentive and at bedside.  He moves his extremities well and has good grip in his hands.  His heart remains in NSR in the 60's however he experienced a-fib according to night RN.  He is on trach collar 5L 28% FiO2 saturating in the 90's w/ strong wave PLETH.  Dysphagia II diet and take spills with food.  B/Mx3 for night shift and one this morning after shift change.  +2 edema to extremities.  Anuric. Lft arm A/V fistula bruit and thrill auscultated/palpated.

## 2019-12-08 NOTE — Progress Notes (Signed)
[facility]  ROUNDING NOTE   Subjective:   Wife at bedside.  Patient underwent dialysis yesterday.  2 L of fluid was removed. Today, he looks and feels better  Objective:  Vital signs in last 24 hours:  Temp:  [97.4 F (36.3 C)-98 F (36.7 C)] 97.9 F (36.6 C) (07/04 0800) Pulse Rate:  [54-95] 62 (07/04 0800) Resp:  [14-77] 17 (07/04 0800) BP: (80-125)/(40-102) 113/62 (07/04 0800) SpO2:  [77 %-100 %] 97 % (07/04 0800) FiO2 (%):  [28 %] 28 % (07/04 0800) Weight:  [72.8 kg] 72.8 kg (07/04 0359)  Weight change: -2.3 kg Filed Weights   12/05/19 0454 12/07/19 0500 12/08/19 0359  Weight: 72.9 kg 75.1 kg 72.8 kg    Intake/Output: I/O last 3 completed shifts: In: 882.8 [P.O.:240; I.V.:592.8; IV Piggyback:50] Out: 2000 [Other:2000]   Intake/Output this shift:  Total I/O In: 14 [I.V.:11.2; IV Piggyback:2.8] Out: -   Physical Exam: General:  No acute distress, laying in the bed  HEENT  anicteric, moist oral mucous membrane, status post laryngectomy,   Pulm/lungs  normal breathing effort, bilateral moderate crackles, tracheostomy  CVS/Heart  no rub or gallop, irregular rhythm, atrial fibrillation  Abdomen:   Soft, nontender  Extremities:  + Dependent peripheral edema in thighs and arms  Neurologic:  Alert, able to follow commands  Skin:  No acute rashes   Access: Left AVG    Basic Metabolic Panel: Recent Labs  Lab 12/04/19 0536 12/04/19 0536 12/05/19 0507 12/05/19 0507 12/06/19 0441 12/07/19 0526 12/08/19 0725  NA 139  --  140  --  139 137 136  K 4.0  --  3.4*  --  3.0* 3.5 3.7  CL 91*  --  92*  --  93* 92* 92*  CO2 29  --  30  --  30 30 26   GLUCOSE 182*  --  158*  --  168* 258* 180*  BUN 44*  --  58*  --  42* 48* 61*  CREATININE 5.07*  --  6.04*  --  5.23* 6.26* 7.07*  CALCIUM 8.0*   < > 8.0*   < > 8.0* 8.3* 8.5*  MG 2.0  --  2.1  --  1.9 1.9 2.1  PHOS 5.3*  --  5.2*  --  3.8 4.1 5.1*   < > = values in this interval not displayed.    Liver  Function Tests: Recent Labs  Lab 12/02/19 0526  ALBUMIN 2.7*   No results for input(s): LIPASE, AMYLASE in the last 168 hours. No results for input(s): AMMONIA in the last 168 hours.  CBC: Recent Labs  Lab 12/04/19 0536 12/04/19 0536 12/05/19 0507 12/06/19 0441 12/07/19 0526 12/07/19 0849 12/08/19 0725  WBC 12.2*   < > 13.6* 12.7* 11.4* 11.1* 10.6*  NEUTROABS 10.3*  --  11.3* 10.7* 9.6*  --   --   HGB 8.9*   < > 10.2* 10.1* 9.4* 9.6* 9.8*  HCT 24.5*   < > 27.5* 27.8* 25.9* 26.7* RESULTS UNAVAILABLE DUE TO INTERFERING SUBSTANCE  MCV 88.1   < > 85.7 89.4 88.7 89.6 RESULTS UNAVAILABLE DUE TO INTERFERING SUBSTANCE  PLT 173   < > 210 224 221 209 201   < > = values in this interval not displayed.    Cardiac Enzymes: No results for input(s): CKTOTAL, CKMB, CKMBINDEX, TROPONINI in the last 168 hours.  BNP: Invalid input(s): POCBNP  CBG: Recent Labs  Lab 12/07/19 0828 12/07/19 1153 12/07/19 1641 12/07/19 2155 12/08/19 2637  [place]    Microbiology: Results for orders placed or performed during the hospital encounter of 11/28/19  Culture, blood (routine x 2)     Status: None   Collection Time: 11/28/19  1:31 PM   Specimen: BLOOD  Result Value Ref Range Status   Specimen Description BLOOD BLOOD RIGHT ARM  Final   Special Requests   Final    BOTTLES DRAWN AEROBIC AND ANAEROBIC Blood Culture adequate volume   Culture   Final    NO GROWTH 5 DAYS Performed at [facility], [address], [place], [place] [address]    Report Status 12/03/2019 FINAL  Final  Culture, blood (routine x 2)     Status: None   Collection Time: 11/28/19  2:17 PM   Specimen: BLOOD  Result Value Ref Range Status   Specimen Description BLOOD BLOOD RIGHT WRIST  Final   Special Requests   Final    BOTTLES DRAWN AEROBIC AND ANAEROBIC Blood Culture adequate volume   Culture   Final    NO GROWTH 5 DAYS Performed at [facility], [address]  [place], [place] [address]    Report Status 12/03/2019 FINAL  Final  SARS Coronavirus 2 by RT PCR (hospital order, performed in [place] hospital lab) Nasopharyngeal Nasopharyngeal Swab     Status: None   Collection Time: 11/28/19  4:40 PM   Specimen: Nasopharyngeal Swab  Result Value Ref Range Status   SARS Coronavirus 2 NEGATIVE NEGATIVE Final    Comment: (NOTE) SARS-CoV-2 target nucleic acids are NOT DETECTED.  The SARS-CoV-2 RNA is generally detectable in upper and lower respiratory specimens during the acute phase of infection. The lowest concentration of SARS-CoV-2 viral copies this assay can detect is 250 copies / mL. A negative result does not preclude SARS-CoV-2 infection and should not be used as the sole basis for treatment or other patient management decisions.  A negative result may occur with improper specimen collection / handling, submission of specimen other than nasopharyngeal swab, presence of viral mutation(s) within the areas targeted by this assay, and inadequate number of viral copies (<250 copies / mL). A negative result must be combined with clinical observations, patient history, and epidemiological information.  Fact Sheet for Patients:   StrictlyIdeas.no  Fact Sheet for Healthcare Providers: BankingDealers.co.za  This test is not yet approved or  cleared by the [place] FDA and has been authorized for detection and/or diagnosis of SARS-CoV-2 by FDA under an Emergency Use Authorization (EUA).  This EUA will remain in effect (meaning this test can be used) for the duration of the COVID-19 declaration under Section 564(b)(1) of the Act, 21 U.S.C. section 360bbb-3(b)(1), unless the authorization is terminated or revoked sooner.  Performed at [facility], [address] [place], [place] [address]   MRSA PCR Screening     Status: None   Collection Time: 11/28/19  9:08 PM   Specimen:  Nasopharyngeal  Result Value Ref Range Status   MRSA by PCR NEGATIVE NEGATIVE Final    Comment:        The GeneXpert MRSA Assay (FDA approved for NASAL specimens only), is one component of a comprehensive MRSA colonization surveillance program. It is not intended to diagnose MRSA infection nor to guide or monitor treatment for MRSA infections. Performed at [facility], [address] [place], [place] [address]     Coagulation Studies: No results for input(s): LABPROT, INR in the last 72 hours.  Urinalysis: No results for input(s): COLORURINE, LABSPEC, PHURINE, GLUCOSEU, HGBUR, BILIRUBINUR, KETONESUR, PROTEINUR, UROBILINOGEN, NITRITE, LEUKOCYTESUR in the last 72 hours.  Invalid input(s): APPERANCEUR    Imaging: CT ABDOMEN PELVIS W CONTRAST  Result Date: 12/06/2019 CLINICAL DATA:  Abdominal distention EXAM: CT ABDOMEN AND PELVIS WITH CONTRAST TECHNIQUE: Multidetector CT imaging of the abdomen and pelvis was performed using the standard protocol following bolus administration of intravenous contrast. CONTRAST:  167m OMNIPAQUE IOHEXOL 300 MG/ML  SOLN COMPARISON:  None. FINDINGS: Lower chest: Moderate bilateral pleural effusions with compressive atelectasis in the lower lobes. Densely calcified visualized coronary arteries. Cardiomegaly. Hepatobiliary: Subtle layering high-density material within the gallbladder could reflect small stones or sludge/gravel. No focal hepatic abnormality. Pancreas: No focal abnormality or ductal dilatation. Spleen: No focal abnormality.  Normal size. Adrenals/Urinary Tract: Kidneys are small bilaterally. Reflux of contrast from the right heart through the IVC into the right renal veins suggests right heart dysfunction. No hydronephrosis. No renal or adrenal mass. Urinary bladder decompressed. Stomach/Bowel: Colonic diverticulosis. No active diverticulitis. Stomach and small bowel decompressed, unremarkable. Moderate stool burden in the colon.  Vascular/Lymphatic: Aortic atherosclerosis. No enlarged abdominal or pelvic lymph nodes. Reproductive: Mildly prominent prostate. Other: Small to moderate free fluid in the abdomen and pelvis. Extensive subcutaneous edema throughout the subcutaneous soft tissues suggesting anasarca. Musculoskeletal: No acute bony abnormality. IMPRESSION: Moderate bilateral pleural effusions with compressive atelectasis in the lower lobes. Small kidneys bilaterally with poor excretion of contrast suggesting kidney disease. Aortic atherosclerosis. Small to moderate free fluid in the abdomen and pelvis. Diffuse anasarca like fluid throughout the subcutaneous soft tissues. Moderate stool burden in the colon.  Left colonic diverticulosis. Reflux of contrast from the right heart through the IVC into the hepatic veins and right renal vein suggesting right heart dysfunction. Electronically Signed   By: KRolm BaptiseM.D.   On: 12/06/2019 18:45     Medications:    sodium chloride Stopped (12/06/19 1834)   sodium chloride Stopped (11/28/19 2021)   sodium chloride     sodium chloride     amiodarone 30 mg/hr (12/08/19 0800)   famotidine (PEPCID) IV 100 mL/hr at 12/08/19 0800   norepinephrine (LEVOPHED) Adult infusion Stopped (12/07/19 0531)    sodium chloride   Intravenous Once   chlorhexidine gluconate (MEDLINE KIT)  15 mL Mouth Rinse BID   Chlorhexidine Gluconate Cloth  6 each Topical Q0600   epoetin (EPOGEN/PROCRIT) injection  10,000 Units Intravenous Q T,Th,Sa-HD   feeding supplement (NEPRO CARB STEADY)  237 mL Oral BID BM   insulin aspart  0-5 Units Subcutaneous QHS   insulin aspart  0-6 Units Subcutaneous TID WC   mouth rinse  15 mL Mouth Rinse 10 times per day   midazolam  2 mg Intravenous Once   midodrine  10 mg Oral TID WC   multivitamin  1 tablet Oral QHS   polyethylene glycol  17 g Oral Daily   sodium chloride flush  10-40 mL Intracatheter Q12H   sodium chloride, sodium chloride,  alteplase, bisacodyl, fentaNYL (SUBLIMAZE) injection, heparin, HYDROmorphone (DILAUDID) injection, lidocaine (PF), lidocaine-prilocaine, midazolam, midazolam, ondansetron (ZOFRAN) IV, pentafluoroprop-tetrafluoroeth, polyethylene glycol, sodium chloride flush  Assessment/ Plan:  Mr. MJaymond Waageis a 86y.o. 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 End-stage renal disease with volume overload:   2 L was able to be removed with HD yesterday. Plan for repeat HD (UF only treament ) on monday for more volume removal  Midodrine supplementation for hypotension  2. Hypokalemia:  Potassium in normal range today  3.  Atrial fibrillation On IV amiodarone.  Management as per cardiology  4. Anemia with chronic kidney disease: receives Mircera as outpatient.  PRBC transfusion 6/29.  - EPO with HD treatment.  Lab Results  Component Value Date   HGB 9.8 (L) 12/08/2019      LOS: 10 Adelee Hannula 7/4/202110:24 AM

## 2019-12-08 NOTE — Progress Notes (Signed)
[facility] Cardiology    SUBJECTIVE: Patient resting comfortably in bed wife at the bedside denied any significant chest pain shortness of breath is much improved had dialysis yesterday.  Denies any palpitations or tachycardia   Vitals:   12/08/19 1000 12/08/19 1100 12/08/19 1200 12/08/19 1300  BP: (!) 103/58 103/88 105/60 112/61  Pulse: 70 (!) 123 63 65  Resp: (!) 23 (!) 21 20 (!) 29  Temp:      TempSrc:      SpO2: (!) 88% (!) 88% 94% 90%  Weight:      Height:         Intake/Output Summary (Last 24 hours) at 12/08/2019 1400 Last data filed at 12/08/2019 1101 Gross per 24 hour  Intake 732.16 ml  Output 2000 ml  Net -1267.84 ml      PHYSICAL EXAM  General: Well developed, well nourished, in no acute distress HEENT:  Normocephalic and atramatic chronic trach Neck:  No JVD.  Lungs: Clear bilaterally to auscultation and percussion. Heart: HRRR . Normal S1 and S2 without gallops or murmurs.  Abdomen: Bowel sounds are positive, abdomen soft and non-tender  Msk:  Back normal, normal gait. Normal strength and tone for age. Extremities: No clubbing, cyanosis or edema.   Neuro: Alert and oriented X 3. Psych:  Good affect, responds appropriately   LABS: Basic Metabolic Panel: Recent Labs    12/07/19 0526 12/08/19 0725  NA 137 136  K 3.5 3.7  CL 92* 92*  CO2 30 26  GLUCOSE 258* 180*  BUN 48* 61*  CREATININE 6.26* 7.07*  CALCIUM 8.3* 8.5*  MG 1.9 2.1  PHOS 4.1 5.1*   Liver Function Tests: No results for input(s): AST, ALT, ALKPHOS, BILITOT, PROT, ALBUMIN in the last 72 hours. No results for input(s): LIPASE, AMYLASE in the last 72 hours. CBC: Recent Labs    12/06/19 0441 12/06/19 0441 12/07/19 0526 12/07/19 0526 12/07/19 0849 12/08/19 0725  WBC 12.7*   < > 11.4*   < > 11.1* 10.6*  NEUTROABS 10.7*  --  9.6*  --   --   --   HGB 10.1*   < > 9.4*   < > 9.6* 9.8*  HCT 27.8*   < > 25.9*   < > 26.7* RESULTS UNAVAILABLE DUE TO INTERFERING SUBSTANCE  MCV 89.4   < > 88.7   <  > 89.6 RESULTS UNAVAILABLE DUE TO INTERFERING SUBSTANCE  PLT 224   < > 221   < > 209 201   < > = values in this interval not displayed.   Cardiac Enzymes: No results for input(s): CKTOTAL, CKMB, CKMBINDEX, TROPONINI in the last 72 hours. BNP: Invalid input(s): POCBNP D-Dimer: No results for input(s): DDIMER in the last 72 hours. Hemoglobin A1C: Recent Labs    12/06/19 0443  HGBA1C 5.5   Fasting Lipid Panel: No results for input(s): CHOL, HDL, LDLCALC, TRIG, CHOLHDL, LDLDIRECT in the last 72 hours. Thyroid Function Tests: No results for input(s): TSH, T4TOTAL, T3FREE, THYROIDAB in the last 72 hours.  Invalid input(s): FREET3 Anemia Panel: No results for input(s): VITAMINB12, FOLATE, FERRITIN, TIBC, IRON, RETICCTPCT in the last 72 hours.  CT ABDOMEN PELVIS W CONTRAST  Result Date: 12/06/2019 CLINICAL DATA:  Abdominal distention EXAM: CT ABDOMEN AND PELVIS WITH CONTRAST TECHNIQUE: Multidetector CT imaging of the abdomen and pelvis was performed using the standard protocol following bolus administration of intravenous contrast. CONTRAST:  134mL OMNIPAQUE IOHEXOL 300 MG/ML  SOLN COMPARISON:  None. FINDINGS: Lower chest: Moderate bilateral  pleural effusions with compressive atelectasis in the lower lobes. Densely calcified visualized coronary arteries. Cardiomegaly. Hepatobiliary: Subtle layering high-density material within the gallbladder could reflect small stones or sludge/gravel. No focal hepatic abnormality. Pancreas: No focal abnormality or ductal dilatation. Spleen: No focal abnormality.  Normal size. Adrenals/Urinary Tract: Kidneys are small bilaterally. Reflux of contrast from the right heart through the IVC into the right renal veins suggests right heart dysfunction. No hydronephrosis. No renal or adrenal mass. Urinary bladder decompressed. Stomach/Bowel: Colonic diverticulosis. No active diverticulitis. Stomach and small bowel decompressed, unremarkable. Moderate stool burden in the  colon. Vascular/Lymphatic: Aortic atherosclerosis. No enlarged abdominal or pelvic lymph nodes. Reproductive: Mildly prominent prostate. Other: Small to moderate free fluid in the abdomen and pelvis. Extensive subcutaneous edema throughout the subcutaneous soft tissues suggesting anasarca. Musculoskeletal: No acute bony abnormality. IMPRESSION: Moderate bilateral pleural effusions with compressive atelectasis in the lower lobes. Small kidneys bilaterally with poor excretion of contrast suggesting kidney disease. Aortic atherosclerosis. Small to moderate free fluid in the abdomen and pelvis. Diffuse anasarca like fluid throughout the subcutaneous soft tissues. Moderate stool burden in the colon.  Left colonic diverticulosis. Reflux of contrast from the right heart through the IVC into the hepatic veins and right renal vein suggesting right heart dysfunction. Electronically Signed   By: Rolm Baptise M.D.   On: 12/06/2019 18:45     Echo depressed LV function around 20 to 25%  TELEMETRY: Normal sinus rhythm rate of 72  ASSESSMENT AND PLAN:  Active Problems:   Cardiac arrest (HCC) End-stage renal disease Respiratory failure Atrial fibrillation Multivessel coronary disease Congestive heart failure Obstructive sleep apnea Cardiomyopathy ejection fraction between 25 and 30% Hypoxic and hypercapnic respiratory failure    Plan Continue ICU level care Continue hemodialysis as per nephrology Switch IV amiodarone to p.o. when able Continue respiratory support Continue heart failure management and control for cardiomyopathy Consider outpatient AICD placement Recommend aggressive medical therapy Consider transfer to telemetry   Yolonda Kida, MD 12/08/2019 2:00 PM

## 2019-12-08 NOTE — Progress Notes (Signed)
PROGRESS NOTE  Javaughn Opdahl WGN:[id number] DOB: [birth year] DOA: 11/28/2019 PCP: Cecile Sheerer, MD  Brief History   Patient with advanced CHF and COPD, three-vessel CAD, sleep apnea, diverticulosis, end-stage renal failure on dialysis, squamous cell CA of upper and lower airway with vocal cord paralysis, status post laryngectomy with tracheostomy status who recently had signs of pneumonia with respiratory cultures and bronchoscopy done at [facility] with BAL showing noncryptococcal yeast forms and scattered bacterial cocci negative for AFB, came in after cardiac arrest.   Apparently patient lost pulse while having his the most recent dialysis session. Patient had also developed A. fib RVR and was apneic in the field and required bag mask ventilation. After arrival to the ER patient again coded x2 with V. fib arrest status post ACLS and ROSC, found to be amiodarone and 20 MCG Levophed.[place] bedside. He underwent ACLS x 3. He was placed on the arctic sun protocol.  The patient required mechanical ventilation via tracheostomy. He was placed on an amiodarone gtt. Triple lumen catheter was placed. TTE was performed and demonstrated EF 25-30%. 26% LVEF by PLAX. Grade 1 diastolic dysfunction. RV is mildly enlarged.   The patient remains on low dose amiodarone gtt. Pressors off this morning per nursing. Blood pressure have been stable since yesterday afternoon.  Consultants  . Cardiology . Nephrology . PCCM  Procedures  . Dialysis . Mechanical ventilation  Antibiotics   Anti-infectives (From admission, onward)   Start     Dose/Rate Route Frequency Ordered Stop   11/28/19 1545  ceFEPIme (MAXIPIME) 2 g in sodium chloride 0.9 % 100 mL IVPB        2 g 200 mL/hr over 30 Minutes Intravenous  Once 11/28/19 1536 11/28/19 1646   11/28/19 1545  vancomycin (VANCOCIN) IVPB 1000 mg/200 mL premix        1,000 mg 200 mL/hr over 60 Minutes Intravenous  Once 11/28/19  1536 11/28/19 1722     Subjective  The patient is resting comfortably. No new complaints.  Objective   Vitals:  Vitals:   12/08/19 0700 12/08/19 0800  BP: 119/85 113/62  Pulse: 65 62  Resp:  17  Temp:  97.9 F (36.6 C)  SpO2: 100% 97%   Exam:  Constitutional:  . The patient is resting comfortably. No acute distress. Respiratory:  . No increased work of breathing. . No wheezes, rales, or rhonchi . No tactile fremitus Cardiovascular:  . Regular rate and rhythm . No murmurs, ectopy, or gallups. . No lateral PMI. No thrills. Abdomen:  . Abdomen is soft, non-tender, non-distended . No hernias, masses, or organomegaly . Normoactive bowel sounds.  Musculoskeletal:  . No cyanosis, clubbing, or edema Skin:  . No rashes, lesions, ulcers . palpation of skin: no induration or nodules Neurologic:  . CN 2-12 intact . Sensation all 4 extremities intact Psychiatric:  . Mental status o Mood, affect appropriate o Orientation to person, place, time  . judgment and insight appear intact  I have personally reviewed the following:   Today's Data  . Vitals, CBC, BMP  Micro Data  . Blood cultures x 2: No growth  Scheduled Meds: . sodium chloride   Intravenous Once  . chlorhexidine gluconate (MEDLINE KIT)  15 mL Mouth Rinse BID  . Chlorhexidine Gluconate Cloth  6 each Topical Q0600  . epoetin (EPOGEN/PROCRIT) injection  10,000 Units Intravenous Q T,Th,Sa-HD  . feeding supplement (NEPRO CARB STEADY)  237 mL Oral BID BM  . insulin aspart  0-5  Units Subcutaneous QHS  . insulin aspart  0-6 Units Subcutaneous TID WC  . mouth rinse  15 mL Mouth Rinse 10 times per day  . midazolam  2 mg Intravenous Once  . midodrine  10 mg Oral TID WC  . multivitamin  1 tablet Oral QHS  . polyethylene glycol  17 g Oral Daily  . sodium chloride flush  10-40 mL Intracatheter Q12H   Continuous Infusions: . sodium chloride Stopped (12/06/19 1834)  . sodium chloride Stopped (11/28/19 2021)  .  sodium chloride    . sodium chloride    . amiodarone 30 mg/hr (12/08/19 0800)  . famotidine (PEPCID) IV 100 mL/hr at 12/08/19 0800  . norepinephrine (LEVOPHED) Adult infusion Stopped (12/07/19 0531)    Active Problems:   Cardiac arrest ([place])   LOS: 10 days   A & P  Severe ACUTE Hypoxic and Hypercapnic Respiratory Failure: Pt has been taken off of the vent. He is now saturating 94% on 5L. He is receiving nebulizer treatments  Acute Systolic Cardiac Failure: EF 20%. Complicated by Atrial fibrillation with RVR and ischemic cardiomyopathy.  SYSTOLIC CARDIAC FAILURE- EF 20%. The patient continues to require IV amiodarone as he is still unable to take PO. No anticoagulation due to bleeding history and anemia. Cardiology does not feel that arrest was a primary ischemic event. Echo did not reveal changes in wall motion or EF.   Hypotension: Pt is off of pressors. Yesterday the patient's blood pressures were occasionally low. They have been stable overnight. Will transfer to progressive care.  ESRD on HD: As per nephrology. Continue HD. Medications will be renally dosed.  Cardiogenic Shock: CPatient is off of pressors. Blood pressures are currently stable. Continue to monitor closely.  Laryngeal Carcinoma: Stable. Chronic tracheostomy  Altered Mental Status: Improving.  I have seen and examined this patient myself. I have spent 32 minutes in his evaluation and care.  DVT Prophylaxis: SCD's CODE STATUS: Full Code Family Communication: Wife at bedside Disposition: From Home. Anticipate discharge to SNF/Rehab.  Status is: Inpatient  Remains inpatient appropriate because:Inpatient level of care appropriate due to severity of illness  Dispo: The patient is from: Home              Anticipated d/c is to: To be determined.              Anticipated d/c date is: > 3 days              Patient currently is not medically stable to d/c.  Delorse Shane, DO [facility] Direct contact: see  [email or web address]  7PM-7AM contact night coverage as above 12/08/2019, 12:29 PM  LOS: 7 days   ADDENDUM: Went to evaluate patient due to MEWS of 4. Score is due to recurrent hypotension. Continue to monitor in Step-down status. He may require return to pressors.

## 2019-12-09 LAB — CBC
HCT: 27.7 % — ABNORMAL LOW (ref 39.0–52.0)
Hemoglobin: 10.3 g/dL — ABNORMAL LOW (ref 13.0–17.0)
MCH: 32.6 pg (ref 26.0–34.0)
MCHC: 37.2 g/dL — ABNORMAL HIGH (ref 30.0–36.0)
MCV: 87.7 fL (ref 80.0–100.0)
Platelets: 215 10*3/uL (ref 150–400)
RBC: 3.16 MIL/uL — ABNORMAL LOW (ref 4.22–5.81)
RDW: 18.2 % — ABNORMAL HIGH (ref 11.5–15.5)
WBC: 10.7 10*3/uL — ABNORMAL HIGH (ref 4.0–10.5)
nRBC: 1.2 % — ABNORMAL HIGH (ref 0.0–0.2)

## 2019-12-09 LAB — MAGNESIUM: Magnesium: 2.2 mg/dL (ref 1.7–2.4)

## 2019-12-09 LAB — BASIC METABOLIC PANEL
Anion gap: 17 — ABNORMAL HIGH (ref 5–15)
BUN: 68 mg/dL — ABNORMAL HIGH (ref 8–23)
CO2: 25 mmol/L (ref 22–32)
Calcium: 8.1 mg/dL — ABNORMAL LOW (ref 8.9–10.3)
Chloride: 92 mmol/L — ABNORMAL LOW (ref 98–111)
Creatinine, Ser: 7.6 mg/dL — ABNORMAL HIGH (ref 0.61–1.24)
GFR calc Af Amer: 7 mL/min — ABNORMAL LOW (ref >60)
GFR calc non Af Amer: 6 mL/min — ABNORMAL LOW (ref >60)
Glucose, Bld: 226 mg/dL — ABNORMAL HIGH (ref 70–99)
Potassium: 3.7 mmol/L (ref 3.5–5.1)
Sodium: 134 mmol/L — ABNORMAL LOW (ref 135–145)

## 2019-12-09 LAB — GLUCOSE, CAPILLARY
Glucose-Capillary: 210 mg/dL — ABNORMAL HIGH (ref 70–99)
Glucose-Capillary: 194 mg/dL — ABNORMAL HIGH (ref 70–99)
Glucose-Capillary: 161 mg/dL — ABNORMAL HIGH (ref 70–99)

## 2019-12-09 LAB — PHOSPHORUS: Phosphorus: 5.7 mg/dL — ABNORMAL HIGH (ref 2.5–4.6)

## 2019-12-09 NOTE — Progress Notes (Signed)
This note also relates to the following rows which could not be included: Resp - Cannot attach notes to unvalidated device data BP - Cannot attach notes to unvalidated device data  Hd copleted

## 2019-12-09 NOTE — Progress Notes (Signed)
[facility]    SUBJECTIVE: Patient is doing reasonably well wife at the bedside patient on his way to dialysis denies any pain shortness of breath is improved denies any palpitations or tachycardia telemetry showed normal sinus rhythm.  Still getting IV amiodarone unable to take p.o.. No active complaints   Vitals:   12/09/19 1345 12/09/19 1400 12/09/19 1415 12/09/19 1540  BP: (!) 107/54 (!) 114/39 (!) 110/40 (!) 111/50  Pulse: (!) 56   (!) 59  Resp: 16 16 16 18   Temp:    97.6 F (36.4 C)  TempSrc:      SpO2:      Weight:      Height:         Intake/Output Summary (Last 24 hours) at 12/09/2019 1904 Last data filed at 12/09/2019 1400 Gross per 24 hour  Intake 146.3 ml  Output 2000 ml  Net -1853.7 ml      PHYSICAL EXAM  General: Well developed, well nourished, in no acute distress HEENT:  Normocephalic and atramatic Neck:  No JVD.  Lungs: Clear bilaterally to auscultation and percussion. Heart: HRRR . Normal S1 and S2 without gallops or murmurs.  Abdomen: Bowel sounds are positive, abdomen soft and non-tender  Msk:  Back normal, normal gait. Normal strength and tone for age. Extremities: No clubbing, cyanosis or edema.   Neuro: Alert and oriented X 3. Psych:  Good affect, responds appropriately   LABS: Basic Metabolic Panel: Recent Labs    12/08/19 0725 12/09/19 1112  NA 136 134*  K 3.7 3.7  CL 92* 92*  CO2 26 25  GLUCOSE 180* 226*  BUN 61* 68*  CREATININE 7.07* 7.60*  CALCIUM 8.5* 8.1*  MG 2.1 2.2  PHOS 5.1* 5.7*   Liver Function Tests: No results for input(s): AST, ALT, ALKPHOS, BILITOT, PROT, ALBUMIN in the last 72 hours. No results for input(s): LIPASE, AMYLASE in the last 72 hours. CBC: Recent Labs    12/07/19 0526 12/07/19 0849 12/09/19 0817 12/09/19 1113  WBC 11.4*   < > RESULTS ENTERED IN ERROR 10.7*  NEUTROABS 9.6*  --   --   --   HGB 9.4*   < > TEST WILL BE CREDITED 10.3*  HCT 25.9*   < > TEST WILL BE CREDITED 27.7*  MCV 88.7   < >  TEST WILL BE CREDITED 87.7  PLT 221   < > TEST WILL BE CREDITED 215   < > = values in this interval not displayed.   Cardiac Enzymes: No results for input(s): CKTOTAL, CKMB, CKMBINDEX, TROPONINI in the last 72 hours. BNP: Invalid input(s): POCBNP D-Dimer: No results for input(s): DDIMER in the last 72 hours. Hemoglobin A1C: No results for input(s): HGBA1C in the last 72 hours. Fasting Lipid Panel: No results for input(s): CHOL, HDL, LDLCALC, TRIG, CHOLHDL, LDLDIRECT in the last 72 hours. Thyroid Function Tests: No results for input(s): TSH, T4TOTAL, T3FREE, THYROIDAB in the last 72 hours.  Invalid input(s): FREET3 Anemia Panel: No results for input(s): VITAMINB12, FOLATE, FERRITIN, TIBC, IRON, RETICCTPCT in the last 72 hours.  No results found.   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 amiodarone when patient able to take p.o. We will consider LifeVest but will probably have the patient referred to EP   Yolonda Kida, MD 12/09/2019 7:04 PM

## 2019-12-09 NOTE — Progress Notes (Signed)
PT Cancellation Note  Patient Details Name: Kerry [name] MRN: [id number] DOB: 1933-11-24   Cancelled Treatment:    Reason Eval/Treat Not Completed: Other (comment). Pt currently off unit for HD. Will re-attempt next available date.   Campbell Kray 12/09/2019, 1:57 PM  Greggory Stallion, PT, DPT [phone]

## 2019-12-09 NOTE — Progress Notes (Signed)
This note also relates to the following rows which could not be included: Resp - Cannot attach notes to unvalidated device data BP - Cannot attach notes to unvalidated device data  3 hours of Sequential Uf started.

## 2019-12-09 NOTE — Progress Notes (Signed)
[facility]  ROUNDING NOTE   Subjective:   Doing fair. Denies any acute complaints Seen during dialysis treatment   HEMODIALYSIS FLOWSHEET:  Blood Flow Rate (mL/min): 300 mL/min Arterial Pressure (mmHg): -100 mmHg Venous Pressure (mmHg): 180 mmHg Transmembrane Pressure (mmHg): 0 mmHg Ultrafiltration Rate (mL/min): 830 mL/min Dialysate Flow Rate (mL/min): 1 ml/min Conductivity: Machine : 14 Conductivity: Machine : 14 Dialysis Fluid Bolus: Normal Saline Bolus Amount (mL): 250 mL    Objective:  Vital signs in last 24 hours:  Temp:  [97.5 F (36.4 C)-98.3 F (36.8 C)] 98.2 F (36.8 C) (07/05 1100) Pulse Rate:  [61-119] 61 (07/05 1115) Resp:  [16-30] 16 (07/05 1115) BP: (82-114)/(27-94) 101/28 (07/05 1115) SpO2:  [90 %-100 %] 100 % (07/05 1100) FiO2 (%):  [28 %] 28 % (07/04 1600) Weight:  [72.7 kg] 72.7 kg (07/05 0446)  Weight change: -0.05 kg Filed Weights   12/07/19 0500 12/08/19 0359 12/09/19 0446  Weight: 75.1 kg 72.8 kg 72.7 kg    Intake/Output: I/O last 3 completed shifts: In: 1048.3 [P.O.:240; I.V.:708.2; IV Piggyback:100.1] Out: -    Intake/Output this shift:  Total I/O In: 10 [I.V.:10] Out: -   Physical Exam: General:  No acute distress, laying in the bed  HEENT  anicteric, moist oral mucous membrane, status post laryngectomy,   Pulm/lungs  normal breathing effort, bilateral mild crackles, tracheostomy  CVS/Heart  no rub or gallop, irregular rhythm, atrial fibrillation  Abdomen:   Soft, nontender  Extremities:  + Dependent peripheral edema in thighs and arms  Neurologic:  Alert, able to follow commands  Skin:  No acute rashes   Access: Left arm AVG    Basic Metabolic Panel: Recent Labs  Lab 12/05/19 0507 12/05/19 0507 12/06/19 0441 12/06/19 0441 12/07/19 0526 12/08/19 0725 12/09/19 1112  NA 140  --  139  --  137 136 134*  K 3.4*  --  3.0*  --  3.5 3.7 3.7  CL 92*  --  93*  --  92* 92* 92*  CO2 30  --  30  --  _0 GLUCOSE 158*  --  168*  --  258* 180* 226*  BUN 58*  --  42*  --  48* 61* 68*  CREATININE 6.04*  --  5.23*  --  6.26* 7.07* 7.60*  CALCIUM 8.0*   < > 8.0*   < > 8.3* 8.5* 8.1*  MG 2.1  --  1.9  --  1.9 2.1 2.2  PHOS 5.2*  --  3.8  --  4.1 5.1* 5.7*   < > = values in this interval not displayed.    Liver Function Tests: No results for input(s): AST, ALT, ALKPHOS, BILITOT, PROT, ALBUMIN in the last 168 hours. No results for input(s): LIPASE, AMYLASE in the last 168 hours. No results for input(s): AMMONIA in the last 168 hours.  CBC: Recent Labs  Lab 12/04/19 0536 12/04/19 0536 12/05/19 0507 12/05/19 0507 12/06/19 0441 12/06/19 0441 12/07/19 0526 12/07/19 0849 12/08/19 0725 12/09/19 0817 12/09/19 1113  WBC 12.2*   < > 13.6*   < > 12.7*   < > 11.4* 11.1* 10.6* RESULTS ENTERED IN ERROR 10.7*  NEUTROABS 10.3*  --  11.3*  --  10.7*  --  9.6*  --   --   --   --   HGB 8.9*   < > 10.2*   < > 10.1*   < > 9.4* 9.6* 9.8* TEST WILL BE CREDITED 10.3*  HCT 24.5*   < > 27.5*   < > 27.8*   < > 25.9* 26.7* RESULTS UNAVAILABLE DUE TO INTERFERING SUBSTANCE TEST WILL BE CREDITED 27.7*  MCV 88.1   < > 85.7   < > 89.4   < > 88.7 89.6 RESULTS UNAVAILABLE DUE TO INTERFERING SUBSTANCE TEST WILL BE CREDITED 87.7  PLT 173   < > 210   < > 224   < > 221 209 201 TEST WILL BE CREDITED 215   < > = values in this interval not displayed.    Cardiac Enzymes: No results for input(s): CKTOTAL, CKMB, CKMBINDEX, TROPONINI in the last 168 hours.  BNP: Invalid input(s): POCBNP  CBG: Recent Labs  Lab 12/08/19 0735 12/08/19 1144 12/08/19 1630 12/08/19 2119 12/09/19 0847  GLUCAP 157* 188* 191* 222* 210*    Microbiology: Results for orders placed or performed during the hospital encounter of 11/28/19  Culture, blood (routine x 2)     Status: None   Collection Time: 11/28/19  1:31 PM   Specimen: BLOOD  Result Value Ref Range Status   Specimen Description BLOOD BLOOD RIGHT ARM  Final   Special Requests    Final    BOTTLES DRAWN AEROBIC AND ANAEROBIC Blood Culture adequate volume   Culture   Final    NO GROWTH 5 DAYS Performed at [facility], [address] [place], [place] [address]    Report Status 12/03/2019 FINAL  Final  Culture, blood (routine x 2)     Status: None   Collection Time: 11/28/19  2:17 PM   Specimen: BLOOD  Result Value Ref Range Status   Specimen Description BLOOD BLOOD RIGHT WRIST  Final   Special Requests   Final    BOTTLES DRAWN AEROBIC AND ANAEROBIC Blood Culture adequate volume   Culture   Final    NO GROWTH 5 DAYS Performed at [facility], [address] [place], [place] [address]    Report Status 12/03/2019 FINAL  Final  SARS Coronavirus 2 by RT PCR (hospital order, performed in [facility] hospital lab) Nasopharyngeal Nasopharyngeal Swab     Status: None   Collection Time: 11/28/19  4:40 PM   Specimen: Nasopharyngeal Swab  Result Value Ref Range Status   SARS Coronavirus 2 NEGATIVE NEGATIVE Final    Comment: (NOTE) SARS-CoV-2 target nucleic acids are NOT DETECTED.  The SARS-CoV-2 RNA is generally detectable in upper and lower respiratory specimens during the acute phase of infection. The lowest concentration of SARS-CoV-2 viral copies this assay can detect is 250 copies / mL. A negative result does not preclude SARS-CoV-2 infection and should not be used as the sole basis for treatment or other patient management decisions.  A negative result may occur with improper specimen collection / handling, submission of specimen other than nasopharyngeal swab, presence of viral mutation(s) within the areas targeted by this assay, and inadequate number of viral copies (<250 copies / mL). A negative result must be combined with clinical observations, patient history, and epidemiological information.  Fact Sheet for Patients:   StrictlyIdeas.no  Fact Sheet for Healthcare  Providers: BankingDealers.co.za  This test is not yet approved or  cleared by the [place] FDA and has been authorized for detection and/or diagnosis of SARS-CoV-2 by FDA under an Emergency Use Authorization (EUA).  This EUA will remain in effect (meaning this test can be used) for the duration of the COVID-19 declaration under Section 564(b)(1) of the Act, 21 U.S.C. section 360bbb-3(b)(1), unless  the authorization is terminated or revoked sooner.  Performed at [facility], [address] [place], [place] [address]   MRSA PCR Screening     Status: None   Collection Time: 11/28/19  9:08 PM   Specimen: Nasopharyngeal  Result Value Ref Range Status   MRSA by PCR NEGATIVE NEGATIVE Final    Comment:        The GeneXpert MRSA Assay (FDA approved for NASAL specimens only), is one component of a comprehensive MRSA colonization surveillance program. It is not intended to diagnose MRSA infection nor to guide or monitor treatment for MRSA infections. Performed at [facility], [address] [place], [place] [address]     Coagulation Studies: No results for input(s): LABPROT, INR in the last 72 hours.  Urinalysis: No results for input(s): COLORURINE, LABSPEC, PHURINE, GLUCOSEU, HGBUR, BILIRUBINUR, KETONESUR, PROTEINUR, UROBILINOGEN, NITRITE, LEUKOCYTESUR in the last 72 hours.  Invalid input(s): APPERANCEUR    Imaging: No results found.   Medications:   . sodium chloride Stopped (12/06/19 1834)  . sodium chloride Stopped (11/28/19 2021)  . sodium chloride    . sodium chloride    . amiodarone 30 mg/hr (12/09/19 0200)  . famotidine (PEPCID) IV 20 mg (12/09/19 1007)   . sodium chloride   Intravenous Once  . chlorhexidine gluconate (MEDLINE KIT)  15 mL Mouth Rinse BID  . Chlorhexidine Gluconate Cloth  6 each Topical Q0600  . epoetin (EPOGEN/PROCRIT) injection  10,000 Units Intravenous Q T,Th,Sa-HD  . feeding supplement (NEPRO  CARB STEADY)  237 mL Oral BID BM  . insulin aspart  0-5 Units Subcutaneous QHS  . insulin aspart  0-6 Units Subcutaneous TID WC  . midodrine  10 mg Oral TID WC  . multivitamin  1 tablet Oral QHS  . sodium chloride flush  10-40 mL Intracatheter Q12H   sodium chloride, sodium chloride, alteplase, bisacodyl, heparin, HYDROmorphone (DILAUDID) injection, lidocaine (PF), lidocaine-prilocaine, ondansetron (ZOFRAN) IV, pentafluoroprop-tetrafluoroeth, polyethylene glycol, sodium chloride flush  Assessment/ Plan:  [name] is a 86 y.o. 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 End-stage renal disease with volume overload:   2 L was able to be removed with HD on Saturday. Plan for repeat  (UF only treament ) on monday for more volume removal  Midodrine and IV albumin supplementation for hypotension Plan for routine hemodialysis tomorrow  2. Hypokalemia:  Potassium in normal range today  3.  Atrial fibrillation  Management as per cardiology  4. Anemia with chronic kidney disease: receives Mircera as outpatient.  PRBC transfusion 6/29.  - EPO with scheduled HD treatment.  Lab Results  Component Value Date   HGB 10.3 (L) 12/09/2019      LOS: 11 Ervan Heber 7/5/202111:57 AM

## 2019-12-09 NOTE — Progress Notes (Signed)
PROGRESS NOTE  Kerry [name] CHE:[id number] DOB: [birth year] DOA: 11/28/2019 PCP: Cecile Sheerer, MD  Brief History   Patient with advanced CHF and COPD, three-vessel CAD, sleep apnea, diverticulosis, end-stage renal failure on dialysis, squamous cell CA of upper and lower airway with vocal cord paralysis, status post laryngectomy with tracheostomy status who recently had signs of pneumonia with respiratory cultures and bronchoscopy done at [facility] with BAL showing noncryptococcal yeast forms and scattered bacterial cocci negative for AFB, came in after cardiac arrest.   Apparently patient lost pulse while having his the most recent dialysis session. Patient had also developed A. fib RVR and was apneic in the field and required bag mask ventilation. After arrival to the ER patient again coded x2 with V. fib arrest status post ACLS and ROSC, found to be amiodarone and 20 MCG Levophed.[facility] bedside. He underwent ACLS x 3. He was placed on the arctic sun protocol.  The patient required mechanical ventilation via tracheostomy. He was placed on an amiodarone gtt. Triple lumen catheter was placed. TTE was performed and demonstrated EF 25-30%. 26% LVEF by PLAX. Grade 1 diastolic dysfunction. RV is mildly enlarged.   The patient remains on low dose amiodarone gtt. Pressors off this morning per nursing. Blood pressure have been stable since yesterday afternoon.  Consultants   Cardiology  Nephrology  PCCM  Procedures   Dialysis  Mechanical ventilation  Antibiotics   Anti-infectives (From admission, onward)   Start     Dose/Rate Route Frequency Ordered Stop   11/28/19 1545  ceFEPIme (MAXIPIME) 2 g in sodium chloride 0.9 % 100 mL IVPB        2 g 200 mL/hr over 30 Minutes Intravenous  Once 11/28/19 1536 11/28/19 1646   11/28/19 1545  vancomycin (VANCOCIN) IVPB 1000 mg/200 mL premix        1,000 mg 200 mL/hr over 60 Minutes Intravenous  Once 11/28/19  1536 11/28/19 1722     Subjective  The patient is resting comfortably. No new complaints.  Objective   Vitals:  Vitals:   12/09/19 1415 12/09/19 1540  BP: (!) 110/40 (!) 111/50  Pulse:  (!) 59  Resp: 16 18  Temp:  97.6 F (36.4 C)  SpO2:     Exam:  Constitutional:   The patient is resting comfortably. No acute distress. Respiratory:   No increased work of breathing.  No wheezes, rales, or rhonchi  No tactile fremitus Cardiovascular:   Regular rate and rhythm  No murmurs, ectopy, or gallups.  No lateral PMI. No thrills. Abdomen:   Abdomen is soft, non-tender, non-distended  No hernias, masses, or organomegaly  Normoactive bowel sounds.  Musculoskeletal:   No cyanosis, clubbing, or edema Skin:   No rashes, lesions, ulcers  palpation of skin: no induration or nodules Neurologic:   CN 2-12 intact  Sensation all 4 extremities intact Psychiatric:   Mental status o Mood, affect appropriate o Orientation to person, place, time   judgment and insight appear intact  I have personally reviewed the following:   Today's Data   Vitals, CBC, BMP  Micro Data   Blood cultures x 2: No growth  Scheduled Meds:  sodium chloride   Intravenous Once   chlorhexidine gluconate (MEDLINE KIT)  15 mL Mouth Rinse BID   Chlorhexidine Gluconate Cloth  6 each Topical Q0600   epoetin (EPOGEN/PROCRIT) injection  10,000 Units Intravenous Q T,Th,Sa-HD   feeding supplement (NEPRO CARB STEADY)  237 mL Oral BID BM   insulin  aspart  0-5 Units Subcutaneous QHS   insulin aspart  0-6 Units Subcutaneous TID WC   midodrine  10 mg Oral TID WC   multivitamin  1 tablet Oral QHS   sodium chloride flush  10-40 mL Intracatheter Q12H   Continuous Infusions:  sodium chloride Stopped (12/06/19 1834)   sodium chloride Stopped (11/28/19 2021)   sodium chloride     sodium chloride     amiodarone 30 mg/hr (12/09/19 1443)   famotidine (PEPCID) IV 20 mg (12/09/19 1007)     Active Problems:   Cardiac arrest ([place])   LOS: 11 days   A & P  Severe ACUTE Hypoxic and Hypercapnic Respiratory Failure: Pt has been taken off of the vent. He is now saturating 94% on 5L. He is receiving nebulizer treatments  Acute Systolic Cardiac Failure: EF 20%. Complicated by Atrial fibrillation with RVR and ischemic cardiomyopathy.  SYSTOLIC CARDIAC FAILURE- EF 20%. The patient continues to require IV amiodarone as he is still unable to take PO. No anticoagulation due to bleeding history and anemia. Cardiology does not feel that arrest was a primary ischemic event. Echo did not reveal changes in wall motion or EF.   Hypotension: Pt is off of pressors. Yesterday the patient's blood pressures were occasionally low. They have been stable overnight. Will transfer to progressive care.  ESRD on HD: As per nephrology. Continue HD. Medications will be renally dosed.  Cardiogenic Shock: CPatient is off of pressors. Blood pressures are currently stable. Continue to monitor closely.  Laryngeal Carcinoma: Stable. Chronic tracheostomy  Altered Mental Status: Improving.  I have seen and examined this patient myself. I have spent 34 minutes in his evaluation and care.  DVT Prophylaxis: SCD's CODE STATUS: Full Code Family Communication: Wife at bedside Disposition: From Home. Anticipate discharge to SNF/Rehab.  Status is: Inpatient  Remains inpatient appropriate because:Inpatient level of care appropriate due to severity of illness  Dispo: The patient is from: Home              Anticipated d/c is to: To be determined.              Anticipated d/c date is: > 3 days              Patient currently is not medically stable to d/c.  Denissa Cozart, DO [facility] Direct contact: see [email or web address]  7PM-7AM contact night coverage as above 12/09/2019, 6:54 PM  LOS: 7 days

## 2019-12-10 ENCOUNTER — Inpatient Hospital Stay: Payer: Medicare Other

## 2019-12-10 DIAGNOSIS — N184 Chronic kidney disease, stage 4 (severe): Secondary | ICD-10-CM

## 2019-12-10 DIAGNOSIS — Z9889 Other specified postprocedural states: Secondary | ICD-10-CM

## 2019-12-10 DIAGNOSIS — Z794 Long term (current) use of insulin: Secondary | ICD-10-CM

## 2019-12-10 DIAGNOSIS — E1169 Type 2 diabetes mellitus with other specified complication: Secondary | ICD-10-CM

## 2019-12-10 DIAGNOSIS — I5033 Acute on chronic diastolic (congestive) heart failure: Secondary | ICD-10-CM

## 2019-12-10 DIAGNOSIS — I2510 Atherosclerotic heart disease of native coronary artery without angina pectoris: Secondary | ICD-10-CM

## 2019-12-10 LAB — CBC
HCT: 28.9 % — ABNORMAL LOW (ref 39.0–52.0)
Hemoglobin: 10.8 g/dL — ABNORMAL LOW (ref 13.0–17.0)
MCH: 32.5 pg (ref 26.0–34.0)
MCHC: 37.4 g/dL — ABNORMAL HIGH (ref 30.0–36.0)
MCV: 87 fL (ref 80.0–100.0)
Platelets: 192 10*3/uL (ref 150–400)
RBC: 3.32 MIL/uL — ABNORMAL LOW (ref 4.22–5.81)
RDW: 19.3 % — ABNORMAL HIGH (ref 11.5–15.5)
WBC: 11.3 10*3/uL — ABNORMAL HIGH (ref 4.0–10.5)
nRBC: 0.7 % — ABNORMAL HIGH (ref 0.0–0.2)

## 2019-12-10 LAB — MAGNESIUM: Magnesium: 2.1 mg/dL (ref 1.7–2.4)

## 2019-12-10 LAB — GLUCOSE, CAPILLARY
Glucose-Capillary: 157 mg/dL — ABNORMAL HIGH (ref 70–99)
Glucose-Capillary: 173 mg/dL — ABNORMAL HIGH (ref 70–99)

## 2019-12-10 LAB — BASIC METABOLIC PANEL
Anion gap: 21 — ABNORMAL HIGH (ref 5–15)
BUN: 77 mg/dL — ABNORMAL HIGH (ref 8–23)
CO2: 24 mmol/L (ref 22–32)
Calcium: 8.1 mg/dL — ABNORMAL LOW (ref 8.9–10.3)
Chloride: 90 mmol/L — ABNORMAL LOW (ref 98–111)
Creatinine, Ser: 8.61 mg/dL — ABNORMAL HIGH (ref 0.61–1.24)
GFR calc Af Amer: 6 mL/min — ABNORMAL LOW (ref >60)
GFR calc non Af Amer: 5 mL/min — ABNORMAL LOW (ref >60)
Glucose, Bld: 156 mg/dL — ABNORMAL HIGH (ref 70–99)
Potassium: 3.7 mmol/L (ref 3.5–5.1)
Sodium: 135 mmol/L (ref 135–145)

## 2019-12-10 LAB — PHOSPHORUS: Phosphorus: 6.4 mg/dL — ABNORMAL HIGH (ref 2.5–4.6)

## 2019-12-10 LAB — PROTIME-INR
INR: 1.1 (ref 0.8–1.2)
Prothrombin Time: 13.7 seconds (ref 11.4–15.2)

## 2019-12-10 LAB — APTT: aPTT: 41 seconds — ABNORMAL HIGH (ref 24–36)

## 2019-12-10 MED ORDER — HEPARIN (PORCINE) 25000 UT/250ML-% IV SOLN
1150.0000 [IU]/h | INTRAVENOUS | Status: DC
  Administered 2019-12-11: 1150 [IU]/h via INTRAVENOUS
  Filled 2019-12-10: qty 250

## 2019-12-10 MED ORDER — EPOETIN ALFA 2000 UNIT/ML IJ SOLN
2000.0000 [IU] | INTRAMUSCULAR | Status: DC
  Administered 2019-12-12: 2000 [IU] via INTRAVENOUS
  Filled 2019-12-10 (×3): qty 1

## 2019-12-10 MED ORDER — HEPARIN (PORCINE) 25000 UT/250ML-% IV SOLN
1150.0000 [IU]/h | INTRAVENOUS | Status: DC
  Filled 2019-12-10 (×3): qty 250

## 2019-12-10 MED ORDER — AMIODARONE HCL 200 MG PO TABS
100.0000 mg | ORAL_TABLET | Freq: Every day | ORAL | Status: DC
  Administered 2019-12-10 – 2019-12-17 (×8): 100 mg via ORAL
  Filled 2019-12-10 (×8): qty 1

## 2019-12-10 MED ORDER — HEPARIN BOLUS VIA INFUSION
4500.0000 [IU] | Freq: Once | INTRAVENOUS | Status: DC
  Filled 2019-12-10: qty 4500

## 2019-12-10 MED ORDER — HEPARIN BOLUS VIA INFUSION
4500.0000 [IU] | Freq: Once | INTRAVENOUS | Status: AC
  Administered 2019-12-11: 4500 [IU] via INTRAVENOUS
  Filled 2019-12-10: qty 4500

## 2019-12-10 NOTE — Progress Notes (Signed)
   12/10/19 1330  Clinical Encounter Type  Visited With Patient and family together  Visit Type Follow-up  Referral From Chaplain  Consult/Referral To Chaplain  Chaplain stopped in to check on patient and wife explained some things that are going on. Chaplain asked if she could pray with them and both said yes. Chaplain prayed, transport came to take patient, and chaplain asked Mrs. Camuso if there was anything she needed and she said no. Chaplain will follow up tomorrow.

## 2019-12-10 NOTE — Progress Notes (Signed)
Patient arrived from dialysis at 1900. No complaints of pain. Patient is hypotensive. Blood pressure is being measured in legs. Increased frequency of vitals. Patient is asymptomatic. Unable to use arms due to right  jugular occulusion and a/v fistula placement in left arm. NP Randol Kern does not want any blood pressures, lines and blood draws performed in right arm. Received order to perform lab draws in foot. IV team is unable to place an iv in leg due to history of diabetes. Vascular consult has been placed to evaluate for a central line access. Heparin gtt has been delayed until an access has been established.

## 2019-12-10 NOTE — Progress Notes (Signed)
[facility] Cardiology    SUBJECTIVE: Hypertension status post dialysis recommend mild hydration denies any chest pain or angina no leg edema patient states to be doing reasonably well wife at the bedside.  Continue therapy for atrial fibrillation on amiodarone   Vitals:   12/10/19 1914 12/10/19 2000 12/10/19 2030 12/10/19 2100  BP: (!) 69/46 (!) 66/38 91/72 96/77   Pulse: 65     Resp: 15 (!) 28 (!) 22 (!) 21  Temp: 98.3 F (36.8 C)     TempSrc: Oral     SpO2:      Weight:      Height:         Intake/Output Summary (Last 24 hours) at 12/10/2019 2332 Last data filed at 12/10/2019 2222 Gross per 24 hour  Intake 133.26 ml  Output 1000 ml  Net -866.74 ml      PHYSICAL EXAM  General: Well developed, well nourished, in no acute distress HEENT:  Normocephalic and atramatic.  Tracheostomy in place Neck:  No JVD.  Lungs: Clear bilaterally to auscultation and percussion. Heart: HRRR . Normal S1 and S2 without gallops or murmurs.  Abdomen: Bowel sounds are positive, abdomen soft and non-tender  Msk:  Back normal, normal gait. Normal strength and tone for age. Extremities: No clubbing, cyanosis or edema.   Neuro: Alert and oriented X 3. Psych:  Good affect, responds appropriately   LABS: Basic Metabolic Panel: Recent Labs    12/09/19 1112 12/10/19 0606  NA 134* 135  K 3.7 3.7  CL 92* 90*  CO2 25 24  GLUCOSE 226* 156*  BUN 68* 77*  CREATININE 7.60* 8.61*  CALCIUM 8.1* 8.1*  MG 2.2 2.1  PHOS 5.7* 6.4*   Liver Function Tests: No results for input(s): AST, ALT, ALKPHOS, BILITOT, PROT, ALBUMIN in the last 72 hours. No results for input(s): LIPASE, AMYLASE in the last 72 hours. CBC: Recent Labs    12/09/19 1113 12/10/19 0606  WBC 10.7* 11.3*  HGB 10.3* 10.8*  HCT 27.7* 28.9*  MCV 87.7 87.0  PLT 215 192   Cardiac Enzymes: No results for input(s): CKTOTAL, CKMB, CKMBINDEX, TROPONINI in the last 72 hours. BNP: Invalid input(s): POCBNP D-Dimer: No results for input(s):  DDIMER in the last 72 hours. Hemoglobin A1C: No results for input(s): HGBA1C in the last 72 hours. Fasting Lipid Panel: No results for input(s): CHOL, HDL, LDLCALC, TRIG, CHOLHDL, LDLDIRECT in the last 72 hours. Thyroid Function Tests: No results for input(s): TSH, T4TOTAL, T3FREE, THYROIDAB in the last 72 hours.  Invalid input(s): FREET3 Anemia Panel: No results for input(s): VITAMINB12, FOLATE, FERRITIN, TIBC, IRON, RETICCTPCT in the last 72 hours.  US Venous Img Upper Uni Right(DVT)  Result Date: 12/10/2019 CLINICAL DATA:  Right upper extremity pain and. History of end-stage renal disease. Former smoker. Evaluate for DVT. EXAM: RIGHT UPPER EXTREMITY VENOUS DOPPLER ULTRASOUND TECHNIQUE: Gray-scale sonography with graded compression, as well as color Doppler and duplex ultrasound were performed to evaluate the upper extremity deep venous system from the level of the subclavian vein and including the jugular, axillary, basilic, radial, ulnar and upper cephalic vein. Spectral Doppler was utilized to evaluate flow at rest and with distal augmentation maneuvers. COMPARISON:  Chest radiograph-01/01/2020 FINDINGS: Contralateral Subclavian Vein: Respiratory phasicity is normal and symmetric with the symptomatic side. No evidence of thrombus. Normal compressibility. Internal Jugular Vein: There is mixed echogenic occlusive thrombus within the right internal jugular vein (images 2 through 5). Subclavian Vein: No evidence of thrombus. Normal compressibility, respiratory phasicity and response  to augmentation. Axillary Vein: No evidence of thrombus. Normal compressibility, respiratory phasicity and response to augmentation. Cephalic Vein: There is mixed echogenic occlusive thrombus involving the right cephalic vein extending from the level of the distal forearm (image 29), through the antecubital fossa (image 30), to the proximal humerus (image 31). Note is made of echogenic calcifications involving the thrombus  within the mid aspect of the cephalic vein (image 32). Basilic Vein: No evidence of thrombus. Normal compressibility, respiratory phasicity and response to augmentation. Brachial Veins: No evidence of thrombus. Normal compressibility, respiratory phasicity and response to augmentation. Radial Veins: No evidence of thrombus. Normal compressibility, respiratory phasicity and response to augmentation. Ulnar Veins: No evidence of thrombus. Normal compressibility, respiratory phasicity and response to augmentation. Other Findings:  None visualized. IMPRESSION: 1. The examination is positive for age-indeterminate occlusive DVT within the right internal jugular vein. Given history of end-stage renal disease, this could represent the sequela of previous dialysis catheter placement. Clinical correlation is advised. 2. The examination is positive for occlusive SVT involving the right cephalic vein, age indeterminate though given suspected calcifications, is favored to be chronic in etiology. Electronically Signed   By: Sandi Mariscal M.D.   On: 12/10/2019 14:29     Echo moderate reduced left ventricular function between 30 to 35%  TELEMETRY: Sinus rhythm nonspecific ST-T wave changes bradycardic:  ASSESSMENT AND PLAN:  Active Problems:   Cardiac arrest [facility]) Atrial fibrillation History of V. fib arrest Cardiomyopathy End-stage renal disease on dialysis Permanent tracheostomy History of head neck cancer Generalized weakness and fatigue COPD Congestive heart failure Obstructive sleep apnea  Plan Discontinue IV amiodarone Switch to p.o. amiodarone 100 mg once a day pills can be crushed Bradycardia appears to be asymptomatic at this point so we will reduce medication Continue dialysis management control Continue inhalers as necessary for COPD Cardiomyopathy congestive heart failure therapy Recommend starting Entresto twice a day Recommend outpatient evaluation for possible AICD placement   Yolonda Kida, MD 12/10/2019 11:32 PM

## 2019-12-10 NOTE — Progress Notes (Signed)
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   Apparently patient lost pulse while having his the most recent dialysis session. Patient had also developed A. fib RVR and was apneic in the field and required bag mask ventilation. After arrival to the ER patient again coded x2 with V. fib arrest status post ACLS and ROSC, found to be amiodarone and 20 MCG [place] bedside. He underwent ACLS x 3. He was placed on the arctic sun protocol.  The patient required mechanical ventilation via tracheostomy. He was placed on an amiodarone gtt. Triple lumen catheter was placed. TTE was performed and demonstrated EF 25-30%. 26% LVEF by PLAX. Grade 1 diastolic dysfunction. RV is mildly enlarged.   The patient remains on low dose amiodarone gtt. Pressors off this morning per nursing. Blood pressure remains low normal, but stable since patient left the ICU.  Consultants  . Cardiology . Nephrology . PCCM  Procedures  . Dialysis . Mechanical ventilation  Antibiotics   Anti-infectives (From admission, onward)   Start     Dose/Rate Route Frequency Ordered Stop   11/28/19 1545  ceFEPIme (MAXIPIME) 2 g in sodium chloride 0.9 % 100 mL IVPB        2 g 200 mL/hr over 30 Minutes Intravenous  Once 11/28/19 1536 11/28/19 1646   11/28/19 1545  vancomycin (VANCOCIN) IVPB 1000 mg/200 mL premix        1,000 mg 200 mL/hr over 60 Minutes Intravenous   Once 11/28/19 1536 11/28/19 1722     Subjective  The patient is resting comfortably. The patient is anxious to be getting up to a chair.   Objective   Vitals:  Vitals:   12/10/19 1545 12/10/19 1548  BP: (!) 56/45 (!) 93/48  Pulse:  61  Resp: (!) 23 (!) 26  Temp:    SpO2:     Exam:  Constitutional:  . The patient is resting comfortably. No acute distress. Respiratory:  . No increased work of breathing. . No wheezes, rales, or rhonchi . No tactile fremitus Cardiovascular:  . Regular rate and rhythm . No murmurs, ectopy, or gallups. . No lateral PMI. No thrills. Abdomen:  . Abdomen is soft, non-tender, non-distended . No hernias, masses, or organomegaly . Normoactive bowel sounds.  Musculoskeletal:  . No cyanosis, clubbing, or edema Skin:  . No rashes, lesions, ulcers . palpation of skin: no induration or nodules Neurologic:  . CN 2-12 intact . Sensation all 4 extremities intact Psychiatric:  . Mental status o Mood, affect appropriate o Orientation to person, place, time  . judgment and insight appear intact  I have personally reviewed the following:   Today's Data  . Vitals, CBC, BMP  Micro Data  . Blood cultures x 2: No growth  Scheduled Meds: . sodium chloride   Intravenous Once  . amiodarone  100 mg Oral Daily  . chlorhexidine gluconate (MEDLINE KIT)  15 mL Mouth Rinse BID  . Chlorhexidine Gluconate Cloth  6 each Topical Q0600  . [START ON 12/12/2019] epoetin (EPOGEN/PROCRIT) injection  2,000 Units Intravenous Q T,Th,Sa-HD  . feeding supplement (NEPRO CARB STEADY)  237 mL Oral BID BM  . heparin  4,500 Units Intravenous Once  . insulin aspart  0-5 Units Subcutaneous QHS  . insulin aspart  0-6 Units Subcutaneous TID WC  . midodrine  10 mg Oral TID WC  . multivitamin  1 tablet Oral QHS  . sodium chloride flush  10-40 mL Intracatheter Q12H   Continuous Infusions: . sodium chloride Stopped (12/06/19 1834)  . sodium chloride Stopped (11/28/19 2021)  .  sodium chloride    . sodium chloride    . famotidine (PEPCID) IV 20 mg (12/10/19 0910)  . heparin      Active Problems:   Cardiac arrest ([place])   LOS: 12 days   A & P  Severe ACUTE Hypoxic and Hypercapnic Respiratory Failure: Pt has been taken off of the vent. He is now saturating 94% on 5L. He is receiving nebulizer treatments  Acute Systolic Cardiac Failure: EF 20%. Complicated by Atrial fibrillation with RVR and ischemic cardiomyopathy.  SYSTOLIC CARDIAC FAILURE- EF 20%. The patient continues to require IV amiodarone as he is still unable to take PO. No anticoagulation due to bleeding history and anemia. Cardiology does not feel that arrest was a primary ischemic event. Echo did not reveal changes in wall motion or EF.   Hypotension: Pt is off of pressors. Yesterday the patient's blood pressures were occasionally low. They have been stable overnight. Will transfer to progressive care.  ESRD on HD: As per nephrology. Continue HD. Medications will be renally dosed.  Cardiogenic Shock: CPatient is off of pressors. Blood pressures are currently stable. Continue to monitor closely.  Laryngeal Carcinoma: Stable. Chronic tracheostomy  Altered Mental Status: Improving.  I have seen and examined this patient myself. I have spent 34 minutes in his evaluation and care.  DVT Prophylaxis: SCD's CODE STATUS: Full Code Family Communication: Wife at bedside Disposition: From Home. Anticipate discharge to SNF/Rehab.  Status is: Inpatient  Remains inpatient appropriate because:Inpatient level of care appropriate due to severity of illness  Dispo: The patient is from: Home              Anticipated d/c is to: To be determined.              Anticipated d/c date is: > 3 days              Patient currently is not medically stable to d/c.  [name], DO [facility] Direct contact: see [email or web address]  7PM-7AM contact night coverage as above 12/09/2019, 6:54 PM  LOS: 7 days

## 2019-12-10 NOTE — Consult Note (Signed)
ANTICOAGULATION CONSULT NOTE - Initial Consult  Pharmacy Consult for Heparin Indication: DVT  Allergies  Allergen Reactions  . Ace Inhibitors Other (See Comments)    Potassium elevated  . Gabapentin Other (See Comments)    Ataxia at 100 mg.  . Losartan Other (See Comments)    Hyperkalemia    Patient Measurements: Height: 5' 6" (167.6 cm) Weight: 74 kg (163 lb 1.6 oz) IBW/kg (Calculated) : 63.8 Heparin Dosing Weight: 74 kg  Vital Signs: Temp: 98.4 F (36.9 C) (07/06 1520) Temp Source: Oral (07/06 1520) BP: 93/48 (07/06 1548) Pulse Rate: 61 (07/06 1548)  Labs: Recent Labs    12/08/19 0725 12/08/19 0725 12/09/19 0817 12/09/19 0817 12/09/19 1112 12/09/19 1113 12/10/19 0606  HGB 9.8*   < > TEST WILL BE CREDITED   < >  --  10.3* 10.8*  HCT RESULTS UNAVAILABLE DUE TO INTERFERING SUBSTANCE   < > TEST WILL BE CREDITED  --   --  27.7* 28.9*  PLT 201   < > TEST WILL BE CREDITED  --   --  215 192  CREATININE 7.07*  --   --   --  7.60*  --  8.61*   < > = values in this interval not displayed.    Estimated Creatinine Clearance: 5.8 mL/min (A) (by C-G formula based on SCr of 8.61 mg/dL (H)).   Medical History: Past Medical History:  Diagnosis Date  . Cancer ([place])   . CHF (congestive heart failure) ([place])   . COPD (chronic obstructive pulmonary disease) ([place])   . Diabetes mellitus without complication ([place])   . Hypertension   . Renal disorder     Medications:  Medications Prior to Admission  Medication Sig Dispense Refill Last Dose  . amLODipine (NORVASC) 2.5 MG tablet Take 2.5 mg by mouth daily.    11/27/2019 at Unknown time  . aspirin 81 MG chewable tablet Chew 81 mg by mouth daily.    11/27/2019 at Unknown time  . atorvastatin (LIPITOR) 80 MG tablet Take 80 mg by mouth daily.    11/27/2019 at Unknown time  . B Complex-C-Folic Acid (TRIPHROCAPS) 1 MG CAPS Take 1 capsule by mouth daily.   11/27/2019 at Unknown time  . Cholecalciferol (VITAMIN D3) 1000 units CAPS Take  1,000 Units by mouth daily.    11/27/2019 at Unknown time  . clopidogrel (PLAVIX) 75 MG tablet Take 75 mg by mouth daily.    11/27/2019 at Unknown time  . ferrous sulfate 325 (65 FE) MG EC tablet Take 325 mg by mouth daily with breakfast.   11/27/2019 at Unknown time  . GLIPIZIDE XL 10 MG 24 hr tablet Take 10 mg by mouth daily with breakfast.    11/27/2019 at Unknown time  . hydrALAZINE (APRESOLINE) 25 MG tablet Take 25 mg by mouth 3 (three) times daily.    11/27/2019 at Unknown time  . isosorbide dinitrate (ISORDIL) 20 MG tablet Take 20 mg by mouth 3 (three) times daily.   11/27/2019 at Unknown time  . metoprolol succinate (TOPROL-XL) 25 MG 24 hr tablet Take 25 mg by mouth daily.    11/27/2019 at Unknown time  . mirtazapine (REMERON) 7.5 MG tablet Take 7.5 mg by mouth at bedtime.    11/27/2019 at Unknown time  . pantoprazole (PROTONIX) 40 MG tablet Take 40 mg by mouth daily.   11/27/2019 at Unknown time  . sevelamer carbonate (RENVELA) 800 MG tablet Take 800-1,600 mg by mouth in the morning, at noon, in the evening,  and at bedtime.   11/27/2019 at Unknown time  . torsemide (DEMADEX) 20 MG tablet Take 20 mg by mouth daily.    11/27/2019 at Unknown time  . glucagon (GLUCAGON EMERGENCY) 1 MG injection as needed. Reported on 06/02/2015   unknown at prn  . glucose blood (PRECISION QID TEST) test strip Accu check Aviva strips, use twice daily     . nitroGLYCERIN (NITROSTAT) 0.3 MG SL tablet Place 0.3 mg under the tongue every 5 (five) minutes as needed for chest pain.    unknown at prn   Scheduled:  . sodium chloride   Intravenous Once  . amiodarone  100 mg Oral Daily  . chlorhexidine gluconate (MEDLINE KIT)  15 mL Mouth Rinse BID  . Chlorhexidine Gluconate Cloth  6 each Topical Q0600  . [START ON 12/12/2019] epoetin (EPOGEN/PROCRIT) injection  2,000 Units Intravenous Q T,Th,Sa-HD  . feeding supplement (NEPRO CARB STEADY)  237 mL Oral BID BM  . insulin aspart  0-5 Units Subcutaneous QHS  . insulin aspart  0-6  Units Subcutaneous TID WC  . midodrine  10 mg Oral TID WC  . multivitamin  1 tablet Oral QHS  . sodium chloride flush  10-40 mL Intracatheter Q12H   Infusions:  . sodium chloride Stopped (12/06/19 1834)  . sodium chloride Stopped (11/28/19 2021)  . sodium chloride    . sodium chloride    . famotidine (PEPCID) IV 20 mg (12/10/19 0910)   PRN: sodium chloride, sodium chloride, alteplase, bisacodyl, heparin, HYDROmorphone (DILAUDID) injection, lidocaine (PF), lidocaine-prilocaine, ondansetron (ZOFRAN) IV, pentafluoroprop-tetrafluoroeth, polyethylene glycol, sodium chloride flush Anti-infectives (From admission, onward)   Start     Dose/Rate Route Frequency Ordered Stop   11/28/19 1545  ceFEPIme (MAXIPIME) 2 g in sodium chloride 0.9 % 100 mL IVPB        2 g 200 mL/hr over 30 Minutes Intravenous  Once 11/28/19 1536 11/28/19 1646   11/28/19 1545  vancomycin (VANCOCIN) IVPB 1000 mg/200 mL premix        1,000 mg 200 mL/hr over 60 Minutes Intravenous  Once 11/28/19 1536 11/28/19 1722      Assessment: Pharmacy consulted to start heparin for DVT. No DOAC noted PTA.   Goal of Therapy:  Heparin level 0.3-0.7 units/ml Monitor platelets by anticoagulation protocol: Yes   Plan:  Give 4500 units bolus x 1 Start heparin infusion at 1150 units/hr Check anti-Xa level in 8 hours and daily while on heparin Continue to monitor H&H and platelets  Oswald Hillock, PharmD, BCPS 12/10/2019,3:51 PM

## 2019-12-10 NOTE — Care Management Important Message (Signed)
Important Message  Patient Details  Name: Kerry [name] MRN: [id number] Date of Birth: 31-Jan-1934   Medicare Important Message Given:  Yes     Dannette Barbara 12/10/2019, 2:50 PM

## 2019-12-10 NOTE — Progress Notes (Signed)
Patient seen earlier for airway assessment. Patient back from HD.  On 28% TC tolerating well. Laryngectomy tube in place. Wife to clean because requires a special brush which is different than what's in normal trach cleaning kit.  Patient has speaking apparatus so cleaning is different from normal trach cleaning. Patient eating when I left in no distress.

## 2019-12-10 NOTE — Progress Notes (Signed)
This note also relates to the following rows which could not be included: Pulse Rate - Cannot attach notes to unvalidated device data Resp - Cannot attach notes to unvalidated device data  Hd started  

## 2019-12-10 NOTE — Progress Notes (Signed)
PT Cancellation Note  Patient Details Name: Kerry [name] MRN: [id number] DOB: 03-19-1934   Cancelled Treatment:    Reason Eval/Treat Not Completed: Other (comment). Pt currently pending imaging for DVT. Will hold at this time. Also plans for HD this date. Will re-attempt next available date.   Kerry [name] 12/10/2019, 1:37 PM Greggory Stallion, PT, DPT [phone]

## 2019-12-10 NOTE — Progress Notes (Signed)
[facility]  ROUNDING NOTE   Subjective:   Doing fair. Denies any acute complaints Blood pressure low normal    Objective:  Vital signs in last 24 hours:  Temp:  [97.5 F (36.4 C)-98.5 F (36.9 C)] 98.5 F (36.9 C) (07/06 1123) Pulse Rate:  [53-66] 64 (07/06 1143) Resp:  [16-26] 17 (07/06 1123) BP: (76-126)/(39-95) 126/61 (07/06 1315) SpO2:  [82 %-100 %] 82 % (07/06 1143) FiO2 (%):  [28 %] 28 % (07/06 1123) Weight:  [74 kg] 74 kg (07/06 0357)  Weight change: 1.232 kg Filed Weights   12/08/19 0359 12/09/19 0446 12/10/19 0357  Weight: 72.8 kg 72.7 kg 74 kg    Intake/Output: I/O last 3 completed shifts: In: 346.4 [I.V.:346.4] Out: 2000 [Other:2000]   Intake/Output this shift:  No intake/output data recorded.  Physical Exam: General:  No acute distress, laying in the bed  HEENT  anicteric, moist oral mucous membrane, status post laryngectomy,   Pulm/lungs  normal breathing effort, bilateral mild crackles, tracheostomy  CVS/Heart  no rub or gallop, irregular rhythm, atrial fibrillation  Abdomen:   Soft, nontender  Extremities:  + Dependent peripheral edema in thighs and arms  Neurologic:  Alert, able to follow commands  Skin:  No acute rashes   Access: Left arm AVG    Basic Metabolic Panel: Recent Labs  Lab 12/06/19 0441 12/06/19 0441 12/07/19 0526 12/07/19 0526 12/08/19 0725 12/09/19 1112 12/10/19 0606  NA 139  --  137  --  136 134* 135  K 3.0*  --  3.5  --  3.7 3.7 3.7  CL 93*  --  92*  --  92* 92* 90*  CO2 30  --  30  --  _0 GLUCOSE 168*  --  258*  --  180* 226* 156*  BUN 42*  --  48*  --  61* 68* 77*  CREATININE 5.23*  --  6.26*  --  7.07* 7.60* 8.61*  CALCIUM 8.0*   < > 8.3*   < > 8.5* 8.1* 8.1*  MG 1.9  --  1.9  --  2.1 2.2 2.1  PHOS 3.8  --  4.1  --  5.1* 5.7* 6.4*   < > = values in this interval not displayed.    Liver Function Tests: No results for input(s): AST, ALT, ALKPHOS, BILITOT, PROT, ALBUMIN in the last 168  hours. No results for input(s): LIPASE, AMYLASE in the last 168 hours. No results for input(s): AMMONIA in the last 168 hours.  CBC: Recent Labs  Lab 12/04/19 0536 12/04/19 0536 12/05/19 0507 12/05/19 0507 12/06/19 0441 12/06/19 0441 12/07/19 0526 12/07/19 0526 12/07/19 0849 12/08/19 0725 12/09/19 0817 12/09/19 1113 12/10/19 0606  WBC 12.2*   < > 13.6*   < > 12.7*   < > 11.4*   < > 11.1* 10.6* RESULTS ENTERED IN ERROR 10.7* 11.3*  NEUTROABS 10.3*  --  11.3*  --  10.7*  --  9.6*  --   --   --   --   --   --   HGB 8.9*   < > 10.2*   < > 10.1*   < > 9.4*   < > 9.6* 9.8* TEST WILL BE CREDITED 10.3* 10.8*  HCT 24.5*   < > 27.5*   < > 27.8*   < > 25.9*   < > 26.7* RESULTS UNAVAILABLE DUE TO INTERFERING SUBSTANCE TEST WILL BE CREDITED 27.7* 28.9*  MCV 88.1   < > 85.7   < >  89.4   < > 88.7   < > 89.6 RESULTS UNAVAILABLE DUE TO INTERFERING SUBSTANCE TEST WILL BE CREDITED 87.7 87.0  PLT 173   < > 210   < > 224   < > 221   < > 209 201 TEST WILL BE CREDITED 215 192   < > = values in this interval not displayed.    Cardiac Enzymes: No results for input(s): CKTOTAL, CKMB, CKMBINDEX, TROPONINI in the last 168 hours.  BNP: Invalid input(s): POCBNP  CBG: Recent Labs  Lab 12/09/19 0847 12/09/19 1539 12/09/19 2112 12/10/19 0728 12/10/19 1120  GLUCAP 210* 194* 161* 157* 173*    Microbiology: Results for orders placed or performed during the hospital encounter of 11/28/19  Culture, blood (routine x 2)     Status: None   Collection Time: 11/28/19  1:31 PM   Specimen: BLOOD  Result Value Ref Range Status   Specimen Description BLOOD BLOOD RIGHT ARM  Final   Special Requests   Final    BOTTLES DRAWN AEROBIC AND ANAEROBIC Blood Culture adequate volume   Culture   Final    NO GROWTH 5 DAYS Performed at [facility], [address] [place], [place] [address]    Report Status 12/03/2019 FINAL  Final  Culture, blood (routine x 2)     Status: None   Collection Time: 11/28/19   2:17 PM   Specimen: BLOOD  Result Value Ref Range Status   Specimen Description BLOOD BLOOD RIGHT WRIST  Final   Special Requests   Final    BOTTLES DRAWN AEROBIC AND ANAEROBIC Blood Culture adequate volume   Culture   Final    NO GROWTH 5 DAYS Performed at [facility], [address] [place], [place] [address]    Report Status 12/03/2019 FINAL  Final  SARS Coronavirus 2 by RT PCR (hospital order, performed in [place] hospital lab) Nasopharyngeal Nasopharyngeal Swab     Status: None   Collection Time: 11/28/19  4:40 PM   Specimen: Nasopharyngeal Swab  Result Value Ref Range Status   SARS Coronavirus 2 NEGATIVE NEGATIVE Final    Comment: (NOTE) SARS-CoV-2 target nucleic acids are NOT DETECTED.  The SARS-CoV-2 RNA is generally detectable in upper and lower respiratory specimens during the acute phase of infection. The lowest concentration of SARS-CoV-2 viral copies this assay can detect is 250 copies / mL. A negative result does not preclude SARS-CoV-2 infection and should not be used as the sole basis for treatment or other patient management decisions.  A negative result may occur with improper specimen collection / handling, submission of specimen other than nasopharyngeal swab, presence of viral mutation(s) within the areas targeted by this assay, and inadequate number of viral copies (<250 copies / mL). A negative result must be combined with clinical observations, patient history, and epidemiological information.  Fact Sheet for Patients:   StrictlyIdeas.no  Fact Sheet for Healthcare Providers: BankingDealers.co.za  This test is not yet approved or  cleared by the [place] FDA and has been authorized for detection and/or diagnosis of SARS-CoV-2 by FDA under an Emergency Use Authorization (EUA).  This EUA will remain in effect (meaning this test can be used) for the duration of the COVID-19 declaration  under Section 564(b)(1) of the Act, 21 U.S.C. section 360bbb-3(b)(1), unless the authorization is terminated or revoked sooner.  Performed at [facility], [address], [place], [place] [address]   MRSA PCR Screening     Status: None  Collection Time: 11/28/19  9:08 PM   Specimen: Nasopharyngeal  Result Value Ref Range Status   MRSA by PCR NEGATIVE NEGATIVE Final    Comment:        The GeneXpert MRSA Assay (FDA approved for NASAL specimens only), is one component of a comprehensive MRSA colonization surveillance program. It is not intended to diagnose MRSA infection nor to guide or monitor treatment for MRSA infections. Performed at [facility], [address] [place], [place] [address]     Coagulation Studies: No results for input(s): LABPROT, INR in the last 72 hours.  Urinalysis: No results for input(s): COLORURINE, LABSPEC, PHURINE, GLUCOSEU, HGBUR, BILIRUBINUR, KETONESUR, PROTEINUR, UROBILINOGEN, NITRITE, LEUKOCYTESUR in the last 72 hours.  Invalid input(s): APPERANCEUR    Imaging: No results found.   Medications:   . sodium chloride Stopped (12/06/19 1834)  . sodium chloride Stopped (11/28/19 2021)  . sodium chloride    . sodium chloride    . amiodarone 30 mg/hr (12/10/19 0139)  . famotidine (PEPCID) IV 20 mg (12/10/19 0910)   . sodium chloride   Intravenous Once  . chlorhexidine gluconate (MEDLINE KIT)  15 mL Mouth Rinse BID  . Chlorhexidine Gluconate Cloth  6 each Topical Q0600  . [START ON 12/12/2019] epoetin (EPOGEN/PROCRIT) injection  2,000 Units Intravenous Q T,Th,Sa-HD  . feeding supplement (NEPRO CARB STEADY)  237 mL Oral BID BM  . insulin aspart  0-5 Units Subcutaneous QHS  . insulin aspart  0-6 Units Subcutaneous TID WC  . midodrine  10 mg Oral TID WC  . multivitamin  1 tablet Oral QHS  . sodium chloride flush  10-40 mL Intracatheter Q12H   sodium chloride, sodium chloride, alteplase, bisacodyl, heparin, HYDROmorphone  (DILAUDID) injection, lidocaine (PF), lidocaine-prilocaine, ondansetron (ZOFRAN) IV, pentafluoroprop-tetrafluoroeth, polyethylene glycol, sodium chloride flush  Assessment/ Plan:  [name] is a 86 y.o. 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 End-stage renal disease with volume overload:   2 L was able to be removed with HD on Saturday and another 2 L on Monday Midodrine and IV albumin supplementation for hypotension Plan for routine hemodialysis later today if hemodynamically stable  2. Hypokalemia:  Potassium in normal range today  3.  Atrial fibrillation  Management as per cardiology Rate control with amiodarone. Currently given IV  4. Anemia with chronic kidney disease: receives Mircera as outpatient.  PRBC transfusion 6/29.  - EPO with scheduled HD treatment.  Lab Results  Component Value Date   HGB 10.8 (L) 12/10/2019      LOS: 12 Kerry [name] 7/6/20211:21 PM

## 2019-12-10 NOTE — Progress Notes (Signed)
   12/10/19 1123  Assess: MEWS Score  Temp 98.5 F (36.9 C)  BP (!) 76/47  Pulse Rate 62  Resp 17  SpO2 95 %  O2 Device Tracheostomy Collar  O2 Flow Rate (L/min) 5 L/min  FiO2 (%) 28 %  Assess: MEWS Score  MEWS Temp 0  MEWS Systolic 2  MEWS Pulse 0  MEWS RR 0  MEWS LOC 0  MEWS Score 2  MEWS Score Color Yellow  Assess: if the MEWS score is Yellow or Red  Were vital signs taken at a resting state? Yes  Focused Assessment Documented focused assessment  Early Detection of Sepsis Score *See Row Information* Low  MEWS guidelines implemented *See Row Information* Yes  Treat  MEWS Interventions Administered scheduled meds/treatments  Take Vital Signs  Increase Vital Sign Frequency  Yellow: Q 2hr X 2 then Q 4hr X 2, if remains yellow, continue Q 4hrs  Escalate  MEWS: Escalate Yellow: discuss with charge nurse/RN and consider discussing with provider and RRT  Notify: Charge Nurse/RN  Name of Charge Nurse/RN Notified Gretta Began, RN  Date Charge Nurse/RN Notified 12/10/19  Time Charge Nurse/RN Notified 1133  Notify: Provider  Provider Name/Title Dr. Lesia Hausen. Clayborn Bigness  Date Provider Notified 12/10/19  Time Provider Notified 1135  Notification Type Page  Notification Reason Other (Comment) (low BP)  Response See new orders  Date of Provider Response 12/10/19  Time of Provider Response 1233  Document  Patient Outcome Stabilized after interventions  Progress note created (see row info) Yes

## 2019-12-11 DIAGNOSIS — N179 Acute kidney failure, unspecified: Secondary | ICD-10-CM

## 2019-12-11 LAB — GLUCOSE, CAPILLARY
Glucose-Capillary: 116 mg/dL — ABNORMAL HIGH (ref 70–99)
Glucose-Capillary: 136 mg/dL — ABNORMAL HIGH (ref 70–99)
Glucose-Capillary: 158 mg/dL — ABNORMAL HIGH (ref 70–99)
Glucose-Capillary: 176 mg/dL — ABNORMAL HIGH (ref 70–99)
Glucose-Capillary: 181 mg/dL — ABNORMAL HIGH (ref 70–99)

## 2019-12-11 LAB — MAGNESIUM: Magnesium: 2 mg/dL (ref 1.7–2.4)

## 2019-12-11 LAB — HEPARIN LEVEL (UNFRACTIONATED): Heparin Unfractionated: 0.34 IU/mL (ref 0.30–0.70)

## 2019-12-11 LAB — PHOSPHORUS: Phosphorus: 4.3 mg/dL (ref 2.5–4.6)

## 2019-12-11 MED ORDER — APIXABAN 5 MG PO TABS
10.0000 mg | ORAL_TABLET | Freq: Two times a day (BID) | ORAL | Status: AC
  Administered 2019-12-11 – 2019-12-17 (×14): 10 mg via ORAL
  Filled 2019-12-11 (×14): qty 2

## 2019-12-11 MED ORDER — APIXABAN 5 MG PO TABS: 5.0000 mg | ORAL_TABLET | Freq: Two times a day (BID) | ORAL | Status: DC

## 2019-12-11 NOTE — Progress Notes (Signed)
[facility] Cardiology    SUBJECTIVE: Resting comfortably no significant pain no significant shortness of breath tolerating dialysis well heart rate reasonably controlled   Vitals:   12/11/19 0220 12/11/19 0353 12/11/19 0539 12/11/19 0800  BP:    (!) 103/32  Pulse: 66   64  Resp: (!) 28   20  Temp:    98.1 F (36.7 C)  TempSrc:    Oral  SpO2: 95%   95%  Weight:  72.5 kg 74.5 kg   Height:         Intake/Output Summary (Last 24 hours) at 12/11/2019 1151 Last data filed at 12/11/2019 0973 Gross per 24 hour  Intake 339.83 ml  Output 1000 ml  Net -660.17 ml      PHYSICAL EXAM  General: Well developed, well nourished, in no acute distress HEENT:  Normocephalic and atramatic chronic tracheotomy Neck:  No JVD.  Lungs: Clear bilaterally to auscultation and percussion. Heart: HRRR . Normal S1 and S2 without gallops or murmurs.  Abdomen: Bowel sounds are positive, abdomen soft and non-tender  Msk:  Back normal, normal gait. Normal strength and tone for age. Extremities: No clubbing, cyanosis or edema.   Neuro: Alert and oriented X 3. Psych:  Good affect, responds appropriately   LABS: Basic Metabolic Panel: Recent Labs    12/09/19 1112 12/09/19 1112 12/10/19 0606 12/11/19 0532  NA 134*  --  135  --   K 3.7  --  3.7  --   CL 92*  --  90*  --   CO2 25  --  24  --   GLUCOSE 226*  --  156*  --   BUN 68*  --  77*  --   CREATININE 7.60*  --  8.61*  --   CALCIUM 8.1*  --  8.1*  --   MG 2.2   < > 2.1 2.0  PHOS 5.7*   < > 6.4* 4.3   < > = values in this interval not displayed.   Liver Function Tests: No results for input(s): AST, ALT, ALKPHOS, BILITOT, PROT, ALBUMIN in the last 72 hours. No results for input(s): LIPASE, AMYLASE in the last 72 hours. CBC: Recent Labs    12/09/19 1113 12/10/19 0606  WBC 10.7* 11.3*  HGB 10.3* 10.8*  HCT 27.7* 28.9*  MCV 87.7 87.0  PLT 215 192   Cardiac Enzymes: No results for input(s): CKTOTAL, CKMB, CKMBINDEX, TROPONINI in the last 72  hours. BNP: Invalid input(s): POCBNP D-Dimer: No results for input(s): DDIMER in the last 72 hours. Hemoglobin A1C: No results for input(s): HGBA1C in the last 72 hours. Fasting Lipid Panel: No results for input(s): CHOL, HDL, LDLCALC, TRIG, CHOLHDL, LDLDIRECT in the last 72 hours. Thyroid Function Tests: No results for input(s): TSH, T4TOTAL, T3FREE, THYROIDAB in the last 72 hours.  Invalid input(s): FREET3 Anemia Panel: No results for input(s): VITAMINB12, FOLATE, FERRITIN, TIBC, IRON, RETICCTPCT in the last 72 hours.  US Venous Img Upper Uni Right(DVT)  Result Date: 12/10/2019 CLINICAL DATA:  Right upper extremity pain and. History of end-stage renal disease. Former smoker. Evaluate for DVT. EXAM: RIGHT UPPER EXTREMITY VENOUS DOPPLER ULTRASOUND TECHNIQUE: Gray-scale sonography with graded compression, as well as color Doppler and duplex ultrasound were performed to evaluate the upper extremity deep venous system from the level of the subclavian vein and including the jugular, axillary, basilic, radial, ulnar and upper cephalic vein. Spectral Doppler was utilized to evaluate flow at rest and with distal augmentation maneuvers. COMPARISON:  Chest  radiograph-01/01/2020 FINDINGS: Contralateral Subclavian Vein: Respiratory phasicity is normal and symmetric with the symptomatic side. No evidence of thrombus. Normal compressibility. Internal Jugular Vein: There is mixed echogenic occlusive thrombus within the right internal jugular vein (images 2 through 5). Subclavian Vein: No evidence of thrombus. Normal compressibility, respiratory phasicity and response to augmentation. Axillary Vein: No evidence of thrombus. Normal compressibility, respiratory phasicity and response to augmentation. Cephalic Vein: There is mixed echogenic occlusive thrombus involving the right cephalic vein extending from the level of the distal forearm (image 29), through the antecubital fossa (image 30), to the proximal humerus  (image 31). Note is made of echogenic calcifications involving the thrombus within the mid aspect of the cephalic vein (image 32). Basilic Vein: No evidence of thrombus. Normal compressibility, respiratory phasicity and response to augmentation. Brachial Veins: No evidence of thrombus. Normal compressibility, respiratory phasicity and response to augmentation. Radial Veins: No evidence of thrombus. Normal compressibility, respiratory phasicity and response to augmentation. Ulnar Veins: No evidence of thrombus. Normal compressibility, respiratory phasicity and response to augmentation. Other Findings:  None visualized. IMPRESSION: 1. The examination is positive for age-indeterminate occlusive DVT within the right internal jugular vein. Given history of end-stage renal disease, this could represent the sequela of previous dialysis catheter placement. Clinical correlation is advised. 2. The examination is positive for occlusive SVT involving the right cephalic vein, age indeterminate though given suspected calcifications, is favored to be chronic in etiology. Electronically Signed   By: Sandi Mariscal M.D.   On: 12/10/2019 14:29     Echo moderate to severely reduced left ventricular function globally 30%  TELEMETRY: Normal sinus rhythm nonspecific ST-T wave changes:  ASSESSMENT AND PLAN:  Active Problems:   Cardiac arrest [facility]) Atrial fibrillation now converted to sinus Episode of ventricular tachycardia End-stage renal disease on hemodialysis Hypotension on midodrine Diabetes type 2 GERD  Plan Continue amiodarone 100 mg once a day for atrial fibrillation Agree with Eliquis for anticoagulation Continued diabetes management and control with insulin Agree with dialysis for end-stage renal disease Continue Protonix therapy for reflux type symptoms Continue midodrine therapy for mild hypotension Consider outpatient evaluation for AICD   Yolonda Kida, MD, 12/11/2019 11:51 AM

## 2019-12-11 NOTE — Progress Notes (Signed)
Nutrition Follow-up  DOCUMENTATION CODES:   Not applicable  INTERVENTION:   Continue Nepro Shake po BID, each supplement provides 425 kcal and 19 grams protein.  Continue Rena-vite QHS.  NUTRITION DIAGNOSIS:   Increased nutrient needs related to catabolic illness (CHF, COPD, ESRD on HD) as evidenced by estimated needs.  GOAL:   Patient will meet greater than or equal to 90% of their needs -Progressing   MONITOR:   PO intake, Supplement acceptance, Labs, Weight trends, I & O's  ASSESSMENT:   86 year old male with history of end-stage renal disease on hemodialysis, history of laryngeal carcinoma status post tracheostomy, hyperlipidemia, sleep apnea, CHF, COPD, HTN, DM who is admitted with cardiac arrest during hemodialysis   Pt with improving appetite and oral intake; pt eating ~50-75% of meals and drinking some Nepro supplements. Per chart, pt with ~32lb weight gain since admit; pt is now ~24lbs from her UBW. Next HD scheduled for tomorrow.   Medications reviewed and include: epoetin, insulin, rena-vite, pepcid  Labs reviewed: K 3.7 wnl, BUN 77(H), creat 8.61(H), P 4.3 wnl, Mg 2.0 wnl Wbc- 11.3(H), Hgb 10.8(L), Hct 28.9(L)  Diet Order:   Diet Order            DIET DYS 2 Room service appropriate? Yes with Assist; Fluid consistency: Thin  Diet effective now                EDUCATION NEEDS:   No education needs have been identified at this time  Skin:  Skin Assessment: Reviewed RN Assessment  Last BM:  7/7- TYPE 7  Height:   Ht Readings from Last 1 Encounters:  11/28/19 5\' 6"  (1.676 m)   Weight:   Wt Readings from Last 1 Encounters:  12/11/19 74.5 kg   Ideal Body Weight:  64.5 kg  BMI:  Body mass index is 26.52 kg/m.  Estimated Nutritional Needs:   Kcal:  1850-2050  Protein:  95-105 grams  Fluid:  UOP + 1 L  Koleen Distance MS, RD, LDN Please refer to [facility] for RD and/or RD on-call/weekend/after hours pager

## 2019-12-11 NOTE — Consult Note (Signed)
[place] for Apixaban Indication: DVT  Allergies  Allergen Reactions   Ace Inhibitors Other (See Comments)    Potassium elevated   Gabapentin Other (See Comments)    Ataxia at 100 mg.   Losartan Other (See Comments)    Hyperkalemia    Patient Measurements: Height: 5' 6"  (167.6 cm) Weight: 74.5 kg (164 lb 4.8 oz) IBW/kg (Calculated) : 63.8  Vital Signs: Temp: 98.1 F (36.7 C) (07/07 0800) Temp Source: Oral (07/07 0800) BP: 103/32 (07/07 0800) Pulse Rate: 64 (07/07 0800)  Labs: Recent Labs    12/09/19 0817 12/09/19 0817 12/09/19 1112 12/09/19 1113 12/10/19 0606 12/10/19 2111 12/11/19 1006  HGB TEST WILL BE CREDITED   < >  --  10.3* 10.8*  --   --   HCT TEST WILL BE CREDITED  --   --  27.7* 28.9*  --   --   PLT TEST WILL BE CREDITED  --   --  215 192  --   --   APTT  --   --   --   --   --  41*  --   LABPROT  --   --   --   --   --  13.7  --   INR  --   --   --   --   --  1.1  --   HEPARINUNFRC  --   --   --   --   --   --  0.34  CREATININE  --   --  7.60*  --  8.61*  --   --    < > = values in this interval not displayed.    Estimated Creatinine Clearance: 5.8 mL/min (A) (by C-G formula based on SCr of 8.61 mg/dL (H)).   Medical History: Past Medical History:  Diagnosis Date   Cancer ([place])    CHF (congestive heart failure) ([facility])    COPD (chronic obstructive pulmonary disease) ([place])    Diabetes mellitus without complication ([place])    Hypertension    Renal disorder     Medications:  Medications Prior to Admission  Medication Sig Dispense Refill Last Dose   amLODipine (NORVASC) 2.5 MG tablet Take 2.5 mg by mouth daily.    11/27/2019 at Unknown time   aspirin 81 MG chewable tablet Chew 81 mg by mouth daily.    11/27/2019 at Unknown time   atorvastatin (LIPITOR) 80 MG tablet Take 80 mg by mouth daily.    11/27/2019 at Unknown time   B Complex-C-Folic Acid (TRIPHROCAPS) 1 MG CAPS Take 1 capsule by mouth daily.    11/27/2019 at Unknown time   Cholecalciferol (VITAMIN D3) 1000 units CAPS Take 1,000 Units by mouth daily.    11/27/2019 at Unknown time   clopidogrel (PLAVIX) 75 MG tablet Take 75 mg by mouth daily.    11/27/2019 at Unknown time   ferrous sulfate 325 (65 FE) MG EC tablet Take 325 mg by mouth daily with breakfast.   11/27/2019 at Unknown time   GLIPIZIDE XL 10 MG 24 hr tablet Take 10 mg by mouth daily with breakfast.    11/27/2019 at Unknown time   hydrALAZINE (APRESOLINE) 25 MG tablet Take 25 mg by mouth 3 (three) times daily.    11/27/2019 at Unknown time   isosorbide dinitrate (ISORDIL) 20 MG tablet Take 20 mg by mouth 3 (three) times daily.   11/27/2019 at Unknown time   metoprolol succinate (TOPROL-XL) 25 MG 24  hr tablet Take 25 mg by mouth daily.    11/27/2019 at Unknown time   mirtazapine (REMERON) 7.5 MG tablet Take 7.5 mg by mouth at bedtime.    11/27/2019 at Unknown time   pantoprazole (PROTONIX) 40 MG tablet Take 40 mg by mouth daily.   11/27/2019 at Unknown time   sevelamer carbonate (RENVELA) 800 MG tablet Take 800-1,600 mg by mouth in the morning, at noon, in the evening, and at bedtime.   11/27/2019 at Unknown time   torsemide (DEMADEX) 20 MG tablet Take 20 mg by mouth daily.    11/27/2019 at Unknown time   glucagon (GLUCAGON EMERGENCY) 1 MG injection as needed. Reported on 06/02/2015   unknown at prn   glucose blood (PRECISION QID TEST) test strip Accu check Aviva strips, use twice daily      nitroGLYCERIN (NITROSTAT) 0.3 MG SL tablet Place 0.3 mg under the tongue every 5 (five) minutes as needed for chest pain.    unknown at prn   Scheduled:   sodium chloride   Intravenous Once   amiodarone  100 mg Oral Daily   apixaban  10 mg Oral BID   Followed by   Derrill Memo ON 12/18/2019] apixaban  5 mg Oral BID   chlorhexidine gluconate (MEDLINE KIT)  15 mL Mouth Rinse BID   Chlorhexidine Gluconate Cloth  6 each Topical Q0600   [START ON 12/12/2019] epoetin (EPOGEN/PROCRIT) injection   2,000 Units Intravenous Q T,Th,Sa-HD   feeding supplement (NEPRO CARB STEADY)  237 mL Oral BID BM   insulin aspart  0-5 Units Subcutaneous QHS   insulin aspart  0-6 Units Subcutaneous TID WC   midodrine  10 mg Oral TID WC   multivitamin  1 tablet Oral QHS   sodium chloride flush  10-40 mL Intracatheter Q12H   Infusions:   sodium chloride Stopped (12/06/19 1834)   sodium chloride Stopped (11/28/19 2021)   sodium chloride     sodium chloride     famotidine (PEPCID) IV 20 mg (12/11/19 0916)   PRN: sodium chloride, sodium chloride, alteplase, bisacodyl, heparin, HYDROmorphone (DILAUDID) injection, lidocaine (PF), lidocaine-prilocaine, ondansetron (ZOFRAN) IV, pentafluoroprop-tetrafluoroeth, polyethylene glycol, sodium chloride flush Anti-infectives (From admission, onward)   Start     Dose/Rate Route Frequency Ordered Stop   11/28/19 1545  ceFEPIme (MAXIPIME) 2 g in sodium chloride 0.9 % 100 mL IVPB        2 g 200 mL/hr over 30 Minutes Intravenous  Once 11/28/19 1536 11/28/19 1646   11/28/19 1545  vancomycin (VANCOCIN) IVPB 1000 mg/200 mL premix        1,000 mg 200 mL/hr over 60 Minutes Intravenous  Once 11/28/19 1536 11/28/19 1722      Assessment: Pharmacy consulted to start apixaban for positive for age-indeterminate occlusive DVT within the right internal jugular vein. Discussed case with hospitalist, Cardiology, and nephrology. All agree to switch heparin to apixaban. Patient has ESRD on HD.   Goal of Therapy:  Monitor platelets by anticoagulation protocol: Yes   Plan:  Apixaban 10 mg BID for 7 days followed by 5 mg BID.   Oswald Hillock, PharmD, BCPS 12/11/2019,12:25 PM

## 2019-12-11 NOTE — NC FL2 (Signed)
[place] LEVEL OF CARE SCREENING TOOL     IDENTIFICATION  Patient Name: Kerry [name] Birthdate: 08-Mar-1934 Sex: male Admission Date (Current Location): 11/28/2019  [place] and [place] Number:  Engineering geologist and Address:  [facility], [address] [place], [place] [address]      Provider Number: [id number]  Attending Physician Name and Address:  Deatra James, MD  Relative Name and Phone Number:  berlie hatchel [phone]    Current Level of Care:   Recommended Level of Care: [place] Prior Approval Number:    Date Approved/Denied:   PASRR Number: [id number]  Discharge Plan: SNF    Current Diagnoses: Patient Active Problem List   Diagnosis Date Noted  . Cardiac arrest ([place]) 11/28/2019  . Diverticulosis 05/25/2017  . Hyperlipidemia 05/25/2017  . Hypertension 05/25/2017  . Type 2 diabetes mellitus ([place]) 05/25/2017  . Anemia of chronic disease 04/17/2017  . History of tracheostomy 03/14/2017  . NSTEMI (non-ST elevated myocardial infarction) ([place]) 03/13/2017  . 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 Low back pain 11/23/2016  . Vitamin D insufficiency 06/28/2016  . CKD (chronic kidney disease) stage 4, GFR 15-29 ml/min (HCC) 04/05/2016  . Coronary artery disease 02/09/2016  . Heart failure with preserved ejection fraction ([place]) 02/09/2016  . 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 History of hyperkalemia 02/12/2015  . 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 Hypopharyngeal mass 11/28/2014  . Vocal cord paralysis 11/28/2014  . Sleep apnea 12/14/2011  . REM behavioral disorder 12/14/2011  . Smoker 07/22/2011    Orientation RESPIRATION BLADDER Height & Weight     Self, Time, Situation, Place  Other (Comment) ([place]) Incontinent Weight: 74.5 kg Height:  5' 6"  (167.6 cm)  BEHAVIORAL SYMPTOMS/MOOD NEUROLOGICAL BOWEL NUTRITION STATUS       Incontinent Diet (Dysphagia 2 diet, thin liquids)  AMBULATORY STATUS COMMUNICATION OF NEEDS Skin   Extensive Assist Non-Verbally                         Personal Care Assistance Level of Assistance  Bathing, Dressing Bathing Assistance: Limited assistance   Dressing Assistance: Limited assistance     Functional Limitations Info  Speech     Speech Info: Impaired    SPECIAL CARE FACTORS FREQUENCY  PT (By licensed PT), OT (By licensed OT), Speech therapy     PT Frequency: 5x a week OT Frequency: 5x a week     Speech Therapy Frequency: 5x a week      Contractures Contractures Info: Not present    Additional Factors Info  Code Status, Allergies (HD) Code Status Info: Full Allergies Info: Gabapentin, Ace inhibitors, losartan           Current Medications (12/11/2019):  This is the current hospital active medication list Current Facility-Administered Medications  Medication Dose Route Frequency Provider Last Rate Last Admin  . 0.9 %  sodium chloride infusion (Manually program via Guardrails IV Fluids)   Intravenous Once Swayze, Ava, DO      . 0.9 %  sodium chloride infusion  250 mL Intravenous Continuous Swayze, Ava, DO   Stopped at 12/06/19 1834  . 0.9 %  sodium chloride infusion   Intravenous Continuous Swayze, Ava, DO   Held at 11/28/19 2021  . 0.9 %  sodium chloride infusion  100 mL Intravenous PRN Swayze, Ava, DO      . 0.9 %  sodium chloride infusion  100 mL Intravenous PRN Swayze, Ava, DO      . alteplase (CATHFLO ACTIVASE) injection 2 mg  2 mg Intracatheter Once PRN Swayze, Ava, DO      . amiodarone (PACERONE) tablet 100 mg  100 mg Oral Daily Callwood, Dwayne D, MD   100 mg at 12/11/19 0907  . apixaban (ELIQUIS) tablet 10 mg  10 mg Oral BID Shahmehdi, Seyed A, MD   10 mg at 12/11/19 1248   Followed by  . [START ON 12/18/2019] apixaban (ELIQUIS) tablet 5 mg  5 mg Oral BID Shahmehdi, Seyed A, MD      . bisacodyl (DULCOLAX) suppository 10 mg  10 mg Rectal Daily  PRN Swayze, Ava, DO      . chlorhexidine gluconate (MEDLINE KIT) (PERIDEX) 0.12 % solution 15 mL  15 mL Mouth Rinse BID Swayze, Ava, DO   15 mL at 12/11/19 0921  . Chlorhexidine Gluconate Cloth 2 % PADS 6 each  6 each Topical Q0600 Swayze, Ava, DO   6 each at 12/10/19 0700  . [START ON 12/12/2019] epoetin alfa (EPOGEN) injection 2,000 Units  2,000 Units Intravenous Q T,Th,Sa-HD Candiss Norse, Harmeet, MD      . famotidine (PEPCID) IVPB 20 mg premix  20 mg Intravenous Q24H Swayze, Ava, DO 100 mL/hr at 12/11/19 0916 20 mg at 12/11/19 0916  . feeding supplement (NEPRO CARB STEADY) liquid 237 mL  237 mL Oral BID BM Swayze, Ava, DO   237 mL at 12/11/19 1240  . heparin injection 1,000 Units  1,000 Units Dialysis PRN Swayze, Ava, DO      . HYDROmorphone (DILAUDID) injection 0.5-1 mg  0.5-1 mg Intravenous Q3H PRN Swayze, Ava, DO   1 mg at 12/08/19 1940  . insulin aspart (novoLOG) injection 0-5 Units  0-5 Units Subcutaneous QHS Swayze, Ava, DO   2 Units at 12/08/19 2140  . insulin aspart (novoLOG) injection 0-6 Units  0-6 Units Subcutaneous TID WC Swayze, Ava, DO   1 Units at 12/11/19 1240  . lidocaine (PF) (XYLOCAINE) 1 % injection 5 mL  5 mL Intradermal PRN Swayze, Ava, DO      . lidocaine-prilocaine (EMLA) cream 1 application  1 application Topical PRN Swayze, Ava, DO      . midodrine (PROAMATINE) tablet 10 mg  10 mg Oral TID WC Swayze, Ava, DO   10 mg at 12/11/19 1240  . multivitamin (RENA-VIT) tablet 1 tablet  1 tablet Oral QHS Swayze, Ava, DO   1 tablet at 12/09/19 2212  . ondansetron (ZOFRAN) injection 4 mg  4 mg Intravenous Q6H PRN Swayze, Ava, DO   4 mg at 12/11/19 1219  . pentafluoroprop-tetrafluoroeth (GEBAUERS) aerosol 1 application  1 application Topical PRN Swayze, Ava, DO      . polyethylene glycol (MIRALAX / GLYCOLAX) packet 17 g  17 g Per Tube Daily PRN Swayze, Ava, DO   17 g at 12/07/19 1414  . sodium chloride flush (NS) 0.9 % injection 10-40 mL  10-40 mL Intracatheter Q12H Swayze, Ava, DO   10 mL at  12/11/19 1032  . sodium chloride flush (NS) 0.9 % injection 10-40 mL  10-40 mL Intracatheter PRN Swayze, Ava, DO         Discharge Medications: Please see discharge summary for a list of discharge medications.  Relevant Imaging Results:  Relevant Lab Results:   Additional Information [id number], Hemodialysis, [place], [place], Sat  Victorino Dike, [place]

## 2019-12-11 NOTE — Progress Notes (Signed)
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   Apparently patient lost pulse while having his the most recent dialysis session. Patient had also developed A. fib RVR and was apneic in the field and required bag mask ventilation. After arrival to the ER patient again coded x2 with V. fib arrest status post ACLS and ROSC, found to be amiodarone and 20 MCG Levophed.[place] bedside. He underwent ACLS x 3. He was placed on the arctic sun protocol.  The patient required mechanical ventilation via tracheostomy. He was placed on an amiodarone gtt. Triple lumen catheter was placed. TTE was performed and demonstrated EF 25-30%. 26% LVEF by PLAX. Grade 1 diastolic dysfunction. RV is mildly enlarged.   The patient remains on low dose amiodarone gtt. Pressors off this morning per nursing. Blood pressure have been stable since yesterday afternoon.   Subjective: The patient was seen and examined this morning, wife present at bedside.  On 5 L of oxygen, via tracheostomy .   Awake alert following command, right arm swelling .  Per wife no change from yesterday  active Problems: No acute distress, currently stable    Cardiac arrest ([place]) Acute on chronic respiratory failure History of laryngeal carcinoma History of laryngectomy     LOS: 13 days   A & P   Severe ACUTE Hypoxic and Hypercapnic Respiratory Failure:  Patient is currently stable,  chronic tracheostomy,  currently on 5 L of oxygen via trach site neck Satting 95% With a history of laryngectomy   Laryngeal Carcinoma:  Stable. Chronic tracheostomy -PCCM, RT following, continue as needed nebs   Acute Systolic Cardiac Failure:  Remained stable on 5 L of oxygen, satting greater than 92% EF 20%. Complicated by Atrial fibrillation with RVR and ischemic cardiomyopathy.  SYSTOLIC CARDIAC FAILURE- EF 20%. The patient continues to require IV amiodarone as he is still unable to take PO. No anticoagulation due to bleeding history and anemia. Cardiology does not feel that arrest was a primary ischemic event. Echo did not reveal changes in wall motion or EF.   Hypotension:  -BP stabilized improved Pt is off of pressors.   ESRD on HD:  -Nephrology following closely -Hemodialysis per nephrology -Medications are dosed renal   Cardiogenic Shock:  -Status post ICD management, patient has been stabilized off pressors -BP stabilized We will continue to monitor closely   Altered Mental Status:  Improved, at baseline now  Right arm edema, -Likely venous stasis -Ultrasound negative for DVT  Right IJ DVT, right cephalic vein SVT -Patient has been on heparin, will switch to Eliquis today   DVT Prophylaxis: SCD's CODE STATUS: Full Code Family Communication: Wife at bedside Disposition: From Home. Anticipate discharge to SNF/Rehab.  Status is: Inpatient  Remains inpatient appropriate because:Inpatient level of care appropriate due to severity of illness  Dispo: The patient is from: Home              Anticipated d/c is to: SNF, palliative consulted  Anticipated d/c date is: > 3 days              Patient currently is not medically stable to d/c.   Venous ultrasound  IMPRESSION: 1. The examination is positive for age-indeterminate occlusive DVT within the right internal jugular vein. Given history of end-stage renal disease, this could represent the  sequela of previous dialysis catheter placement. Clinical correlation is advised. 2. The examination is positive for occlusive SVT involving the right cephalic vein, age indeterminate though given suspected calcifications, is favored to be chronic in etiology.    Consultants  . Cardiology . Nephrology . PCCM  Procedures  . Dialysis . Mechanical ventilation     Antibiotics   Anti-infectives (From admission, onward)   Start     Dose/Rate Route Frequency Ordered Stop   11/28/19 1545  ceFEPIme (MAXIPIME) 2 g in sodium chloride 0.9 % 100 mL IVPB        2 g 200 mL/hr over 30 Minutes Intravenous  Once 11/28/19 1536 11/28/19 1646   11/28/19 1545  vancomycin (VANCOCIN) IVPB 1000 mg/200 mL premix        1,000 mg 200 mL/hr over 60 Minutes Intravenous  Once 11/28/19 1536 11/28/19 1722     Subjective  The patient is resting comfortably. No new complaints.  Objective   Vitals:  Vitals:   12/11/19 0220 12/11/19 0800  BP:  (!) 103/32  Pulse: 66 64  Resp: (!) 28 20  Temp:  98.1 F (36.7 C)  SpO2: 95% 95%   Exam:   Physical Exam  Constitution:  Alert, cooperative, no distress,   HEENT: Chronic trach.  On 5 L of oxygen  normocephalic, PERRL, otherwise with in Normal limits  Chest:Chest symmetric Cardio vascular:  S1/S2, RRR, No murmure, No Rubs or Gallops  pulmonary: Clear to auscultation bilaterally, respirations unlabored, negative wheezes / crackles Abdomen: Soft, non-tender, non-distended, bowel sounds,no masses, no organomegaly Muscular skeletal: Limited exam - in bed, able to move all 4 extremities, Normal strength,  Neuro: CNII-XII intact. , normal motor and sensation, reflexes intact  Extremities:  Severe generalized weakness, right upper extremity edema  No pitting edema lower extremities, +2 pulses  Skin: Dry, warm to touch, negative for any Rashes, No open wounds Wounds: None visible please see nurses documentation      I have personally reviewed the  following:   Today's Data  . Vitals, CBC, BMP  Micro Data  . Blood cultures x 2: No growth  Scheduled Meds: . sodium chloride   Intravenous Once  . amiodarone  100 mg Oral Daily  . apixaban  10 mg Oral BID   Followed by  . [START ON 12/18/2019] apixaban  5 mg Oral BID  . chlorhexidine gluconate (MEDLINE KIT)  15 mL Mouth Rinse BID  . Chlorhexidine Gluconate Cloth  6 each Topical Q0600  . [START ON 12/12/2019] epoetin (EPOGEN/PROCRIT) injection  2,000 Units Intravenous Q T,Th,Sa-HD  . feeding supplement (NEPRO CARB STEADY)  237 mL Oral BID BM  . insulin aspart  0-5 Units Subcutaneous QHS  . insulin aspart  0-6 Units Subcutaneous TID WC  . midodrine  10 mg Oral TID WC  . multivitamin  1 tablet Oral QHS  . sodium chloride flush  10-40 mL Intracatheter Q12H   Continuous Infusions: . sodium chloride Stopped (12/06/19 1834)  . sodium chloride Stopped (11/28/19 2021)  . sodium chloride    . sodium chloride    . famotidine (PEPCID) IV 20 mg (12/11/19 0916)  SIGNED: Deatra James, MD, FACP, FHM. [facility],  Pager (please use [email or web address] to page/text)  If 7PM-7AM, please contact night-coverage [email or web address] [email or web address] [facility] 12/11/2019, 12:39 PM

## 2019-12-11 NOTE — Procedures (Signed)
Central Venous Catheter Insertion Procedure Note  Ellis Mehaffey  [id number]  07-03-1933  Date:12/11/19  Time:2:07 AM   Provider Performing:Robinn Overholt D Dewaine Conger   Procedure: Insertion of Non-tunneled Central Venous [phone]) with US guidance ([id number])   Indication(s) Medication administration and Difficult access  Consent Risks of the procedure as well as the alternatives and risks of each were explained to the patient and/or caregiver.  Consent for the procedure was obtained and is signed in the bedside chart  Anesthesia Topical only with 1% lidocaine   Timeout Verified patient identification, verified procedure, site/side was marked, verified correct patient position, special equipment/implants available, medications/allergies/relevant history reviewed, required imaging and test results available.  Sterile Technique Maximal sterile technique including full sterile barrier drape, hand hygiene, sterile gown, sterile gloves, mask, hair covering, sterile ultrasound probe cover (if used).  Procedure Description Area of catheter insertion was cleaned with chlorhexidine and draped in sterile fashion.  With real-time ultrasound guidance a central venous catheter was placed into the left femoral vein. Nonpulsatile blood flow and easy flushing noted in all ports.  The catheter was sutured in place and sterile dressing applied.  Complications/Tolerance None; patient tolerated the procedure well. Chest X-ray is ordered to verify placement for internal jugular or subclavian cannulation.   Chest x-ray is not ordered for femoral cannulation.  EBL Minimal  Specimen(s) None   Line was secured at the 20 cm mark.  BIOPATCH applied to the insertion site.     Darel Hong, AGACNP-BC [facility] Pager: [phone]

## 2019-12-11 NOTE — Consult Note (Signed)
ANTICOAGULATION CONSULT NOTE - Initial Consult  Pharmacy Consult for Heparin Indication: DVT  Allergies  Allergen Reactions  . Ace Inhibitors Other (See Comments)    Potassium elevated  . Gabapentin Other (See Comments)    Ataxia at 100 mg.  . Losartan Other (See Comments)    Hyperkalemia    Patient Measurements: Height: _0  (167.6 cm) Weight: 74.5 kg (164 lb 4.8 oz) IBW/kg (Calculated) : 63.8 Heparin Dosing Weight: 74 kg  Vital Signs: Temp: 98.1 F (36.7 C) (07/07 0800) Temp Source: Oral (07/07 0800) BP: 103/32 (07/07 0800) Pulse Rate: 64 (07/07 0800)  Labs: Recent Labs    12/09/19 0817 12/09/19 0817 12/09/19 1112 12/09/19 1113 12/10/19 0606 12/10/19 2111 12/11/19 1006  HGB TEST WILL BE CREDITED   < >  --  10.3* 10.8*  --   --   HCT TEST WILL BE CREDITED  --   --  27.7* 28.9*  --   --   PLT TEST WILL BE CREDITED  --   --  215 192  --   --   APTT  --   --   --   --   --  41*  --   LABPROT  --   --   --   --   --  13.7  --   INR  --   --   --   --   --  1.1  --   HEPARINUNFRC  --   --   --   --   --   --  0.34  CREATININE  --   --  7.60*  --  8.61*  --   --    < > = values in this interval not displayed.    Estimated Creatinine Clearance: 5.8 mL/min (A) (by C-G formula based on SCr of 8.61 mg/dL (H)).   Medical History: Past Medical History:  Diagnosis Date  . Cancer ([place])   . CHF (congestive heart failure) ([place])   . COPD (chronic obstructive pulmonary disease) ([place])   . Diabetes mellitus without complication ([place])   . Hypertension   . Renal disorder     Medications:  Medications Prior to Admission  Medication Sig Dispense Refill Last Dose  . amLODipine (NORVASC) 2.5 MG tablet Take 2.5 mg by mouth daily.    11/27/2019 at Unknown time  . aspirin 81 MG chewable tablet Chew 81 mg by mouth daily.    11/27/2019 at Unknown time  . atorvastatin (LIPITOR) 80 MG tablet Take 80 mg by mouth daily.    11/27/2019 at Unknown time  . B Complex-C-Folic Acid  (TRIPHROCAPS) 1 MG CAPS Take 1 capsule by mouth daily.   11/27/2019 at Unknown time  . Cholecalciferol (VITAMIN D3) 1000 units CAPS Take 1,000 Units by mouth daily.    11/27/2019 at Unknown time  . clopidogrel (PLAVIX) 75 MG tablet Take 75 mg by mouth daily.    11/27/2019 at Unknown time  . ferrous sulfate 325 (65 FE) MG EC tablet Take 325 mg by mouth daily with breakfast.   11/27/2019 at Unknown time  . GLIPIZIDE XL 10 MG 24 hr tablet Take 10 mg by mouth daily with breakfast.    11/27/2019 at Unknown time  . hydrALAZINE (APRESOLINE) 25 MG tablet Take 25 mg by mouth 3 (three) times daily.    11/27/2019 at Unknown time  . isosorbide dinitrate (ISORDIL) 20 MG tablet Take 20 mg by mouth 3 (three) times daily.   11/27/2019 at Unknown time  .  metoprolol succinate (TOPROL-XL) 25 MG 24 hr tablet Take 25 mg by mouth daily.    11/27/2019 at Unknown time  . mirtazapine (REMERON) 7.5 MG tablet Take 7.5 mg by mouth at bedtime.    11/27/2019 at Unknown time  . pantoprazole (PROTONIX) 40 MG tablet Take 40 mg by mouth daily.   11/27/2019 at Unknown time  . sevelamer carbonate (RENVELA) 800 MG tablet Take 800-1,600 mg by mouth in the morning, at noon, in the evening, and at bedtime.   11/27/2019 at Unknown time  . torsemide (DEMADEX) 20 MG tablet Take 20 mg by mouth daily.    11/27/2019 at Unknown time  . glucagon (GLUCAGON EMERGENCY) 1 MG injection as needed. Reported on 06/02/2015   unknown at prn  . glucose blood (PRECISION QID TEST) test strip Accu check Aviva strips, use twice daily     . nitroGLYCERIN (NITROSTAT) 0.3 MG SL tablet Place 0.3 mg under the tongue every 5 (five) minutes as needed for chest pain.    unknown at prn   Scheduled:  . sodium chloride   Intravenous Once  . amiodarone  100 mg Oral Daily  . chlorhexidine gluconate (MEDLINE KIT)  15 mL Mouth Rinse BID  . Chlorhexidine Gluconate Cloth  6 each Topical Q0600  . [START ON 12/12/2019] epoetin (EPOGEN/PROCRIT) injection  2,000 Units Intravenous Q  T,Th,Sa-HD  . feeding supplement (NEPRO CARB STEADY)  237 mL Oral BID BM  . insulin aspart  0-5 Units Subcutaneous QHS  . insulin aspart  0-6 Units Subcutaneous TID WC  . midodrine  10 mg Oral TID WC  . multivitamin  1 tablet Oral QHS  . sodium chloride flush  10-40 mL Intracatheter Q12H   Infusions:  . sodium chloride Stopped (12/06/19 1834)  . sodium chloride Stopped (11/28/19 2021)  . sodium chloride    . sodium chloride    . famotidine (PEPCID) IV 20 mg (12/11/19 0916)  . heparin 1,150 Units/hr (12/11/19 0208)   PRN: sodium chloride, sodium chloride, alteplase, bisacodyl, heparin, HYDROmorphone (DILAUDID) injection, lidocaine (PF), lidocaine-prilocaine, ondansetron (ZOFRAN) IV, pentafluoroprop-tetrafluoroeth, polyethylene glycol, sodium chloride flush Anti-infectives (From admission, onward)   Start     Dose/Rate Route Frequency Ordered Stop   11/28/19 1545  ceFEPIme (MAXIPIME) 2 g in sodium chloride 0.9 % 100 mL IVPB        2 g 200 mL/hr over 30 Minutes Intravenous  Once 11/28/19 1536 11/28/19 1646   11/28/19 1545  vancomycin (VANCOCIN) IVPB 1000 mg/200 mL premix        1,000 mg 200 mL/hr over 60 Minutes Intravenous  Once 11/28/19 1536 11/28/19 1722      Assessment: Pharmacy consulted to start heparin for DVT. No DOAC noted PTA. Heparin started at 0213 on 7/7.   7/7 1006 HL 0.34   Goal of Therapy:  Heparin level 0.3-0.7 units/ml Monitor platelets by anticoagulation protocol: Yes   Plan:  Heparin level is therapeutic. Will continue heparin infusion at 1150 unit/hr. Recheck anti-xa level in 8 hours and CBC daily while on heparin.   Oswald Hillock, PharmD, BCPS 12/11/2019,11:31 AM

## 2019-12-11 NOTE — Progress Notes (Signed)
RT called to room for pt desat. FiO2 increased to 35% Flow to 8L

## 2019-12-11 NOTE — Progress Notes (Signed)
[facility]  ROUNDING NOTE   Subjective:   Doing fair. Denies any acute complaints Wife at bedside States he is able to eat without nausea or vomiting Denies any acute shortness of breath  Objective:  Vital signs in last 24 hours:  Temp:  [97.9 F (36.6 C)-98.9 F (37.2 C)] 98.1 F (36.7 C) (07/07 0800) Pulse Rate:  [34-157] 64 (07/07 0800) Resp:  [15-33] 20 (07/07 0800) BP: (56-126)/(32-104) 103/32 (07/07 0800) SpO2:  [85 %-97 %] 95 % (07/07 0800) FiO2 (%):  [28 %] 28 % (07/06 2005) Weight:  [72.5 kg-74.5 kg] 74.5 kg (07/07 0539)  Weight change: -1.452 kg Filed Weights   12/10/19 0357 12/11/19 0353 12/11/19 0539  Weight: 74 kg 72.5 kg 74.5 kg    Intake/Output: I/O last 3 completed shifts: In: 299.9 [I.V.:299.9] Out: 1000 [Other:1000]   Intake/Output this shift:  Total I/O In: 240 [P.O.:240] Out: 0   Physical Exam: General:  No acute distress, laying in the bed  HEENT  anicteric, moist oral mucous membrane, status post laryngectomy,   Pulm/lungs  normal breathing effort,  tracheostomy  CVS/Heart  no rub or gallop, irregular rhythm, atrial fibrillation  Abdomen:   Soft, nontender  Extremities:  + Dependent peripheral edema in thighs and arms  Neurologic:  Alert, able to follow commands  Skin:  No acute rashes   Access: Left arm AVG    Basic Metabolic Panel: Recent Labs  Lab 12/06/19 0441 12/06/19 0441 12/07/19 0526 12/07/19 0526 12/08/19 0725 12/09/19 1112 12/10/19 0606 12/11/19 0532  NA 139  --  137  --  136 134* 135  --   K 3.0*  --  3.5  --  3.7 3.7 3.7  --   CL 93*  --  92*  --  92* 92* 90*  --   CO2 30  --  30  --  26 25 24   --   GLUCOSE 168*  --  258*  --  180* 226* 156*  --   BUN 42*  --  48*  --  61* 68* 77*  --   CREATININE 5.23*  --  6.26*  --  7.07* 7.60* 8.61*  --   CALCIUM 8.0*   < > 8.3*   < > 8.5* 8.1* 8.1*  --   MG 1.9   < > 1.9  --  2.1 2.2 2.1 2.0  PHOS 3.8   < > 4.1  --  5.1* 5.7* 6.4* 4.3   < > = values in this  interval not displayed.    Liver Function Tests: No results for input(s): AST, ALT, ALKPHOS, BILITOT, PROT, ALBUMIN in the last 168 hours. No results for input(s): LIPASE, AMYLASE in the last 168 hours. No results for input(s): AMMONIA in the last 168 hours.  CBC: Recent Labs  Lab 12/05/19 0507 12/05/19 0507 12/06/19 0441 12/06/19 0441 12/07/19 0526 12/07/19 0526 12/07/19 0849 12/08/19 0725 12/09/19 0817 12/09/19 1113 12/10/19 0606  WBC 13.6*   < > 12.7*   < > 11.4*   < > 11.1* 10.6* RESULTS ENTERED IN ERROR 10.7* 11.3*  NEUTROABS 11.3*  --  10.7*  --  9.6*  --   --   --   --   --   --   HGB 10.2*   < > 10.1*   < > 9.4*   < > 9.6* 9.8* TEST WILL BE CREDITED 10.3* 10.8*  HCT 27.5*   < > 27.8*   < > 25.9*   < >  26.7* RESULTS UNAVAILABLE DUE TO INTERFERING SUBSTANCE TEST WILL BE CREDITED 27.7* 28.9*  MCV 85.7   < > 89.4   < > 88.7   < > 89.6 RESULTS UNAVAILABLE DUE TO INTERFERING SUBSTANCE TEST WILL BE CREDITED 87.7 87.0  PLT 210   < > 224   < > 221   < > 209 201 TEST WILL BE CREDITED 215 192   < > = values in this interval not displayed.    Cardiac Enzymes: No results for input(s): CKTOTAL, CKMB, CKMBINDEX, TROPONINI in the last 168 hours.  BNP: Invalid input(s): POCBNP  CBG: Recent Labs  Lab 12/10/19 0728 12/10/19 1120 12/11/19 0012 12/11/19 0810 12/11/19 1135  GLUCAP 157* 173* 158* 136* 176*    Microbiology: Results for orders placed or performed during the hospital encounter of 11/28/19  Culture, blood (routine x 2)     Status: None   Collection Time: 11/28/19  1:31 PM   Specimen: BLOOD  Result Value Ref Range Status   Specimen Description BLOOD BLOOD RIGHT ARM  Final   Special Requests   Final    BOTTLES DRAWN AEROBIC AND ANAEROBIC Blood Culture adequate volume   Culture   Final    NO GROWTH 5 DAYS Performed at [facility], [address] [place], [place] [address]    Report Status 12/03/2019 FINAL  Final  Culture, blood (routine x 2)      Status: None   Collection Time: 11/28/19  2:17 PM   Specimen: BLOOD  Result Value Ref Range Status   Specimen Description BLOOD BLOOD RIGHT WRIST  Final   Special Requests   Final    BOTTLES DRAWN AEROBIC AND ANAEROBIC Blood Culture adequate volume   Culture   Final    NO GROWTH 5 DAYS Performed at [facility], [address], [place], [place] [address]    Report Status 12/03/2019 FINAL  Final  SARS Coronavirus 2 by RT PCR (hospital order, performed in [facility] hospital lab) Nasopharyngeal Nasopharyngeal Swab     Status: None   Collection Time: 11/28/19  4:40 PM   Specimen: Nasopharyngeal Swab  Result Value Ref Range Status   SARS Coronavirus 2 NEGATIVE NEGATIVE Final    Comment: (NOTE) SARS-CoV-2 target nucleic acids are NOT DETECTED.  The SARS-CoV-2 RNA is generally detectable in upper and lower respiratory specimens during the acute phase of infection. The lowest concentration of SARS-CoV-2 viral copies this assay can detect is 250 copies / mL. A negative result does not preclude SARS-CoV-2 infection and should not be used as the sole basis for treatment or other patient management decisions.  A negative result may occur with improper specimen collection / handling, submission of specimen other than nasopharyngeal swab, presence of viral mutation(s) within the areas targeted by this assay, and inadequate number of viral copies (<250 copies / mL). A negative result must be combined with clinical observations, patient history, and epidemiological information.  Fact Sheet for Patients:   StrictlyIdeas.no  Fact Sheet for Healthcare Providers: BankingDealers.co.za  This test is not yet approved or  cleared by the [place] FDA and has been authorized for detection and/or diagnosis of SARS-CoV-2 by FDA under an Emergency Use Authorization (EUA).  This EUA will remain in effect (meaning this test can be used) for the  duration of the COVID-19 declaration under Section 564(b)(1) of the Act, 21 U.S.C. section 360bbb-3(b)(1), unless the authorization is terminated or revoked sooner.  Performed at [facility], [address] [place], [place] [address]   MRSA PCR Screening     Status: None   Collection Time: 11/28/19  9:08 PM   Specimen: Nasopharyngeal  Result Value Ref Range Status   MRSA by PCR NEGATIVE NEGATIVE Final    Comment:        The GeneXpert MRSA Assay (FDA approved for NASAL specimens only), is one component of a comprehensive MRSA colonization surveillance program. It is not intended to diagnose MRSA infection nor to guide or monitor treatment for MRSA infections. Performed at [facility], [address] [place], [place] [address]     Coagulation Studies: Recent Labs    12/10/19 Aug 28, 2109  LABPROT 13.7  INR 1.1    Urinalysis: No results for input(s): COLORURINE, LABSPEC, PHURINE, GLUCOSEU, HGBUR, BILIRUBINUR, KETONESUR, PROTEINUR, UROBILINOGEN, NITRITE, LEUKOCYTESUR in the last 72 hours.  Invalid input(s): APPERANCEUR    Imaging: US Venous Img Upper Uni Right(DVT)  Result Date: 12/10/2019 CLINICAL DATA:  Right upper extremity pain and. History of end-stage renal disease. Former smoker. Evaluate for DVT. EXAM: RIGHT UPPER EXTREMITY VENOUS DOPPLER ULTRASOUND TECHNIQUE: Gray-scale sonography with graded compression, as well as color Doppler and duplex ultrasound were performed to evaluate the upper extremity deep venous system from the level of the subclavian vein and including the jugular, axillary, basilic, radial, ulnar and upper cephalic vein. Spectral Doppler was utilized to evaluate flow at rest and with distal augmentation maneuvers. COMPARISON:  Chest radiograph-01/01/2020 FINDINGS: Contralateral Subclavian Vein: Respiratory phasicity is normal and symmetric with the symptomatic side. No evidence of thrombus. Normal compressibility. Internal Jugular Vein:  There is mixed echogenic occlusive thrombus within the right internal jugular vein (images 2 through 5). Subclavian Vein: No evidence of thrombus. Normal compressibility, respiratory phasicity and response to augmentation. Axillary Vein: No evidence of thrombus. Normal compressibility, respiratory phasicity and response to augmentation. Cephalic Vein: There is mixed echogenic occlusive thrombus involving the right cephalic vein extending from the level of the distal forearm (image 29), through the antecubital fossa (image 30), to the proximal humerus (image 31). Note is made of echogenic calcifications involving the thrombus within the mid aspect of the cephalic vein (image 32). Basilic Vein: No evidence of thrombus. Normal compressibility, respiratory phasicity and response to augmentation. Brachial Veins: No evidence of thrombus. Normal compressibility, respiratory phasicity and response to augmentation. Radial Veins: No evidence of thrombus. Normal compressibility, respiratory phasicity and response to augmentation. Ulnar Veins: No evidence of thrombus. Normal compressibility, respiratory phasicity and response to augmentation. Other Findings:  None visualized. IMPRESSION: 1. The examination is positive for age-indeterminate occlusive DVT within the right internal jugular vein. Given history of end-stage renal disease, this could represent the sequela of previous dialysis catheter placement. Clinical correlation is advised. 2. The examination is positive for occlusive SVT involving the right cephalic vein, age indeterminate though given suspected calcifications, is favored to be chronic in etiology. Electronically Signed   By: Sandi Mariscal M.D.   On: 12/10/2019 14:29     Medications:   . sodium chloride Stopped (12/06/19 1834)  . sodium chloride Stopped (11/28/19 29-Aug-2019)  . sodium chloride    . sodium chloride    . famotidine (PEPCID) IV 20 mg (12/11/19 0916)   . sodium chloride   Intravenous Once  .  amiodarone  100 mg Oral Daily  . apixaban  10 mg Oral BID   Followed by  . [START ON 12/18/2019] apixaban  5 mg Oral BID  . chlorhexidine gluconate (MEDLINE KIT)  15 mL Mouth Rinse  BID  . Chlorhexidine Gluconate Cloth  6 each Topical Q0600  . [START ON 12/12/2019] epoetin (EPOGEN/PROCRIT) injection  2,000 Units Intravenous Q T,Th,Sa-HD  . feeding supplement (NEPRO CARB STEADY)  237 mL Oral BID BM  . insulin aspart  0-5 Units Subcutaneous QHS  . insulin aspart  0-6 Units Subcutaneous TID WC  . midodrine  10 mg Oral TID WC  . multivitamin  1 tablet Oral QHS  . sodium chloride flush  10-40 mL Intracatheter Q12H   sodium chloride, sodium chloride, alteplase, bisacodyl, heparin, HYDROmorphone (DILAUDID) injection, lidocaine (PF), lidocaine-prilocaine, ondansetron (ZOFRAN) IV, pentafluoroprop-tetrafluoroeth, polyethylene glycol, sodium chloride flush  Assessment/ Plan:  [name] is a 86 y.o. 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 End-stage renal disease with volume overload:   2 L was able to be removed with HD on Saturday and another 2 L on Monday Midodrine and IV albumin supplementation for hypotension Next hemodialysis planned for Thursday.  Volume removal as tolerated  2. Hypokalemia:  Potassium in normal range today  3.  Atrial fibrillation  Management as per cardiology Rate control with amiodarone.  4. Anemia with chronic kidney disease: receives Mircera as outpatient.  PRBC transfusion 6/29.  - EPO with scheduled HD treatment.  Lab  Results  Component Value Date   HGB 10.8 (L) 12/10/2019      LOS: 13 Nikeisha Klutz 7/7/20211:11 PM

## 2019-12-11 NOTE — Progress Notes (Signed)
Noted during assessment-  Right arm looks red, swollen and warm to the touch.   MD aware- going to order U/S to check for DVT

## 2019-12-11 NOTE — Progress Notes (Signed)
Physical Therapy Treatment Patient Details Name: Kerry [name] MRN: [id number] DOB: 12/02/1933 Today's Date: 12/11/2019    History of Present Illness Pt is an 86 y.o. male (per H&P): "with advanced CHF and COPD, three-vessel CAD, sleep apnea, diverticulosis, end-stage renal failure on dialysis, squamous cell CA of upper and lower airway with vocal cord paralysis, status post laryngectomy with tracheostomy status who recently had signs of pneumonia with respiratory cultures and bronchoscopy done at [facility] with BAL showing noncryptococcal yeast forms and scattered bacterial cocci negative for AFB, came in after cardiac arrest.  Apparently patient lost pulse while having his the most recent dialysis session.  Patient had also developed A. fib RVR and was apneic in the field and required bag mask ventilation.  After arrival to the ER patient again coded x2 with V. fib arrest status post ACLS and ROSC    PT Comments    Pt in bed upon arrival to room and agreeable to PT session in bed. Educated pt on attending MD's concern of L femoral central venous catheter placement and limited to bed level activities for today's session. Pt performed therex with HOB elevated with emphasis on RLE and minimal LLE hip flexion to prevent/reduce stress to L femoral catheter placement. Pt's RR noted to increase as high as 28 breaths per minute however heart rate remained in upper 60s/low 70s with SpO2 remaining above 93% throughout exercises. Pt fatigued with activities demonstrating decreased functional activity tolerance. Will continue to follow this acute stay and will progress pt as tolerated. Continue to recommend skilled therapy in hospital and at discharge to address current deficits.   Follow Up Recommendations  SNF     Equipment Recommendations  Rolling walker with 5" wheels;3in1 (PT)    Recommendations for Other Services       Precautions / Restrictions Precautions Precautions:  Fall Precaution Comments: Trach; R femoral CVC; L UE fistula; aspiration; HOB >30 degrees Restrictions Weight Bearing Restrictions: No    Mobility  Bed Mobility               General bed mobility comments: not performed secondary to request from attending MD about concerns with L femoral central venous catheter  Transfers                 General transfer comment: not performed secondary to request from attending MD about concerns with L femoral central venous catheter  Ambulation/Gait                 Stairs             Wheelchair Mobility    Modified Rankin (Stroke Patients Only)       Balance                                            Cognition Arousal/Alertness: Awake/alert Behavior During Therapy: WFL for tasks assessed/performed Overall Cognitive Status: Within Functional Limits for tasks assessed                                        Exercises Other Exercises Other Exercises: in supine with HOB elevated, pt performed bilat QS x 10, bilat SAQ x 10, bilat hip ab/add x 15, and RLE AAROM SLR     General Comments  Pertinent Vitals/Pain Pain Assessment: No/denies pain    Home Living                      Prior Function            PT Goals (current goals can now be found in the care plan section) Acute Rehab PT Goals Patient Stated Goal: to be able to sit up PT Goal Formulation: With patient Time For Goal Achievement: 12/20/19 Potential to Achieve Goals: Good Progress towards PT goals: Progressing toward goals    Frequency    Min 2X/week      PT Plan Current plan remains appropriate    Co-evaluation              AM-PAC PT "6 Clicks" Mobility   Outcome Measure  Help needed turning from your back to your side while in a flat bed without using bedrails?: A Little Help needed moving from lying on your back to sitting on the side of a flat bed without using  bedrails?: A Little Help needed moving to and from a bed to a chair (including a wheelchair)?: A Little Help needed standing up from a chair using your arms (e.g., wheelchair or bedside chair)?: A Lot Help needed to walk in hospital room?: A Lot Help needed climbing 3-5 steps with a railing? : Total 6 Click Score: 14    End of Session Equipment Utilized During Treatment: Oxygen Activity Tolerance: Patient limited by fatigue Patient left: in bed;with call bell/phone within reach;with bed alarm set;with family/visitor present;with SCD's reapplied Nurse Communication: Precautions PT Visit Diagnosis: Other abnormalities of gait and mobility (R26.89);Muscle weakness (generalized) (M62.81);Difficulty in walking, not elsewhere classified (R26.2)     Time: 1410-3013 PT Time Calculation (min) (ACUTE ONLY): 15 min  Charges:                        Vale Haven, SPT   Vale Haven 12/11/2019, 4:50 PM

## 2019-12-12 LAB — HEMOGLOBIN AND HEMATOCRIT, BLOOD
HCT: 28.1 % — ABNORMAL LOW (ref 39.0–52.0)
Hemoglobin: 10.1 g/dL — ABNORMAL LOW (ref 13.0–17.0)

## 2019-12-12 LAB — GLUCOSE, CAPILLARY
Glucose-Capillary: 138 mg/dL — ABNORMAL HIGH (ref 70–99)
Glucose-Capillary: 100 mg/dL — ABNORMAL HIGH (ref 70–99)
Glucose-Capillary: 168 mg/dL — ABNORMAL HIGH (ref 70–99)

## 2019-12-12 LAB — PHOSPHORUS: Phosphorus: 5.8 mg/dL — ABNORMAL HIGH (ref 2.5–4.6)

## 2019-12-12 LAB — MAGNESIUM: Magnesium: 2.2 mg/dL (ref 1.7–2.4)

## 2019-12-12 MED ORDER — RENA-VITE PO TABS: 1.0000 | ORAL_TABLET | Freq: Every day | ORAL | 0 refills | Status: AC

## 2019-12-12 MED ORDER — METOPROLOL SUCCINATE ER 25 MG PO TB24: 12.5000 mg | ORAL_TABLET | Freq: Every day | ORAL | 0 refills | Status: DC

## 2019-12-12 MED ORDER — FAMOTIDINE 20 MG PO TABS
20.0000 mg | ORAL_TABLET | Freq: Every day | ORAL | Status: DC
  Administered 2019-12-12 – 2019-12-17 (×6): 20 mg via ORAL
  Filled 2019-12-12 (×6): qty 1

## 2019-12-12 MED ORDER — ALTEPLASE 2 MG IJ SOLR: 2.0000 mg | Freq: Once | INTRAMUSCULAR | 0 refills | Status: AC | PRN

## 2019-12-12 MED ORDER — LIDOCAINE-PRILOCAINE 2.5-2.5 % EX CREA: 1.0000 "application " | TOPICAL_CREAM | CUTANEOUS | 0 refills | Status: AC | PRN

## 2019-12-12 MED ORDER — CHLORHEXIDINE GLUCONATE CLOTH 2 % EX PADS: 6.0000 | MEDICATED_PAD | Freq: Every day | CUTANEOUS | 0 refills | Status: AC

## 2019-12-12 MED ORDER — NEPRO/CARBSTEADY PO LIQD: 237.0000 mL | Freq: Two times a day (BID) | ORAL | 0 refills | Status: AC

## 2019-12-12 MED ORDER — MIDODRINE HCL 10 MG PO TABS: 10.0000 mg | ORAL_TABLET | Freq: Three times a day (TID) | ORAL | 0 refills | Status: AC

## 2019-12-12 MED ORDER — AMIODARONE HCL 100 MG PO TABS: 100.0000 mg | ORAL_TABLET | Freq: Every day | ORAL | 0 refills | Status: AC

## 2019-12-12 MED ORDER — POLYETHYLENE GLYCOL 3350 17 G PO PACK: 17.0000 g | PACK | Freq: Every day | ORAL | 0 refills | Status: DC | PRN

## 2019-12-12 MED ORDER — APIXABAN 5 MG PO TABS: 10.0000 mg | ORAL_TABLET | Freq: Two times a day (BID) | ORAL | 0 refills | Status: DC

## 2019-12-12 MED ORDER — APIXABAN 5 MG PO TABS: 5.0000 mg | ORAL_TABLET | Freq: Two times a day (BID) | ORAL | 1 refills | Status: DC

## 2019-12-12 MED ORDER — INSULIN ASPART 100 UNIT/ML ~~LOC~~ SOLN: 0.0000 [IU] | Freq: Every day | SUBCUTANEOUS | 11 refills | Status: DC

## 2019-12-12 MED ORDER — PENTAFLUOROPROP-TETRAFLUOROETH EX AERO: 1.0000 "application " | INHALATION_SPRAY | CUTANEOUS | 3 refills | Status: AC | PRN

## 2019-12-12 MED ORDER — CHLORHEXIDINE GLUCONATE 0.12% ORAL RINSE (MEDLINE KIT): 15.0000 mL | Freq: Two times a day (BID) | OROMUCOSAL | 0 refills | Status: DC

## 2019-12-12 NOTE — Progress Notes (Signed)
[facility] Cardiology    SUBJECTIVE: Patient resting comfortably in bed mild congestion scheduled for dialysis today no chest pain no palpitation no tachycardia.   Vitals:   12/11/19 2000 12/12/19 0500 12/12/19 0534 12/12/19 0820  BP:   (!) 127/54 (!) 120/48  Pulse: 66  64 61  Resp: (!) 22  (!) 22 19  Temp:   98.5 F (36.9 C)   TempSrc:   Oral   SpO2: 98%   90%  Weight:  72.7 kg    Height:         Intake/Output Summary (Last 24 hours) at 12/12/2019 0934 Last data filed at 12/11/2019 2248 Gross per 24 hour  Intake 198.44 ml  Output 0 ml  Net 198.44 ml      PHYSICAL EXAM  General: Well developed, well nourished, in no acute distress HEENT:  Normocephalic and atramatic permanent tracheostomy Neck:  No JVD.  Lungs: Clear bilaterally to auscultation and percussion. Heart: HRRR . Normal S1 and S2 without gallops or murmurs.  Abdomen: Bowel sounds are positive, abdomen soft and non-tender  Msk:  Back normal, normal gait. Normal strength and tone for age. Extremities: No clubbing, cyanosis or edema.   Neuro: Alert and oriented X 3. Psych:  Good affect, responds appropriately   LABS: Basic Metabolic Panel: Recent Labs    12/09/19 1112 12/09/19 1112 12/10/19 0606 12/10/19 0606 12/11/19 0532 12/12/19 0539  NA 134*  --  135  --   --   --   K 3.7  --  3.7  --   --   --   CL 92*  --  90*  --   --   --   CO2 25  --  24  --   --   --   GLUCOSE 226*  --  156*  --   --   --   BUN 68*  --  77*  --   --   --   CREATININE 7.60*  --  8.61*  --   --   --   CALCIUM 8.1*  --  8.1*  --   --   --   MG 2.2   < > 2.1   < > 2.0 2.2  PHOS 5.7*   < > 6.4*   < > 4.3 5.8*   < > = values in this interval not displayed.   Liver Function Tests: No results for input(s): AST, ALT, ALKPHOS, BILITOT, PROT, ALBUMIN in the last 72 hours. No results for input(s): LIPASE, AMYLASE in the last 72 hours. CBC: Recent Labs    12/09/19 1113 12/10/19 0606  WBC 10.7* 11.3*  HGB 10.3* 10.8*  HCT 27.7*  28.9*  MCV 87.7 87.0  PLT 215 192   Cardiac Enzymes: No results for input(s): CKTOTAL, CKMB, CKMBINDEX, TROPONINI in the last 72 hours. BNP: Invalid input(s): POCBNP D-Dimer: No results for input(s): DDIMER in the last 72 hours. Hemoglobin A1C: No results for input(s): HGBA1C in the last 72 hours. Fasting Lipid Panel: No results for input(s): CHOL, HDL, LDLCALC, TRIG, CHOLHDL, LDLDIRECT in the last 72 hours. Thyroid Function Tests: No results for input(s): TSH, T4TOTAL, T3FREE, THYROIDAB in the last 72 hours.  Invalid input(s): FREET3 Anemia Panel: No results for input(s): VITAMINB12, FOLATE, FERRITIN, TIBC, IRON, RETICCTPCT in the last 72 hours.  US Venous Img Upper Uni Right(DVT)  Result Date: 12/10/2019 CLINICAL DATA:  Right upper extremity pain and. History of end-stage renal disease. Former smoker. Evaluate for DVT. EXAM: RIGHT UPPER  EXTREMITY VENOUS DOPPLER ULTRASOUND TECHNIQUE: Gray-scale sonography with graded compression, as well as color Doppler and duplex ultrasound were performed to evaluate the upper extremity deep venous system from the level of the subclavian vein and including the jugular, axillary, basilic, radial, ulnar and upper cephalic vein. Spectral Doppler was utilized to evaluate flow at rest and with distal augmentation maneuvers. COMPARISON:  Chest radiograph-01/01/2020 FINDINGS: Contralateral Subclavian Vein: Respiratory phasicity is normal and symmetric with the symptomatic side. No evidence of thrombus. Normal compressibility. Internal Jugular Vein: There is mixed echogenic occlusive thrombus within the right internal jugular vein (images 2 through 5). Subclavian Vein: No evidence of thrombus. Normal compressibility, respiratory phasicity and response to augmentation. Axillary Vein: No evidence of thrombus. Normal compressibility, respiratory phasicity and response to augmentation. Cephalic Vein: There is mixed echogenic occlusive thrombus involving the right  cephalic vein extending from the level of the distal forearm (image 29), through the antecubital fossa (image 30), to the proximal humerus (image 31). Note is made of echogenic calcifications involving the thrombus within the mid aspect of the cephalic vein (image 32). Basilic Vein: No evidence of thrombus. Normal compressibility, respiratory phasicity and response to augmentation. Brachial Veins: No evidence of thrombus. Normal compressibility, respiratory phasicity and response to augmentation. Radial Veins: No evidence of thrombus. Normal compressibility, respiratory phasicity and response to augmentation. Ulnar Veins: No evidence of thrombus. Normal compressibility, respiratory phasicity and response to augmentation. Other Findings:  None visualized. IMPRESSION: 1. The examination is positive for age-indeterminate occlusive DVT within the right internal jugular vein. Given history of end-stage renal disease, this could represent the sequela of previous dialysis catheter placement. Clinical correlation is advised. 2. The examination is positive for occlusive SVT involving the right cephalic vein, age indeterminate though given suspected calcifications, is favored to be chronic in etiology. Electronically Signed   By: Sandi Mariscal M.D.   On: 12/10/2019 14:29     Echo reduced left ventricular function ejection fraction around 30%  TELEMETRY: Normal sinus rhythm nonspecific T2 changes  ASSESSMENT AND PLAN:  Active Problems:   Anemia of chronic disease   CKD (chronic kidney disease) stage 4, GFR 15-29 ml/min (HCC)   Coronary artery disease   Heart failure with preserved ejection fraction (HCC)   History of tracheostomy   Hypertension   Type 2 diabetes mellitus ([place])   Cardiac arrest ([place])    Plan Continue A. fib management and control Continue anticoagulation for atrial fibrillation Agree with dialysis therapy for end-stage renal disease Continue tracheostomy care Recommend treatment for  cardiomyopathy congestive heart failure with Entresto beta-blockers Persistent hypotension currently on midodrine consider adjusting dialysis treatments Continue diabetes management control Have patient follow-up with cardiology for A. fib outs+ AICD placement in the future as an outpatient   Yolonda Kida, MD 12/12/2019 9:34 AM

## 2019-12-12 NOTE — Progress Notes (Signed)
   12/11/19 1311  Assess: MEWS Score  BP (!) 70/26  Pulse Rate 72  ECG Heart Rate 72  Resp (!) 26  SpO2 (!) 77 %  Assess: MEWS Score  MEWS Temp 0  MEWS Systolic 2  MEWS Pulse 0  MEWS RR 2  MEWS LOC 0  MEWS Score 4  MEWS Score Color Red  Assess: if the MEWS score is Yellow or Red  Were vital signs taken at a resting state? Yes  Focused Assessment Documented focused assessment  Early Detection of Sepsis Score *See Row Information* Low  MEWS guidelines implemented *See Row Information* No, vital signs rechecked  Treat  MEWS Interventions Administered scheduled meds/treatments (midodrine was given )  Escalate  MEWS: Escalate Red: discuss with charge nurse/RN and provider, consider discussing with RRT  Notify: Charge Nurse/RN  Name of Charge Nurse/RN Notified Danae Chen RN   Date Charge Nurse/RN Notified 12/11/19  Time Charge Nurse/RN Notified 1345  Notify: Provider  Provider Name/Title Dr. Barrett Henle   Date Provider Notified 12/11/19  Time Provider Notified 1335  Notification Type Face-to-face  Notification Reason Other (Comment) (drop in BP)  Response No new orders;Other (Comment) (Will recheck in 1 hour after midodrine )  Date of Provider Response 12/11/19  Time of Provider Response 1346   Midodrine given-  Will recheck in 1 hour per MD.

## 2019-12-12 NOTE — Progress Notes (Signed)
This RN pulled pts femoral line around 1800. Catheter intact upon removal. Pressure held for 20 minutes and bleeding stopped and dressing was applied. About 5 minutes later site started bleeding again and so pressure was held for 10 more minutes and MD was notified. BP remained stable throughout this event. Left pedal pulse +2 with doppler. Bleeding stopped and a new dressing was applied. After about 10 minutes pt needed to get on the bedpan and this movement caused site to start bleeding again. Pressure was held for 5 minutes and bleeding stopped. A new dressing was applied and a 1L fluid bag was taped down to the site. Wife at bedside during entire event. At this time bleeding is still stopped and pt is resting comfortably. Nightshift nurses given report and will continue to monitor pt.

## 2019-12-12 NOTE — Discharge Summary (Addendum)
Physician Discharge Summary [facility]    Patient: Kerry [name]                   Admit date: 11/28/2019   DOB: [birth year]             Discharge date:12/12/2019/1:47 PM GBT:[id number]                          PCP: Cecile Sheerer, MD  Disposition: SNF   Recommendations for Outpatient Follow-up:   . Follow up: in 1 week  . Continue with hemodialysis as scheduled . Follow with nephrology . Palliative care consult  Discharge Condition: Stable   Code Status:   Code Status: Full Code  Diet recommendation: Regular healthy diet/   Discharge Diagnoses:    Active Problems:   Anemia of chronic disease   CKD (chronic kidney disease) stage 4, GFR 15-29 ml/min (HCC)   Coronary artery disease   Heart failure with preserved ejection fraction (HCC)   History of tracheostomy   Hypertension   Type 2 diabetes mellitus ([place])   Cardiac arrest ([place])   History of Present Illness/ Hospital Course Kerry [name] Summary:   Patient with advanced CHF and COPD, three-vessel CAD, sleep apnea, diverticulosis, end-stage renal failure on dialysis, 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   Apparently patient lost pulse while having his the most recent dialysis session. Patient had also developed A. fib RVR and was apneic in the field and required bag mask ventilation. After arrival to the ER patient again coded x2 with V. fib arrest status post ACLS and ROSC, found to be amiodarone and 20 MCG Levophed.[name] bedside. He underwent ACLS x 3. He was placed on the arctic sun protocol.  The patient required mechanical ventilation via tracheostomy. He was placed on an  amiodarone gtt. Triple lumen catheter was placed. TTE was performed and demonstrated EF 25-30%. 26% LVEF by PLAX. Grade 1 diastolic dysfunction. RV is mildly enlarged.   The patient remains on low dose amiodarone gtt. Pressors off this morning per nursing. Blood pressure have been stable since yesterday afternoon.   Subjective: The patient was seen and examined this morning, wife present at bedside.  On 5 L of oxygen, via tracheostomy .   Awake alert following command, right arm swelling .  Per wife no change from yesterday  active Problems: No acute distress, currently stable    Cardiac arrest ([place]) Acute on chronic respiratory failure History of laryngeal carcinoma History of laryngectomy     LOS: 13 days   A & P   Severe ACUTE Hypoxic and Hypercapnic Respiratory Failure:  Patient is currently stable, chronic tracheostomy,  currently on 2-4 L of oxygen via trach site neck Satting >95% With a history of laryngectomy   Laryngeal Carcinoma:  Stable. Chronic tracheostomy -PCCM, RT following, continue as needed nebs   Acute Systolic Cardiac Failure:  Remained stable on  oxygen, satting greater than 92% EF 20%. Complicated by Atrial fibrillation with RVR and ischemic cardiomyopathy.  SYSTOLIC CARDIAC FAILURE- EF20%. The patient continues to require IV amiodarone as he is still unable to take PO. No anticoagulation due to bleeding history and anemia. Cardiology does not feel  that arrest was a primary ischemic event. Echo did not reveal changes in wall motion or EF.   Hypotension:  -BP stabilized improved -Home BP meds modified, DC Pt is off of pressors.  ESRD on HD:  -Nephrology following closely -Hemodialysis per nephrology -Medications are dosed renal    Cardiogenic Shock:  -Status post ICD management, patient has been stabilized off pressors -BP stabilized We will continue to monitor closely   Altered Mental Status:  Improved, at baseline  now  Right arm edema, -Likely venous stasis -Ultrasound negative for DVT  Right IJ DVT, right cephalic vein SVT -Patient has been on heparin, will switch to Eliquis    DVT Prophylaxis: SCD's CODE STATUS: Full Code --- we recommend to continue palliative care follow-up for determining goals of care, CODE STATUS near future. Family Communication: Wife at bedside discussed in detail regarding goals of care, and palliative follow-up Disposition: From Home. Anticipate discharge to SNF/Rehab.   Dispo: To SNF Continue hemodialysis as scheduled -last hemodialysis today 12/12/2019 (Tuesday Thursday Saturday) Continue trach hygiene, with as needed suction Continue O2 via trach Continue palliative care discussion   Venous ultrasound  IMPRESSION: 1. The examination is positive for age-indeterminate occlusive DVT within the right internal jugular vein. Given history of end-stage renal disease, this could represent the sequela of previous dialysis catheter placement. Clinical correlation is advised. 2. The examination is positive for occlusive SVT involving the right cephalic vein, age indeterminate though given suspected calcifications, is favored to be chronic in etiology.    Consultants   Cardiology  Nephrology  PCCM  Procedures   Dialysis  Mechanical ventilation        Nutritional status:  Nutrition Problem: Increased nutrient needs Etiology: catabolic illness (CHF, COPD, ESRD on HD) Signs/Symptoms: estimated needs Interventions: Nepro shake, MVI   Discharge Instructions:   Discharge Instructions    Activity as tolerated - No restrictions   Complete by: As directed    Activity as tolerated - No restrictions   Complete by: As directed    Call MD for:  difficulty breathing, headache or visual disturbances   Complete by: As directed    Call MD for:  persistant nausea and vomiting   Complete by: As directed    Call MD for:  temperature >100.4    Complete by: As directed    Diet - low sodium heart healthy   Complete by: As directed    Discharge instructions   Complete by: As directed    Follow-up at nursing facility, continue trach hygiene... With suction. Continue aggressive PT OT. Fall precaution May taper down O2 demand, keep O2 sat greater than 92%.   Discharge instructions   Complete by: As directed    Continue palliative discussion Continue modalities as scheduled   Increase activity slowly   Complete by: As directed    Increase activity slowly   Complete by: As directed    Remove dressing in 24 hours   Complete by: As directed    Remove dressing in 48 hours   Complete by: As directed    Remove dressing in 48 hours   Complete by: As directed        Medication List    STOP taking these medications   amLODipine 2.5 MG tablet Commonly known as: NORVASC   aspirin 81 MG chewable tablet   clopidogrel 75 MG tablet Commonly known as: PLAVIX   hydrALAZINE 25 MG tablet Commonly known as: APRESOLINE   isosorbide dinitrate 20 MG tablet Commonly known  as: ISORDIL   mirtazapine 7.5 MG tablet Commonly known as: REMERON   nitroGLYCERIN 0.3 MG SL tablet Commonly known as: NITROSTAT   pantoprazole 40 MG tablet Commonly known as: PROTONIX   torsemide 20 MG tablet Commonly known as: DEMADEX   Triphrocaps 1 MG Caps Replaced by: multivitamin Tabs tablet   Vitamin D3 25 MCG (1000 UT) Caps     TAKE these medications   alteplase 2 MG injection Commonly known as: CATHFLO ACTIVASE 2 mg by Intracatheter route once as needed for open catheter.   amiodarone 100 MG tablet Commonly known as: PACERONE Take 1 tablet (100 mg total) by mouth daily. Start taking on: December 13, 2019   apixaban 5 MG Tabs tablet Commonly known as: ELIQUIS Take 2 tablets (10 mg total) by mouth 2 (two) times daily for 6 days.   apixaban 5 MG Tabs tablet Commonly known as: ELIQUIS Take 1 tablet (5 mg total) by mouth 2 (two) times  daily. Start taking on: December 18, 2019   atorvastatin 80 MG tablet Commonly known as: LIPITOR Take 80 mg by mouth daily.   chlorhexidine gluconate (MEDLINE KIT) 0.12 % solution Commonly known as: PERIDEX 15 mLs by Mouth Rinse route 2 (two) times daily.   Chlorhexidine Gluconate Cloth 2 % Pads Apply 6 each topically daily at 6 (six) AM. Start taking on: December 13, 2019   epoetin alfa 2000 UNIT/ML injection Commonly known as: EPOGEN Inject 1 mL (2,000 Units total) into the vein Every Tuesday,Thursday,and Saturday with dialysis.   feeding supplement (NEPRO CARB STEADY) Liqd Take 237 mLs by mouth 2 (two) times daily between meals for 10 days.   ferrous sulfate 325 (65 FE) MG EC tablet Take 325 mg by mouth daily with breakfast.   glipiZIDE XL 10 MG 24 hr tablet Generic drug: glipiZIDE Take 10 mg by mouth daily with breakfast.   Glucagon Emergency 1 MG Kit as needed. Reported on 06/02/2015   insulin aspart 100 UNIT/ML injection Commonly known as: novoLOG Inject 0-5 Units into the skin at bedtime.   lidocaine-prilocaine cream Commonly known as: EMLA Apply 1 application topically as needed (topical anesthesia for hemodialysis if Gebauers and Lidocaine injection are ineffective.).   metoprolol succinate 25 MG 24 hr tablet Commonly known as: TOPROL-XL Take 0.5 tablets (12.5 mg total) by mouth daily. What changed: how much to take   midodrine 10 MG tablet Commonly known as: PROAMATINE Take 1 tablet (10 mg total) by mouth 3 (three) times daily with meals.   multivitamin Tabs tablet Take 1 tablet by mouth at bedtime. Replaces: Triphrocaps 1 MG Caps   pentafluoroprop-tetrafluoroeth Aero Commonly known as: GEBAUERS Apply 1 application topically as needed (topical anesthesia for hemodialysis).   polyethylene glycol 17 g packet Commonly known as: MIRALAX / GLYCOLAX Place 17 g into feeding tube daily as needed for moderate constipation.   Precision QID Test test strip Generic  drug: glucose blood Accu check Aviva strips, use twice daily   sevelamer carbonate 800 MG tablet Commonly known as: RENVELA Take 800-1,600 mg by mouth in the morning, at noon, in the evening, and at bedtime.       Contact information for after-discharge care    Destination    [facility] Preferred SNF .   Service: Skilled Nursing Contact information: [place] [phone]                 Allergies  Allergen Reactions  . Ace Inhibitors Other (See  Comments)    Potassium elevated  . Gabapentin Other (See Comments)    Ataxia at 100 mg.  . Losartan Other (See Comments)    Hyperkalemia     Procedures /Studies:   CT HEAD WO CONTRAST  Result Date: 11/28/2019 CLINICAL DATA:  Status post cardiac arrest. EXAM: CT HEAD WITHOUT CONTRAST TECHNIQUE: Contiguous axial images were obtained from the base of the skull through the vertex without intravenous contrast. COMPARISON:  None. FINDINGS: Brain: There is mild cerebral atrophy with widening of the extra-axial spaces and ventricular dilatation. There are areas of decreased attenuation within the white matter tracts of the supratentorial brain, consistent with microvascular disease changes. Vascular: No hyperdense vessel or unexpected calcification. Skull: Normal. Negative for fracture or focal lesion. Sinuses/Orbits: No acute finding. Other: None. IMPRESSION: 1. Generalized cerebral atrophy. 2. No acute intracranial abnormality. Electronically Signed   By: Virgina Norfolk M.D.   On: 11/28/2019 18:06   CT ABDOMEN PELVIS W CONTRAST  Result Date: 12/06/2019 CLINICAL DATA:  Abdominal distention EXAM: CT ABDOMEN AND PELVIS WITH CONTRAST TECHNIQUE: Multidetector CT imaging of the abdomen and pelvis was performed using the standard protocol following bolus administration of intravenous contrast. CONTRAST:  163m OMNIPAQUE IOHEXOL 300 MG/ML  SOLN COMPARISON:  None. FINDINGS: Lower chest:  Moderate bilateral pleural effusions with compressive atelectasis in the lower lobes. Densely calcified visualized coronary arteries. Cardiomegaly. Hepatobiliary: Subtle layering high-density material within the gallbladder could reflect small stones or sludge/gravel. No focal hepatic abnormality. Pancreas: No focal abnormality or ductal dilatation. Spleen: No focal abnormality.  Normal size. Adrenals/Urinary Tract: Kidneys are small bilaterally. Reflux of contrast from the right heart through the IVC into the right renal veins suggests right heart dysfunction. No hydronephrosis. No renal or adrenal mass. Urinary bladder decompressed. Stomach/Bowel: Colonic diverticulosis. No active diverticulitis. Stomach and small bowel decompressed, unremarkable. Moderate stool burden in the colon. Vascular/Lymphatic: Aortic atherosclerosis. No enlarged abdominal or pelvic lymph nodes. Reproductive: Mildly prominent prostate. Other: Small to moderate free fluid in the abdomen and pelvis. Extensive subcutaneous edema throughout the subcutaneous soft tissues suggesting anasarca. Musculoskeletal: No acute bony abnormality. IMPRESSION: Moderate bilateral pleural effusions with compressive atelectasis in the lower lobes. Small kidneys bilaterally with poor excretion of contrast suggesting kidney disease. Aortic atherosclerosis. Small to moderate free fluid in the abdomen and pelvis. Diffuse anasarca like fluid throughout the subcutaneous soft tissues. Moderate stool burden in the colon.  Left colonic diverticulosis. Reflux of contrast from the right heart through the IVC into the hepatic veins and right renal vein suggesting right heart dysfunction. Electronically Signed   By: KRolm BaptiseM.D.   On: 12/06/2019 18:45   UKoreaVenous Img Upper Uni Right(DVT)  Result Date: 12/10/2019 CLINICAL DATA:  Right upper extremity pain and. History of end-stage renal disease. Former smoker. Evaluate for DVT. EXAM: RIGHT UPPER EXTREMITY VENOUS  DOPPLER ULTRASOUND TECHNIQUE: Gray-scale sonography with graded compression, as well as color Doppler and duplex ultrasound were performed to evaluate the upper extremity deep venous system from the level of the subclavian vein and including the jugular, axillary, basilic, radial, ulnar and upper cephalic vein. Spectral Doppler was utilized to evaluate flow at rest and with distal augmentation maneuvers. COMPARISON:  Chest radiograph-01/01/2020 FINDINGS: Contralateral Subclavian Vein: Respiratory phasicity is normal and symmetric with the symptomatic side. No evidence of thrombus. Normal compressibility. Internal Jugular Vein: There is mixed echogenic occlusive thrombus within the right internal jugular vein (images 2 through 5). Subclavian Vein: No evidence of thrombus. Normal compressibility, respiratory phasicity and  response to augmentation. Axillary Vein: No evidence of thrombus. Normal compressibility, respiratory phasicity and response to augmentation. Cephalic Vein: There is mixed echogenic occlusive thrombus involving the right cephalic vein extending from the level of the distal forearm (image 29), through the antecubital fossa (image 30), to the proximal humerus (image 31). Note is made of echogenic calcifications involving the thrombus within the mid aspect of the cephalic vein (image 32). Basilic Vein: No evidence of thrombus. Normal compressibility, respiratory phasicity and response to augmentation. Brachial Veins: No evidence of thrombus. Normal compressibility, respiratory phasicity and response to augmentation. Radial Veins: No evidence of thrombus. Normal compressibility, respiratory phasicity and response to augmentation. Ulnar Veins: No evidence of thrombus. Normal compressibility, respiratory phasicity and response to augmentation. Other Findings:  None visualized. IMPRESSION: 1. The examination is positive for age-indeterminate occlusive DVT within the right internal jugular vein. Given  history of end-stage renal disease, this could represent the sequela of previous dialysis catheter placement. Clinical correlation is advised. 2. The examination is positive for occlusive SVT involving the right cephalic vein, age indeterminate though given suspected calcifications, is favored to be chronic in etiology. Electronically Signed   By: Sandi Mariscal M.D.   On: 12/10/2019 14:29   DG Chest Port 1 View  Result Date: 12/02/2019 CLINICAL DATA:  Acute respiratory failure. EXAM: PORTABLE CHEST 1 VIEW COMPARISON:  11/30/2019 FINDINGS: The tracheostomy tube is stable. External pacer paddles are again noted. Stable mild cardiac enlargement and persistent lower lobe predominant airspace process and bilateral pleural effusions. IMPRESSION: Persistent lower lobe predominant airspace process and bilateral pleural effusions. Electronically Signed   By: Marijo Sanes M.D.   On: 12/02/2019 07:23   DG Chest Port 1 View  Result Date: 11/30/2019 CLINICAL DATA:  Acute respiratory failure EXAM: PORTABLE CHEST 1 VIEW COMPARISON:  11/28/2019 FINDINGS: Single frontal view of the chest demonstrates tracheostomy tube overlying tracheal air column tip at thoracic inlet. External defibrillator pads are again noted. Cardiac silhouette is unremarkable. Since the prior exam, there is decreased interstitial prominence. Persistent central vascular congestion with trace right pleural effusion. No pneumothorax. IMPRESSION: 1. Persistent central vascular congestion and trace right pleural effusion, with improving interstitial edema since prior exam. Electronically Signed   By: Randa Ngo M.D.   On: 11/30/2019 03:14   DG Chest Portable 1 View  Result Date: 11/28/2019 CLINICAL DATA:  Status post cardiac arrest during dialysis. EXAM: PORTABLE CHEST 1 VIEW COMPARISON:  Chest x-ray dated 07/19/2017 FINDINGS: Endotracheal tube in good position 4.7 cm above the carina. Heart size is normal. Slight bilateral haziness, left greater  than right, likely represents mild edema. No consolidative infiltrates or effusions. No bone abnormality. IMPRESSION: 1. Endotracheal tube in good position. 2. Mild pulmonary edema. Electronically Signed   By: Lorriane Shire M.D.   On: 11/28/2019 14:33   EEG adult  Result Date: 11/29/2019 Alexis Goodell, MD     11/29/2019  7:02 PM ELECTROENCEPHALOGRAM REPORT Patient: Judea Riches       Room #: IC17A-AA EEG No. ID: [id number] Age: 86 y.o.        Sex: male Requesting Physician: Kasa Report Date:  11/29/2019       Interpreting Physician: Alexis Goodell History: Glendal Cassaday is an 86 y.o. male s/p arrest Medications: Propofol, Fentanyl, Levophed, Insulin Conditions of Recording:  This is a 21 channel routine scalp EEG performed with bipolar and monopolar montages arranged in accordance to the international 10/20 system of electrode placement. One channel was dedicated to EKG recording. The  patient is in the intubated and sedated state. Description:  The background activity is slow and poorly organized.  It consists of a low voltage polymorphic delta activity that is continuous and diffusely distributed.  No epileptiform activity is noted.  There is no activation of the background rhythm noted with noxious stimuli.  Marland Kitchen  Hyperventilation and intermittent photic stimulation were not performed. IMPRESSION: This is an abnormal EEG secondary to general background slowing.  This finding may be seen with a diffuse cerebral disturbance that is etiologically nonspecific, but may include a medication effect, among other possibilities.  No epileptiform activity was noted.  Background lacked reactivity.  Alexis Goodell, MD Neurology [phone] 11/29/2019, 6:58 PM   ECHOCARDIOGRAM COMPLETE  Result Date: 11/30/2019    ECHOCARDIOGRAM REPORT   Patient Name:   [facility] Date of Exam: 11/30/2019 Medical Rec #:  [id number]     Height:       66.0 in Accession #:    [id number]    Weight:       157.0 lb Date of Birth:  December 27, 1933      BSA:          1.804 m Patient Age:    4 years      BP:           114/55 mmHg Patient Gender: M             HR:           98 bpm. Exam Location:  [facility] Procedure: 2D Echo and Intracardiac Opacification Agent Indications:     Cardiac Arrest I46.9  History:         Patient has no prior history of Echocardiogram examinations.  Sonographer:     Arville Go RDCS Referring Phys:  [id number] Awilda Bill Diagnosing Phys: Bartholome Bill MD  Sonographer Comments: Echo performed with patient supine and on artificial respirator and Technically difficult study due to poor echo windows. IMPRESSIONS  1. Left ventricular ejection fraction, by estimation, is 25 to 30%. Left ventricular ejection fraction by PLAX is 26 %. The left ventricle has severely decreased function. The left ventricle has no regional wall motion abnormalities. The left ventricular internal cavity size was moderately dilated. Left ventricular diastolic parameters are consistent with Grade I diastolic dysfunction (impaired relaxation).  2. Right ventricular systolic function is normal. The right ventricular size is mildly enlarged.  3. The mitral valve was not well visualized. Trivial mitral valve regurgitation.  4. The aortic valve was not well visualized. Aortic valve regurgitation is trivial. Mild aortic valve sclerosis is present, with no evidence of aortic valve stenosis. FINDINGS  Left Ventricle: Left ventricular ejection fraction, by estimation, is 25 to 30%. Left ventricular ejection fraction by PLAX is 26 %. The left ventricle has severely decreased function. The left ventricle has no regional wall motion abnormalities. Definity contrast agent was given IV to delineate the left ventricular endocardial borders. The left ventricular internal cavity size was moderately dilated. There is no left ventricular hypertrophy. Left ventricular diastolic parameters are consistent with Grade I diastolic dysfunction (impaired relaxation). Right Ventricle: The  right ventricular size is mildly enlarged. No increase in right ventricular wall thickness. Right ventricular systolic function is normal. Left Atrium: Left atrial size was normal in size. Right Atrium: Right atrial size was normal in size. Pericardium: There is no evidence of pericardial effusion. Mitral Valve: The mitral valve was not well visualized. Trivial mitral valve regurgitation. Tricuspid Valve: The tricuspid valve is not well visualized. Tricuspid  valve regurgitation is mild. Aortic Valve: The aortic valve was not well visualized. Aortic valve regurgitation is trivial. Mild aortic valve sclerosis is present, with no evidence of aortic valve stenosis. Aortic valve peak gradient measures 6.4 mmHg. Pulmonic Valve: The pulmonic valve was not well visualized. Pulmonic valve regurgitation is trivial. Aorta: The aortic root was not well visualized. IAS/Shunts: The interatrial septum was not assessed.  LEFT VENTRICLE PLAX 2D LV EF:         Left            Diastology                ventricular     LV e' lateral:   3.05 cm/s                ejection        LV E/e' lateral: 28.8                fraction by     LV e' medial:    5.00 cm/s                PLAX is 26      LV E/e' medial:  17.6                %. LVIDd:         4.98 cm LVIDs:         4.39 cm LV PW:         1.16 cm LV IVS:        1.11 cm LVOT diam:     2.00 cm LV SV:         43 LV SV Index:   24 LVOT Area:     3.14 cm  LV Volumes (MOD) LV vol d, MOD    182.0 ml A2C: LV vol d, MOD    159.0 ml A4C: LV vol s, MOD    136.0 ml A2C: LV vol s, MOD    126.0 ml A4C: LV SV MOD A2C:   46.0 ml LV SV MOD A4C:   159.0 ml LV SV MOD BP:    38.5 ml RIGHT VENTRICLE RV Basal diam:  3.65 cm RV S prime:     14.00 cm/s TAPSE (M-mode): 2.1 cm LEFT ATRIUM              Index       RIGHT ATRIUM           Index LA diam:        3.40 cm  1.88 cm/m  RA Area:     16.40 cm LA Vol (A2C):   119.0 ml 65.96 ml/m RA Volume:   45.90 ml  25.44 ml/m LA Vol (A4C):   83.3 ml  46.17 ml/m LA  Biplane Vol: 99.1 ml  54.93 ml/m  AORTIC VALVE                PULMONIC VALVE AV Area (Vmax): 2.07 cm    PV Vmax:       1.14 m/s AV Vmax:        126.00 cm/s PV Peak grad:  5.2 mmHg AV Peak Grad:   6.4 mmHg LVOT Vmax:      82.90 cm/s LVOT Vmean:     53.500 cm/s LVOT VTI:       0.137 m  AORTA Ao Root diam: 3.60 cm Ao Asc diam:  3.70 cm MITRAL VALVE MV Area (PHT): 4.15 cm     SHUNTS MV  Decel Time: 183 msec     Systemic VTI:  0.14 m MV E velocity: 87.80 cm/s   Systemic Diam: 2.00 cm MV A velocity: 103.00 cm/s MV E/A ratio:  0.85 Bartholome Bill MD Electronically signed by Bartholome Bill MD Signature Date/Time: 11/30/2019/11:41:25 AM    Final     Subjective:   Patient was seen and examined 12/12/2019, 1:47 PM Patient stable today. No acute distress.  No issues overnight Stable for discharge.  Discharge Exam:    Vitals:   12/12/19 1245 12/12/19 1300 12/12/19 1315 12/12/19 1330  BP: (!) 115/57 (!) 117/52 (!) 117/53 (!) 110/54  Pulse: 61 67 64 62  Resp:      Temp:      TempSrc:      SpO2:      Weight:      Height:        General: Pt lying comfortably in bed & appears in no obvious distress. Cardiovascular: S1 & S2 heard, RRR, S1/S2 +. No murmurs, rubs, gallops or clicks. No JVD or pedal edema. Respiratory: Clear to auscultation without wheezing, rhonchi or crackles. No increased work of breathing. Abdominal:  Non-distended, non-tender & soft. No organomegaly or masses appreciated. Normal bowel sounds heard. CNS: Alert and oriented. No focal deficits. Extremities: no edema, no cyanosis    The results of significant diagnostics from this hospitalization (including imaging, microbiology, ancillary and laboratory) are listed below for reference.      Microbiology:   No results found for this or any previous visit (from the past 240 hour(s)).   Labs:   CBC: Recent Labs  Lab 12/06/19 0441 12/06/19 0441 12/07/19 0526 12/07/19 0526 12/07/19 0849 12/08/19 0725 12/09/19 0817  12/09/19 1113 12/10/19 0606  WBC 12.7*   < > 11.4*   < > 11.1* 10.6* RESULTS ENTERED IN ERROR 10.7* 11.3*  NEUTROABS 10.7*  --  9.6*  --   --   --   --   --   --   HGB 10.1*   < > 9.4*   < > 9.6* 9.8* TEST WILL BE CREDITED 10.3* 10.8*  HCT 27.8*   < > 25.9*   < > 26.7* RESULTS UNAVAILABLE DUE TO INTERFERING SUBSTANCE TEST WILL BE CREDITED 27.7* 28.9*  MCV 89.4   < > 88.7   < > 89.6 RESULTS UNAVAILABLE DUE TO INTERFERING SUBSTANCE TEST WILL BE CREDITED 87.7 87.0  PLT 224   < > 221   < > 209 201 TEST WILL BE CREDITED 215 192   < > = values in this interval not displayed.   Basic Metabolic Panel: Recent Labs  Lab 12/06/19 0441 12/06/19 0441 12/07/19 0526 12/07/19 0526 12/08/19 0725 12/09/19 1112 12/10/19 0606 12/11/19 0532 12/12/19 0539  NA 139  --  137  --  136 134* 135  --   --   K 3.0*  --  3.5  --  3.7 3.7 3.7  --   --   CL 93*  --  92*  --  92* 92* 90*  --   --   CO2 30  --  30  --  26 25 24   --   --   GLUCOSE 168*  --  258*  --  180* 226* 156*  --   --   BUN 42*  --  48*  --  61* 68* 77*  --   --   CREATININE 5.23*  --  6.26*  --  7.07* 7.60* 8.61*  --   --  CALCIUM 8.0*  --  8.3*  --  8.5* 8.1* 8.1*  --   --   MG 1.9   < > 1.9   < > 2.1 2.2 2.1 2.0 2.2  PHOS 3.8   < > 4.1   < > 5.1* 5.7* 6.4* 4.3 5.8*   < > = values in this interval not displayed.   Liver Function Tests: No results for input(s): AST, ALT, ALKPHOS, BILITOT, PROT, ALBUMIN in the last 168 hours. BNP (last 3 results) Recent Labs    11/28/19 1331  BNP 4,339.6*   Cardiac Enzymes: No results for input(s): CKTOTAL, CKMB, CKMBINDEX, TROPONINI in the last 168 hours. CBG: Recent Labs  Lab 12/11/19 0810 12/11/19 1135 12/11/19 1719 12/11/19 2109 12/12/19 0746  GLUCAP 136* 176* 181* 116* 138*   Hgb A1c No results for input(s): HGBA1C in the last 72 hours. Lipid Profile No results for input(s): CHOL, HDL, LDLCALC, TRIG, CHOLHDL, LDLDIRECT in the last 72 hours. Thyroid function studies No results for  input(s): TSH, T4TOTAL, T3FREE, THYROIDAB in the last 72 hours.  Invalid input(s): FREET3 Anemia work up No results for input(s): VITAMINB12, FOLATE, FERRITIN, TIBC, IRON, RETICCTPCT in the last 72 hours. Urinalysis No results found for: COLORURINE, APPEARANCEUR, LABSPEC, Harris, GLUCOSEU, HGBUR, BILIRUBINUR, KETONESUR, PROTEINUR, UROBILINOGEN, NITRITE, LEUKOCYTESUR       Time coordinating discharge: Over 55 minutes  SIGNED: Deatra James, MD, FACP, FHM. [facility],  Please use [email or web address] to Page If 7PM-7AM, please contact night-coverage [email or web address] 12/12/2019, 1:47 PM

## 2019-12-12 NOTE — Progress Notes (Signed)
[facility]  ROUNDING NOTE   Subjective:   Doing fair. Denies any acute complaints Wife at bedside States he is able to eat without nausea or vomiting Denies any acute shortness of breath  Objective:  Vital signs in last 24 hours:  Temp:  [98.5 F (36.9 C)-98.7 F (37.1 C)] 98.5 F (36.9 C) (07/08 1045) Pulse Rate:  [58-131] 62 (07/08 1200) Resp:  [6-36] 19 (07/08 0820) BP: (70-127)/(26-84) 113/52 (07/08 1200) SpO2:  [77 %-100 %] 90 % (07/08 1045) FiO2 (%):  [28 %-35 %] 28 % (07/07 2010) Weight:  [72.7 kg] 72.7 kg (07/08 1045)  Weight change: 0.136 kg Filed Weights   12/11/19 0539 12/12/19 0500 12/12/19 1045  Weight: 74.5 kg 72.7 kg 72.7 kg    Intake/Output: I/O last 3 completed shifts: In: 538.3 [P.O.:240; I.V.:166.2; IV Piggyback:132.1] Out: 0    Intake/Output this shift:  Total I/O In: 10 [I.V.:10] Out: -   Physical Exam: General:  No acute distress, laying in the bed  HEENT  anicteric, moist oral mucous membrane, status post laryngectomy,   Pulm/lungs  normal breathing effort,  Tracheostomy, mild  Coarse crackles at bases  CVS/Heart  no rub or gallop, irregular rhythm, atrial fibrillation  Abdomen:   Soft, nontender  Extremities:  + Dependent peripheral edema in thighs and arms  Neurologic:  Alert, able to follow commands  Skin:  No acute rashes   Access: Left arm AVG    Basic Metabolic Panel: Recent Labs  Lab 12/06/19 0441 12/06/19 0441 12/07/19 0526 12/07/19 0526 12/08/19 0725 12/09/19 1112 12/10/19 0606 12/11/19 0532 12/12/19 0539  NA 139  --  137  --  136 134* 135  --   --   K 3.0*  --  3.5  --  3.7 3.7 3.7  --   --   CL 93*  --  92*  --  92* 92* 90*  --   --   CO2 30  --  30  --  _0 --   --   GLUCOSE 168*  --  258*  --  180* 226* 156*  --   --   BUN 42*  --  48*  --  61* 68* 77*  --   --   CREATININE 5.23*  --  6.26*  --  7.07* 7.60* 8.61*  --   --   CALCIUM 8.0*   < > 8.3*   < > 8.5* 8.1* 8.1*  --   --   MG 1.9    < > 1.9   < > 2.1 2.2 2.1 2.0 2.2  PHOS 3.8   < > 4.1   < > 5.1* 5.7* 6.4* 4.3 5.8*   < > = values in this interval not displayed.    Liver Function Tests: No results for input(s): AST, ALT, ALKPHOS, BILITOT, PROT, ALBUMIN in the last 168 hours. No results for input(s): LIPASE, AMYLASE in the last 168 hours. No results for input(s): AMMONIA in the last 168 hours.  CBC: Recent Labs  Lab 12/06/19 0441 12/06/19 0441 12/07/19 0526 12/07/19 0526 12/07/19 0849 12/08/19 0725 12/09/19 0817 12/09/19 1113 12/10/19 0606  WBC 12.7*   < > 11.4*   < > 11.1* 10.6* RESULTS ENTERED IN ERROR 10.7* 11.3*  NEUTROABS 10.7*  --  9.6*  --   --   --   --   --   --   HGB 10.1*   < > 9.4*   < > 9.6* 9.8*  TEST WILL BE CREDITED 10.3* 10.8*  HCT 27.8*   < > 25.9*   < > 26.7* RESULTS UNAVAILABLE DUE TO INTERFERING SUBSTANCE TEST WILL BE CREDITED 27.7* 28.9*  MCV 89.4   < > 88.7   < > 89.6 RESULTS UNAVAILABLE DUE TO INTERFERING SUBSTANCE TEST WILL BE CREDITED 87.7 87.0  PLT 224   < > 221   < > 209 201 TEST WILL BE CREDITED 215 192   < > = values in this interval not displayed.    Cardiac Enzymes: No results for input(s): CKTOTAL, CKMB, CKMBINDEX, TROPONINI in the last 168 hours.  BNP: Invalid input(s): POCBNP  CBG: Recent Labs  Lab 12/11/19 0810 12/11/19 1135 12/11/19 1719 12/11/19 2109 12/12/19 0746  GLUCAP 136* 176* 181* 116* 138*    Microbiology: Results for orders placed or performed during the hospital encounter of 11/28/19  Culture, blood (routine x 2)     Status: None   Collection Time: 11/28/19  1:31 PM   Specimen: BLOOD  Result Value Ref Range Status   Specimen Description BLOOD BLOOD RIGHT ARM  Final   Special Requests   Final    BOTTLES DRAWN AEROBIC AND ANAEROBIC Blood Culture adequate volume   Culture   Final    NO GROWTH 5 DAYS Performed at [facility], [address] [place], [place] [address]    Report Status 12/03/2019 FINAL  Final  Culture, blood (routine  x 2)     Status: None   Collection Time: 11/28/19  2:17 PM   Specimen: BLOOD  Result Value Ref Range Status   Specimen Description BLOOD BLOOD RIGHT WRIST  Final   Special Requests   Final    BOTTLES DRAWN AEROBIC AND ANAEROBIC Blood Culture adequate volume   Culture   Final    NO GROWTH 5 DAYS Performed at [facility], [address], [place], [place] [address]    Report Status 12/03/2019 FINAL  Final  SARS Coronavirus 2 by RT PCR (hospital order, performed in [facility] hospital lab) Nasopharyngeal Nasopharyngeal Swab     Status: None   Collection Time: 11/28/19  4:40 PM   Specimen: Nasopharyngeal Swab  Result Value Ref Range Status   SARS Coronavirus 2 NEGATIVE NEGATIVE Final    Comment: (NOTE) SARS-CoV-2 target nucleic acids are NOT DETECTED.  The SARS-CoV-2 RNA is generally detectable in upper and lower respiratory specimens during the acute phase of infection. The lowest concentration of SARS-CoV-2 viral copies this assay can detect is 250 copies / mL. A negative result does not preclude SARS-CoV-2 infection and should not be used as the sole basis for treatment or other patient management decisions.  A negative result may occur with improper specimen collection / handling, submission of specimen other than nasopharyngeal swab, presence of viral mutation(s) within the areas targeted by this assay, and inadequate number of viral copies (<250 copies / mL). A negative result must be combined with clinical observations, patient history, and epidemiological information.  Fact Sheet for Patients:   StrictlyIdeas.no  Fact Sheet for Healthcare Providers: BankingDealers.co.za  This test is not yet approved or  cleared by the [place] FDA and has been authorized for detection and/or diagnosis of SARS-CoV-2 by FDA under an Emergency Use Authorization (EUA).  This EUA will remain in effect (meaning this test can be  used) for the duration of the COVID-19 declaration under Section 564(b)(1) of the Act, 21 U.S.C. section 360bbb-3(b)(1), unless the authorization is terminated or revoked sooner.  Performed at [facility], [address] [place], [place] [address]   MRSA PCR Screening     Status: None   Collection Time: 11/28/19  9:08 PM   Specimen: Nasopharyngeal  Result Value Ref Range Status   MRSA by PCR NEGATIVE NEGATIVE Final    Comment:        The GeneXpert MRSA Assay (FDA approved for NASAL specimens only), is one component of a comprehensive MRSA colonization surveillance program. It is not intended to diagnose MRSA infection nor to guide or monitor treatment for MRSA infections. Performed at [facility], [address] [place],  [address]     Coagulation Studies: Recent Labs    12/10/19 2109/08/11  LABPROT 13.7  INR 1.1    Urinalysis: No results for input(s): COLORURINE, LABSPEC, PHURINE, GLUCOSEU, HGBUR, BILIRUBINUR, KETONESUR, PROTEINUR, UROBILINOGEN, NITRITE, LEUKOCYTESUR in the last 72 hours.  Invalid input(s): APPERANCEUR    Imaging: US Venous Img Upper Uni Right(DVT)  Result Date: 12/10/2019 CLINICAL DATA:  Right upper extremity pain and. History of end-stage renal disease. Former smoker. Evaluate for DVT. EXAM: RIGHT UPPER EXTREMITY VENOUS DOPPLER ULTRASOUND TECHNIQUE: Gray-scale sonography with graded compression, as well as color Doppler and duplex ultrasound were performed to evaluate the upper extremity deep venous system from the level of the subclavian vein and including the jugular, axillary, basilic, radial, ulnar and upper cephalic vein. Spectral Doppler was utilized to evaluate flow at rest and with distal augmentation maneuvers. COMPARISON:  Chest radiograph-01/01/2020 FINDINGS: Contralateral Subclavian Vein: Respiratory phasicity is normal and symmetric with the symptomatic side. No evidence of thrombus. Normal compressibility. Internal  Jugular Vein: There is mixed echogenic occlusive thrombus within the right internal jugular vein (images 2 through 5). Subclavian Vein: No evidence of thrombus. Normal compressibility, respiratory phasicity and response to augmentation. Axillary Vein: No evidence of thrombus. Normal compressibility, respiratory phasicity and response to augmentation. Cephalic Vein: There is mixed echogenic occlusive thrombus involving the right cephalic vein extending from the level of the distal forearm (image 29), through the antecubital fossa (image 30), to the proximal humerus (image 31). Note is made of echogenic calcifications involving the thrombus within the mid aspect of the cephalic vein (image 32). Basilic Vein: No evidence of thrombus. Normal compressibility, respiratory phasicity and response to augmentation. Brachial Veins: No evidence of thrombus. Normal compressibility, respiratory phasicity and response to augmentation. Radial Veins: No evidence of thrombus. Normal compressibility, respiratory phasicity and response to augmentation. Ulnar Veins: No evidence of thrombus. Normal compressibility, respiratory phasicity and response to augmentation. Other Findings:  None visualized. IMPRESSION: 1. The examination is positive for age-indeterminate occlusive DVT within the right internal jugular vein. Given history of end-stage renal disease, this could represent the sequela of previous dialysis catheter placement. Clinical correlation is advised. 2. The examination is positive for occlusive SVT involving the right cephalic vein, age indeterminate though given suspected calcifications, is favored to be chronic in etiology. Electronically Signed   By: Sandi Mariscal M.D.   On: 12/10/2019 14:29     Medications:   . sodium chloride Stopped (12/06/19 1834)  . sodium chloride Stopped (11/28/19 Aug 12, 2019)  . sodium chloride    . sodium chloride     . sodium chloride   Intravenous Once  . amiodarone  100 mg Oral Daily  .  apixaban  10 mg Oral BID   Followed by  . [START ON 12/18/2019] apixaban  5 mg Oral BID  . chlorhexidine gluconate (MEDLINE KIT)  15 mL Mouth Rinse BID  .  Chlorhexidine Gluconate Cloth  6 each Topical Q0600  . epoetin (EPOGEN/PROCRIT) injection  2,000 Units Intravenous Q T,Th,Sa-HD  . famotidine  20 mg Oral Daily  . feeding supplement (NEPRO CARB STEADY)  237 mL Oral BID BM  . insulin aspart  0-5 Units Subcutaneous QHS  . insulin aspart  0-6 Units Subcutaneous TID WC  . midodrine  10 mg Oral TID WC  . multivitamin  1 tablet Oral QHS  . sodium chloride flush  10-40 mL Intracatheter Q12H   sodium chloride, sodium chloride, alteplase, bisacodyl, heparin, HYDROmorphone (DILAUDID) injection, lidocaine (PF), lidocaine-prilocaine, ondansetron (ZOFRAN) IV, pentafluoroprop-tetrafluoroeth, polyethylene glycol, sodium chloride flush  Assessment/ Plan:  [name] is a 86 y.o. 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 End-stage renal disease with volume overload:   2 L  removed with HD on Saturday;  2 L on Monday, 1 L on Tuesday. (net -5 L) Midodrine and IV albumin supplementation for hypotension as needed Hemodialysis planned for today.  Volume removal as tolerated  2. Hypokalemia:  Potassium in normal range today  3.  Atrial fibrillation  Management as per cardiology Rate control with amiodarone.  4. Anemia with chronic kidney disease: receives Mircera as outpatient.  PRBC transfusion 6/29.  - EPO with scheduled HD treatment.  Lab Results   Component Value Date   HGB 10.8 (L) 12/10/2019      LOS: 14 Tavaris Eudy 7/8/202112:08 PM

## 2019-12-12 NOTE — TOC Transition Note (Signed)
Transition of Care [facility]) - CM/SW Discharge Note   Patient Details  Name: Rohin Krejci MRN: [id number] Date of Birth: [birth year]  Transition of Care [facility]) CM/SW Contact:  Victorino Dike, RN Phone Number: 12/12/2019, 3:26 PM       Final next level of care: [place] Barriers to Discharge: Barriers Resolved   Patient Goals and CMS Choice   CMS Medicare.gov Compare Post Acute Care list provided to:: Patient Choice offered to / list presented to : Spouse, Patient  Discharge Placement              Patient chooses bed at: [facility] Patient to be transferred to facility by: [facility] Name of family member notified: Olivia Patient and family notified of of transfer: 12/12/19  Discharge Plan and Services                                     Social Determinants of Health (SDOH) Interventions     Readmission Risk Interventions No flowsheet data found.

## 2019-12-13 DIAGNOSIS — R079 Chest pain, unspecified: Secondary | ICD-10-CM

## 2019-12-13 LAB — TROPONIN I (HIGH SENSITIVITY)
Troponin I (High Sensitivity): 46 ng/L — ABNORMAL HIGH (ref <18)
Troponin I (High Sensitivity): 45 ng/L — ABNORMAL HIGH (ref <18)
Troponin I (High Sensitivity): 43 ng/L — ABNORMAL HIGH (ref <18)

## 2019-12-13 LAB — GLUCOSE, CAPILLARY
Glucose-Capillary: 155 mg/dL — ABNORMAL HIGH (ref 70–99)
Glucose-Capillary: 156 mg/dL — ABNORMAL HIGH (ref 70–99)
Glucose-Capillary: 164 mg/dL — ABNORMAL HIGH (ref 70–99)
Glucose-Capillary: 159 mg/dL — ABNORMAL HIGH (ref 70–99)

## 2019-12-13 LAB — PHOSPHORUS: Phosphorus: 5.2 mg/dL — ABNORMAL HIGH (ref 2.5–4.6)

## 2019-12-13 LAB — MAGNESIUM: Magnesium: 2.1 mg/dL (ref 1.7–2.4)

## 2019-12-13 NOTE — Progress Notes (Signed)
MD notified via secure chat with troponin level of 46. RN will await any new orders. I will continue to assess.

## 2019-12-13 NOTE — Progress Notes (Signed)
[facility]  ROUNDING NOTE   Subjective:   Doing fair. Denies any acute complaints Sitting up in recliner chair Denies any acute shortness of breath, nausea or vomiting   Objective:  Vital signs in last 24 hours:  Temp:  [97.5 F (36.4 C)-98.5 F (36.9 C)] 97.8 F (36.6 C) (07/09 1137) Pulse Rate:  [58-68] 63 (07/09 1137) Resp:  [15-34] 15 (07/08 2000) BP: (102-134)/(32-71) 116/58 (07/09 1137) SpO2:  [87 %-98 %] 98 % (07/09 1137) FiO2 (%):  [28 %-40 %] 40 % (07/09 0732) Weight:  [70.3 kg] 70.3 kg (07/09 0554)  Weight change: -0 kg Filed Weights   12/12/19 0500 12/12/19 1045 12/13/19 0554  Weight: 72.7 kg 72.7 kg 70.3 kg    Intake/Output: I/O last 3 completed shifts: In: 130 [P.O.:120; I.V.:10] Out: 0    Intake/Output this shift:  Total I/O In: 120 [P.O.:120] Out: -   Physical Exam: General:  No acute distress,   HEENT  anicteric, moist oral mucous membrane, status post laryngectomy,   Pulm/lungs  normal breathing effort,  Tracheostomy, mild  Coarse crackles at bases  CVS/Heart  no rub or gallop, irregular rhythm, atrial fibrillation, bradycardia  Abdomen:   Soft, nontender  Extremities:  + Dependent peripheral edema in thighs and arms  Neurologic:  Alert, able to follow commands  Skin:  No acute rashes   Access: Left arm AVG    Basic Metabolic Panel: Recent Labs  Lab 12/07/19 0526 12/07/19 0526 12/08/19 0725 12/08/19 0725 12/09/19 1112 12/10/19 0606 12/11/19 0532 12/12/19 0539 12/13/19 0756  NA 137  --  136  --  134* 135  --   --   --   K 3.5  --  3.7  --  3.7 3.7  --   --   --   CL 92*  --  92*  --  92* 90*  --   --   --   CO2 30  --  26  --  25 24  --   --   --   GLUCOSE 258*  --  180*  --  226* 156*  --   --   --   BUN 48*  --  61*  --  68* 77*  --   --   --   CREATININE 6.26*  --  7.07*  --  7.60* 8.61*  --   --   --   CALCIUM 8.3*   < > 8.5*  --  8.1* 8.1*  --   --   --   MG 1.9   < > 2.1   < > 2.2 2.1 2.0 2.2 2.1  PHOS 4.1    < > 5.1*   < > 5.7* 6.4* 4.3 5.8* 5.2*   < > = values in this interval not displayed.    Liver Function Tests: No results for input(s): AST, ALT, ALKPHOS, BILITOT, PROT, ALBUMIN in the last 168 hours. No results for input(s): LIPASE, AMYLASE in the last 168 hours. No results for input(s): AMMONIA in the last 168 hours.  CBC: Recent Labs  Lab 12/07/19 0526 12/07/19 0526 12/07/19 0849 12/07/19 0849 12/08/19 0725 12/09/19 0817 12/09/19 1113 12/10/19 0606 12/12/19 2106  WBC 11.4*   < > 11.1*  --  10.6* RESULTS ENTERED IN ERROR 10.7* 11.3*  --   NEUTROABS 9.6*  --   --   --   --   --   --   --   --   HGB 9.4*   < >  9.6*   < > 9.8* TEST WILL BE CREDITED 10.3* 10.8* 10.1*  HCT 25.9*   < > 26.7*   < > RESULTS UNAVAILABLE DUE TO INTERFERING SUBSTANCE TEST WILL BE CREDITED 27.7* 28.9* 28.1*  MCV 88.7   < > 89.6  --  RESULTS UNAVAILABLE DUE TO INTERFERING SUBSTANCE TEST WILL BE CREDITED 87.7 87.0  --   PLT 221   < > 209  --  201 TEST WILL BE CREDITED 215 192  --    < > = values in this interval not displayed.    Cardiac Enzymes: No results for input(s): CKTOTAL, CKMB, CKMBINDEX, TROPONINI in the last 168 hours.  BNP: Invalid input(s): POCBNP  CBG: Recent Labs  Lab 12/12/19 0746 12/12/19 1640 12/12/19 2053 12/13/19 0730 12/13/19 1135  GLUCAP 138* 100* 168* 155* 156*    Microbiology: Results for orders placed or performed during the hospital encounter of 11/28/19  Culture, blood (routine x 2)     Status: None   Collection Time: 11/28/19  1:31 PM   Specimen: BLOOD  Result Value Ref Range Status   Specimen Description BLOOD BLOOD RIGHT ARM  Final   Special Requests   Final    BOTTLES DRAWN AEROBIC AND ANAEROBIC Blood Culture adequate volume   Culture   Final    NO GROWTH 5 DAYS Performed at [facility], [address], [place], [place] [address]    Report Status 12/03/2019 FINAL  Final  Culture, blood (routine x 2)     Status: None   Collection Time:  11/28/19  2:17 PM   Specimen: BLOOD  Result Value Ref Range Status   Specimen Description BLOOD BLOOD RIGHT WRIST  Final   Special Requests   Final    BOTTLES DRAWN AEROBIC AND ANAEROBIC Blood Culture adequate volume   Culture   Final    NO GROWTH 5 DAYS Performed at [facility], [address] [place],  [address]    Report Status 12/03/2019 FINAL  Final  SARS Coronavirus 2 by RT PCR (hospital order, performed in [facility] hospital lab) Nasopharyngeal Nasopharyngeal Swab     Status: None   Collection Time: 11/28/19  4:40 PM   Specimen: Nasopharyngeal Swab  Result Value Ref Range Status   SARS Coronavirus 2 NEGATIVE NEGATIVE Final    Comment: (NOTE) SARS-CoV-2 target nucleic acids are NOT DETECTED.  The SARS-CoV-2 RNA is generally detectable in upper and lower respiratory specimens during the acute phase of infection. The lowest concentration of SARS-CoV-2 viral copies this assay can detect is 250 copies / mL. A negative result does not preclude SARS-CoV-2 infection and should not be used as the sole basis for treatment or other patient management decisions.  A negative result may occur with improper specimen collection / handling, submission of specimen other than nasopharyngeal swab, presence of viral mutation(s) within the areas targeted by this assay, and inadequate number of viral copies (<250 copies / mL). A negative result must be combined with clinical observations, patient history, and epidemiological information.  Fact Sheet for Patients:   StrictlyIdeas.no  Fact Sheet for Healthcare Providers: BankingDealers.co.za  This test is not yet approved or  cleared by the [place] FDA and has been authorized for detection and/or diagnosis of SARS-CoV-2 by FDA under an Emergency Use Authorization (EUA).  This EUA will remain in effect (meaning this test can be used) for the duration of the COVID-19  declaration under Section 564(b)(1) of the Act, 21 U.S.C. section 360bbb-3(b)(1),  unless the authorization is terminated or revoked sooner.  Performed at [facility], [address] [place], [place] [address]   MRSA PCR Screening     Status: None   Collection Time: 11/28/19  9:08 PM   Specimen: Nasopharyngeal  Result Value Ref Range Status   MRSA by PCR NEGATIVE NEGATIVE Final    Comment:        The GeneXpert MRSA Assay (FDA approved for NASAL specimens only), is one component of a comprehensive MRSA colonization surveillance program. It is not intended to diagnose MRSA infection nor to guide or monitor treatment for MRSA infections. Performed at [facility], [address] [place], [place] [address]     Coagulation Studies: Recent Labs    12/10/19 August 12, 2109  LABPROT 13.7  INR 1.1    Urinalysis: No results for input(s): COLORURINE, LABSPEC, PHURINE, GLUCOSEU, HGBUR, BILIRUBINUR, KETONESUR, PROTEINUR, UROBILINOGEN, NITRITE, LEUKOCYTESUR in the last 72 hours.  Invalid input(s): APPERANCEUR    Imaging: No results found.   Medications:   . sodium chloride Stopped (12/06/19 1834)  . sodium chloride Stopped (11/28/19 August 13, 2019)  . sodium chloride    . sodium chloride     . sodium chloride   Intravenous Once  . amiodarone  100 mg Oral Daily  . apixaban  10 mg Oral BID   Followed by  . [START ON 12/18/2019] apixaban  5 mg Oral BID  . chlorhexidine gluconate (MEDLINE KIT)  15 mL Mouth Rinse BID  . Chlorhexidine Gluconate Cloth  6 each Topical Q0600  . epoetin (EPOGEN/PROCRIT) injection  2,000 Units Intravenous Q T,Th,Sa-HD  . famotidine  20 mg Oral Daily  . feeding supplement (NEPRO CARB STEADY)  237 mL Oral BID BM  . insulin aspart  0-5 Units Subcutaneous QHS  . insulin aspart  0-6 Units Subcutaneous TID WC  . midodrine  10 mg Oral TID WC  . multivitamin  1 tablet Oral QHS  . sodium chloride flush  10-40 mL Intracatheter Q12H   sodium chloride,  sodium chloride, alteplase, bisacodyl, heparin, HYDROmorphone (DILAUDID) injection, lidocaine (PF), lidocaine-prilocaine, ondansetron (ZOFRAN) IV, pentafluoroprop-tetrafluoroeth, polyethylene glycol, sodium chloride flush  Assessment/ Plan:  Mr. Gavin Telford is a 86 y.o. 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 End-stage renal disease with volume overload:   2 L  removed with HD on 08-12-22;  2 L on Monday, 1 L on Tuesday. (net -5 L) Midodrine and IV albumin supplementation for hypotension as needed Next hemodialysis planned for 12-Aug-2022.  Volume removal as tolerated Preferably to be dialyzed in chair tomorrow if still in hospital  2. Hypokalemia:  Potassium in normal range today  3.  Atrial fibrillation  Management as per cardiology Rate control with amiodarone.  4. Anemia with chronic kidney disease: receives Mircera as outpatient.  PRBC transfusion 6/29.  - EPO with scheduled HD treatment.  Lab Results  Component Value Date   HGB 10.1 (L) 12/12/2019      LOS: 15 Kristen Fromm 7/9/202112:56 PM

## 2019-12-13 NOTE — Care Management Important Message (Signed)
Important Message  Patient Details  Name: Kerry [name] MRN: [id number] Date of Birth: July 08, 1933   Medicare Important Message Given:  Yes     Dannette Barbara 12/13/2019, 11:56 AM

## 2019-12-13 NOTE — Progress Notes (Signed)
[facility] Cardiology    SUBJECTIVE: Resting comfortably denies any significant complaints not verbal but communicates that he feels reasonably well Case discussed with his wife concerned about his strength I would like to see him up in the bed and consider physical therapy prior to discharge   Vitals:   12/12/19 2025 12/13/19 0554 12/13/19 0730 12/13/19 0732  BP: (!) 112/39 (!) 127/54 (!) 118/59   Pulse: 68 (!) 58 (!) 58   Resp:      Temp: 98.2 F (36.8 C) 98.5 F (36.9 C) 97.6 F (36.4 C)   TempSrc: Oral Oral Oral   SpO2: 97% 97% 98% 93%  Weight:  70.3 kg    Height:         Intake/Output Summary (Last 24 hours) at 12/13/2019 6599 Last data filed at 12/13/2019 0600 Gross per 24 hour  Intake 130 ml  Output 0 ml  Net 130 ml      PHYSICAL EXAM  General: Well developed, well nourished, in no acute distress HEENT:  Normocephalic and atramatic permanent tracheostomy Neck:  No JVD.  Lungs: Clear bilaterally to auscultation and percussion. Heart: HRRR . Normal S1 and S2 without gallops or murmurs.  Abdomen: Bowel sounds are positive, abdomen soft and non-tender  Msk:  Back normal, normal gait. Normal strength and tone for age. Extremities: No clubbing, cyanosis or edema.   Neuro: Alert and oriented X 3. Psych:  Good affect, responds appropriately   LABS: Basic Metabolic Panel: Recent Labs    12/11/19 0532 12/12/19 0539  MG 2.0 2.2  PHOS 4.3 5.8*   Liver Function Tests: No results for input(s): AST, ALT, ALKPHOS, BILITOT, PROT, ALBUMIN in the last 72 hours. No results for input(s): LIPASE, AMYLASE in the last 72 hours. CBC: Recent Labs    12/12/19 2106  HGB 10.1*  HCT 28.1*   Cardiac Enzymes: No results for input(s): CKTOTAL, CKMB, CKMBINDEX, TROPONINI in the last 72 hours. BNP: Invalid input(s): POCBNP D-Dimer: No results for input(s): DDIMER in the last 72 hours. Hemoglobin A1C: No results for input(s): HGBA1C in the last 72 hours. Fasting Lipid Panel: No  results for input(s): CHOL, HDL, LDLCALC, TRIG, CHOLHDL, LDLDIRECT in the last 72 hours. Thyroid Function Tests: No results for input(s): TSH, T4TOTAL, T3FREE, THYROIDAB in the last 72 hours.  Invalid input(s): FREET3 Anemia Panel: No results for input(s): VITAMINB12, FOLATE, FERRITIN, TIBC, IRON, RETICCTPCT in the last 72 hours.  No results found.   Echo moderate release left ventricular function around 30 to 35%  TELEMETRY: Sinus bradycardia:  ASSESSMENT AND PLAN:  Active Problems:   Anemia of chronic disease   CKD (chronic kidney disease) stage 4, GFR 15-29 ml/min (HCC)   Coronary artery disease   Heart failure with preserved ejection fraction (HCC)   History of tracheostomy   Hypertension   Type 2 diabetes mellitus ([place])   Cardiac arrest ([place])    Plan Agree with increase activity up out of bed to chair Consider physical therapy to help with strength training movement balance and possibly gait Continue dialysis management and control Tuesday Thursday Saturday Continue low-dose amiodarone therapy for atrial fibrillation Would recommend long-term anticoagulation with evidence of atrial fibrillation to reduce his risk for stroke Continue hypertension management Hypotension has since improved while on midodrine which has since been discontinued Patient currently appears to be euvolemic continue volume status through dialysis as per nephrology Continue diabetes management and control Have the patient follow-up with cardiology 1 to 2 weeks for evaluation of cardiomyopathy atrial  fibrillation and arrhythmia consider AICD We will sign off the case now reconsult cardiology as necessary    Yolonda Kida, MD 12/13/2019 8:24 AM

## 2019-12-13 NOTE — Discharge Summary (Signed)
Physician Discharge Summary [facility]    Patient: Kerry [name]                   Admit date: 11/28/2019   DOB: 1934/04/07             Discharge date:12/13/2019/10:57 AM YKD:[id number]                          PCP: Cecile Sheerer, MD  Disposition: SNF   Recommendations for Outpatient Follow-up:   . Follow up: in 1 week  . Continue with hemodialysis as scheduled . Follow with nephrology . Palliative care consult  Discharge Condition: Stable   Code Status:   Code Status: Full Code  Diet recommendation: Regular healthy diet/   The patient was seen and examined this morning, stable. The reason the patient did not get transferred yesterday as patient was not evaluated as he could tolerate sitting in chair for hemodialysis. PT  and nursing staff to evaluate the patient today to see if he could tolerate sitting up in chair for dialysis. Also we will wean him down to 4 L of oxygen with a goal O2 sat 88-92%.  Plan of care discussed with the patient and his wife.  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    Apparently patient lost pulse while having his the most recent dialysis session. Patient had also developed A. fib RVR and was apneic in the field and required bag mask ventilation. After arrival to the ER patient again coded x2 with V. fib arrest status post ACLS and ROSC, found to be amiodarone and 20 MCG Levophed.Ralls bedside. He underwent ACLS x 3. He was placed on the arctic sun protocol.  The patient required mechanical ventilation via tracheostomy. He was placed on an amiodarone gtt. Triple lumen catheter was placed. TTE was performed and demonstrated EF 25-30%. 26% LVEF by PLAX. Grade 1 diastolic dysfunction. RV is mildly enlarged.   The patient remains on low dose amiodarone gtt. Pressors off this morning per nursing. Blood pressure have been stable since yesterday afternoon.   Subjective: The patient was seen and examined this morning, wife present at bedside.  On 5 L of oxygen, via tracheostomy .   Awake alert following command, right arm swelling .  Per wife no change from yesterday  active Problems: No acute distress, currently stable    Cardiac arrest ([place]) Acute on chronic respiratory failure History of laryngeal carcinoma History of laryngectomy     LOS: 13 days   A & P   Severe ACUTE Hypoxic and Hypercapnic Respiratory Failure:  Patient is currently stable, chronic tracheostomy,  currently on 2-4 L of oxygen via trach site neck Satting >95% With a history of laryngectomy   Laryngeal Carcinoma:  Stable. Chronic tracheostomy -PCCM, RT following, continue as needed nebs   Acute Systolic Cardiac Failure:  Remained stable on  oxygen, satting greater than 92% EF 20%. Complicated by Atrial fibrillation with RVR and ischemic cardiomyopathy.  SYSTOLIC CARDIAC FAILURE- EF20%. The patient continues to require IV amiodarone as he is still unable to take PO. No anticoagulation due to bleeding history and anemia. Cardiology does not feel that  arrest was a primary ischemic event. Echo did not reveal changes in wall motion or EF.   Hypotension:  -BP stabilized improved -Home BP meds modified, DC Pt is off of pressors.  ESRD on HD:  -Nephrology following closely -Hemodialysis per nephrology -Medications are dosed renal    Cardiogenic Shock:  -Status post ICD management, patient has been stabilized off pressors -BP stabilized We will continue to monitor closely   Altered Mental Status:  Improved, at baseline now  Right arm edema, -Likely venous stasis -Ultrasound negative for DVT  Right IJ DVT, right cephalic vein SVT -Patient has been on heparin, will switch to Eliquis    DVT Prophylaxis: SCD's CODE STATUS: Full Code --- we recommend to continue palliative care follow-up for determining goals of care, CODE STATUS near future. Family Communication: Wife at bedside discussed in detail regarding goals of care, and palliative follow-up Disposition: From Home. Anticipate discharge to SNF/Rehab.   Dispo: To SNF Continue hemodialysis as scheduled -last hemodialysis today 12/12/2019 (Tuesday Thursday Saturday) Continue trach hygiene, with as needed suction Continue O2 via trach Continue palliative care discussion   Venous ultrasound  IMPRESSION: 1. The examination is positive for age-indeterminate occlusive DVT within the right internal jugular vein. Given history of end-stage renal disease, this could represent the sequela of previous dialysis catheter placement. Clinical correlation is advised. 2. The examination is positive for occlusive SVT involving the right cephalic vein, age indeterminate though given suspected calcifications, is favored to be chronic in etiology.    Consultants   Cardiology  Nephrology  PCCM  Procedures   Dialysis  Mechanical ventilation        Nutritional status:  Nutrition Problem: Increased nutrient needs Etiology: catabolic illness (CHF,  COPD, ESRD on HD) Signs/Symptoms: estimated needs Interventions: Nepro shake, MVI   Discharge Instructions:   Discharge Instructions    Activity as tolerated - No restrictions   Complete by: As directed    Activity as tolerated - No restrictions   Complete by: As directed    Call MD for:  difficulty breathing, headache or visual disturbances   Complete by: As directed    Call MD for:  persistant nausea and vomiting   Complete by: As directed    Call MD for:  temperature >100.4   Complete by: As directed    Diet - low sodium heart healthy   Complete by: As directed    Discharge instructions   Complete by: As directed    Follow-up at nursing facility, continue trach hygiene... With suction. Continue aggressive PT OT. Fall precaution May taper down O2 demand, keep O2 sat greater than 92%.   Discharge instructions   Complete by: As directed    Continue palliative discussion Continue modalities as scheduled   Increase activity slowly   Complete by: As directed    Increase activity slowly   Complete by: As directed    Remove dressing in 24 hours   Complete by: As  directed    Remove dressing in 48 hours   Complete by: As directed    Remove dressing in 48 hours   Complete by: As directed        Medication List    STOP taking these medications   amLODipine 2.5 MG tablet Commonly known as: NORVASC   aspirin 81 MG chewable tablet   clopidogrel 75 MG tablet Commonly known as: PLAVIX   hydrALAZINE 25 MG tablet Commonly known as: APRESOLINE   isosorbide dinitrate 20 MG tablet Commonly known as: ISORDIL   mirtazapine 7.5 MG tablet Commonly known as: REMERON   nitroGLYCERIN 0.3 MG SL tablet Commonly known as: NITROSTAT   pantoprazole 40 MG tablet Commonly known as: PROTONIX   torsemide 20 MG tablet Commonly known as: DEMADEX   Triphrocaps 1 MG Caps Replaced by: multivitamin Tabs tablet   Vitamin D3 25 MCG (1000 UT) Caps     TAKE these medications    alteplase 2 MG injection Commonly known as: CATHFLO ACTIVASE 2 mg by Intracatheter route once as needed for open catheter.   amiodarone 100 MG tablet Commonly known as: PACERONE Take 1 tablet (100 mg total) by mouth daily.   apixaban 5 MG Tabs tablet Commonly known as: ELIQUIS Take 2 tablets (10 mg total) by mouth 2 (two) times daily for 6 days.   apixaban 5 MG Tabs tablet Commonly known as: ELIQUIS Take 1 tablet (5 mg total) by mouth 2 (two) times daily. Start taking on: December 18, 2019   atorvastatin 80 MG tablet Commonly known as: LIPITOR Take 80 mg by mouth daily.   chlorhexidine gluconate (MEDLINE KIT) 0.12 % solution Commonly known as: PERIDEX 15 mLs by Mouth Rinse route 2 (two) times daily.   Chlorhexidine Gluconate Cloth 2 % Pads Apply 6 each topically daily at 6 (six) AM.   epoetin alfa 2000 UNIT/ML injection Commonly known as: EPOGEN Inject 1 mL (2,000 Units total) into the vein Every Tuesday,Thursday,and Saturday with dialysis.   feeding supplement (NEPRO CARB STEADY) Liqd Take 237 mLs by mouth 2 (two) times daily between meals for 10 days.   ferrous sulfate 325 (65 FE) MG EC tablet Take 325 mg by mouth daily with breakfast.   glipiZIDE XL 10 MG 24 hr tablet Generic drug: glipiZIDE Take 10 mg by mouth daily with breakfast.   Glucagon Emergency 1 MG Kit as needed. Reported on 06/02/2015   insulin aspart 100 UNIT/ML injection Commonly known as: novoLOG Inject 0-5 Units into the skin at bedtime.   lidocaine-prilocaine cream Commonly known as: EMLA Apply 1 application topically as needed (topical anesthesia for hemodialysis if Gebauers and Lidocaine injection are ineffective.).   metoprolol succinate 25 MG 24 hr tablet Commonly known as: TOPROL-XL Take 0.5 tablets (12.5 mg total) by mouth daily. What changed: how much to take   midodrine 10 MG tablet Commonly known as: PROAMATINE Take 1 tablet (10 mg total) by mouth 3 (three) times daily with  meals.   multivitamin Tabs tablet Take 1 tablet by mouth at bedtime. Replaces: Triphrocaps 1 MG Caps   pentafluoroprop-tetrafluoroeth Aero Commonly known as: GEBAUERS Apply 1 application topically as needed (topical anesthesia for hemodialysis).   polyethylene glycol 17 g packet Commonly known as: MIRALAX / GLYCOLAX Place 17 g into feeding tube daily as needed for moderate constipation.   Precision QID Test test strip Generic drug: glucose blood Accu check Aviva strips, use twice daily   sevelamer carbonate 800 MG tablet Commonly known as: RENVELA Take 800-1,600 mg  by mouth in the morning, at noon, in the evening, and at bedtime.       Contact information for after-discharge care    Destination    [facility] Preferred SNF .   Service: Skilled Nursing Contact information: [place] [phone]                 Allergies  Allergen Reactions  . Ace Inhibitors Other (See Comments)    Potassium elevated  . Gabapentin Other (See Comments)    Ataxia at 100 mg.  . Losartan Other (See Comments)    Hyperkalemia     Procedures /Studies:   CT HEAD WO CONTRAST  Result Date: 11/28/2019 CLINICAL DATA:  Status post cardiac arrest. EXAM: CT HEAD WITHOUT CONTRAST TECHNIQUE: Contiguous axial images were obtained from the base of the skull through the vertex without intravenous contrast. COMPARISON:  None. FINDINGS: Brain: There is mild cerebral atrophy with widening of the extra-axial spaces and ventricular dilatation. There are areas of decreased attenuation within the white matter tracts of the supratentorial brain, consistent with microvascular disease changes. Vascular: No hyperdense vessel or unexpected calcification. Skull: Normal. Negative for fracture or focal lesion. Sinuses/Orbits: No acute finding. Other: None. IMPRESSION: 1. Generalized cerebral atrophy. 2. No acute intracranial abnormality. Electronically Signed    By: Virgina Norfolk M.D.   On: 11/28/2019 18:06   CT ABDOMEN PELVIS W CONTRAST  Result Date: 12/06/2019 CLINICAL DATA:  Abdominal distention EXAM: CT ABDOMEN AND PELVIS WITH CONTRAST TECHNIQUE: Multidetector CT imaging of the abdomen and pelvis was performed using the standard protocol following bolus administration of intravenous contrast. CONTRAST:  130m OMNIPAQUE IOHEXOL 300 MG/ML  SOLN COMPARISON:  None. FINDINGS: Lower chest: Moderate bilateral pleural effusions with compressive atelectasis in the lower lobes. Densely calcified visualized coronary arteries. Cardiomegaly. Hepatobiliary: Subtle layering high-density material within the gallbladder could reflect small stones or sludge/gravel. No focal hepatic abnormality. Pancreas: No focal abnormality or ductal dilatation. Spleen: No focal abnormality.  Normal size. Adrenals/Urinary Tract: Kidneys are small bilaterally. Reflux of contrast from the right heart through the IVC into the right renal veins suggests right heart dysfunction. No hydronephrosis. No renal or adrenal mass. Urinary bladder decompressed. Stomach/Bowel: Colonic diverticulosis. No active diverticulitis. Stomach and small bowel decompressed, unremarkable. Moderate stool burden in the colon. Vascular/Lymphatic: Aortic atherosclerosis. No enlarged abdominal or pelvic lymph nodes. Reproductive: Mildly prominent prostate. Other: Small to moderate free fluid in the abdomen and pelvis. Extensive subcutaneous edema throughout the subcutaneous soft tissues suggesting anasarca. Musculoskeletal: No acute bony abnormality. IMPRESSION: Moderate bilateral pleural effusions with compressive atelectasis in the lower lobes. Small kidneys bilaterally with poor excretion of contrast suggesting kidney disease. Aortic atherosclerosis. Small to moderate free fluid in the abdomen and pelvis. Diffuse anasarca like fluid throughout the subcutaneous soft tissues. Moderate stool burden in the colon.  Left colonic  diverticulosis. Reflux of contrast from the right heart through the IVC into the hepatic veins and right renal vein suggesting right heart dysfunction. Electronically Signed   By: KRolm BaptiseM.D.   On: 12/06/2019 18:45   UKoreaVenous Img Upper Uni Right(DVT)  Result Date: 12/10/2019 CLINICAL DATA:  Right upper extremity pain and. History of end-stage renal disease. Former smoker. Evaluate for DVT. EXAM: RIGHT UPPER EXTREMITY VENOUS DOPPLER ULTRASOUND TECHNIQUE: Gray-scale sonography with graded compression, as well as color Doppler and duplex ultrasound were performed to evaluate the upper extremity deep venous system from the level of the subclavian vein and  including the jugular, axillary, basilic, radial, ulnar and upper cephalic vein. Spectral Doppler was utilized to evaluate flow at rest and with distal augmentation maneuvers. COMPARISON:  Chest radiograph-01/01/2020 FINDINGS: Contralateral Subclavian Vein: Respiratory phasicity is normal and symmetric with the symptomatic side. No evidence of thrombus. Normal compressibility. Internal Jugular Vein: There is mixed echogenic occlusive thrombus within the right internal jugular vein (images 2 through 5). Subclavian Vein: No evidence of thrombus. Normal compressibility, respiratory phasicity and response to augmentation. Axillary Vein: No evidence of thrombus. Normal compressibility, respiratory phasicity and response to augmentation. Cephalic Vein: There is mixed echogenic occlusive thrombus involving the right cephalic vein extending from the level of the distal forearm (image 29), through the antecubital fossa (image 30), to the proximal humerus (image 31). Note is made of echogenic calcifications involving the thrombus within the mid aspect of the cephalic vein (image 32). Basilic Vein: No evidence of thrombus. Normal compressibility, respiratory phasicity and response to augmentation. Brachial Veins: No evidence of thrombus. Normal compressibility,  respiratory phasicity and response to augmentation. Radial Veins: No evidence of thrombus. Normal compressibility, respiratory phasicity and response to augmentation. Ulnar Veins: No evidence of thrombus. Normal compressibility, respiratory phasicity and response to augmentation. Other Findings:  None visualized. IMPRESSION: 1. The examination is positive for age-indeterminate occlusive DVT within the right internal jugular vein. Given history of end-stage renal disease, this could represent the sequela of previous dialysis catheter placement. Clinical correlation is advised. 2. The examination is positive for occlusive SVT involving the right cephalic vein, age indeterminate though given suspected calcifications, is favored to be chronic in etiology. Electronically Signed   By: Sandi Mariscal M.D.   On: 12/10/2019 14:29   DG Chest Port 1 View  Result Date: 12/02/2019 CLINICAL DATA:  Acute respiratory failure. EXAM: PORTABLE CHEST 1 VIEW COMPARISON:  11/30/2019 FINDINGS: The tracheostomy tube is stable. External pacer paddles are again noted. Stable mild cardiac enlargement and persistent lower lobe predominant airspace process and bilateral pleural effusions. IMPRESSION: Persistent lower lobe predominant airspace process and bilateral pleural effusions. Electronically Signed   By: Marijo Sanes M.D.   On: 12/02/2019 07:23   DG Chest Port 1 View  Result Date: 11/30/2019 CLINICAL DATA:  Acute respiratory failure EXAM: PORTABLE CHEST 1 VIEW COMPARISON:  11/28/2019 FINDINGS: Single frontal view of the chest demonstrates tracheostomy tube overlying tracheal air column tip at thoracic inlet. External defibrillator pads are again noted. Cardiac silhouette is unremarkable. Since the prior exam, there is decreased interstitial prominence. Persistent central vascular congestion with trace right pleural effusion. No pneumothorax. IMPRESSION: 1. Persistent central vascular congestion and trace right pleural effusion, with  improving interstitial edema since prior exam. Electronically Signed   By: Randa Ngo M.D.   On: 11/30/2019 03:14   DG Chest Portable 1 View  Result Date: 11/28/2019 CLINICAL DATA:  Status post cardiac arrest during dialysis. EXAM: PORTABLE CHEST 1 VIEW COMPARISON:  Chest x-ray dated 07/19/2017 FINDINGS: Endotracheal tube in good position 4.7 cm above the carina. Heart size is normal. Slight bilateral haziness, left greater than right, likely represents mild edema. No consolidative infiltrates or effusions. No bone abnormality. IMPRESSION: 1. Endotracheal tube in good position. 2. Mild pulmonary edema. Electronically Signed   By: Lorriane Shire M.D.   On: 11/28/2019 14:33   EEG adult  Result Date: 11/29/2019 Alexis Goodell, MD     11/29/2019  7:02 PM ELECTROENCEPHALOGRAM REPORT Patient: Racer Quam       Room #: IC17A-AA EEG No. ID: [id number] Age: 86  y.o.        Sex: male Requesting Physician: Kasa Report Date:  11/29/2019       Interpreting Physician: Alexis Goodell History: Jenkins Risdon is an 86 y.o. male s/p arrest Medications: Propofol, Fentanyl, Levophed, Insulin Conditions of Recording:  This is a 21 channel routine scalp EEG performed with bipolar and monopolar montages arranged in accordance to the international 10/20 system of electrode placement. One channel was dedicated to EKG recording. The patient is in the intubated and sedated state. Description:  The background activity is slow and poorly organized.  It consists of a low voltage polymorphic delta activity that is continuous and diffusely distributed.  No epileptiform activity is noted.  There is no activation of the background rhythm noted with noxious stimuli.  Marland Kitchen  Hyperventilation and intermittent photic stimulation were not performed. IMPRESSION: This is an abnormal EEG secondary to general background slowing.  This finding may be seen with a diffuse cerebral disturbance that is etiologically nonspecific, but may include a medication  effect, among other possibilities.  No epileptiform activity was noted.  Background lacked reactivity.  Alexis Goodell, MD Neurology [phone] 11/29/2019, 6:58 PM   ECHOCARDIOGRAM COMPLETE  Result Date: 11/30/2019    ECHOCARDIOGRAM REPORT   Patient Name:   [facility] Date of Exam: 11/30/2019 Medical Rec #:  [id number]     Height:       66.0 in Accession #:    [id number]    Weight:       157.0 lb Date of Birth:  Sep 14, 1933     BSA:          1.804 m Patient Age:    4 years      BP:           114/55 mmHg Patient Gender: M             HR:           98 bpm. Exam Location:  [facility] Procedure: 2D Echo and Intracardiac Opacification Agent Indications:     Cardiac Arrest I46.9  History:         Patient has no prior history of Echocardiogram examinations.  Sonographer:     Arville Go RDCS Referring Phys:  [id number] Awilda Bill Diagnosing Phys: Bartholome Bill MD  Sonographer Comments: Echo performed with patient supine and on artificial respirator and Technically difficult study due to poor echo windows. IMPRESSIONS  1. Left ventricular ejection fraction, by estimation, is 25 to 30%. Left ventricular ejection fraction by PLAX is 26 %. The left ventricle has severely decreased function. The left ventricle has no regional wall motion abnormalities. The left ventricular internal cavity size was moderately dilated. Left ventricular diastolic parameters are consistent with Grade I diastolic dysfunction (impaired relaxation).  2. Right ventricular systolic function is normal. The right ventricular size is mildly enlarged.  3. The mitral valve was not well visualized. Trivial mitral valve regurgitation.  4. The aortic valve was not well visualized. Aortic valve regurgitation is trivial. Mild aortic valve sclerosis is present, with no evidence of aortic valve stenosis. FINDINGS  Left Ventricle: Left ventricular ejection fraction, by estimation, is 25 to 30%. Left ventricular ejection fraction by PLAX is 26 %. The left  ventricle has severely decreased function. The left ventricle has no regional wall motion abnormalities. Definity contrast agent was given IV to delineate the left ventricular endocardial borders. The left ventricular internal cavity size was moderately dilated. There is no left ventricular hypertrophy. Left ventricular diastolic parameters are  consistent with Grade I diastolic dysfunction (impaired relaxation). Right Ventricle: The right ventricular size is mildly enlarged. No increase in right ventricular wall thickness. Right ventricular systolic function is normal. Left Atrium: Left atrial size was normal in size. Right Atrium: Right atrial size was normal in size. Pericardium: There is no evidence of pericardial effusion. Mitral Valve: The mitral valve was not well visualized. Trivial mitral valve regurgitation. Tricuspid Valve: The tricuspid valve is not well visualized. Tricuspid valve regurgitation is mild. Aortic Valve: The aortic valve was not well visualized. Aortic valve regurgitation is trivial. Mild aortic valve sclerosis is present, with no evidence of aortic valve stenosis. Aortic valve peak gradient measures 6.4 mmHg. Pulmonic Valve: The pulmonic valve was not well visualized. Pulmonic valve regurgitation is trivial. Aorta: The aortic root was not well visualized. IAS/Shunts: The interatrial septum was not assessed.  LEFT VENTRICLE PLAX 2D LV EF:         Left            Diastology                ventricular     LV e' lateral:   3.05 cm/s                ejection        LV E/e' lateral: 28.8                fraction by     LV e' medial:    5.00 cm/s                PLAX is 26      LV E/e' medial:  17.6                %. LVIDd:         4.98 cm LVIDs:         4.39 cm LV PW:         1.16 cm LV IVS:        1.11 cm LVOT diam:     2.00 cm LV SV:         43 LV SV Index:   24 LVOT Area:     3.14 cm  LV Volumes (MOD) LV vol d, MOD    182.0 ml A2C: LV vol d, MOD    159.0 ml A4C: LV vol s, MOD    136.0 ml A2C: LV  vol s, MOD    126.0 ml A4C: LV SV MOD A2C:   46.0 ml LV SV MOD A4C:   159.0 ml LV SV MOD BP:    38.5 ml RIGHT VENTRICLE RV Basal diam:  3.65 cm RV S prime:     14.00 cm/s TAPSE (M-mode): 2.1 cm LEFT ATRIUM              Index       RIGHT ATRIUM           Index LA diam:        3.40 cm  1.88 cm/m  RA Area:     16.40 cm LA Vol (A2C):   119.0 ml 65.96 ml/m RA Volume:   45.90 ml  25.44 ml/m LA Vol (A4C):   83.3 ml  46.17 ml/m LA Biplane Vol: 99.1 ml  54.93 ml/m  AORTIC VALVE                PULMONIC VALVE AV Area (Vmax): 2.07 cm    PV Vmax:  1.14 m/s AV Vmax:        126.00 cm/s PV Peak grad:  5.2 mmHg AV Peak Grad:   6.4 mmHg LVOT Vmax:      82.90 cm/s LVOT Vmean:     53.500 cm/s LVOT VTI:       0.137 m  AORTA Ao Root diam: 3.60 cm Ao Asc diam:  3.70 cm MITRAL VALVE MV Area (PHT): 4.15 cm     SHUNTS MV Decel Time: 183 msec     Systemic VTI:  0.14 m MV E velocity: 87.80 cm/s   Systemic Diam: 2.00 cm MV A velocity: 103.00 cm/s MV E/A ratio:  0.85 Bartholome Bill MD Electronically signed by Bartholome Bill MD Signature Date/Time: 11/30/2019/11:41:25 AM    Final     Subjective:   Patient was seen and examined 12/13/2019, 10:57 AM Patient stable today. No acute distress.  No issues overnight Stable for discharge.  Discharge Exam:    Vitals:   12/12/19 2025 12/13/19 0554 12/13/19 0730 12/13/19 0732  BP: (!) 112/39 (!) 127/54 (!) 118/59   Pulse: 68 (!) 58 (!) 58   Resp:      Temp: 98.2 F (36.8 C) 98.5 F (36.9 C) 97.6 F (36.4 C)   TempSrc: Oral Oral Oral   SpO2: 97% 97% 98% 93%  Weight:  70.3 kg    Height:        General: No changes remained stable on O2 through the trach 4-5 L  pt lying comfortably in bed & appears in no obvious distress. Cardiovascular: S1 & S2 heard, RRR, S1/S2 +. No murmurs, rubs, gallops or clicks. No JVD or pedal edema. Respiratory: Clear to auscultation without wheezing, rhonchi or crackles. No increased work of breathing. Abdominal:  Non-distended, non-tender & soft.  No organomegaly or masses appreciated. Normal bowel sounds heard. CNS: Alert and oriented. No focal deficits. Extremities: no edema, no cyanosis    The results of significant diagnostics from this hospitalization (including imaging, microbiology, ancillary and laboratory) are listed below for reference.      Microbiology:   No results found for this or any previous visit (from the past 240 hour(s)).   Labs:   CBC: Recent Labs  Lab 12/07/19 0526 12/07/19 0526 12/07/19 0849 12/07/19 0849 12/08/19 0725 12/09/19 0817 12/09/19 1113 12/10/19 0606 12/12/19 2106  WBC 11.4*   < > 11.1*  --  10.6* RESULTS ENTERED IN ERROR 10.7* 11.3*  --   NEUTROABS 9.6*  --   --   --   --   --   --   --   --   HGB 9.4*   < > 9.6*   < > 9.8* TEST WILL BE CREDITED 10.3* 10.8* 10.1*  HCT 25.9*   < > 26.7*   < > RESULTS UNAVAILABLE DUE TO INTERFERING SUBSTANCE TEST WILL BE CREDITED 27.7* 28.9* 28.1*  MCV 88.7   < > 89.6  --  RESULTS UNAVAILABLE DUE TO INTERFERING SUBSTANCE TEST WILL BE CREDITED 87.7 87.0  --   PLT 221   < > 209  --  201 TEST WILL BE CREDITED 215 192  --    < > = values in this interval not displayed.   Basic Metabolic Panel: Recent Labs  Lab 12/07/19 0526 12/07/19 0526 12/08/19 0725 12/08/19 0725 12/09/19 1112 12/10/19 0606 12/11/19 0532 12/12/19 0539 12/13/19 0756  NA 137  --  136  --  134* 135  --   --   --   K 3.5  --  3.7  --  3.7 3.7  --   --   --   CL 92*  --  92*  --  92* 90*  --   --   --   CO2 30  --  26  --  25 24  --   --   --   GLUCOSE 258*  --  180*  --  226* 156*  --   --   --   BUN 48*  --  61*  --  68* 77*  --   --   --   CREATININE 6.26*  --  7.07*  --  7.60* 8.61*  --   --   --   CALCIUM 8.3*  --  8.5*  --  8.1* 8.1*  --   --   --   MG 1.9   < > 2.1   < > 2.2 2.1 2.0 2.2 2.1  PHOS 4.1   < > 5.1*   < > 5.7* 6.4* 4.3 5.8* 5.2*   < > = values in this interval not displayed.   Liver Function Tests: No results for input(s): AST, ALT, ALKPHOS, BILITOT,  PROT, ALBUMIN in the last 168 hours. BNP (last 3 results) Recent Labs    11/28/19 1331  BNP 4,339.6*   Cardiac Enzymes: No results for input(s): CKTOTAL, CKMB, CKMBINDEX, TROPONINI in the last 168 hours. CBG: Recent Labs  Lab 12/11/19 2109 12/12/19 0746 12/12/19 1640 12/12/19 2053 12/13/19 0730  GLUCAP 116* 138* 100* 168* 155*   Hgb A1c No results for input(s): HGBA1C in the last 72 hours. Lipid Profile No results for input(s): CHOL, HDL, LDLCALC, TRIG, CHOLHDL, LDLDIRECT in the last 72 hours. Thyroid function studies No results for input(s): TSH, T4TOTAL, T3FREE, THYROIDAB in the last 72 hours.  Invalid input(s): FREET3 Anemia work up No results for input(s): VITAMINB12, FOLATE, FERRITIN, TIBC, IRON, RETICCTPCT in the last 72 hours. Urinalysis No results found for: COLORURINE, APPEARANCEUR, LABSPEC, [place], GLUCOSEU, HGBUR, BILIRUBINUR, KETONESUR, PROTEINUR, UROBILINOGEN, NITRITE, LEUKOCYTESUR       Time coordinating discharge: Over 55 minutes  SIGNED: Deatra James, MD, FACP, FHM. [facility],  Please use [email or web address] to Page If 7PM-7AM, please contact night-coverage [email or web address] [facility] 12/13/2019, 10:57 AM

## 2019-12-13 NOTE — Progress Notes (Signed)
Pt OOB to chair for 3 hrs this shift. While in chair pt complains of 6/10 chest pressure. VSS. MD notified. Orders for stat EKG and troponin. Chest pain relieved with no intervention. Pt is resting in bed. RN will await any new orders.

## 2019-12-13 NOTE — Progress Notes (Signed)
Physical Therapy Treatment Patient Details Name: Kerry [name] MRN: [id number] DOB: 1933-07-17 Today's Date: 12/13/2019    History of Present Illness Pt is an 86 y.o. 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  Apparently patient lost pulse while having his the most recent dialysis session.  Patient had also developed A. fib RVR and was apneic in the field and required bag mask ventilation.  After arrival to the ER patient again coded x2 with V. fib arrest status post ACLS and ROSC    PT Comments    Pt cleared for OOB activity. Pt lying in bed with HOB elevated and willing to participate in PT and excited to get OOB and into chair. Pt performed therex in bed for BLE strengthening for pre-weightbearing activities. Pt noted to be on 5L at 28% throughout session. Pt performed bed mobility and was able to initiate all movements and required min a for completion into upright sitting EOB. Pt stood with mod+2 A for boosting to stand and balance and noted to utilize BUE heavily on clinicians. Pt denied dizziness with all changes in position however noted to have slightly labored breathing. Pt with good tolerance to activities however fatigue is a limiting factor. L groin site checked and no noted bleeding on gauze.  Vitals: Before activity in bed: 98%, 23 RR, 61 HR Seated EOB: 94%, 63 HR Immediately after transfer to chair: 61%, 43 RR Question accuracy of SpO2 reading as noted pt not to be in any distress and was able to respond to clinician and denied distress.   Pt  making progress toward goals however, at this point, continue to recommend STR at discharge from acute stay to address current strength and functional mobility deficits at this time.    Follow Up Recommendations  SNF     Equipment Recommendations  Rolling walker with 5" wheels;3in1 (PT)    Recommendations for Other Services       Precautions / Restrictions Precautions Precautions: Fall Precaution Comments: Trach; L UE fistula; aspiration; HOB >30 degrees Restrictions Weight Bearing Restrictions: No    Mobility  Bed Mobility Overal bed mobility: Needs Assistance Bed Mobility: Supine to Sit     Supine to sit: Min assist     General bed mobility comments: pt able to initiate movement with BLE and trunk and required min A to complete full upright sitting; pt able to scoot hips toward EOB with SBA  Transfers Overall transfer level: Needs assistance Equipment used: 2 person hand held assist;None Transfers: Sit to/from Stand Sit to Stand: Mod assist;+2 physical assistance         General transfer comment: pt performed sit <> stand from bed with mod A+2 handheld assist for boost into standing and balance; pt with heavy BUE usage pulling on clinician's arms  Ambulation/Gait Ambulation/Gait assistance: Mod assist;+2 physical assistance Gait Distance (Feet): 2 Feet Assistive device: 2 person hand held assist;None   Gait velocity: decreased   General Gait Details: pt able to side step from bed to recliner chair with mod+2 HHA for upright balance; verbal cues for sequencing and noted decreased side step lengths and required increased time  to perform   Stairs             Wheelchair Mobility    Modified Rankin (Stroke Patients Only)       Balance Overall balance assessment: Needs assistance Sitting-balance support: Feet supported;Bilateral upper extremity supported Sitting balance-Leahy Scale: Good Sitting balance - Comments: pt with slight sway initially seated  edge of bed however pt with BUE on lap and noted no overt LOB   Standing balance support: Bilateral upper extremity supported;During functional activity Standing balance-Leahy Scale: Fair Standing balance comment: pt required mod+2 A for standing balance for safety                            Cognition Arousal/Alertness: Awake/alert Behavior During Therapy: WFL for tasks assessed/performed Overall Cognitive Status: Within Functional Limits for tasks assessed                                        Exercises Other Exercises Other Exercises: in semi-supine: AROM AP, QS, and heel slides x 10 bilat; SLR x 5 bilat    General Comments General comments (skin integrity, edema, etc.): RUE swollen; site where central venous catheter was removed remained without incident and no blood noted to be on gauze      Pertinent Vitals/Pain Pain Assessment: No/denies pain    Home Living                      Prior Function            PT Goals (current goals can now be found in the care plan section) Acute Rehab PT Goals Patient Stated Goal: to be able to sit up PT Goal Formulation: With patient Time For Goal Achievement: 12/20/19 Potential to Achieve Goals: Good Progress towards PT goals: Progressing toward goals    Frequency    Min 2X/week      PT Plan Current plan remains appropriate    Co-evaluation              AM-PAC PT "6 Clicks" Mobility   Outcome Measure  Help needed turning from your back to your side while in a flat bed without using bedrails?: A Little Help needed moving from lying on your back to sitting on the side of a flat bed without using bedrails?: A Little Help needed moving to and from a bed to a chair (including a wheelchair)?: A Lot Help needed standing up from a chair using your arms (e.g., wheelchair or bedside chair)?: A Lot Help needed to walk in hospital room?: A Lot Help needed climbing 3-5 steps with a railing?  : Total 6 Click Score: 13    End of Session Equipment Utilized During Treatment: Oxygen Activity Tolerance: Patient limited by fatigue Patient left: in chair;with call bell/phone within reach;with chair alarm set;with SCD's reapplied Nurse Communication: Mobility status PT Visit Diagnosis: Other abnormalities of gait and mobility (R26.89);Muscle weakness (generalized) (M62.81);Difficulty in walking, not elsewhere classified (R26.2)     Time: 1010-1037 PT Time Calculation (min) (ACUTE ONLY): 27 min  Charges:  $Gait Training: 8-22 mins $Therapeutic Exercise: 8-22 mins                    Kerry [name], SPT   Kerry [name] 12/13/2019, 2:32 PM

## 2019-12-14 LAB — BASIC METABOLIC PANEL
Anion gap: 14 (ref 5–15)
BUN: 59 mg/dL — ABNORMAL HIGH (ref 8–23)
CO2: 28 mmol/L (ref 22–32)
Calcium: 8.5 mg/dL — ABNORMAL LOW (ref 8.9–10.3)
Chloride: 96 mmol/L — ABNORMAL LOW (ref 98–111)
Creatinine, Ser: 7.68 mg/dL — ABNORMAL HIGH (ref 0.61–1.24)
GFR calc Af Amer: 7 mL/min — ABNORMAL LOW (ref >60)
GFR calc non Af Amer: 6 mL/min — ABNORMAL LOW (ref >60)
Glucose, Bld: 192 mg/dL — ABNORMAL HIGH (ref 70–99)
Potassium: 4.3 mmol/L (ref 3.5–5.1)
Sodium: 138 mmol/L (ref 135–145)

## 2019-12-14 LAB — PHOSPHORUS: Phosphorus: 5.6 mg/dL — ABNORMAL HIGH (ref 2.5–4.6)

## 2019-12-14 LAB — CBC
HCT: 27.6 % — ABNORMAL LOW (ref 39.0–52.0)
Hemoglobin: 10 g/dL — ABNORMAL LOW (ref 13.0–17.0)
MCH: 32.1 pg (ref 26.0–34.0)
MCHC: 36.2 g/dL — ABNORMAL HIGH (ref 30.0–36.0)
MCV: 88.5 fL (ref 80.0–100.0)
Platelets: 236 10*3/uL (ref 150–400)
RBC: 3.12 MIL/uL — ABNORMAL LOW (ref 4.22–5.81)
RDW: 18.3 % — ABNORMAL HIGH (ref 11.5–15.5)
WBC: 9.1 10*3/uL (ref 4.0–10.5)
nRBC: 0 % (ref 0.0–0.2)

## 2019-12-14 LAB — MAGNESIUM: Magnesium: 2.2 mg/dL (ref 1.7–2.4)

## 2019-12-14 LAB — GLUCOSE, CAPILLARY
Glucose-Capillary: 139 mg/dL — ABNORMAL HIGH (ref 70–99)
Glucose-Capillary: 177 mg/dL — ABNORMAL HIGH (ref 70–99)
Glucose-Capillary: 123 mg/dL — ABNORMAL HIGH (ref 70–99)
Glucose-Capillary: 193 mg/dL — ABNORMAL HIGH (ref 70–99)

## 2019-12-14 LAB — SARS CORONAVIRUS 2 (TAT 6-24 HRS): SARS Coronavirus 2: NEGATIVE

## 2019-12-14 NOTE — Progress Notes (Signed)
Progress note    Patient: Kerry [name]                   Admit date: 11/28/2019   DOB: 84-Aug-1935             Discharge date:12/14/2019/11:19 AM JJK:[id number]                          PCP: Cecile Sheerer, MD  Disposition: SNF   Recommendations for Outpatient Follow-up:   . Follow up: in 1 week  . Continue with hemodialysis as scheduled . Follow with nephrology . Palliative care consult  Discharge Condition: Stable   Code Status:   Code Status: Full Code  Diet recommendation: Regular healthy diet/   Pending transfer to SNF.  Trial of patient tolerating to set up for minimal 4 hours for hemodialysis He is due for hemodialysis today We will further evaluate still on 6 L of oxygen via trach, satting 90%    Discharge Diagnoses:    Active Problems:   Anemia of chronic disease   CKD (chronic kidney disease) stage 4, GFR 15-29 ml/min (HCC)   Coronary artery disease   Heart failure with preserved ejection fraction (HCC)   History of tracheostomy   Hypertension   Type 2 diabetes mellitus ([place])   Cardiac arrest ([place])   History of Present Illness/ Hospital Course [name] Summary:   Patient with advanced CHF and COPD, three-vessel CAD, sleep apnea, diverticulosis, end-stage renal failure on dialysis, 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   Apparently patient lost pulse while having his the most recent dialysis session. Patient had also developed A. fib RVR and was apneic in the field and required bag mask ventilation. After arrival to the ER patient again coded x2 with V. fib arrest status post ACLS and ROSC, found to be amiodarone and 20 MCG  Levophed.Runnels bedside. He underwent ACLS x 3. He was placed on the arctic sun protocol.  The patient required mechanical ventilation via tracheostomy. He was placed on an amiodarone gtt. Triple lumen catheter was placed. TTE was performed and demonstrated EF 25-30%. 26% LVEF by PLAX. Grade 1 diastolic dysfunction. RV is mildly enlarged.   The patient remains on low dose amiodarone gtt. Pressors off this morning per nursing. Blood pressure have been stable since yesterday afternoon.   Subjective: The patient was seen and examined this morning, remains on 6 L of oxygen via trach, satting 90%.  In no acute distress denies any chest pain or shortness of breath  Yesterday -patient developed brief episode of chest pain, vitals were stable, EKG was normal with mild bradycardia.. Troponin mildly elevated but significantly lower than previous troponin trending down.     LOS: 84 days   A & P   Severe ACUTE Hypoxic and Hypercapnic Respiratory Failure:  Remained stable, , chronic tracheostomy, 5-6 L of oxygen  via trach site neck Satting >95% With a history of laryngectomy   Laryngeal Carcinoma:  Stable. Chronic tracheostomy -PCCM, RT following, continue as needed nebs   Acute Systolic Cardiac Failure:  Remained stable on  oxygen, satting greater than 92% EF 20%. Complicated by Atrial fibrillation with RVR and ischemic cardiomyopathy.  SYSTOLIC CARDIAC FAILURE- EF20%. The patient continues to require IV amiodarone as he is still unable to take PO. No anticoagulation due to bleeding history and anemia. Cardiology does not feel that arrest was a primary ischemic event. Echo did not reveal changes in wall motion or EF.   Hypotension:  -BP stabilized improved -Home BP meds modified, DC Pt is off of pressors.  ESRD on HD:  -Nephrology following closely -Hemodialysis per nephrology -Medications are dosed renal -Due for hemodialysis today 12/14/2019    Cardiogenic  Shock:  -Status post ICD management, patient has been stabilized off pressors -BP stabilized We will continue to monitor closely   Altered Mental Status:  Improved, at baseline now  Right arm edema, -Likely venous stasis -Ultrasound negative for DVT  Right IJ DVT, right cephalic vein SVT -Patient has been on heparin, will switch to Eliquis    DVT Prophylaxis: SCD's CODE STATUS: Full Code --- we recommend to continue palliative care follow-up for determining goals of care, CODE STATUS near future. Family Communication: Wife at bedside discussed in detail regarding goals of care, and palliative follow-up Disposition: From Home. Anticipate discharge to SNF/Rehab.   Dispo: To SNF Continue hemodialysis as scheduled -last hemodialysis today 12/12/2019 (Tuesday Thursday Saturday) Continue trach hygiene, with as needed suction Continue O2 via trach Continue palliative care discussion   Venous ultrasound  IMPRESSION: 1. The examination is positive for age-indeterminate occlusive DVT within the right internal jugular vein. Given history of end-stage renal disease, this could represent the sequela of previous dialysis catheter placement. Clinical correlation is advised. 2. The examination is positive for occlusive SVT involving the right cephalic vein, age indeterminate though given suspected calcifications, is favored to be chronic in etiology.    Consultants   Cardiology  Nephrology  PCCM  Procedures   Dialysis  Mechanical ventilation        Nutritional status:  Nutrition Problem: Increased nutrient needs Etiology: catabolic illness (CHF, COPD, ESRD on HD) Signs/Symptoms: estimated needs Interventions: Nepro shake, MVI   Discharge Instructions:   Discharge Instructions    Activity as tolerated - No restrictions   Complete by: As directed    Activity as tolerated - No restrictions   Complete by: As directed    Call MD for:  difficulty  breathing, headache or visual disturbances   Complete by: As directed    Call MD for:  persistant nausea and vomiting   Complete by: As directed    Call MD for:  temperature >100.4   Complete by: As directed    Diet - low sodium heart healthy   Complete by: As directed    Discharge instructions   Complete by: As directed    Follow-up at nursing facility, continue trach hygiene... With suction. Continue aggressive PT OT. Fall precaution May taper down O2 demand, keep O2 sat greater than 92%.   Discharge instructions   Complete by: As directed    Continue palliative discussion Continue modalities as scheduled   Increase activity slowly   Complete by: As directed    Increase activity slowly   Complete by: As directed    Remove dressing in 24 hours   Complete by: As directed    Remove dressing in 24 hours   Complete by: As directed    Remove dressing in 48 hours   Complete by: As directed    Remove dressing in 48 hours   Complete by: As directed        Medication List  STOP taking these medications   amLODipine 2.5 MG tablet Commonly known as: NORVASC   aspirin 81 MG chewable tablet   clopidogrel 75 MG tablet Commonly known as: PLAVIX   hydrALAZINE 25 MG tablet Commonly known as: APRESOLINE   isosorbide dinitrate 20 MG tablet Commonly known as: ISORDIL   mirtazapine 7.5 MG tablet Commonly known as: REMERON   nitroGLYCERIN 0.3 MG SL tablet Commonly known as: NITROSTAT   pantoprazole 40 MG tablet Commonly known as: PROTONIX   torsemide 20 MG tablet Commonly known as: DEMADEX   Triphrocaps 1 MG Caps Replaced by: multivitamin Tabs tablet   Vitamin D3 25 MCG (1000 UT) Caps     TAKE these medications   alteplase 2 MG injection Commonly known as: CATHFLO ACTIVASE 2 mg by Intracatheter route once as needed for open catheter.   amiodarone 100 MG tablet Commonly known as: PACERONE Take 1 tablet (100 mg total) by mouth daily.   apixaban 5 MG Tabs  tablet Commonly known as: ELIQUIS Take 2 tablets (10 mg total) by mouth 2 (two) times daily for 6 days.   apixaban 5 MG Tabs tablet Commonly known as: ELIQUIS Take 1 tablet (5 mg total) by mouth 2 (two) times daily. Start taking on: December 18, 2019   atorvastatin 80 MG tablet Commonly known as: LIPITOR Take 80 mg by mouth daily.   chlorhexidine gluconate (MEDLINE KIT) 0.12 % solution Commonly known as: PERIDEX 15 mLs by Mouth Rinse route 2 (two) times daily.   Chlorhexidine Gluconate Cloth 2 % Pads Apply 6 each topically daily at 6 (six) AM.   epoetin alfa 2000 UNIT/ML injection Commonly known as: EPOGEN Inject 1 mL (2,000 Units total) into the vein Every Tuesday,Thursday,and Saturday with dialysis.   feeding supplement (NEPRO CARB STEADY) Liqd Take 237 mLs by mouth 2 (two) times daily between meals for 10 days.   ferrous sulfate 325 (65 FE) MG EC tablet Take 325 mg by mouth daily with breakfast.   glipiZIDE XL 10 MG 24 hr tablet Generic drug: glipiZIDE Take 10 mg by mouth daily with breakfast.   Glucagon Emergency 1 MG Kit as needed. Reported on 06/02/2015   insulin aspart 100 UNIT/ML injection Commonly known as: novoLOG Inject 0-5 Units into the skin at bedtime.   lidocaine-prilocaine cream Commonly known as: EMLA Apply 1 application topically as needed (topical anesthesia for hemodialysis if Gebauers and Lidocaine injection are ineffective.).   metoprolol succinate 25 MG 24 hr tablet Commonly known as: TOPROL-XL Take 0.5 tablets (12.5 mg total) by mouth daily. What changed: how much to take   midodrine 10 MG tablet Commonly known as: PROAMATINE Take 1 tablet (10 mg total) by mouth 3 (three) times daily with meals.   multivitamin Tabs tablet Take 1 tablet by mouth at bedtime. Replaces: Triphrocaps 1 MG Caps   pentafluoroprop-tetrafluoroeth Aero Commonly known as: GEBAUERS Apply 1 application topically as needed (topical anesthesia for hemodialysis).    polyethylene glycol 17 g packet Commonly known as: MIRALAX / GLYCOLAX Place 17 g into feeding tube daily as needed for moderate constipation.   Precision QID Test test strip Generic drug: glucose blood Accu check Aviva strips, use twice daily   sevelamer carbonate 800 MG tablet Commonly known as: RENVELA Take 800-1,600 mg by mouth in the morning, at noon, in the evening, and at bedtime.       Contact information for after-discharge care    Destination    [facility] Preferred SNF .   Service:  Skilled Nursing Contact information: [place]                 Allergies  Allergen Reactions  . Ace Inhibitors Other (See Comments)    Potassium elevated  . Gabapentin Other (See Comments)    Ataxia at 100 mg.  . Losartan Other (See Comments)    Hyperkalemia     Procedures /Studies:   CT HEAD WO CONTRAST  Result Date: 11/28/2019 CLINICAL DATA:  Status post cardiac arrest. EXAM: CT HEAD WITHOUT CONTRAST TECHNIQUE: Contiguous axial images were obtained from the base of the skull through the vertex without intravenous contrast. COMPARISON:  None. FINDINGS: Brain: There is mild cerebral atrophy with widening of the extra-axial spaces and ventricular dilatation. There are areas of decreased attenuation within the white matter tracts of the supratentorial brain, consistent with microvascular disease changes. Vascular: No hyperdense vessel or unexpected calcification. Skull: Normal. Negative for fracture or focal lesion. Sinuses/Orbits: No acute finding. Other: None. IMPRESSION: 1. Generalized cerebral atrophy. 2. No acute intracranial abnormality. Electronically Signed   By: Virgina Norfolk M.D.   On: 11/28/2019 18:06   CT ABDOMEN PELVIS W CONTRAST  Result Date: 12/06/2019 CLINICAL DATA:  Abdominal distention EXAM: CT ABDOMEN AND PELVIS WITH CONTRAST TECHNIQUE: Multidetector CT imaging of the abdomen and pelvis was  performed using the standard protocol following bolus administration of intravenous contrast. CONTRAST:  18m OMNIPAQUE IOHEXOL 300 MG/ML  SOLN COMPARISON:  None. FINDINGS: Lower chest: Moderate bilateral pleural effusions with compressive atelectasis in the lower lobes. Densely calcified visualized coronary arteries. Cardiomegaly. Hepatobiliary: Subtle layering high-density material within the gallbladder could reflect small stones or sludge/gravel. No focal hepatic abnormality. Pancreas: No focal abnormality or ductal dilatation. Spleen: No focal abnormality.  Normal size. Adrenals/Urinary Tract: Kidneys are small bilaterally. Reflux of contrast from the right heart through the IVC into the right renal veins suggests right heart dysfunction. No hydronephrosis. No renal or adrenal mass. Urinary bladder decompressed. Stomach/Bowel: Colonic diverticulosis. No active diverticulitis. Stomach and small bowel decompressed, unremarkable. Moderate stool burden in the colon. Vascular/Lymphatic: Aortic atherosclerosis. No enlarged abdominal or pelvic lymph nodes. Reproductive: Mildly prominent prostate. Other: Small to moderate free fluid in the abdomen and pelvis. Extensive subcutaneous edema throughout the subcutaneous soft tissues suggesting anasarca. Musculoskeletal: No acute bony abnormality. IMPRESSION: Moderate bilateral pleural effusions with compressive atelectasis in the lower lobes. Small kidneys bilaterally with poor excretion of contrast suggesting kidney disease. Aortic atherosclerosis. Small to moderate free fluid in the abdomen and pelvis. Diffuse anasarca like fluid throughout the subcutaneous soft tissues. Moderate stool burden in the colon.  Left colonic diverticulosis. Reflux of contrast from the right heart through the IVC into the hepatic veins and right renal vein suggesting right heart dysfunction. Electronically Signed   By: KRolm BaptiseM.D.   On: 12/06/2019 18:45   UKoreaVenous Img Upper Uni  Right(DVT)  Result Date: 12/10/2019 CLINICAL DATA:  Right upper extremity pain and. History of end-stage renal disease. Former smoker. Evaluate for DVT. EXAM: RIGHT UPPER EXTREMITY VENOUS DOPPLER ULTRASOUND TECHNIQUE: Gray-scale sonography with graded compression, as well as color Doppler and duplex ultrasound were performed to evaluate the upper extremity deep venous system from the level of the subclavian vein and including the jugular, axillary, basilic, radial, ulnar and upper cephalic vein. Spectral Doppler was utilized to evaluate flow at rest and with distal augmentation maneuvers. COMPARISON:  Chest radiograph-01/01/2020 FINDINGS: Contralateral Subclavian Vein: Respiratory phasicity is normal and symmetric with the  symptomatic side. No evidence of thrombus. Normal compressibility. Internal Jugular Vein: There is mixed echogenic occlusive thrombus within the right internal jugular vein (images 2 through 5). Subclavian Vein: No evidence of thrombus. Normal compressibility, respiratory phasicity and response to augmentation. Axillary Vein: No evidence of thrombus. Normal compressibility, respiratory phasicity and response to augmentation. Cephalic Vein: There is mixed echogenic occlusive thrombus involving the right cephalic vein extending from the level of the distal forearm (image 29), through the antecubital fossa (image 30), to the proximal humerus (image 31). Note is made of echogenic calcifications involving the thrombus within the mid aspect of the cephalic vein (image 32). Basilic Vein: No evidence of thrombus. Normal compressibility, respiratory phasicity and response to augmentation. Brachial Veins: No evidence of thrombus. Normal compressibility, respiratory phasicity and response to augmentation. Radial Veins: No evidence of thrombus. Normal compressibility, respiratory phasicity and response to augmentation. Ulnar Veins: No evidence of thrombus. Normal compressibility, respiratory phasicity and  response to augmentation. Other Findings:  None visualized. IMPRESSION: 1. The examination is positive for age-indeterminate occlusive DVT within the right internal jugular vein. Given history of end-stage renal disease, this  ECHOCARDIOGRAM COMPLETE  Result Date: 11/30/2019    ECHOCARDIOGRAM REPORT   Patient Name:   Dantae Setterlund Date of Exam: 11/30/2019 Medical Rec #:  [id number]     Height:       66.0 in Accession #:    [id number]    Weight:       157.0 lb Date of Birth:  12-18-1933     BSA:          1.804 m Patient Age:    23 years      BP:           114/55 mmHg Patient Gender: M             HR:           98 bpm. Exam Location:  [facility] Procedure: 2D Echo and Intracardiac Opacification Agent Indications:     Cardiac Arrest I46.9  History:         Patient has no prior history of Echocardiogram examinations.  Sonographer:     Arville Go RDCS Referring Phys:  [id number] Awilda Bill Diagnosing Phys: Bartholome Bill MD  Sonographer Comments: Echo performed with patient supine and on artificial respirator and Technically difficult study due to poor echo windows. IMPRESSIONS  1. Left ventricular ejection fraction, by estimation, is 25 to 30%. Left ventricular ejection fraction by PLAX is 26 %. The left ventricle has severely decreased function. The left ventricle has no regional wall motion abnormalities. The left ventricular internal cavity size was moderately dilated. Left ventricular diastolic parameters are consistent with Grade I diastolic dysfunction (impaired relaxation).  2. Right ventricular systolic function is normal. The right ventricular size is mildly enlarged.  3. The mitral valve was not well visualized. Trivial mitral valve regurgitation.  4. The aortic valve was not well visualized. Aortic valve regurgitation is trivial. Mild aortic valve sclerosis is present, with no evidence of aortic valve stenosis. FINDINGS  Left Ventricle: Left ventricular ejection fraction, by estimation, is 25 to 30%. Left  ventricular ejection fraction by PLAX is 26 %. The left ventricle has severely decreased function. The left ventricle has no regional wall motion abnormalities. Definity contrast agent was given IV to delineate the left ventricular endocardial borders. The left ventricular internal cavity size was moderately dilated. There is no left ventricular hypertrophy. Left ventricular diastolic parameters are consistent with Grade I diastolic  dysfunction (impaired relaxation). Right Ventricle: The right ventricular size is mildly enlarged. No increase in right ventricular wall thickness. Right ventricular systolic function is normal. Left Atrium: Left atrial size was normal in size. Right Atrium: Right atrial size was normal in size. Pericardium: There is no evidence of pericardial effusion. Mitral Valve: The mitral valve was not well visualized. Trivial mitral valve regurgitation. Tricuspid Valve: The tricuspid valve is not well visualized. Tricuspid valve regurgitation is mild. Aortic Valve:    Subjective:   Patient was seen and examined 12/14/2019, 11:19 AM Patient stable today. No acute distress.  No issues overnight Stable for discharge.  Discharge Exam:     Physical Exam  Constitution:  Alert, cooperative, no distress, remains on 5-6 L of oxygen via trach Psychiatric: Normal and stablect,   HEENT: Chronic trach, O2 via via trach normocephalic, PERRL, otherwise with in Normal limits  Chest:Chest symmetric Cardio vascular:  S1/S2, RRR, No murmure, No Rubs or Gallops  pulmonary: Clear to auscultation bilaterally, respirations unlabored, negative wheezes / crackles Abdomen: Soft, non-tender, non-distended, bowel sounds,no masses, no organomegaly Muscular skeletal: Limited exam - in bed, able to move all 4 extremities, Normal strength,  Neuro: CNII-XII intact. , normal motor and sensation, reflexes intact  Extremities: No pitting edema lower extremities, +2 pulses  Skin: Dry, warm to touch, negative for  any Rashes, No open wounds Wounds: per nursing documentation,       The results of significant diagnostics from this hospitalization (including imaging, microbiology, ancillary and laboratory) are listed below for reference.      Microbiology:   Recent Results (from the past 240 hour(s))  SARS CORONAVIRUS 2 (TAT 6-24 HRS) Nasopharyngeal Nasopharyngeal Swab     Status: None   Collection Time: 12/13/19  6:15 PM   Specimen: Nasopharyngeal Swab  Result Value Ref Range Status   SARS Coronavirus 2 NEGATIVE NEGATIVE Final    Comment: (NOTE) SARS-CoV-2 target nucleic acids are NOT DETECTED.  The Performed at [facility], [place] [address], [place], [place] [address]      Labs:   CBC: Recent Labs  Lab 12/08/19 0725 12/09/19 0817 12/09/19 1113 12/10/19 0606 12/12/19 2106  WBC 10.6* RESULTS ENTERED IN ERROR 10.7* 11.3*  --   HGB 9.8* TEST WILL BE CREDITED 10.3* 10.8* 10.1*  HCT RESULTS UNAVAILABLE DUE TO INTERFERING SUBSTANCE TEST WILL BE CREDITED 27.7* 28.9* 28.1*  MCV RESULTS UNAVAILABLE DUE TO INTERFERING SUBSTANCE TEST WILL BE CREDITED 87.7 87.0  --   PLT 201 TEST WILL BE CREDITED 215 192  --    Basic Metabolic Panel: Recent Labs  Lab 12/08/19 0725 12/08/19 0725 12/09/19 1112 12/10/19 0606 12/11/19 0532 12/12/19 0539 12/13/19 0756  NA 136  --  134* 135  --   --   --   K 3.7  --  3.7 3.7  --   --   --   CL 92*  --  92* 90*  --   --   --   CO2 26  --  25 24  --   --   --   GLUCOSE 180*  --  226* 156*  --   --   --   BUN 61*  --  68* 77*  --   --   --   CREATININE 7.07*  --  7.60* 8.61*  --   --   --   CALCIUM 8.5*  --  8.1* 8.1*  --   --   --   MG 2.1   < >  2.2 2.1 2.0 2.2 2.1  PHOS 5.1*   < > 5.7* 6.4* 4.3 5.8* 5.2*   < > = values in this interval not displayed.   Liver Function Tests: No results for input(s): AST, ALT, ALKPHOS, BILITOT, PROT, ALBUMIN in the last 168 hours. BNP (last 3 results) Recent Labs    11/28/19 1331  BNP 4,339.6*   Cardiac  Enzymes: No results for input(s): CKTOTAL, CKMB, CKMBINDEX, TROPONINI in the last 168 hours. CBG: Recent Labs  Lab 12/13/19 0730 12/13/19 1135 12/13/19 1553 12/13/19 2045 12/14/19 0814  GLUCAP 155* 156* 164* 159* 139*   Hgb Time coordinating discharge: Over 35 minutes  SIGNED: Deatra James, MD, FACP, FHM. [facility],  Please use [email or web address] to Page If 7PM-7AM, please contact night-coverage [email or web address], Password [facility] 12/14/2019, 11:19 AM

## 2019-12-14 NOTE — Progress Notes (Signed)
This note also relates to the following rows which could not be included: Pulse Rate - Cannot attach notes to unvalidated device data Resp - Cannot attach notes to unvalidated device data SpO2 - Cannot attach notes to unvalidated device data  HD assessment. 

## 2019-12-14 NOTE — Progress Notes (Signed)
Patient taken to Dialysis via bed with 28% oxygen. Patient not dyspneic and is alert.

## 2019-12-14 NOTE — Progress Notes (Signed)
[facility]  ROUNDING NOTE   Subjective:  Patient due for dialysis treatment today per usual schedule. Wife at bedside.    Objective:  Vital signs in last 24 hours:  Temp:  [97.8 F (36.6 C)-98.9 F (37.2 C)] 97.8 F (36.6 C) (07/10 1153) Pulse Rate:  [58-64] 61 (07/10 1153) Resp:  [17] 17 (07/10 1153) BP: (111-130)/(53-78) 117/55 (07/10 1153) SpO2:  [90 %-98 %] 93 % (07/10 1153) FiO2 (%):  [28 %-40 %] 28 % (07/09 2150) Weight:  [71.7 kg] 71.7 kg (07/10 0502)  Weight change: -0.998 kg Filed Weights   12/12/19 1045 12/13/19 0554 12/14/19 0502  Weight: 72.7 kg 70.3 kg 71.7 kg    Intake/Output: I/O last 3 completed shifts: In: 240 [P.O.:240] Out: 0    Intake/Output this shift:  No intake/output data recorded.  Physical Exam: General:  No acute distress,   HEENT  anicteric, moist oral mucous membrane, status post laryngectomy,   Pulm/lungs  normal breathing effort, tracheostomy noted  CVS/Heart  no rub or gallop, irregular rhythm  Abdomen:   Soft, nontender  Extremities:  + Dependent peripheral edema in thighs and arms  Neurologic:  Alert, able to follow commands  Skin:  No acute rashes   Access: Left arm AVG    Basic Metabolic Panel: Recent Labs  Lab 12/08/19 0725 12/08/19 0725 12/09/19 1112 12/10/19 0606 12/11/19 0532 12/12/19 0539 12/13/19 0756  NA 136  --  134* 135  --   --   --   K 3.7  --  3.7 3.7  --   --   --   CL 92*  --  92* 90*  --   --   --   CO2 26  --  25 24  --   --   --   GLUCOSE 180*  --  226* 156*  --   --   --   BUN 61*  --  68* 77*  --   --   --   CREATININE 7.07*  --  7.60* 8.61*  --   --   --   CALCIUM 8.5*  --  8.1* 8.1*  --   --   --   MG 2.1   < > 2.2 2.1 2.0 2.2 2.1  PHOS 5.1*   < > 5.7* 6.4* 4.3 5.8* 5.2*   < > = values in this interval not displayed.    Liver Function Tests: No results for input(s): AST, ALT, ALKPHOS, BILITOT, PROT, ALBUMIN in the last 168 hours. No results for input(s): LIPASE, AMYLASE in  the last 168 hours. No results for input(s): AMMONIA in the last 168 hours.  CBC: Recent Labs  Lab 12/08/19 0725 12/09/19 0817 12/09/19 1113 12/10/19 0606 12/12/19 2106  WBC 10.6* RESULTS ENTERED IN ERROR 10.7* 11.3*  --   HGB 9.8* TEST WILL BE CREDITED 10.3* 10.8* 10.1*  HCT RESULTS UNAVAILABLE DUE TO INTERFERING SUBSTANCE TEST WILL BE CREDITED 27.7* 28.9* 28.1*  MCV RESULTS UNAVAILABLE DUE TO INTERFERING SUBSTANCE TEST WILL BE CREDITED 87.7 87.0  --   PLT 201 TEST WILL BE CREDITED 215 192  --     Cardiac Enzymes: No results for input(s): CKTOTAL, CKMB, CKMBINDEX, TROPONINI in the last 168 hours.  BNP: Invalid input(s): POCBNP  CBG: Recent Labs  Lab 12/13/19 1135 12/13/19 1553 12/13/19 2045 12/14/19 0814 12/14/19 1154  GLUCAP 156* 164* 159* 139* 177*    Microbiology: Results for orders placed or performed during the hospital encounter of 11/28/19  Culture, blood (routine x 2)     Status: None   Collection Time: 11/28/19  1:31 PM   Specimen: BLOOD  Result Value Ref Range Status   Specimen Description BLOOD BLOOD RIGHT ARM  Final   Special Requests   Final    BOTTLES DRAWN AEROBIC AND ANAEROBIC Blood Culture adequate volume   Culture   Final    NO GROWTH 5 DAYS Performed at [facility], [address] [place], [place] [address]    Report Status 12/03/2019 FINAL  Final  Culture, blood (routine x 2)     Status: None   Collection Time: 11/28/19  2:17 PM   Specimen: BLOOD  Result Value Ref Range Status   Specimen Description BLOOD BLOOD RIGHT WRIST  Final   Special Requests   Final    BOTTLES DRAWN AEROBIC AND ANAEROBIC Blood Culture adequate volume   Culture   Final    NO GROWTH 5 DAYS Performed at [facility], [address] [place], [place] [address]    Report Status 12/03/2019 FINAL  Final  SARS Coronavirus 2 by RT PCR (hospital order, performed in [facility] hospital lab) Nasopharyngeal Nasopharyngeal Swab     Status: None    Collection Time: 11/28/19  4:40 PM   Specimen: Nasopharyngeal Swab  Result Value Ref Range Status   SARS Coronavirus 2 NEGATIVE NEGATIVE Final    Comment: (NOTE) SARS-CoV-2 target nucleic acids are NOT DETECTED.  The SARS-CoV-2 RNA is generally detectable in upper and lower respiratory specimens during the acute phase of infection. The lowest concentration of SARS-CoV-2 viral copies this assay can detect is 250 copies / mL. A negative result does not preclude SARS-CoV-2 infection and should not be used as the sole basis for treatment or other patient management decisions.  A negative result may occur with improper specimen collection / handling, submission of specimen other than nasopharyngeal swab, presence of viral mutation(s) within the areas targeted by this assay, and inadequate number of viral copies (<250 copies / mL). A negative result must be combined with clinical observations, patient history, and epidemiological information.  Fact Sheet for Patients:   StrictlyIdeas.no  Fact Sheet for Healthcare Providers: BankingDealers.co.za  This test is not yet approved or  cleared by the [place] FDA and has been authorized for detection and/or diagnosis of SARS-CoV-2 by FDA under an Emergency Use Authorization (EUA).  This EUA will remain in effect (meaning this test can be used) for the duration of the COVID-19 declaration under Section 564(b)(1) of the Act, 21 U.S.C. section 360bbb-3(b)(1), unless the authorization is terminated or revoked sooner.  Performed at [facility], [address] [place], [place] [address]   MRSA PCR Screening     Status: None   Collection Time: 11/28/19  9:08 PM   Specimen: Nasopharyngeal  Result Value Ref Range Status   MRSA by PCR NEGATIVE NEGATIVE Final    Comment:        The GeneXpert MRSA Assay (FDA approved for NASAL specimens only), is one component of a comprehensive MRSA  colonization surveillance program. It is not intended to diagnose MRSA infection nor to guide or monitor treatment for MRSA infections. Performed at [facility], [place], [place] [address]   SARS CORONAVIRUS 2 (TAT 6-24 HRS) Nasopharyngeal Nasopharyngeal Swab     Status: None   Collection Time: 12/13/19  6:15 PM   Specimen: Nasopharyngeal Swab  Result Value Ref Range Status   SARS Coronavirus 2  NEGATIVE NEGATIVE Final    Comment: (NOTE) SARS-CoV-2 target nucleic acids are NOT DETECTED.  The SARS-CoV-2 RNA is generally detectable in upper and lower respiratory specimens during the acute phase of infection. Negative results do not preclude SARS-CoV-2 infection, do not rule out co-infections with other pathogens, and should not be used as the sole basis for treatment or other patient management decisions. Negative results must be combined with clinical observations, patient history, and epidemiological information. The expected result is Negative.  Fact Sheet for Patients: SugarRoll.be  Fact Sheet for Healthcare Providers: https://www.woods-mathews.com/  This test is not yet approved or cleared by the [place] FDA and  has been authorized for detection and/or diagnosis of SARS-CoV-2 by FDA under an Emergency Use Authorization (EUA). This EUA will remain  in effect (meaning this test can be used) for the duration of the COVID-19 declaration under Se ction 564(b)(1) of the Act, 21 U.S.C. section 360bbb-3(b)(1), unless the authorization is terminated or revoked sooner.  Performed at [facility], [place] [address] [place], [place] [address]     Coagulation Studies: No results for input(s): LABPROT, INR in the last 72 hours.  Urinalysis: No results for input(s): COLORURINE, LABSPEC, PHURINE, GLUCOSEU, HGBUR, BILIRUBINUR, KETONESUR, PROTEINUR, UROBILINOGEN, NITRITE, LEUKOCYTESUR in the last 72  hours.  Invalid input(s): APPERANCEUR    Imaging: No results found.   Medications:   . sodium chloride Stopped (12/06/19 1834)  . sodium chloride Stopped (11/28/19 2021)  . sodium chloride    . sodium chloride     . sodium chloride   Intravenous Once  . amiodarone  100 mg Oral Daily  . apixaban  10 mg Oral BID   Followed by  . [START ON 12/18/2019] apixaban  5 mg Oral BID  . chlorhexidine gluconate (MEDLINE KIT)  15 mL Mouth Rinse BID  . Chlorhexidine Gluconate Cloth  6 each Topical Q0600  . epoetin (EPOGEN/PROCRIT) injection  2,000 Units Intravenous Q T,Th,Sa-HD  . famotidine  20 mg Oral Daily  . feeding supplement (NEPRO CARB STEADY)  237 mL Oral BID BM  . insulin aspart  0-5 Units Subcutaneous QHS  . insulin aspart  0-6 Units Subcutaneous TID WC  . midodrine  10 mg Oral TID WC  . multivitamin  1 tablet Oral QHS  . sodium chloride flush  10-40 mL Intracatheter Q12H   sodium chloride, sodium chloride, alteplase, bisacodyl, heparin, HYDROmorphone (DILAUDID) injection, lidocaine (PF), lidocaine-prilocaine, ondansetron (ZOFRAN) IV, pentafluoroprop-tetrafluoroeth, polyethylene glycol, sodium chloride flush  Assessment/ Plan:  Mr. Cyruss Arata is a 86 y.o. 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 End-stage renal disease with volume overload:   Patient due for hemodialysis today.  Continue midodrine and albumin for blood pressure support.  2. Hypokalemia:  Potassium normalized previously.  3.  Atrial fibrillation Monitor rate and rhythm  during dialysis treatments.  4. Anemia with chronic kidney disease: receives Mircera as outpatient.  PRBC transfusion 6/29.  -Hemoglobin at target.  Continue Epogen with dialysis treatments. Lab Results  Component Value Date   HGB 10.1 (L) 12/12/2019      LOS: 16 Judine Arciniega 7/10/20211:31 PM

## 2019-12-15 LAB — BASIC METABOLIC PANEL
Anion gap: 15 (ref 5–15)
BUN: 45 mg/dL — ABNORMAL HIGH (ref 8–23)
CO2: 28 mmol/L (ref 22–32)
Calcium: 8.5 mg/dL — ABNORMAL LOW (ref 8.9–10.3)
Chloride: 97 mmol/L — ABNORMAL LOW (ref 98–111)
Creatinine, Ser: 6.11 mg/dL — ABNORMAL HIGH (ref 0.61–1.24)
GFR calc Af Amer: 9 mL/min — ABNORMAL LOW (ref >60)
GFR calc non Af Amer: 8 mL/min — ABNORMAL LOW (ref >60)
Glucose, Bld: 194 mg/dL — ABNORMAL HIGH (ref 70–99)
Potassium: 4 mmol/L (ref 3.5–5.1)
Sodium: 140 mmol/L (ref 135–145)

## 2019-12-15 LAB — PHOSPHORUS: Phosphorus: 4.9 mg/dL — ABNORMAL HIGH (ref 2.5–4.6)

## 2019-12-15 LAB — MAGNESIUM: Magnesium: 2 mg/dL (ref 1.7–2.4)

## 2019-12-15 LAB — GLUCOSE, CAPILLARY
Glucose-Capillary: 185 mg/dL — ABNORMAL HIGH (ref 70–99)
Glucose-Capillary: 219 mg/dL — ABNORMAL HIGH (ref 70–99)
Glucose-Capillary: 252 mg/dL — ABNORMAL HIGH (ref 70–99)
Glucose-Capillary: 214 mg/dL — ABNORMAL HIGH (ref 70–99)

## 2019-12-15 MED ORDER — MELATONIN 5 MG PO TABS
2.5000 mg | ORAL_TABLET | Freq: Every day | ORAL | Status: DC
  Administered 2019-12-16: 2.5 mg via ORAL
  Filled 2019-12-15: qty 0.5
  Filled 2019-12-15: qty 1

## 2019-12-15 NOTE — Progress Notes (Signed)
Progress note    Patient: Kerry [name]                   Admit date: 11/28/2019   DOB: 08/21/1933             Discharge date:12/15/2019/10:14 AM [id number]                          PCP: Kerry Sheerer, MD  Disposition: SNF   Recommendations for Outpatient Follow-up:   . Follow up: in 1 week  . Continue with hemodialysis as scheduled . Follow with nephrology . Palliative care consult  Discharge Condition: Stable   Code Status:   Code Status: Full Code  Diet recommendation: Regular healthy diet/   Pending transfer to SNF.  Trial of patient tolerating to set up for minimal 4 hours for hemodialysis He is due for hemodialysis today We will further evaluate still on 6 L of oxygen via trach, satting 90%  Patient was seen and examined, tolerated hemodialysis yesterday still patient has not been up to chair --nurse instruction was placed   Discharge Diagnoses:    Active Problems:   Anemia of chronic disease   CKD (chronic kidney disease) stage 4, GFR 15-29 ml/min (HCC)   Coronary artery disease   Heart failure with preserved ejection fraction (HCC)   History of tracheostomy   Hypertension   Type 2 diabetes mellitus ([place])   Cardiac arrest ([place])   History of Present Illness/ Hospital Course Kerry [name] Summary:   Patient with advanced CHF and COPD, three-vessel CAD, sleep apnea, diverticulosis, 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   Apparently patient lost pulse while having his the most recent dialysis session. Patient had also developed A. fib RVR and was apneic in the field and required bag mask  ventilation. After arrival to the ER patient again coded x2 with V. fib arrest status post ACLS and ROSC, found to be amiodarone and 20 MCG [place] bedside. He underwent ACLS x 3. He was placed on the arctic sun protocol.  The patient required mechanical ventilation via tracheostomy. He was placed on an amiodarone gtt. Triple lumen catheter was placed. TTE was performed and demonstrated EF 25-30%. 26% LVEF by PLAX. Grade 1 diastolic dysfunction. RV is mildly enlarged.   The patient remains on low dose amiodarone gtt. Pressors off this morning per nursing. Blood pressure have been stable since yesterday afternoon.   Subjective: The patient was seen and examined this morning, wife present at bedside  Tolerated hemodialysis yesterday and bed.   According to nursing staff and patient's wife patient has not been up to chair or any attempt for ambulation yet.        LOS: 16 days   A & P   Severe ACUTE Hypoxic and Hypercapnic Respiratory Failure:  -Stable no changes, ,- chronic tracheostomy,5-6 L of oxygen  via trach site neck Satting >95% With a history of laryngectomy   Laryngeal Carcinoma:  -Remained stable - chronic tracheostomy -PCCM, RT following, continue as needed nebs   Acute Systolic Cardiac Failure:  Stable still needing supplemental oxygen 5-4 L oxygen, satting greater than 92% EF 20%. Complicated by  Atrial fibrillation with RVR and ischemic cardiomyopathy.  SYSTOLIC CARDIAC FAILURE- EF20%. The patient continues to require IV amiodarone as he is still unable to take PO. No anticoagulation due to bleeding history and anemia. Cardiology does not feel that arrest was a primary ischemic event. Echo did not reveal changes in wall motion or EF.   Hypotension:  -BP stabilized improved -Home BP meds modified, DC Pt is off of pressors.  ESRD on HD:  -Nephrology following closely -Hemodialysis per nephrology -Medications are dosed renal -S/p  hemodialysis  12/14/2019    Cardiogenic Shock:  -Status post ICD management, patient has been stabilized off pressors -BP stabilized We will continue to monitor closely   Altered Mental Status:  Improved, at baseline now  Right arm edema, -Likely venous stasis -Ultrasound negative for DVT  Right IJ DVT, right cephalic vein SVT -Patient has been on heparin, will switch to Eliquis    DVT Prophylaxis: SCD's CODE STATUS: Full Code --- we recommend to continue palliative care follow-up for determining goals of care, CODE STATUS near future. Family Communication: Wife at bedside discussed in detail regarding goals of care, and palliative follow-up Disposition: From Home. Anticipate discharge to SNF/Rehab.   Dispo: To SNF -will not be able to take the patient unless he is approximately on 4 L of oxygen, and able to sit in chair for hemodialysis -Anticipating discharge to SNF in 1 to 2 days  Continue hemodialysis as scheduled -last hemodialysis today 12/12/2019 (Tuesday Thursday Saturday) Continue trach hygiene, with as needed suction Continue O2 via trach Continue palliative care discussion   Venous ultrasound  IMPRESSION: 1. The examination is positive for age-indeterminate occlusive DVT within the right internal jugular vein. Given history of end-stage renal disease, this could represent the sequela of previous dialysis catheter placement. Clinical correlation is advised. 2. The examination is positive for occlusive SVT involving the right cephalic vein, age indeterminate though given suspected calcifications, is favored to be chronic in etiology.    Consultants   Cardiology  Nephrology  PCCM  Procedures   Dialysis  Mechanical ventilation        Nutritional status:  Nutrition Problem: Increased nutrient needs Etiology: catabolic illness (CHF, COPD, ESRD on HD) Signs/Symptoms: estimated needs Interventions: Nepro shake,  MVI   Discharge Instructions:   Discharge Instructions    Activity as tolerated - No restrictions   Complete by: As directed    Activity as tolerated - No restrictions   Complete by: As directed    Call MD for:  difficulty breathing, headache or visual disturbances   Complete by: As directed    Call MD for:  persistant nausea and vomiting   Complete by: As directed    Call MD for:  temperature >100.4   Complete by: As directed    Diet - low sodium heart healthy   Complete by: As directed    Discharge instructions   Complete by: As directed    Follow-up at nursing facility, continue trach hygiene... With suction. Continue aggressive PT OT. Fall precaution May taper down O2 demand, keep O2 sat greater than 92%.   Discharge instructions   Complete by: As directed    Continue palliative discussion Continue modalities as scheduled   Increase activity slowly   Complete by: As directed    Increase activity slowly   Complete by: As directed    Remove dressing in 24 hours   Complete by: As directed    Remove dressing in 24 hours   Complete by:  As directed    Remove dressing in 48 hours   Complete by: As directed    Remove dressing in 48 hours   Complete by: As directed        Contact information for after-discharge care    Destination    [facility] Preferred SNF .   Service: Skilled Nursing Contact information: [place] [phone]                 Allergies  Allergen Reactions  . Ace Inhibitors Other (See Comments)    Potassium elevated  . Gabapentin Other (See Comments)    Ataxia at 100 mg.  . Losartan Other (See Comments)    Hyperkalemia     Procedures /Studies:   CT HEAD WO CONTRAST  Result Date: 11/28/2019 CLINICAL DATA:  Status post cardiac arrest. EXAM: CT HEAD WITHOUT CONTRAST TECHNIQUE: Contiguous axial images were obtained from the base of the skull through the vertex without intravenous  contrast. COMPARISON:  None. FINDINGS: Brain: There is mild cerebral atrophy with widening of the extra-axial spaces and ventricular dilatation. There are areas of decreased attenuation within the white matter tracts of the supratentorial brain, consistent with microvascular disease changes. Vascular: No hyperdense vessel or unexpected calcification. Skull: Normal. Negative for fracture or focal lesion. Sinuses/Orbits: No acute finding. Other: None. IMPRESSION: 1. Generalized cerebral atrophy. 2. No acute intracranial abnormality. Electronically Signed   By: Virgina Norfolk M.D.   On: 11/28/2019 18:06   CT ABDOMEN PELVIS W CONTRAST  Result Date: 12/06/2019 CLINICAL DATA:  Abdominal distention EXAM: CT ABDOMEN AND PELVIS WITH CONTRAST TECHNIQUE: Multidetector CT imaging of the abdomen and pelvis was performed using the standard protocol following bolus administration of intravenous contrast. CONTRAST:  171mL OMNIPAQUE IOHEXOL 300 MG/ML  SOLN COMPARISON:  None. FINDINGS: Lower chest: Moderate bilateral pleural effusions with compressive atelectasis in the lower lobes. Densely calcified visualized coronary arteries. Cardiomegaly. Hepatobiliary: Subtle layering high-density material within the gallbladder could reflect small stones or sludge/gravel. No focal hepatic abnormality. IMPRESSION: Moderate bilateral pleural effusions with compressive atelectasis in the lower lobes. Small kidneys bilaterally with poor excretion of contrast suggesting kidney disease. Aortic atherosclerosis. Small to moderate free fluid in the abdomen and pelvis. Diffuse anasarca like fluid throughout the subcutaneous soft tissues. Moderate stool burden in the colon.  Left colonic diverticulosis. Reflux of contrast from the right heart through the IVC into the hepatic veins and right renal vein suggesting right heart dysfunction. Electronically Signed   By: Rolm Baptise M.D.   On: 12/06/2019 18:45   US Venous Img Upper Uni  Right(DVT)  Result Date: 12/10/2019 CLINICAL DATA:  Right upper extremity pain and. History of end-stage renal disease. Former smoker. Evaluate for DVT. EXAM: RIGHT UPPER EXTREMITY VENOUS DOPPLER ULTRASOUND TECHNIQUE: Gray-scale sonography with graded compression, as well as color Doppler and duplex ultrasound were performed to evaluate the upper extremity deep venous system from the level of the subclavian vein and including the jugular, axillary, basilic, radial, ulnar and upper cephalic vein. Spectral Doppler was utilized to evaluate flow at rest and with distal augmentation maneuvers. COMPARISON:  Chest radiograph-01/01/2020 FINDINGS: Contralateral Subclavian Vein: Respiratory phasicity is normal and symmetric with the symptomatic side. No evidence of thrombus. Normal compressibility. Internal Jugular Vein: There is mixed echogenic occlusive thrombus within the right internal jugular vein (images 2 through 5). Subclavian Vein: No evidence of thrombus. Normal compressibility, respiratory phasicity and response to augmentation. Axillary Vein: No evidence of thrombus. Normal  compressibility, respiratory phasicity and response to augmentation. Cephalic Vein: There is mixed echogenic occlusive thrombus involving the right cephalic vein extending from the level of the distal forearm (image 29), through the antecubital fossa (image 30), to the proximal humerus (image 31). Note is made of echogenic calcifications involving the thrombus within the mid aspect of the cephalic vein (image 32). Basilic Vein: No evidence of thrombus. Normal compressibility, respiratory phasicity and response to augmentation. Brachial Veins: No evidence of thrombus. Normal compressibility, respiratory phasicity and response to augmentation. Radial Veins: No evidence of thrombus. Normal compressibility, respiratory phasicity and response to augmentation. Ulnar Veins: No evidence of thrombus. Normal compressibility, respiratory phasicity and  response to augmentation. Other Findings:  None visualized. IMPRESSION: 1. The examination is positive for age-indeterminate occlusive DVT within the right internal jugular vein. Given history of end-stage renal disease, this  ECHOCARDIOGRAM COMPLETE  Result Date: 11/30/2019    ECHOCARDIOGRAM REPORT   Patient Name:   Meldon Chambers Date of Exam: IMPRESSIONS  1. Left ventricular ejection fraction, by estimation, is 25 to 30%. Left ventricular ejection fraction by PLAX is 26 %. The left ventricle has severely decreased function. The left ventricle has no regional wall motion abnormalities. The left ventricular internal cavity size was moderately dilated. Left ventricular diastolic parameters are consistent with Grade I diastolic dysfunction (impaired relaxation).  2. Right ventricular systolic function is normal. The right ventricular size is mildly enlarged.  3. The mitral valve was not well visualized. Trivial mitral valve regurgitation.  4. The aortic valve was not well visualized. Aortic valve regurgitation is trivial. Mild aortic valve sclerosis is present, with no evidence of aortic valve stenosis. FINDINGS  Left Ventricle: Left ventricular ejection fraction, by estimation, is 25 to 30%. Left ventricular ejection fraction by PLAX is 26 %. The left ventricle has severely decreased function. The left ventricle has no regional wall motion abnormalities. Definity contrast agent was given IV to delineate the left ventricular endocardial borders. The left ventricular internal cavity size was moderately dilated. There is no left ventricular hypertrophy. Left ventricular diastolic parameters are consistent with Grade I diastolic dysfunction (impaired relaxation). Right Ventricle: The right ventricular size is mildly enlarged. No increase in right ventricular wall thickness. Right ventricular systolic function is normal. Left Atrium: Left atrial size was normal in size. Right Atrium: Right atrial size was normal in  size. Pericardium: There is no evidence of pericardial effusion. Mitral Valve: The mitral valve was not well visualized. Trivial mitral valve regurgitation. Tricuspid Valve: The tricuspid valve is not well visualized. Tricuspid valve regurgitation is mild. Aortic Valve:    Subjective:   Patient was seen and examined 12/15/2019, 10:14 AM Patient stable today. No acute distress.  No issues overnight Stable for discharge.  Discharge Exam:     Physical Exam  Patient was seen and examined, no changes Work of breathing improved with hemodialysis yesterday Constitution:  Alert, cooperative, no distress, remains on 5-6 L of oxygen via trach Psychiatric: Normal and stablect,   HEENT: Chronic trach, O2 via via trach normocephalic, PERRL, otherwise with in Normal limits  Chest:Chest symmetric Cardio vascular:  S1/S2, RRR, No murmure, No Rubs or Gallops  pulmonary: Clear to auscultation bilaterally, respirations unlabored, negative wheezes / crackles Abdomen: Soft, non-tender, non-distended, bowel sounds,no masses, no organomegaly Muscular skeletal:  Severe generalized weaknesses Limited exam - in bed, able to move all 4 extremities, Normal strength,  Neuro: CNII-XII intact. , normal motor and sensation, reflexes intact  Extremities: No pitting edema lower extremities, +2  pulses  Skin: Dry, warm to touch, negative for any Rashes, No open wounds Wounds: per nursing documentation,       The results of significant diagnostics from this hospitalization (including imaging, microbiology, ancillary and laboratory) are listed below for reference.      Microbiology:   Recent Results (from the past 240 hour(s))  SARS CORONAVIRUS 2 (TAT 6-24 HRS) Nasopharyngeal Nasopharyngeal Swab     Status: None   Collection Time: 12/13/19  6:15 PM   Specimen: Nasopharyngeal Swab  Result Value Ref Range Status   SARS Coronavirus 2 NEGATIVE NEGATIVE Final    Comment: (NOTE) SARS-CoV-2 target nucleic acids are  NOT DETECTED.  The Performed at [facility], [place] [address] [place], [place] [address]      Labs:   CBC: Recent Labs  Lab 12/09/19 [phone] 12/09/19 1113 12/10/19 0606 12/12/19 2106 12/14/19 1344  WBC RESULTS ENTERED IN ERROR 10.7* 11.3*  --  9.1  HGB TEST WILL BE CREDITED 10.3* 10.8* 10.1* 10.0*  HCT TEST WILL BE CREDITED 27.7* 28.9* 28.1* 27.6*  MCV TEST WILL BE CREDITED 87.7 87.0  --  88.5  PLT TEST WILL BE CREDITED 215 192  --  062   Basic Metabolic Panel: Recent Labs  Lab 12/09/19 1112 12/09/19 1112 12/10/19 0606 12/10/19 0606 12/11/19 0532 12/12/19 0539 12/13/19 0756 12/14/19 1344 12/15/19 0849  NA 134*  --  135  --   --   --   --  138 140  K 3.7  --  3.7  --   --   --   --  4.3 4.0  CL 92*  --  90*  --   --   --   --  96* 97*  CO2 25  --  24  --   --   --   --  28 28  GLUCOSE 226*  --  156*  --   --   --   --  192* 194*  BUN 68*  --  77*  --   --   --   --  59* 45*  CREATININE 7.60*  --  8.61*  --   --   --   --  7.68* 6.11*  CALCIUM 8.1*  --  8.1*  --   --   --   --  8.5* 8.5*  MG 2.2   < > 2.1   < > 2.0 2.2 2.1 2.2 2.0  PHOS 5.7*   < > 6.4*   < > 4.3 5.8* 5.2* 5.6* 4.9*   < > = values in this interval not displayed.   Liver Function Tests: No results for input(s): AST, ALT, ALKPHOS, BILITOT, PROT, ALBUMIN in the last 168 hours. BNP (last 3 results) Recent Labs    11/28/19 1331  BNP 4,339.6*   Cardiac Enzymes: No results for input(s): CKTOTAL, CKMB, CKMBINDEX, TROPONINI in the last 168 hours. CBG: Recent Labs  Lab 12/14/19 0814 12/14/19 1154 12/14/19 1629 12/14/19 2137 12/15/19 0757  GLUCAP 139* 177* 123* 193* 185*   Hgb Time coordinating discharge: Over 45 minutes  SIGNED: Deatra James, MD, FACP, FHM. [facility],  Please use [email or web address] to Page If 7PM-7AM, please contact night-coverage [email or web address], Password [facility] 12/15/2019, 10:14 AM

## 2019-12-15 NOTE — Progress Notes (Signed)
Patient restless in bed; Denies pain; requests sleep medication. Message sent to E. Ouma covering APP. Will await orders; reposition patient for comfort.

## 2019-12-15 NOTE — Progress Notes (Signed)
[facility]  ROUNDING NOTE   Subjective:  Patient resting comfortably in bed. Wife at bedside. Had dialysis yesterday.   Objective:  Vital signs in last 24 hours:  Temp:  [97.8 F (36.6 C)-98.9 F (37.2 C)] 98.6 F (37 C) (07/11 0900) Pulse Rate:  [63-65] 64 (07/11 1145) Resp:  [16-26] 25 (07/11 1145) BP: (115-132)/(44-70) 132/44 (07/11 1145) SpO2:  [46 %-98 %] 46 % (07/11 1145) FiO2 (%):  [28 %] 28 % (07/11 0900) Weight:  [68.6 kg] 68.6 kg (07/11 0220)  Weight change: -3.084 kg Filed Weights   12/13/19 0554 12/14/19 0502 12/15/19 0220  Weight: 70.3 kg 71.7 kg 68.6 kg    Intake/Output: I/O last 3 completed shifts: In: -  Out: 1500 [Other:1500]   Intake/Output this shift:  No intake/output data recorded.  Physical Exam: General:  No acute distress  HEENT  anicteric, moist oral mucous membrane, status post laryngectomy  Pulm/lungs  normal breathing effort, tracheostomy noted  CVS/Heart  no rub or gallop, irregular rhythm  Abdomen:   Soft, nontender  Extremities:  Trace lower extremity edema  Neurologic:  Alert, able to follow commands  Skin:  No acute rashes   Access: Left arm AVG    Basic Metabolic Panel: Recent Labs  Lab 12/09/19 1112 12/09/19 1112 12/10/19 0606 12/10/19 0606 12/11/19 0532 12/12/19 0539 12/13/19 0756 12/14/19 1344 12/15/19 0849  NA 134*  --  135  --   --   --   --  138 140  K 3.7  --  3.7  --   --   --   --  4.3 4.0  CL 92*  --  90*  --   --   --   --  96* 97*  CO2 25  --  24  --   --   --   --  28 28  GLUCOSE 226*  --  156*  --   --   --   --  192* 194*  BUN 68*  --  77*  --   --   --   --  59* 45*  CREATININE 7.60*  --  8.61*  --   --   --   --  7.68* 6.11*  CALCIUM 8.1*   < > 8.1*  --   --   --   --  8.5* 8.5*  MG 2.2   < > 2.1   < > 2.0 2.2 2.1 2.2 2.0  PHOS 5.7*   < > 6.4*   < > 4.3 5.8* 5.2* 5.6* 4.9*   < > = values in this interval not displayed.    Liver Function Tests: No results for input(s): AST, ALT,  ALKPHOS, BILITOT, PROT, ALBUMIN in the last 168 hours. No results for input(s): LIPASE, AMYLASE in the last 168 hours. No results for input(s): AMMONIA in the last 168 hours.  CBC: Recent Labs  Lab 12/09/19 0817 12/09/19 1113 12/10/19 0606 12/12/19 2106 12/14/19 1344  WBC RESULTS ENTERED IN ERROR 10.7* 11.3*  --  9.1  HGB TEST WILL BE CREDITED 10.3* 10.8* 10.1* 10.0*  HCT TEST WILL BE CREDITED 27.7* 28.9* 28.1* 27.6*  MCV TEST WILL BE CREDITED 87.7 87.0  --  88.5  PLT TEST WILL BE CREDITED 215 192  --  236    Cardiac Enzymes: No results for input(s): CKTOTAL, CKMB, CKMBINDEX, TROPONINI in the last 168 hours.  BNP: Invalid input(s): POCBNP  CBG: Recent Labs  Lab 12/14/19 1154 12/14/19 1629 12/14/19 2137  12/15/19 0757 12/15/19 1144  [place]*    Microbiology: Results for orders placed or performed during the hospital encounter of 11/28/19  Culture, blood (routine x 2)     Status: None   Collection Time: 11/28/19  1:31 PM   Specimen: BLOOD  Result Value Ref Range Status   Specimen Description BLOOD BLOOD RIGHT ARM  Final   Special Requests   Final    BOTTLES DRAWN AEROBIC AND ANAEROBIC Blood Culture adequate volume   Culture   Final    NO GROWTH 5 DAYS Performed at [facility], [address] [place], [place] [address]    Report Status 12/03/2019 FINAL  Final  Culture, blood (routine x 2)     Status: None   Collection Time: 11/28/19  2:17 PM   Specimen: BLOOD  Result Value Ref Range Status   Specimen Description BLOOD BLOOD RIGHT WRIST  Final   Special Requests   Final    BOTTLES DRAWN AEROBIC AND ANAEROBIC Blood Culture adequate volume   Culture   Final    NO GROWTH 5 DAYS Performed at [facility], [address], [place], [place] [address]    Report Status 12/03/2019 FINAL  Final  SARS Coronavirus 2 by RT PCR (hospital order, performed in [place] hospital lab) Nasopharyngeal Nasopharyngeal Swab     Status:  None   Collection Time: 11/28/19  4:40 PM   Specimen: Nasopharyngeal Swab  Result Value Ref Range Status   SARS Coronavirus 2 NEGATIVE NEGATIVE Final    Comment: (NOTE) SARS-CoV-2 target nucleic acids are NOT DETECTED.  The SARS-CoV-2 RNA is generally detectable in upper and lower respiratory specimens during the acute phase of infection. The lowest concentration of SARS-CoV-2 viral copies this assay can detect is 250 copies / mL. A negative result does not preclude SARS-CoV-2 infection and should not be used as the sole basis for treatment or other patient management decisions.  A negative result may occur with improper specimen collection / handling, submission of specimen other than nasopharyngeal swab, presence of viral mutation(s) within the areas targeted by this assay, and inadequate number of viral copies (<250 copies / mL). A negative result must be combined with clinical observations, patient history, and epidemiological information.  Fact Sheet for Patients:   StrictlyIdeas.no  Fact Sheet for Healthcare Providers: BankingDealers.co.za  This test is not yet approved or  cleared by the [place] FDA and has been authorized for detection and/or diagnosis of SARS-CoV-2 by FDA under an Emergency Use Authorization (EUA).  This EUA will remain in effect (meaning this test can be used) for the duration of the COVID-19 declaration under Section 564(b)(1) of the Act, 21 U.S.C. section 360bbb-3(b)(1), unless the authorization is terminated or revoked sooner.  Performed at [facility], [address] [place], [place] [address]   MRSA PCR Screening     Status: None   Collection Time: 11/28/19  9:08 PM   Specimen: Nasopharyngeal  Result Value Ref Range Status   MRSA by PCR NEGATIVE NEGATIVE Final    Comment:        The GeneXpert MRSA Assay (FDA approved for NASAL specimens only), is one component of  a comprehensive MRSA colonization surveillance program. It is not intended to diagnose MRSA infection nor to guide or monitor treatment for MRSA infections. Performed at [facility], [place], [name] [address]   SARS CORONAVIRUS 2 (TAT 6-24 HRS) Nasopharyngeal Nasopharyngeal Swab  Status: None   Collection Time: 12/13/19  6:15 PM   Specimen: Nasopharyngeal Swab  Result Value Ref Range Status   SARS Coronavirus 2 NEGATIVE NEGATIVE Final    Comment: (NOTE) SARS-CoV-2 target nucleic acids are NOT DETECTED.  The SARS-CoV-2 RNA is generally detectable in upper and lower respiratory specimens during the acute phase of infection. Negative results do not preclude SARS-CoV-2 infection, do not rule out co-infections with other pathogens, and should not be used as the sole basis for treatment or other patient management decisions. Negative results must be combined with clinical observations, patient history, and epidemiological information. The expected result is Negative.  Fact Sheet for Patients: SugarRoll.be  Fact Sheet for Healthcare Providers: https://www.woods-mathews.com/  This test is not yet approved or cleared by the [place] FDA and  has been authorized for detection and/or diagnosis of SARS-CoV-2 by FDA under an Emergency Use Authorization (EUA). This EUA will remain  in effect (meaning this test can be used) for the duration of the COVID-19 declaration under Se ction 564(b)(1) of the Act, 21 U.S.C. section 360bbb-3(b)(1), unless the authorization is terminated or revoked sooner.  Performed at [facility], [place] [address], [place], [place] [address]     Coagulation Studies: No results for input(s): LABPROT, INR in the last 72 hours.  Urinalysis: No results for input(s): COLORURINE, LABSPEC, PHURINE, GLUCOSEU, HGBUR, BILIRUBINUR, KETONESUR, PROTEINUR, UROBILINOGEN, NITRITE, LEUKOCYTESUR  in the last 72 hours.  Invalid input(s): APPERANCEUR    Imaging: No results found.   Medications:   . sodium chloride Stopped (12/06/19 1834)  . sodium chloride Stopped (11/28/19 2021)  . sodium chloride    . sodium chloride     . sodium chloride   Intravenous Once  . amiodarone  100 mg Oral Daily  . apixaban  10 mg Oral BID   Followed by  . [START ON 12/18/2019] apixaban  5 mg Oral BID  . chlorhexidine gluconate (MEDLINE KIT)  15 mL Mouth Rinse BID  . Chlorhexidine Gluconate Cloth  6 each Topical Q0600  . epoetin (EPOGEN/PROCRIT) injection  2,000 Units Intravenous Q T,Th,Sa-HD  . famotidine  20 mg Oral Daily  . feeding supplement (NEPRO CARB STEADY)  237 mL Oral BID BM  . insulin aspart  0-5 Units Subcutaneous QHS  . insulin aspart  0-6 Units Subcutaneous TID WC  . melatonin  2.5 mg Oral QHS  . midodrine  10 mg Oral TID WC  . multivitamin  1 tablet Oral QHS  . sodium chloride flush  10-40 mL Intracatheter Q12H   sodium chloride, sodium chloride, alteplase, bisacodyl, heparin, HYDROmorphone (DILAUDID) injection, lidocaine (PF), lidocaine-prilocaine, ondansetron (ZOFRAN) IV, pentafluoroprop-tetrafluoroeth, polyethylene glycol, sodium chloride flush  Assessment/ Plan:  Kerry [name] is a 86 y.o. 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 End-stage renal disease with volume overload:   Patient underwent dialysis yesterday.  No urgent indication for dialysis today.  Next Alysis treatment to be scheduled for Tuesday.  2. Hypokalemia:   Potassium 4.0 at last check and now normalized.  3.  Atrial fibrillation Most recent heart rate was 64 and acceptable.  4.  Anemia with chronic kidney disease: receives Mircera as outpatient.  PRBC transfusion 6/29.  -Hemoglobin at target.  Continue Epogen with dialysis treatments. Lab Results  Component Value Date   HGB 10.0 (L) 12/14/2019      LOS: 17 Munsoor Lateef 7/11/20214:21 PM

## 2019-12-16 ENCOUNTER — Encounter: Payer: Self-pay | Admitting: Nephrology

## 2019-12-16 DIAGNOSIS — J9621 Acute and chronic respiratory failure with hypoxia: Secondary | ICD-10-CM

## 2019-12-16 DIAGNOSIS — I82621 Acute embolism and thrombosis of deep veins of right upper extremity: Secondary | ICD-10-CM

## 2019-12-16 DIAGNOSIS — I959 Hypotension, unspecified: Secondary | ICD-10-CM

## 2019-12-16 DIAGNOSIS — I5021 Acute systolic (congestive) heart failure: Secondary | ICD-10-CM

## 2019-12-16 DIAGNOSIS — N186 End stage renal disease: Secondary | ICD-10-CM

## 2019-12-16 DIAGNOSIS — I480 Paroxysmal atrial fibrillation: Secondary | ICD-10-CM

## 2019-12-16 DIAGNOSIS — I5022 Chronic systolic (congestive) heart failure: Secondary | ICD-10-CM

## 2019-12-16 DIAGNOSIS — J9622 Acute and chronic respiratory failure with hypercapnia: Secondary | ICD-10-CM

## 2019-12-16 LAB — BASIC METABOLIC PANEL
Anion gap: 12 (ref 5–15)
BUN: 59 mg/dL — ABNORMAL HIGH (ref 8–23)
CO2: 27 mmol/L (ref 22–32)
Calcium: 8.5 mg/dL — ABNORMAL LOW (ref 8.9–10.3)
Chloride: 98 mmol/L (ref 98–111)
Creatinine, Ser: 7.01 mg/dL — ABNORMAL HIGH (ref 0.61–1.24)
GFR calc Af Amer: 8 mL/min — ABNORMAL LOW (ref >60)
GFR calc non Af Amer: 7 mL/min — ABNORMAL LOW (ref >60)
Glucose, Bld: 152 mg/dL — ABNORMAL HIGH (ref 70–99)
Potassium: 4.2 mmol/L (ref 3.5–5.1)
Sodium: 137 mmol/L (ref 135–145)

## 2019-12-16 LAB — MAGNESIUM: Magnesium: 2 mg/dL (ref 1.7–2.4)

## 2019-12-16 LAB — GLUCOSE, CAPILLARY
Glucose-Capillary: 130 mg/dL — ABNORMAL HIGH (ref 70–99)
Glucose-Capillary: 190 mg/dL — ABNORMAL HIGH (ref 70–99)
Glucose-Capillary: 185 mg/dL — ABNORMAL HIGH (ref 70–99)

## 2019-12-16 LAB — PHOSPHORUS: Phosphorus: 5.4 mg/dL — ABNORMAL HIGH (ref 2.5–4.6)

## 2019-12-16 MED ORDER — EPOETIN ALFA 4000 UNIT/ML IJ SOLN
2000.0000 [IU] | INTRAMUSCULAR | Status: DC
  Administered 2019-12-17: 2000 [IU] via SUBCUTANEOUS
  Filled 2019-12-16: qty 1

## 2019-12-16 NOTE — Progress Notes (Signed)
Patient ID: Kerry [name], male   DOB: Jun 24, 1933, 86 y.o.   MRN: [id number] [facility] PROGRESS NOTE  Thailan Sava NUU:[id number] DOB: September 17, 1933 DOA: 11/28/2019 PCP: Cecile Sheerer, MD  HPI/Subjective: Patient feeling wiped out today.  No complaints of chest pain or shortness of breath.  No abdominal pain.  Tolerated breakfast.  No diarrhea or constipation.  Shunt was admitted with a ventricular fibrillation cardiac arrest on 11/28/2019.  Objective: Vitals:   12/16/19 1100 12/16/19 1113  BP: (!) 130/58 112/65  Pulse:    Resp: 20   Temp: 98.6 F (37 C)   SpO2:      Intake/Output Summary (Last 24 hours) at 12/16/2019 1455 Last data filed at 12/16/2019 1350 Gross per 24 hour  Intake 480 ml  Output --  Net 480 ml   Filed Weights   12/14/19 0502 12/15/19 0220 12/16/19 0354  Weight: 71.7 kg 68.6 kg 70.8 kg    ROS: Review of Systems  Constitutional: Positive for malaise/fatigue.  Respiratory: Negative for shortness of breath.   Cardiovascular: Negative for chest pain.  Gastrointestinal: Negative for abdominal pain.  Musculoskeletal: Negative for back pain.   Exam: Physical Exam HENT:     Nose: No mucosal edema.     Mouth/Throat:     Pharynx: No oropharyngeal exudate.  Eyes:     General: Lids are normal.     Conjunctiva/sclera: Conjunctivae normal.     Pupils: Pupils are equal, round, and reactive to light.  Cardiovascular:     Rate and Rhythm: Normal rate and regular rhythm.     Heart sounds: S1 normal and S2 normal.  Pulmonary:     Breath sounds: Examination of the right-lower field reveals decreased breath sounds. Examination of the left-lower field reveals decreased breath sounds. Decreased breath sounds present. No wheezing, rhonchi or rales.  Abdominal:     Palpations: Abdomen is soft.     Tenderness: There is no abdominal tenderness.  Musculoskeletal:     Right ankle: No swelling.     Left ankle: No swelling.  Skin:    General: Skin is warm.      Findings: No rash.  Neurological:     Mental Status: He is alert.     Comments: Patient able to straight leg raise       Data Reviewed: Basic Metabolic Panel: Recent Labs  Lab 12/10/19 0606 12/11/19 0532 12/12/19 0539 12/13/19 0756 12/14/19 1344 12/15/19 0849 12/16/19 0848  NA 135  --   --   --  138 140 137  K 3.7  --   --   --  4.3 4.0 4.2  CL 90*  --   --   --  96* 97* 98  CO2 24  --   --   --  28 28 27   GLUCOSE 156*  --   --   --  192* 194* 152*  BUN 77*  --   --   --  59* 45* 59*  CREATININE 8.61*  --   --   --  7.68* 6.11* 7.01*  CALCIUM 8.1*  --   --   --  8.5* 8.5* 8.5*  MG 2.1   < > 2.2 2.1 2.2 2.0 2.0  PHOS 6.4*   < > 5.8* 5.2* 5.6* 4.9* 5.4*   < > = values in this interval not displayed.   CBC: Recent Labs  Lab 12/10/19 0606 12/12/19 2106 12/14/19 1344  WBC 11.3*  --  9.1  HGB 10.8* 10.1* 10.0*  HCT 28.9* 28.1* 27.6*  MCV 87.0  --  88.5  PLT 192  --  236   BNP (last 3 results) Recent Labs    11/28/19 1331  BNP 4,339.6*    CBG: Recent Labs  Lab 12/15/19 1144 12/15/19 1647 12/15/19 2058 12/16/19 0755 12/16/19 1123  GLUCAP 219* 252* 214* 130* 190*    Recent Results (from the past 240 hour(s))  SARS CORONAVIRUS 2 (TAT 6-24 HRS) Nasopharyngeal Nasopharyngeal Swab     Status: None   Collection Time: 12/13/19  6:15 PM   Specimen: Nasopharyngeal Swab  Result Value Ref Range Status   SARS Coronavirus 2 NEGATIVE NEGATIVE Final    Comment: (NOTE) SARS-CoV-2 target nucleic acids are NOT DETECTED.  The SARS-CoV-2 RNA is generally detectable in upper and lower respiratory specimens during the acute phase of infection. Negative results do not preclude SARS-CoV-2 infection, do not rule out co-infections with other pathogens, and should not be used as the sole basis for treatment or other patient management decisions. Negative results must be combined with clinical observations, patient history, and epidemiological information. The  expected result is Negative.  Fact Sheet for Patients: SugarRoll.be  Fact Sheet for Healthcare Providers: https://www.woods-mathews.com/  This test is not yet approved or cleared by the [place] FDA and  has been authorized for detection and/or diagnosis of SARS-CoV-2 by FDA under an Emergency Use Authorization (EUA). This EUA will remain  in effect (meaning this test can be used) for the duration of the COVID-19 declaration under Se ction 564(b)(1) of the Act, 21 U.S.C. section 360bbb-3(b)(1), unless the authorization is terminated or revoked sooner.  Performed at [facility], [place] [address], [place], [place] [address]      Scheduled Meds: . sodium chloride   Intravenous Once  . amiodarone  100 mg Oral Daily  . apixaban  10 mg Oral BID   Followed by  . [START ON 12/18/2019] apixaban  5 mg Oral BID  . chlorhexidine gluconate (MEDLINE KIT)  15 mL Mouth Rinse BID  . Chlorhexidine Gluconate Cloth  6 each Topical Q0600  . epoetin (EPOGEN/PROCRIT) injection  2,000 Units Intravenous Q T,Th,Sa-HD  . famotidine  20 mg Oral Daily  . feeding supplement (NEPRO CARB STEADY)  237 mL Oral BID BM  . insulin aspart  0-5 Units Subcutaneous QHS  . insulin aspart  0-6 Units Subcutaneous TID WC  . melatonin  2.5 mg Oral QHS  . midodrine  10 mg Oral TID WC  . multivitamin  1 tablet Oral QHS  . sodium chloride flush  10-40 mL Intracatheter Q12H   Continuous Infusions: . sodium chloride Stopped (12/06/19 1834)  . sodium chloride Stopped (11/28/19 2021)  . sodium chloride    . sodium chloride      Assessment/Plan:  1. Acute hypoxic and hypercapnic respiratory failure, likely chronic.  Patient on 5 L trach collar with good saturations.  Unable to come off oxygen today because his pulse ox dropped down to 86%. 2. Ventricular fibrillation arrest.  Cardiology recommending conservative management for right now.  Follow-up with cardiology as  outpatient.  Patient on amiodarone. 3. DVT right internal jugular vein started on Eliquis 4. Acute systolic congestive heart failure with EF of 20%.  Dialysis to manage fluid limited with other medications secondary to hypotension 5. Hypotension on midodrine 6. End-stage renal disease on hemodialysis Tuesday Thursday Saturday.  Needs to tolerate dialysis in a chair prior to disposition 7. Acute metabolic encephalopathy this has improved 8. History  of laryngeal carcinoma status post tracheostomy with stoma on trach collar 9. Paroxysmal atrial fibrillation and required thrombophilia.  Patient on Eliquis for anticoagulation to reduce stroke risk.  Please see discharge summary dictated on 12/13/2019 for hospital course up to that point  Code Status:     Code Status Orders  (From admission, onward)         Start     Ordered   11/28/19 1937  Full code  Continuous        11/28/19 1944        Code Status History    Date Active Date Inactive Code Status Order ID Comments User Context   11/28/2019 1642 11/28/2019 1944 Full Code [id number]  Ottie Glazier, MD ED   Advance Care Planning Activity     Family Communication: Wife at the bedside Disposition Plan: Status is: Inpatient  Dispo: The patient is from: Home              Anticipated d/c is to: Rehab              Anticipated d/c date is: Potentially 12/17/2019 if tolerates dialysis sitting up for the entire session.              Patient currently nearing the end of the hospital course.  Needs to tolerate dialysis sitting up prior to disposition.  Next dialysis session tomorrow.  Consultants:  Nephrology  Time spent: 28 minutes  [place]

## 2019-12-16 NOTE — TOC Progression Note (Signed)
Transition of Care [facility]) - Progression Note    Patient Details  Name: Damire Remedios MRN: [id number] Date of Birth: 16-Jan-1934  Transition of Care [facility]) CM/SW Contact  Meriel Flavors, LCSW Phone Number: 12/16/2019, 12:33 PM  Clinical Narrative:    TOC met with patient and spouse to confirm d/c plan to [facility]. CSW called Gerald Stabs at [facility] to confirm 5L oxygen is okay. The only barrier for [facility] at this time is making sure the patient can tolerate sitting in chair for dialysis. He is scheduled for dyalysis tomorrow and if that goes well he will D/C     Barriers to Discharge: Barriers Resolved  Expected Discharge Plan and Services           Expected Discharge Date: 12/14/19                                     Social Determinants of Health (SDOH) Interventions    Readmission Risk Interventions No flowsheet data found.

## 2019-12-16 NOTE — Care Management Important Message (Signed)
Important Message  Patient Details  Name: Kerry [name] MRN: [id number] Date of Birth: 24-Jul-1933   Medicare Important Message Given:  Yes     Dannette Barbara 12/16/2019, 12:08 PM

## 2019-12-16 NOTE — Progress Notes (Signed)
[facility]  ROUNDING NOTE   Subjective:  Patient seen and evaluated at bedside. Due for dialysis tomorrow again. Needs to be sitting up for dialysis treatment tomorrow. Still requiring significant amounts of oxygen.   Objective:  Vital signs in last 24 hours:  Temp:  [97.8 F (36.6 C)-98.6 F (37 C)] 98.6 F (37 C) (07/12 1100) Pulse Rate:  [61-65] 61 (07/12 0354) Resp:  [19-21] 20 (07/12 1100) BP: (112-130)/(51-67) 112/65 (07/12 1113) SpO2:  [87 %-100 %] 93 % (07/12 1030) FiO2 (%):  [28 %] 28 % (07/12 1030) Weight:  [70.8 kg] 70.8 kg (07/12 0354)  Weight change: 2.216 kg Filed Weights   12/14/19 0502 12/15/19 0220 12/16/19 0354  Weight: 71.7 kg 68.6 kg 70.8 kg    Intake/Output: No intake/output data recorded.   Intake/Output this shift:  Total I/O In: 480 [P.O.:480] Out: -   Physical Exam: General:  No acute distress  HEENT  anicteric, moist oral mucous membrane, status post laryngectomy  Pulm/lungs  normal breathing effort, tracheostomy noted  CVS/Heart  no rub or gallop, irregular rhythm  Abdomen:   Soft, nontender  Extremities:  Trace lower extremity edema  Neurologic:  Alert, able to follow commands  Skin:  No acute rashes   Access: Left arm AVG    Basic Metabolic Panel: Recent Labs  Lab 12/10/19 0606 12/10/19 0606 12/11/19 0532 12/12/19 0539 12/13/19 0756 12/14/19 1344 12/15/19 0849 12/16/19 0848  NA 135  --   --   --   --  138 140 137  K 3.7  --   --   --   --  4.3 4.0 4.2  CL 90*  --   --   --   --  96* 97* 98  CO2 24  --   --   --   --  _0 GLUCOSE 156*  --   --   --   --  192* 194* 152*  BUN 77*  --   --   --   --  59* 45* 59*  CREATININE 8.61*  --   --   --   --  7.68* 6.11* 7.01*  CALCIUM 8.1*   < >  --   --   --  8.5* 8.5* 8.5*  MG 2.1  --    < > 2.2 2.1 2.2 2.0 2.0  PHOS 6.4*  --    < > 5.8* 5.2* 5.6* 4.9* 5.4*   < > = values in this interval not displayed.    Liver Function Tests: No results for input(s):  AST, ALT, ALKPHOS, BILITOT, PROT, ALBUMIN in the last 168 hours. No results for input(s): LIPASE, AMYLASE in the last 168 hours. No results for input(s): AMMONIA in the last 168 hours.  CBC: Recent Labs  Lab 12/10/19 0606 12/12/19 2106 12/14/19 1344  WBC 11.3*  --  9.1  HGB 10.8* 10.1* 10.0*  HCT 28.9* 28.1* 27.6*  MCV 87.0  --  88.5  PLT 192  --  236    Cardiac Enzymes: No results for input(s): CKTOTAL, CKMB, CKMBINDEX, TROPONINI in the last 168 hours.  BNP: Invalid input(s): POCBNP  CBG: Recent Labs  Lab 12/15/19 1144 12/15/19 1647 12/15/19 2058 12/16/19 0755 12/16/19 1123  GLUCAP 219* 252* 214* 130* 190*    Microbiology: Results for orders placed or performed during the hospital encounter of 11/28/19  Culture, blood (routine x 2)     Status: None   Collection Time: 11/28/19  1:31 PM  Specimen: BLOOD  Result Value Ref Range Status   Specimen Description BLOOD BLOOD RIGHT ARM  Final   Special Requests   Final    BOTTLES DRAWN AEROBIC AND ANAEROBIC Blood Culture adequate volume   Culture   Final    NO GROWTH 5 DAYS Performed at [facility], [address], [place], [place] [address]    Report Status 12/03/2019 FINAL  Final  Culture, blood (routine x 2)     Status: None   Collection Time: 11/28/19  2:17 PM   Specimen: BLOOD  Result Value Ref Range Status   Specimen Description BLOOD BLOOD RIGHT WRIST  Final   Special Requests   Final    BOTTLES DRAWN AEROBIC AND ANAEROBIC Blood Culture adequate volume   Culture   Final    NO GROWTH 5 DAYS Performed at [facility], [address], [place], [place] [address]    Report Status 12/03/2019 FINAL  Final  SARS Coronavirus 2 by RT PCR (hospital order, performed in [facility] hospital lab) Nasopharyngeal Nasopharyngeal Swab     Status: None   Collection Time: 11/28/19  4:40 PM   Specimen: Nasopharyngeal Swab  Result Value Ref Range Status   SARS Coronavirus 2 NEGATIVE NEGATIVE Final     Comment: (NOTE) SARS-CoV-2 target nucleic acids are NOT DETECTED.  The SARS-CoV-2 RNA is generally detectable in upper and lower respiratory specimens during the acute phase of infection. The lowest concentration of SARS-CoV-2 viral copies this assay can detect is 250 copies / mL. A negative result does not preclude SARS-CoV-2 infection and should not be used as the sole basis for treatment or other patient management decisions.  A negative result may occur with improper specimen collection / handling, submission of specimen other than nasopharyngeal swab, presence of viral mutation(s) within the areas targeted by this assay, and inadequate number of viral copies (<250 copies / mL). A negative result must be combined with clinical observations, patient history, and epidemiological information.  Fact Sheet for Patients:   StrictlyIdeas.no  Fact Sheet for Healthcare Providers: BankingDealers.co.za  This test is not yet approved or  cleared by the [place] FDA and has been authorized for detection and/or diagnosis of SARS-CoV-2 by FDA under an Emergency Use Authorization (EUA).  This EUA will remain in effect (meaning this test can be used) for the duration of the COVID-19 declaration under Section 564(b)(1) of the Act, 21 U.S.C. section 360bbb-3(b)(1), unless the authorization is terminated or revoked sooner.  Performed at [facility], [address] [place], [place] [address]   MRSA PCR Screening     Status: None   Collection Time: 11/28/19  9:08 PM   Specimen: Nasopharyngeal  Result Value Ref Range Status   MRSA by PCR NEGATIVE NEGATIVE Final    Comment:        The GeneXpert MRSA Assay (FDA approved for NASAL specimens only), is one component of a comprehensive MRSA colonization surveillance program. It is not intended to diagnose MRSA infection nor to guide or monitor treatment for MRSA  infections. Performed at [facility], [place], [place] [address]   SARS CORONAVIRUS 2 (TAT 6-24 HRS) Nasopharyngeal Nasopharyngeal Swab     Status: None   Collection Time: 12/13/19  6:15 PM   Specimen: Nasopharyngeal Swab  Result Value Ref Range Status   SARS Coronavirus 2 NEGATIVE NEGATIVE Final    Comment: (NOTE) SARS-CoV-2 target nucleic acids are NOT DETECTED.  The SARS-CoV-2 RNA is generally  detectable in upper and lower respiratory specimens during the acute phase of infection. Negative results do not preclude SARS-CoV-2 infection, do not rule out co-infections with other pathogens, and should not be used as the sole basis for treatment or other patient management decisions. Negative results must be combined with clinical observations, patient history, and epidemiological information. The expected result is Negative.  Fact Sheet for Patients: SugarRoll.be  Fact Sheet for Healthcare Providers: https://www.woods-mathews.com/  This test is not yet approved or cleared by the [place] FDA and  has been authorized for detection and/or diagnosis of SARS-CoV-2 by FDA under an Emergency Use Authorization (EUA). This EUA will remain  in effect (meaning this test can be used) for the duration of the COVID-19 declaration under Se ction 564(b)(1) of the Act, 21 U.S.C. section 360bbb-3(b)(1), unless the authorization is terminated or revoked sooner.  Performed at [facility], [place] [address] [place], [place] [address]     Coagulation Studies: No results for input(s): LABPROT, INR in the last 72 hours.  Urinalysis: No results for input(s): COLORURINE, LABSPEC, PHURINE, GLUCOSEU, HGBUR, BILIRUBINUR, KETONESUR, PROTEINUR, UROBILINOGEN, NITRITE, LEUKOCYTESUR in the last 72 hours.  Invalid input(s): APPERANCEUR    Imaging: No results found.   Medications:   . sodium chloride Stopped (12/06/19 1834)   . sodium chloride Stopped (11/28/19 2021)  . sodium chloride    . sodium chloride     . sodium chloride   Intravenous Once  . amiodarone  100 mg Oral Daily  . apixaban  10 mg Oral BID   Followed by  . [START ON 12/18/2019] apixaban  5 mg Oral BID  . chlorhexidine gluconate (MEDLINE KIT)  15 mL Mouth Rinse BID  . Chlorhexidine Gluconate Cloth  6 each Topical Q0600  . epoetin (EPOGEN/PROCRIT) injection  2,000 Units Intravenous Q T,Th,Sa-HD  . famotidine  20 mg Oral Daily  . feeding supplement (NEPRO CARB STEADY)  237 mL Oral BID BM  . insulin aspart  0-5 Units Subcutaneous QHS  . insulin aspart  0-6 Units Subcutaneous TID WC  . melatonin  2.5 mg Oral QHS  . midodrine  10 mg Oral TID WC  . multivitamin  1 tablet Oral QHS  . sodium chloride flush  10-40 mL Intracatheter Q12H   sodium chloride, sodium chloride, alteplase, bisacodyl, heparin, HYDROmorphone (DILAUDID) injection, lidocaine (PF), lidocaine-prilocaine, ondansetron (ZOFRAN) IV, pentafluoroprop-tetrafluoroeth, polyethylene glycol, sodium chloride flush  Assessment/ Plan:  Kerry [name] is a 86 y.o. 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 End-stage renal disease with volume overload:   Patient due for dialysis treatment again tomorrow.  Orders to be prepared.  2. Hypokalemia:  Potassium remains within the normal range at 4.2.  Continue to monitor.  3.  Atrial fibrillation Heart rate currently sixty-one and within target range.  4. Anemia with chronic kidney disease: receives  Mircera as outpatient.  PRBC transfusion 6/29.  -Hemoglobin at target.  Continue Epogen with dialysis treatments. Lab Results  Component Value Date   HGB 10.0 (L) 12/14/2019      LOS: 18 Mayara Paulson 7/12/20213:27 PM

## 2019-12-17 DIAGNOSIS — D638 Anemia in other chronic diseases classified elsewhere: Secondary | ICD-10-CM

## 2019-12-17 LAB — BASIC METABOLIC PANEL
Anion gap: 13 (ref 5–15)
BUN: 76 mg/dL — ABNORMAL HIGH (ref 8–23)
CO2: 28 mmol/L (ref 22–32)
Calcium: 8.6 mg/dL — ABNORMAL LOW (ref 8.9–10.3)
Chloride: 98 mmol/L (ref 98–111)
Creatinine, Ser: 7.65 mg/dL — ABNORMAL HIGH (ref 0.61–1.24)
GFR calc Af Amer: 7 mL/min — ABNORMAL LOW (ref >60)
GFR calc non Af Amer: 6 mL/min — ABNORMAL LOW (ref >60)
Glucose, Bld: 192 mg/dL — ABNORMAL HIGH (ref 70–99)
Potassium: 4.4 mmol/L (ref 3.5–5.1)
Sodium: 139 mmol/L (ref 135–145)

## 2019-12-17 LAB — CBC
HCT: 25.7 % — ABNORMAL LOW (ref 39.0–52.0)
Hemoglobin: 9.4 g/dL — ABNORMAL LOW (ref 13.0–17.0)
MCH: 32.5 pg (ref 26.0–34.0)
MCHC: 36.6 g/dL — ABNORMAL HIGH (ref 30.0–36.0)
MCV: 88.9 fL (ref 80.0–100.0)
Platelets: 229 10*3/uL (ref 150–400)
RBC: 2.89 MIL/uL — ABNORMAL LOW (ref 4.22–5.81)
RDW: 17.8 % — ABNORMAL HIGH (ref 11.5–15.5)
WBC: 7 10*3/uL (ref 4.0–10.5)
nRBC: 0 % (ref 0.0–0.2)

## 2019-12-17 LAB — GLUCOSE, CAPILLARY
Glucose-Capillary: 177 mg/dL — ABNORMAL HIGH (ref 70–99)
Glucose-Capillary: 130 mg/dL — ABNORMAL HIGH (ref 70–99)
Glucose-Capillary: 236 mg/dL — ABNORMAL HIGH (ref 70–99)

## 2019-12-17 LAB — PHOSPHORUS: Phosphorus: 5.1 mg/dL — ABNORMAL HIGH (ref 2.5–4.6)

## 2019-12-17 LAB — MAGNESIUM: Magnesium: 2 mg/dL (ref 1.7–2.4)

## 2019-12-17 MED ORDER — MELATONIN 5 MG PO TABS: 2.5000 mg | ORAL_TABLET | Freq: Every day | ORAL | 0 refills | Status: AC

## 2019-12-17 MED ORDER — APIXABAN 5 MG PO TABS: ORAL_TABLET | ORAL | 0 refills | Status: AC

## 2019-12-17 MED ORDER — POLYETHYLENE GLYCOL 3350 17 G PO PACK: 17.0000 g | PACK | Freq: Every day | ORAL | 0 refills | Status: AC | PRN

## 2019-12-17 NOTE — Progress Notes (Signed)
PT Cancellation Note  Patient Details Name: Kerry [name] MRN: [id number] DOB: [birth year]   Cancelled Treatment:    Reason Eval/Treat Not Completed: Patient at procedure or test/unavailable   Pt at dialysis this afternoon.  Will continue as appropriate.   Chesley Noon 12/17/2019, 1:08 PM

## 2019-12-17 NOTE — Progress Notes (Signed)
EMS responded to patients bedside to transport patient to outside facility. EMS obtained patient's vitals & was not able to obtain an O2 sat reading. Nurse attempted to obtain an O2 sat by warming pt's hands, repositioning sensor. EMS declined to transport patient due to O2 reading. EMS left the unit and said to call when O2 reading was WNL. Nurse went to the ICU obtained an ear sensor and placed on patient. O2 reading 100%. EMS notified to transport patient to outside facility.

## 2019-12-17 NOTE — Progress Notes (Signed)
This note also relates to the following rows which could not be included: Pulse Rate - Cannot attach notes to unvalidated device data Resp - Cannot attach notes to unvalidated device data BP - Cannot attach notes to unvalidated device data SpO2 - Cannot attach notes to unvalidated device data  HD complete.

## 2019-12-17 NOTE — Progress Notes (Signed)
[facility]  ROUNDING NOTE   Subjective:  Patient due for HD today. Breathing comfortably.    Objective:  Vital signs in last 24 hours:  Temp:  [98 F (36.7 C)-98.6 F (37 C)] 98 F (36.7 C) (07/13 0940) Pulse Rate:  [63-66] 66 (07/13 0722) Resp:  [17-26] 22 (07/13 0722) BP: (101-139)/(31-87) 110/31 (07/13 0722) SpO2:  [93 %-96 %] 94 % (07/13 0722) FiO2 (%):  [28 %] 28 % (07/12 1743) Weight:  [73.2 kg-75.5 kg] 75.5 kg (07/13 0405)  Weight change: 2.411 kg Filed Weights   12/16/19 0354 12/17/19 0403 12/17/19 0405  Weight: 70.8 kg 73.2 kg 75.5 kg    Intake/Output: I/O last 3 completed shifts: In: 600 [P.O.:600] Out: 0    Intake/Output this shift:  Total I/O In: 120 [P.O.:120] Out: 0   Physical Exam: General:  No acute distress  HEENT  anicteric, moist oral mucous membrane, status post laryngectomy  Pulm/lungs  normal breathing effort, tracheostomy noted  CVS/Heart  no rub or gallop, irregular rhythm  Abdomen:   Soft, nontender  Extremities:  Trace lower extremity edema  Neurologic:  Alert, able to follow commands  Skin:  No acute rashes   Access: Left arm AVG    Basic Metabolic Panel: Recent Labs  Lab 12/13/19 0756 12/14/19 1344 12/14/19 1344 12/15/19 0849 12/16/19 0848 12/17/19 0526  NA  --  138  --  140 137 139  K  --  4.3  --  4.0 4.2 4.4  CL  --  96*  --  97* 98 98  CO2  --  28  --  28 27 28   GLUCOSE  --  192*  --  194* 152* 192*  BUN  --  59*  --  45* 59* 76*  CREATININE  --  7.68*  --  6.11* 7.01* 7.65*  CALCIUM  --  8.5*   < > 8.5* 8.5* 8.6*  MG 2.1 2.2  --  2.0 2.0 2.0  PHOS 5.2* 5.6*  --  4.9* 5.4* 5.1*   < > = values in this interval not displayed.    Liver Function Tests: No results for input(s): AST, ALT, ALKPHOS, BILITOT, PROT, ALBUMIN in the last 168 hours. No results for input(s): LIPASE, AMYLASE in the last 168 hours. No results for input(s): AMMONIA in the last 168 hours.  CBC: Recent Labs  Lab 12/12/19 2106  12/14/19 1344 12/17/19 0526  WBC  --  9.1 7.0  HGB 10.1* 10.0* 9.4*  HCT 28.1* 27.6* 25.7*  MCV  --  88.5 88.9  PLT  --  236 229    Cardiac Enzymes: No results for input(s): CKTOTAL, CKMB, CKMBINDEX, TROPONINI in the last 168 hours.  BNP: Invalid input(s): POCBNP  CBG: Recent Labs  Lab 12/15/19 2058 12/16/19 0755 12/16/19 1123 12/16/19 2110 12/17/19 0723  GLUCAP 214* 130* 190* 185* 177*    Microbiology: Results for orders placed or performed during the hospital encounter of 11/28/19  Culture, blood (routine x 2)     Status: None   Collection Time: 11/28/19  1:31 PM   Specimen: BLOOD  Result Value Ref Range Status   Specimen Description BLOOD BLOOD RIGHT ARM  Final   Special Requests   Final    BOTTLES DRAWN AEROBIC AND ANAEROBIC Blood Culture adequate volume   Culture   Final    NO GROWTH 5 DAYS Performed at [facility], [address], [place], [place] [address]    Report Status 12/03/2019 FINAL  Final  Culture, blood (routine x 2)     Status: None   Collection Time: 11/28/19  2:17 PM   Specimen: BLOOD  Result Value Ref Range Status   Specimen Description BLOOD BLOOD RIGHT WRIST  Final   Special Requests   Final    BOTTLES DRAWN AEROBIC AND ANAEROBIC Blood Culture adequate volume   Culture   Final    NO GROWTH 5 DAYS Performed at [facility], [address], [place], [place] [address]    Report Status 12/03/2019 FINAL  Final  SARS Coronavirus 2 by RT PCR (hospital order, performed in [facility] hospital lab) Nasopharyngeal Nasopharyngeal Swab     Status: None   Collection Time: 11/28/19  4:40 PM   Specimen: Nasopharyngeal Swab  Result Value Ref Range Status   SARS Coronavirus 2 NEGATIVE NEGATIVE Final    Comment: (NOTE) SARS-CoV-2 target nucleic acids are NOT DETECTED.  The SARS-CoV-2 RNA is generally detectable in upper and lower respiratory specimens during the acute phase of infection. The lowest concentration of SARS-CoV-2  viral copies this assay can detect is 250 copies / mL. A negative result does not preclude SARS-CoV-2 infection and should not be used as the sole basis for treatment or other patient management decisions.  A negative result may occur with improper specimen collection / handling, submission of specimen other than nasopharyngeal swab, presence of viral mutation(s) within the areas targeted by this assay, and inadequate number of viral copies (<250 copies / mL). A negative result must be combined with clinical observations, patient history, and epidemiological information.  Fact Sheet for Patients:   StrictlyIdeas.no  Fact Sheet for Healthcare Providers: BankingDealers.co.za  This test is not yet approved or  cleared by the [place] FDA and has been authorized for detection and/or diagnosis of SARS-CoV-2 by FDA under an Emergency Use Authorization (EUA).  This EUA will remain in effect (meaning this test can be used) for the duration of the COVID-19 declaration under Section 564(b)(1) of the Act, 21 U.S.C. section 360bbb-3(b)(1), unless the authorization is terminated or revoked sooner.  Performed at [facility], [address] [place], [place] [address]   MRSA PCR Screening     Status: None   Collection Time: 11/28/19  9:08 PM   Specimen: Nasopharyngeal  Result Value Ref Range Status   MRSA by PCR NEGATIVE NEGATIVE Final    Comment:        The GeneXpert MRSA Assay (FDA approved for NASAL specimens only), is one component of a comprehensive MRSA colonization surveillance program. It is not intended to diagnose MRSA infection nor to guide or monitor treatment for MRSA infections. Performed at [facility], [place], [place] [address]   SARS CORONAVIRUS 2 (TAT 6-24 HRS) Nasopharyngeal Nasopharyngeal Swab     Status: None   Collection Time: 12/13/19  6:15 PM   Specimen: Nasopharyngeal Swab   Result Value Ref Range Status   SARS Coronavirus 2 NEGATIVE NEGATIVE Final    Comment: (NOTE) SARS-CoV-2 target nucleic acids are NOT DETECTED.  The SARS-CoV-2 RNA is generally detectable in upper and lower respiratory specimens during the acute phase of infection. Negative results do not preclude SARS-CoV-2 infection, do not rule out co-infections with other pathogens, and should not be used as the sole basis for treatment or other patient management decisions. Negative results must be combined with clinical observations, patient history, and epidemiological information. The expected result is Negative.  Fact Sheet for Patients: SugarRoll.be  Fact  Sheet for Healthcare Providers: https://www.woods-mathews.com/  This test is not yet approved or cleared by the [place] FDA and  has been authorized for detection and/or diagnosis of SARS-CoV-2 by FDA under an Emergency Use Authorization (EUA). This EUA will remain  in effect (meaning this test can be used) for the duration of the COVID-19 declaration under Se ction 564(b)(1) of the Act, 21 U.S.C. section 360bbb-3(b)(1), unless the authorization is terminated or revoked sooner.  Performed at [facility], [place] [address], [place], [place] [address]     Coagulation Studies: No results for input(s): LABPROT, INR in the last 72 hours.  Urinalysis: No results for input(s): COLORURINE, LABSPEC, PHURINE, GLUCOSEU, HGBUR, BILIRUBINUR, KETONESUR, PROTEINUR, UROBILINOGEN, NITRITE, LEUKOCYTESUR in the last 72 hours.  Invalid input(s): APPERANCEUR    Imaging: No results found.   Medications:   . sodium chloride Stopped (12/06/19 1834)  . sodium chloride Stopped (11/28/19 2021)  . sodium chloride    . sodium chloride     . sodium chloride   Intravenous Once  . amiodarone  100 mg Oral Daily  . apixaban  10 mg Oral BID   Followed by  . [START ON 12/18/2019] apixaban  5 mg Oral  BID  . chlorhexidine gluconate (MEDLINE KIT)  15 mL Mouth Rinse BID  . Chlorhexidine Gluconate Cloth  6 each Topical Q0600  . epoetin (EPOGEN/PROCRIT) injection  2,000 Units Subcutaneous Q T,Th,Sa-HD  . famotidine  20 mg Oral Daily  . feeding supplement (NEPRO CARB STEADY)  237 mL Oral BID BM  . insulin aspart  0-5 Units Subcutaneous QHS  . insulin aspart  0-6 Units Subcutaneous TID WC  . melatonin  2.5 mg Oral QHS  . midodrine  10 mg Oral TID WC  . multivitamin  1 tablet Oral QHS  . sodium chloride flush  10-40 mL Intracatheter Q12H   sodium chloride, sodium chloride, alteplase, bisacodyl, heparin, HYDROmorphone (DILAUDID) injection, lidocaine (PF), lidocaine-prilocaine, ondansetron (ZOFRAN) IV, pentafluoroprop-tetrafluoroeth, polyethylene glycol, sodium chloride flush  Assessment/ Plan:  [name] is a 86 y.o. 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 End-stage renal disease with volume overload:   Due for HD today, orders prepared.   2. Hypokalemia:  Potassium 4.4 today.  We will continue to periodically monitor serum potassium levels.  3.  Atrial fibrillation Heart rate currently 66.  Patient maintained on amiodarone and Eliquis.  4. Anemia with chronic kidney disease: receives Mircera as outpatient.  PRBC transfusion 6/29.  -Continue Epogen 2000 units on TTS Lab Results  Component Value Date   HGB 9.4 (L) 12/17/2019      LOS: 19 Kerry [name] 7/13/202110:38 AM

## 2019-12-17 NOTE — Progress Notes (Signed)
Discharge and phone instructions given to Marletta Lor RN. All questions answered. Patient to be transported with oxygen at 4 L/min via trache collar. O2 Sat at 91-93 %

## 2019-12-17 NOTE — Progress Notes (Signed)
Patient assisted from the commode to beside.  Pt's wife insisted that patient remains sitting at the edge of the bed and wants to feed and finish feeding her husband Patient not covered with blanket as he usually is well covered in blankets in bed post Dialysis since he always is cold per wife. It was explained to patient's wife that EMS is coming to transport patient to [facility]. And then as he is in bed and covered to be warm and then continue to finish his dinner. The wife insisted that she is going to continue to feed him at the side of the bed and continue to be uncovered. The wife insisted to talk with the Charge Nurse.

## 2019-12-17 NOTE — Discharge Instructions (Signed)
Acute Respiratory Failure, Adult  Acute respiratory failure occurs when there is not enough oxygen passing from your lungs to your body. When this happens, your lungs have trouble removing carbon dioxide from the blood. This causes your blood oxygen level to drop too low as carbon dioxide builds up. Acute respiratory failure is a medical emergency. It can develop quickly, but it is temporary if treated promptly. Your lung capacity, or how much air your lungs can hold, may improve with time, exercise, and treatment. What are the causes? There are many possible causes of acute respiratory failure, including:  Lung injury.  Chest injury or damage to the ribs or tissues near the lungs.  Lung conditions that affect the flow of air and blood into and out of the lungs, such as pneumonia, acute respiratory distress syndrome, and cystic fibrosis.  Medical conditions, such as strokes or spinal cord injuries, that affect the muscles and nerves that control breathing.  Blood infection (sepsis).  Inflammation of the pancreas (pancreatitis).  A blood clot in the lungs (pulmonary embolism).  A large-volume blood transfusion.  Burns.  Near-drowning.  Seizure.  Smoke inhalation.  Reaction to medicines.  Alcohol or drug overdose. What increases the risk? This condition is more likely to develop in people who have:  A blocked airway.  Asthma.  A condition or disease that damages or weakens the muscles, nerves, bones, or tissues that are involved in breathing.  A serious infection.  A health problem that blocks the unconscious reflex that is involved in breathing, such as hypothyroidism or sleep apnea.  A lung injury or trauma. What are the signs or symptoms? Trouble breathing is the main symptom of acute respiratory failure. Symptoms may also include:  Rapid breathing.  Restlessness or anxiety.  Skin, lips, or fingernails that appear blue (cyanosis).  Rapid heart  rate.  Abnormal heart rhythms (arrhythmias).  Confusion or changes in behavior.  Tiredness or loss of energy.  Feeling sleepy or having a loss of consciousness. How is this diagnosed? Your health care provider can diagnose acute respiratory failure with a medical history and physical exam. During the exam, your health care provider will listen to your heart and check for crackling or wheezing sounds in your lungs. Your may also have tests to confirm the diagnosis and determine what is causing respiratory failure. These tests may include:  Measuring the amount of oxygen in your blood (pulse oximetry). The measurement comes from a small device that is placed on your finger, earlobe, or toe.  Other blood tests to measure blood gases and to look for signs of infection.  Sampling your cerebral spinal fluid or tracheal fluid to check for infections.  Chest X-ray to look for fluid in spaces that should be filled with air.  Electrocardiogram (ECG) to look at the heart's electrical activity. How is this treated? Treatment for this condition usually takes places in a hospital intensive care unit (ICU). Treatment depends on what is causing the condition. It may include one or more treatments until your symptoms improve. Treatment may include:  Supplemental oxygen. Extra oxygen is given through a tube in the nose, a face mask, or a hood.  A device such as a continuous positive airway pressure (CPAP) or bi-level positive airway pressure (BiPAP or BPAP) machine. This treatment uses mild air pressure to keep the airways open. A mask or other device will be placed over your nose or mouth. A tube that is connected to a motor will deliver oxygen through the   mask.  Ventilator. This treatment helps move air into and out of the lungs. This may be done with a bag and mask or a machine. For this treatment, a tube is placed in your windpipe (trachea) so air and oxygen can flow to the lungs.  Extracorporeal  membrane oxygenation (ECMO). This treatment temporarily takes over the function of the heart and lungs, supplying oxygen and removing carbon dioxide. ECMO gives the lungs a chance to recover. It may be used if a ventilator is not effective.  Tracheostomy. This is a procedure that creates a hole in the neck to insert a breathing tube.  Receiving fluids and medicines.  Rocking the bed to help breathing. Follow these instructions at home:  Take over-the-counter and prescription medicines only as told by your health care provider.  Return to normal activities as told by your health care provider. Ask your health care provider what activities are safe for you.  Keep all follow-up visits as told by your health care provider. This is important. How is this prevented? Treating infections and medical conditions that may lead to acute respiratory failure can help prevent the condition from developing. Contact a health care provider if:  You have a fever.  Your symptoms do not improve or they get worse. Get help right away if:  You are having trouble breathing.  You lose consciousness.  Your have cyanosis or turn blue.  You develop a rapid heart rate.  You are confused. These symptoms may represent a serious problem that is an emergency. Do not wait to see if the symptoms will go away. Get medical help right away. Call your local emergency services (911 in the [place] Do not drive yourself to the hospital. This information is not intended to replace advice given to you by your health care provider. Make sure you discuss any questions you have with your health care provider. Document Revised: 05/05/2017 Document Reviewed: 12/09/2015 Elsevier Patient Education  [place].   Atrial Fibrillation  Atrial fibrillation is a type of irregular or rapid heartbeat (arrhythmia). In atrial fibrillation, the top part of the heart (atria) beats in an irregular pattern. This makes the heart  unable to pump blood normally and effectively. The goal of treatment is to prevent blood clots from forming, control your heart rate, or restore your heartbeat to a normal rhythm. If this condition is not treated, it can cause serious problems, such as a weakened heart muscle (cardiomyopathy) or a stroke. What are the causes? This condition is often caused by medical conditions that damage the heart's electrical system. These include:  High blood pressure (hypertension). This is the most common cause.  Certain heart problems or conditions, such as heart failure, coronary artery disease, heart valve problems, or heart surgery.  Diabetes.  Overactive thyroid (hyperthyroidism).  Obesity.  Chronic kidney disease. In some cases, the cause of this condition is not known. What increases the risk? This condition is more likely to develop in:  Older people.  People who smoke.  Athletes who do endurance exercise.  People who have a family history of atrial fibrillation.  Men.  People who use drugs.  People who drink a lot of alcohol.  People who have lung conditions, such as emphysema, pneumonia, or COPD.  People who have obstructive sleep apnea. What are the signs or symptoms? Symptoms of this condition include:  A feeling that your heart is racing or beating irregularly.  Discomfort or pain in your chest.  Shortness of breath.  Sudden light-headedness or weakness.  Tiring easily during exercise or activity.  Fatigue.  Syncope (fainting).  Sweating. In some cases, there are no symptoms. How is this diagnosed? Your health care provider may detect atrial fibrillation when taking your pulse. If detected, this condition may be diagnosed with:  An electrocardiogram (ECG) to check electrical signals of the heart.  An ambulatory cardiac monitor to record your heart's activity for a few days.  A transthoracic echocardiogram (TTE) to create pictures of your heart.  A  transesophageal echocardiogram (TEE) to create even closer pictures of your heart.  A stress test to check your blood supply while you exercise.  Imaging tests, such as a CT scan or chest X-ray.  Blood tests. How is this treated? Treatment depends on underlying conditions and how you feel when you experience atrial fibrillation. This condition may be treated with:  Medicines to prevent blood clots or to treat heart rate or heart rhythm problems.  Electrical cardioversion to reset the heart's rhythm.  A pacemaker to correct abnormal heart rhythm.  Ablation to remove the heart tissue that sends abnormal signals.  Left atrial appendage closure to seal the area where blood clots can form. In some cases, underlying conditions will be treated. Follow these instructions at home: Medicines  Take over-the counter and prescription medicines only as told by your health care provider.  Do not take any new medicines without talking to your health care provider.  If you are taking blood thinners: ? Talk with your health care provider before you take any medicines that contain aspirin or NSAIDs, such as ibuprofen. These medicines increase your risk for dangerous bleeding. ? Take your medicine exactly as told, at the same time every day. ? Avoid activities that could cause injury or bruising, and follow instructions about how to prevent falls. ? Wear a medical alert bracelet or carry a card that lists what medicines you take. Lifestyle      Do not use any products that contain nicotine or tobacco, such as cigarettes, e-cigarettes, and chewing tobacco. If you need help quitting, ask your health care provider.  Eat heart-healthy foods. Talk with a dietitian to make an eating plan that is right for you.  Exercise regularly as told by your health care provider.  Do not drink alcohol.  Lose weight if you are overweight.  Do not use drugs, including cannabis. General instructions  If you  have obstructive sleep apnea, manage your condition as told by your health care provider.  Do not use diet pills unless your health care provider approves. Diet pills can make heart problems worse.  Keep all follow-up visits as told by your health care provider. This is important. Contact a health care provider if you:  Notice a change in the rate, rhythm, or strength of your heartbeat.  Are taking a blood thinner and you notice more bruising.  Tire more easily when you exercise or do heavy work.  Have a sudden change in weight. Get help right away if you have:   Chest pain, abdominal pain, sweating, or weakness.  Trouble breathing.  Side effects of blood thinners, such as blood in your vomit, stool, or urine, or bleeding that cannot stop.  Any symptoms of a stroke. "BE FAST" is an easy way to remember the main warning signs of a stroke: ? B - Balance. Signs are dizziness, sudden trouble walking, or loss of balance. ? E - Eyes. Signs are trouble seeing or a sudden change in vision. ?  F - Face. Signs are sudden weakness or numbness of the face, or the face or eyelid drooping on one side. ? A - Arms. Signs are weakness or numbness in an arm. This happens suddenly and usually on one side of the body. ? S - Speech. Signs are sudden trouble speaking, slurred speech, or trouble understanding what people say. ? T - Time. Time to call emergency services. Write down what time symptoms started.  Other signs of a stroke, such as: ? A sudden, severe headache with no known cause. ? Nausea or vomiting. ? Seizure. These symptoms may represent a serious problem that is an emergency. Do not wait to see if the symptoms will go away. Get medical help right away. Call your local emergency services (911 in the [place] Do not drive yourself to the hospital. Summary  Atrial fibrillation is a type of irregular or rapid heartbeat (arrhythmia).  Symptoms include a feeling that your heart is beating fast  or irregularly.  You may be given medicines to prevent blood clots or to treat heart rate or heart rhythm problems.  Get help right away if you have signs or symptoms of a stroke.  Get help right away if you cannot catch your breath or have chest pain or pressure. This information is not intended to replace advice given to you by your health care provider. Make sure you discuss any questions you have with your health care provider. Document Revised: 11/14/2018 Document Reviewed: 11/14/2018 Elsevier Patient Education  [place].

## 2019-12-17 NOTE — Discharge Summary (Signed)
[facility] NAME: Kerry [name]    MR#:  [id number]  DATE OF BIRTH:  Oct 25, 1933  DATE OF ADMISSION:  11/28/2019 ADMITTING PHYSICIAN: Murlean Iba, MD  DATE OF DISCHARGE: 12/17/2019  PRIMARY CARE PHYSICIAN: Cecile Sheerer, MD    ADMISSION DIAGNOSIS:  Cardiac arrest ([place]) [I46.9] Atrial fibrillation with RVR ([place]) [I48.91] Altered mental status, unspecified altered mental status type [R41.82]  DISCHARGE DIAGNOSIS:  Active Problems:   Anemia of chronic disease   CKD (chronic kidney disease) stage 4, GFR 15-29 ml/min (HCC)   Coronary artery disease   Heart failure with preserved ejection fraction ([place])   History of tracheostomy   Hypertension   Type 2 diabetes mellitus ([place])   Cardiac arrest ([place])   Acute on chronic respiratory failure with hypoxia and hypercapnia (HCC)   Acute deep vein thrombosis (DVT) of right upper extremity (HCC)   AF (paroxysmal atrial fibrillation) (HCC)   Acute systolic CHF (congestive heart failure) (HCC)   Hypotension   ESRD (end stage renal disease) ([place])   SECONDARY DIAGNOSIS:   Past Medical History:  Diagnosis Date  . Cancer ([place])   . CHF (congestive heart failure) ([place])   . COPD (chronic obstructive pulmonary disease) ([place])   . Diabetes mellitus without complication ([place])   . Hypertension   . Renal disorder     HOSPITAL COURSE:   1.  Acute hypoxic and hypercapnic respiratory failure.  Now chronic in nature.  Patient on 5 L 28% FiO2 tracheostomy collar.  Saturating 94%.  Was ambulated to come off to room air yesterday. 2.  Ventricular fibrillation arrest.  Cardiology recommending conservative management for right now.  Follow-up with cardiology as outpatient.  Can consider AICD as outpatient.  Aspirin stopped secondary to being placed on Eliquis.  On high-dose atorvastatin. 3.  DVT right internal jugular vein started on Eliquis. 4.  Acute systolic congestive heart failure with  EF of 20%.  Dialysis is the only way to manage fluid.  Limited with any other medications secondary to hypotension and being on midodrine. 5.  Hypotension on midodrine.  Hopefully this can be tapered over time and cardiac meds can be started slowly as outpatient. 6.  End-stage renal disease on hemodialysis Tuesday Thursday and Saturday. 7.  Acute metabolic encephalopathy.  This has improved 8.  History of laryngeal carcinoma status post tracheostomy with stoma on trach collar. 9.  Paroxysmal atrial fibrillation and acquired thrombophilia.  Patient is on Eliquis for anticoagulation to reduce stroke risk and thrombus risk. 10.  Weakness.  Physical therapy recommends rehab. 11.  Impaired fasting glucose.  Hemoglobin A1c 5.5.  Continue to monitor as outpatient  Please see previous discharge summary on 12/13/2019 for hospital course until that time.  Patient was admitted to the critical care service on 11/28/2019 for ventricular fibrillation arrest.  Patient is a full code.  DISCHARGE CONDITIONS:   Fair  CONSULTS OBTAINED:  Treatment Team:  Anthonette Legato, MD Bradly Bienenstock, NP  DRUG ALLERGIES:   Allergies  Allergen Reactions  . Ace Inhibitors Other (See Comments)    Potassium elevated  . Gabapentin Other (See Comments)    Ataxia at 100 mg.  . Losartan Other (See Comments)    Hyperkalemia    DISCHARGE MEDICATIONS:   Allergies as of 12/17/2019      Reactions   Ace Inhibitors Other (See Comments)   Potassium elevated   Gabapentin Other (See Comments)   Ataxia at 100 mg.  Losartan Other (See Comments)   Hyperkalemia      Medication List    STOP taking these medications   amLODipine 2.5 MG tablet Commonly known as: NORVASC   aspirin 81 MG chewable tablet   clopidogrel 75 MG tablet Commonly known as: PLAVIX   glipiZIDE XL 10 MG 24 hr tablet Generic drug: glipiZIDE   hydrALAZINE 25 MG tablet Commonly known as: APRESOLINE   isosorbide dinitrate 20 MG  tablet Commonly known as: ISORDIL   metoprolol succinate 25 MG 24 hr tablet Commonly known as: TOPROL-XL   mirtazapine 7.5 MG tablet Commonly known as: REMERON   nitroGLYCERIN 0.3 MG SL tablet Commonly known as: NITROSTAT   pantoprazole 40 MG tablet Commonly known as: PROTONIX   torsemide 20 MG tablet Commonly known as: DEMADEX   Triphrocaps 1 MG Caps Replaced by: multivitamin Tabs tablet   Vitamin D3 25 MCG (1000 UT) Caps     TAKE these medications   alteplase 2 MG injection Commonly known as: CATHFLO ACTIVASE 2 mg by Intracatheter route once as needed for open catheter.   amiodarone 100 MG tablet Commonly known as: PACERONE Take 1 tablet (100 mg total) by mouth daily.   apixaban 5 MG Tabs tablet Commonly known as: ELIQUIS Two tablets twice a day through 12/17/2019 then one tablet twice aday afterwards   atorvastatin 80 MG tablet Commonly known as: LIPITOR Take 80 mg by mouth daily.   chlorhexidine gluconate (MEDLINE KIT) 0.12 % solution Commonly known as: PERIDEX 15 mLs by Mouth Rinse route 2 (two) times daily.   Chlorhexidine Gluconate Cloth 2 % Pads Apply 6 each topically daily at 6 (six) AM.   epoetin alfa 2000 UNIT/ML injection Commonly known as: EPOGEN Inject 1 mL (2,000 Units total) into the vein Every Tuesday,Thursday,and Saturday with dialysis.   feeding supplement (NEPRO CARB STEADY) Liqd Take 237 mLs by mouth 2 (two) times daily between meals for 10 days.   ferrous sulfate 325 (65 FE) MG EC tablet Take 325 mg by mouth daily with breakfast.   Glucagon Emergency 1 MG Kit as needed. Reported on 06/02/2015   lidocaine-prilocaine cream Commonly known as: EMLA Apply 1 application topically as needed (topical anesthesia for hemodialysis if Gebauers and Lidocaine injection are ineffective.).   melatonin 5 MG Tabs Take 0.5 tablets (2.5 mg total) by mouth at bedtime.   midodrine 10 MG tablet Commonly known as: PROAMATINE Take 1 tablet (10 mg  total) by mouth 3 (three) times daily with meals.   multivitamin Tabs tablet Take 1 tablet by mouth at bedtime. Replaces: Triphrocaps 1 MG Caps   pentafluoroprop-tetrafluoroeth Aero Commonly known as: GEBAUERS Apply 1 application topically as needed (topical anesthesia for hemodialysis).   polyethylene glycol 17 g packet Commonly known as: MIRALAX / GLYCOLAX Take 17 g by mouth daily as needed for moderate constipation.   Precision QID Test test strip Generic drug: glucose blood Accu check Aviva strips, use twice daily   sevelamer carbonate 800 MG tablet Commonly known as: RENVELA Take 800-1,600 mg by mouth in the morning, at noon, in the evening, and at bedtime.        DISCHARGE INSTRUCTIONS:   Satisfactory  If you experience worsening of your admission symptoms, develop shortness of breath, life threatening emergency, suicidal or homicidal thoughts you must seek medical attention immediately by calling 911 or calling your MD immediately  if symptoms less severe.  You Must read complete instructions/literature along with all the possible adverse reactions/side effects for all the Medicines  you take and that have been prescribed to you. Take any new Medicines after you have completely understood and accept all the possible adverse reactions/side effects.   Please note  You were cared for by a hospitalist during your hospital stay. If you have any questions about your discharge medications or the care you received while you were in the hospital after you are discharged, you can call the unit and asked to speak with the hospitalist on call if the hospitalist that took care of you is not available. Once you are discharged, your primary care physician will handle any further medical issues. Please note that NO REFILLS for any discharge medications will be authorized once you are discharged, as it is imperative that you return to your primary care physician (or establish a relationship  with a primary care physician if you do not have one) for your aftercare needs so that they can reassess your need for medications and monitor your lab values.    Today   CHIEF COMPLAINT:  No chief complaint on file.   HISTORY OF PRESENT ILLNESS:  La Dibella  is a 86 y.o. male was admitted with cardiac arrest   VITAL SIGNS:  Blood pressure 126/64, pulse 76, temperature 98 F (36.7 C), temperature source Oral, resp. rate (!) 22, height 5' 6"  (1.676 m), weight 75.5 kg, SpO2 94 %.  I/O:    Intake/Output Summary (Last 24 hours) at 12/17/2019 1503 Last data filed at 12/17/2019 1330 Gross per 24 hour  Intake 240 ml  Output 2000 ml  Net -1760 ml    PHYSICAL EXAMINATION:  GENERAL:  86 y.o.-year-old patient lying in the bed with no acute distress.  EYES: Pupils equal, round, reactive to light and accommodation. No scleral icterus. Extraocular muscles intact.  HEENT: Head atraumatic, normocephalic. Oropharynx and nasopharynx clear.  LUNGS: Normal breath sounds bilaterally, no wheezing, rales,rhonchi or crepitation. No use of accessory muscles of respiration.  CARDIOVASCULAR: S1, S2 normal. No murmurs, rubs, or gallops.  ABDOMEN: Soft, non-tender, non-distended.  EXTREMITIES: No pedal edema.  NEUROLOGIC: Cranial nerves II through XII are intact.  Able to straight leg raise bilaterally PSYCHIATRIC: The patient is alert and answers questions by mouthing words and trying to talk and shaking his head.Marland Kitchen  SKIN: No obvious rash, lesion, or ulcer.   DATA REVIEW:   CBC Recent Labs  Lab 12/17/19 0526  WBC 7.0  HGB 9.4*  HCT 25.7*  PLT 229    Chemistries  Recent Labs  Lab 12/17/19 0526  NA 139  K 4.4  CL 98  CO2 28  GLUCOSE 192*  BUN 76*  CREATININE 7.65*  CALCIUM 8.6*  MG 2.0    Microbiology Results  Results for orders placed or performed during the hospital encounter of 11/28/19  Culture, blood (routine x 2)     Status: None   Collection Time: 11/28/19  1:31 PM    Specimen: BLOOD  Result Value Ref Range Status   Specimen Description BLOOD BLOOD RIGHT ARM  Final   Special Requests   Final    BOTTLES DRAWN AEROBIC AND ANAEROBIC Blood Culture adequate volume   Culture   Final    NO GROWTH 5 DAYS Performed at [facility], [address] [place], [place] [address]    Report Status 12/03/2019 FINAL  Final  Culture, blood (routine x 2)     Status: None   Collection Time: 11/28/19  2:17 PM   Specimen: BLOOD  Result Value Ref Range Status  Specimen Description BLOOD BLOOD RIGHT WRIST  Final   Special Requests   Final    BOTTLES DRAWN AEROBIC AND ANAEROBIC Blood Culture adequate volume   Culture   Final    NO GROWTH 5 DAYS Performed at [facility], [address] [place], [place] [address]    Report Status 12/03/2019 FINAL  Final  SARS Coronavirus 2 by RT PCR (hospital order, performed in [facility] hospital lab) Nasopharyngeal Nasopharyngeal Swab     Status: None   Collection Time: 11/28/19  4:40 PM   Specimen: Nasopharyngeal Swab  Result Value Ref Range Status   SARS Coronavirus 2 NEGATIVE NEGATIVE Final    Comment: (NOTE) SARS-CoV-2 target nucleic acids are NOT DETECTED.  The SARS-CoV-2 RNA is generally detectable in upper and lower respiratory specimens during the acute phase of infection. The lowest concentration of SARS-CoV-2 viral copies this assay can detect is 250 copies / mL. A negative result does not preclude SARS-CoV-2 infection and should not be used as the sole basis for treatment or other patient management decisions.  A negative result may occur with improper specimen collection / handling, submission of specimen other than nasopharyngeal swab, presence of viral mutation(s) within the areas targeted by this assay, and inadequate number of viral copies (<250 copies / mL). A negative result must be combined with clinical observations, patient history, and epidemiological information.  Fact Sheet for  Patients:   StrictlyIdeas.no  Fact Sheet for Healthcare Providers: BankingDealers.co.za  This test is not yet approved or  cleared by the [place] FDA and has been authorized for detection and/or diagnosis of SARS-CoV-2 by FDA under an Emergency Use Authorization (EUA).  This EUA will remain in effect (meaning this test can be used) for the duration of the COVID-19 declaration under Section 564(b)(1) of the Act, 21 U.S.C. section 360bbb-3(b)(1), unless the authorization is terminated or revoked sooner.  Performed at [facility], [address] [place], [place] [address]   MRSA PCR Screening     Status: None   Collection Time: 11/28/19  9:08 PM   Specimen: Nasopharyngeal  Result Value Ref Range Status   MRSA by PCR NEGATIVE NEGATIVE Final    Comment:        The GeneXpert MRSA Assay (FDA approved for NASAL specimens only), is one component of a comprehensive MRSA colonization surveillance program. It is not intended to diagnose MRSA infection nor to guide or monitor treatment for MRSA infections. Performed at [facility], [place], [place] [address]   SARS CORONAVIRUS 2 (TAT 6-24 HRS) Nasopharyngeal Nasopharyngeal Swab     Status: None   Collection Time: 12/13/19  6:15 PM   Specimen: Nasopharyngeal Swab  Result Value Ref Range Status   SARS Coronavirus 2 NEGATIVE NEGATIVE Final    Comment: (NOTE) SARS-CoV-2 target nucleic acids are NOT DETECTED.  The SARS-CoV-2 RNA is generally detectable in upper and lower respiratory specimens during the acute phase of infection. Negative results do not preclude SARS-CoV-2 infection, do not rule out co-infections with other pathogens, and should not be used as the sole basis for treatment or other patient management decisions. Negative results must be combined with clinical observations, patient history, and epidemiological information. The  expected result is Negative.  Fact Sheet for Patients: SugarRoll.be  Fact Sheet for Healthcare Providers: https://www.woods-mathews.com/  This test is not yet approved or cleared by the [place] FDA and  has been authorized for detection and/or diagnosis of SARS-CoV-2 by FDA under  an Emergency Use Authorization (EUA). This EUA will remain  in effect (meaning this test can be used) for the duration of the COVID-19 declaration under Se ction 564(b)(1) of the Act, 21 U.S.C. section 360bbb-3(b)(1), unless the authorization is terminated or revoked sooner.  Performed at [facility], [place] [address], [place], [place] [address]      Management plans discussed with the patient, family and they are in agreement.  CODE STATUS:     Code Status Orders  (From admission, onward)         Start     Ordered   11/28/19 1937  Full code  Continuous        11/28/19 1944        Code Status History    Date Active Date Inactive Code Status Order ID Comments User Context   11/28/2019 1642 11/28/2019 1944 Full Code [id number]  Ottie Glazier, MD ED   Advance Care Planning Activity      TOTAL TIME TAKING CARE OF THIS PATIENT: 32 minutes.    Loletha Grayer M.D on 12/17/2019 at 3:03 PM  Between 7am to 6pm - Pager - [phone]  After 6pm go to [email or web address] - password [email or web address]  [facility]  CC: Primary care physician; Shapely-Quinn, Okey Regal, MD

## 2019-12-18 LAB — GLUCOSE, CAPILLARY: Glucose-Capillary: 209 mg/dL — ABNORMAL HIGH (ref 70–99)

## 2019-12-25 DIAGNOSIS — E8770 Fluid overload, unspecified: Secondary | ICD-10-CM | POA: Insufficient documentation

## 2019-12-27 DIAGNOSIS — R4182 Altered mental status, unspecified: Secondary | ICD-10-CM | POA: Insufficient documentation

## 2020-02-12 ENCOUNTER — Encounter: Payer: Self-pay | Admitting: Emergency Medicine

## 2020-02-12 DIAGNOSIS — J9611 Chronic respiratory failure with hypoxia: Secondary | ICD-10-CM | POA: Diagnosis present

## 2020-02-12 DIAGNOSIS — I480 Paroxysmal atrial fibrillation: Secondary | ICD-10-CM | POA: Diagnosis present

## 2020-02-12 DIAGNOSIS — Z888 Allergy status to other drugs, medicaments and biological substances status: Secondary | ICD-10-CM

## 2020-02-12 DIAGNOSIS — Z79899 Other long term (current) drug therapy: Secondary | ICD-10-CM

## 2020-02-12 DIAGNOSIS — Z85819 Personal history of malignant neoplasm of unspecified site of lip, oral cavity, and pharynx: Secondary | ICD-10-CM

## 2020-02-12 DIAGNOSIS — I82621 Acute embolism and thrombosis of deep veins of right upper extremity: Secondary | ICD-10-CM | POA: Diagnosis present

## 2020-02-12 DIAGNOSIS — Z7901 Long term (current) use of anticoagulants: Secondary | ICD-10-CM

## 2020-02-12 DIAGNOSIS — D631 Anemia in chronic kidney disease: Secondary | ICD-10-CM | POA: Diagnosis present

## 2020-02-12 DIAGNOSIS — D696 Thrombocytopenia, unspecified: Secondary | ICD-10-CM | POA: Diagnosis present

## 2020-02-12 DIAGNOSIS — R1314 Dysphagia, pharyngoesophageal phase: Secondary | ICD-10-CM | POA: Diagnosis present

## 2020-02-12 DIAGNOSIS — E43 Unspecified severe protein-calorie malnutrition: Secondary | ICD-10-CM | POA: Diagnosis present

## 2020-02-12 DIAGNOSIS — B351 Tinea unguium: Secondary | ICD-10-CM | POA: Diagnosis present

## 2020-02-12 DIAGNOSIS — Z6820 Body mass index (BMI) 20.0-20.9, adult: Secondary | ICD-10-CM

## 2020-02-12 DIAGNOSIS — G4733 Obstructive sleep apnea (adult) (pediatric): Secondary | ICD-10-CM | POA: Diagnosis present

## 2020-02-12 DIAGNOSIS — Z992 Dependence on renal dialysis: Secondary | ICD-10-CM

## 2020-02-12 DIAGNOSIS — Z930 Tracheostomy status: Secondary | ICD-10-CM

## 2020-02-12 DIAGNOSIS — E871 Hypo-osmolality and hyponatremia: Secondary | ICD-10-CM | POA: Diagnosis present

## 2020-02-12 DIAGNOSIS — R1310 Dysphagia, unspecified: Secondary | ICD-10-CM | POA: Diagnosis not present

## 2020-02-12 DIAGNOSIS — I493 Ventricular premature depolarization: Secondary | ICD-10-CM | POA: Diagnosis present

## 2020-02-12 DIAGNOSIS — I2510 Atherosclerotic heart disease of native coronary artery without angina pectoris: Secondary | ICD-10-CM | POA: Diagnosis present

## 2020-02-12 DIAGNOSIS — N186 End stage renal disease: Secondary | ICD-10-CM | POA: Diagnosis present

## 2020-02-12 DIAGNOSIS — I82C11 Acute embolism and thrombosis of right internal jugular vein: Secondary | ICD-10-CM | POA: Diagnosis present

## 2020-02-12 DIAGNOSIS — E785 Hyperlipidemia, unspecified: Secondary | ICD-10-CM | POA: Diagnosis present

## 2020-02-12 DIAGNOSIS — E11649 Type 2 diabetes mellitus with hypoglycemia without coma: Secondary | ICD-10-CM | POA: Diagnosis present

## 2020-02-12 DIAGNOSIS — Z20822 Contact with and (suspected) exposure to covid-19: Secondary | ICD-10-CM | POA: Diagnosis present

## 2020-02-12 DIAGNOSIS — K222 Esophageal obstruction: Principal | ICD-10-CM | POA: Diagnosis present

## 2020-02-12 DIAGNOSIS — E1122 Type 2 diabetes mellitus with diabetic chronic kidney disease: Secondary | ICD-10-CM | POA: Diagnosis present

## 2020-02-12 DIAGNOSIS — N2581 Secondary hyperparathyroidism of renal origin: Secondary | ICD-10-CM | POA: Diagnosis present

## 2020-02-12 DIAGNOSIS — Z87891 Personal history of nicotine dependence: Secondary | ICD-10-CM

## 2020-02-12 DIAGNOSIS — I5022 Chronic systolic (congestive) heart failure: Secondary | ICD-10-CM | POA: Diagnosis present

## 2020-02-12 DIAGNOSIS — Z8521 Personal history of malignant neoplasm of larynx: Secondary | ICD-10-CM

## 2020-02-12 DIAGNOSIS — L600 Ingrowing nail: Secondary | ICD-10-CM | POA: Diagnosis present

## 2020-02-12 DIAGNOSIS — Z8249 Family history of ischemic heart disease and other diseases of the circulatory system: Secondary | ICD-10-CM

## 2020-02-12 DIAGNOSIS — I132 Hypertensive heart and chronic kidney disease with heart failure and with stage 5 chronic kidney disease, or end stage renal disease: Secondary | ICD-10-CM | POA: Diagnosis present

## 2020-02-12 DIAGNOSIS — E876 Hypokalemia: Secondary | ICD-10-CM | POA: Diagnosis present

## 2020-02-12 DIAGNOSIS — J449 Chronic obstructive pulmonary disease, unspecified: Secondary | ICD-10-CM | POA: Diagnosis present

## 2020-02-12 LAB — CBC
HCT: 37.4 % — ABNORMAL LOW (ref 39.0–52.0)
Hemoglobin: 13 g/dL (ref 13.0–17.0)
MCH: 32.8 pg (ref 26.0–34.0)
MCHC: 34.8 g/dL (ref 30.0–36.0)
MCV: 94.4 fL (ref 80.0–100.0)
Platelets: 153 10*3/uL (ref 150–400)
RBC: 3.96 MIL/uL — ABNORMAL LOW (ref 4.22–5.81)
RDW: 17.1 % — ABNORMAL HIGH (ref 11.5–15.5)
WBC: 5.8 10*3/uL (ref 4.0–10.5)
nRBC: 0.5 % — ABNORMAL HIGH (ref 0.0–0.2)

## 2020-02-12 LAB — COMPREHENSIVE METABOLIC PANEL
ALT: 21 U/L (ref 0–44)
AST: 19 U/L (ref 15–41)
Albumin: 4.3 g/dL (ref 3.5–5.0)
Alkaline Phosphatase: 54 U/L (ref 38–126)
Anion gap: 17 — ABNORMAL HIGH (ref 5–15)
BUN: 45 mg/dL — ABNORMAL HIGH (ref 8–23)
CO2: 26 mmol/L (ref 22–32)
Calcium: 9 mg/dL (ref 8.9–10.3)
Chloride: 99 mmol/L (ref 98–111)
Creatinine, Ser: 5.98 mg/dL — ABNORMAL HIGH (ref 0.61–1.24)
GFR calc Af Amer: 9 mL/min — ABNORMAL LOW (ref >60)
GFR calc non Af Amer: 8 mL/min — ABNORMAL LOW (ref >60)
Glucose, Bld: 55 mg/dL — ABNORMAL LOW (ref 70–99)
Potassium: 4.3 mmol/L (ref 3.5–5.1)
Sodium: 142 mmol/L (ref 135–145)
Total Bilirubin: 1.4 mg/dL — ABNORMAL HIGH (ref 0.3–1.2)
Total Protein: 7.6 g/dL (ref 6.5–8.1)

## 2020-02-12 LAB — GLUCOSE, CAPILLARY
Glucose-Capillary: 46 mg/dL — ABNORMAL LOW (ref 70–99)
Glucose-Capillary: 139 mg/dL — ABNORMAL HIGH (ref 70–99)

## 2020-02-12 LAB — LIPASE, BLOOD: Lipase: 26 U/L (ref 11–51)

## 2020-02-12 LAB — TROPONIN I (HIGH SENSITIVITY)
Troponin I (High Sensitivity): 47 ng/L — ABNORMAL HIGH (ref <18)
Troponin I (High Sensitivity): 52 ng/L — ABNORMAL HIGH (ref <18)

## 2020-02-12 MED ORDER — DEXTROSE 50 % IV SOLN
50.0000 mL | Freq: Once | INTRAVENOUS | Status: AC
  Administered 2020-02-12: 50 mL via INTRAVENOUS
  Filled 2020-02-12: qty 50

## 2020-02-12 NOTE — ED Triage Notes (Signed)
Pts caregiver in triage with pt who reports pt had dialysis on Tuesday and since returning pt has had multiple emesis and today pts blood sugar was 42 at home when caregiver called EMS. Pt with stoma and on 2-3L oxygen chronic.

## 2020-02-12 NOTE — ED Notes (Signed)
EMS , home ,X2 days difficulty swallowing . CBG 44 recheck 64.  AO x4 , Ambulatory

## 2020-02-13 ENCOUNTER — Encounter: Payer: Self-pay | Admitting: Internal Medicine

## 2020-02-13 ENCOUNTER — Emergency Department: Payer: Medicare Other

## 2020-02-13 ENCOUNTER — Inpatient Hospital Stay
Admission: EM | Admit: 2020-02-13 | Discharge: 2020-02-20 | DRG: 391 | Disposition: A | Discharge status: Home / Self Care | Payer: Medicare Other | Attending: Obstetrics and Gynecology | Admitting: Obstetrics and Gynecology

## 2020-02-13 DIAGNOSIS — Z992 Dependence on renal dialysis: Secondary | ICD-10-CM | POA: Diagnosis not present

## 2020-02-13 DIAGNOSIS — Z20822 Contact with and (suspected) exposure to covid-19: Secondary | ICD-10-CM | POA: Diagnosis present

## 2020-02-13 DIAGNOSIS — I132 Hypertensive heart and chronic kidney disease with heart failure and with stage 5 chronic kidney disease, or end stage renal disease: Secondary | ICD-10-CM | POA: Diagnosis present

## 2020-02-13 DIAGNOSIS — C139 Malignant neoplasm of hypopharynx, unspecified: Secondary | ICD-10-CM

## 2020-02-13 DIAGNOSIS — J449 Chronic obstructive pulmonary disease, unspecified: Secondary | ICD-10-CM | POA: Diagnosis present

## 2020-02-13 DIAGNOSIS — R112 Nausea with vomiting, unspecified: Secondary | ICD-10-CM | POA: Diagnosis not present

## 2020-02-13 DIAGNOSIS — I82621 Acute embolism and thrombosis of deep veins of right upper extremity: Secondary | ICD-10-CM | POA: Diagnosis present

## 2020-02-13 DIAGNOSIS — L600 Ingrowing nail: Secondary | ICD-10-CM | POA: Diagnosis present

## 2020-02-13 DIAGNOSIS — Z87891 Personal history of nicotine dependence: Secondary | ICD-10-CM | POA: Diagnosis not present

## 2020-02-13 DIAGNOSIS — I2510 Atherosclerotic heart disease of native coronary artery without angina pectoris: Secondary | ICD-10-CM | POA: Diagnosis present

## 2020-02-13 DIAGNOSIS — R1110 Vomiting, unspecified: Secondary | ICD-10-CM

## 2020-02-13 DIAGNOSIS — R1310 Dysphagia, unspecified: Secondary | ICD-10-CM

## 2020-02-13 DIAGNOSIS — I82C11 Acute embolism and thrombosis of right internal jugular vein: Secondary | ICD-10-CM | POA: Diagnosis present

## 2020-02-13 DIAGNOSIS — E162 Hypoglycemia, unspecified: Secondary | ICD-10-CM

## 2020-02-13 DIAGNOSIS — I480 Paroxysmal atrial fibrillation: Secondary | ICD-10-CM | POA: Diagnosis present

## 2020-02-13 DIAGNOSIS — D638 Anemia in other chronic diseases classified elsewhere: Secondary | ICD-10-CM | POA: Diagnosis present

## 2020-02-13 DIAGNOSIS — R1319 Other dysphagia: Secondary | ICD-10-CM

## 2020-02-13 DIAGNOSIS — E876 Hypokalemia: Secondary | ICD-10-CM | POA: Diagnosis present

## 2020-02-13 DIAGNOSIS — E871 Hypo-osmolality and hyponatremia: Secondary | ICD-10-CM | POA: Diagnosis present

## 2020-02-13 DIAGNOSIS — E43 Unspecified severe protein-calorie malnutrition: Secondary | ICD-10-CM | POA: Insufficient documentation

## 2020-02-13 DIAGNOSIS — D631 Anemia in chronic kidney disease: Secondary | ICD-10-CM | POA: Diagnosis present

## 2020-02-13 DIAGNOSIS — I5022 Chronic systolic (congestive) heart failure: Secondary | ICD-10-CM | POA: Diagnosis present

## 2020-02-13 DIAGNOSIS — D696 Thrombocytopenia, unspecified: Secondary | ICD-10-CM | POA: Diagnosis present

## 2020-02-13 DIAGNOSIS — J9611 Chronic respiratory failure with hypoxia: Secondary | ICD-10-CM | POA: Diagnosis present

## 2020-02-13 DIAGNOSIS — K222 Esophageal obstruction: Secondary | ICD-10-CM | POA: Diagnosis present

## 2020-02-13 DIAGNOSIS — I10 Essential (primary) hypertension: Secondary | ICD-10-CM | POA: Diagnosis present

## 2020-02-13 DIAGNOSIS — E1129 Type 2 diabetes mellitus with other diabetic kidney complication: Secondary | ICD-10-CM | POA: Diagnosis present

## 2020-02-13 DIAGNOSIS — E785 Hyperlipidemia, unspecified: Secondary | ICD-10-CM

## 2020-02-13 DIAGNOSIS — N186 End stage renal disease: Secondary | ICD-10-CM | POA: Diagnosis present

## 2020-02-13 DIAGNOSIS — N2581 Secondary hyperparathyroidism of renal origin: Secondary | ICD-10-CM | POA: Diagnosis present

## 2020-02-13 DIAGNOSIS — I493 Ventricular premature depolarization: Secondary | ICD-10-CM | POA: Diagnosis present

## 2020-02-13 DIAGNOSIS — B351 Tinea unguium: Secondary | ICD-10-CM | POA: Diagnosis present

## 2020-02-13 DIAGNOSIS — E1122 Type 2 diabetes mellitus with diabetic chronic kidney disease: Secondary | ICD-10-CM | POA: Diagnosis present

## 2020-02-13 LAB — GLUCOSE, CAPILLARY
Glucose-Capillary: 104 mg/dL — ABNORMAL HIGH (ref 70–99)
Glucose-Capillary: 114 mg/dL — ABNORMAL HIGH (ref 70–99)
Glucose-Capillary: 126 mg/dL — ABNORMAL HIGH (ref 70–99)
Glucose-Capillary: 142 mg/dL — ABNORMAL HIGH (ref 70–99)
Glucose-Capillary: 148 mg/dL — ABNORMAL HIGH (ref 70–99)
Glucose-Capillary: 166 mg/dL — ABNORMAL HIGH (ref 70–99)
Glucose-Capillary: 39 mg/dL — CL (ref 70–99)
Glucose-Capillary: 71 mg/dL (ref 70–99)
Glucose-Capillary: 72 mg/dL (ref 70–99)
Glucose-Capillary: 82 mg/dL (ref 70–99)

## 2020-02-13 LAB — BASIC METABOLIC PANEL
Anion gap: 18 — ABNORMAL HIGH (ref 5–15)
BUN: 54 mg/dL — ABNORMAL HIGH (ref 8–23)
CO2: 25 mmol/L (ref 22–32)
Calcium: 9.1 mg/dL (ref 8.9–10.3)
Chloride: 98 mmol/L (ref 98–111)
Creatinine, Ser: 7 mg/dL — ABNORMAL HIGH (ref 0.61–1.24)
GFR calc Af Amer: 8 mL/min — ABNORMAL LOW (ref >60)
GFR calc non Af Amer: 7 mL/min — ABNORMAL LOW (ref >60)
Glucose, Bld: 88 mg/dL (ref 70–99)
Potassium: 4.5 mmol/L (ref 3.5–5.1)
Sodium: 141 mmol/L (ref 135–145)

## 2020-02-13 LAB — APTT: aPTT: 51 seconds — ABNORMAL HIGH (ref 24–36)

## 2020-02-13 LAB — PROTIME-INR
INR: 1.8 — ABNORMAL HIGH (ref 0.8–1.2)
Prothrombin Time: 20.4 seconds — ABNORMAL HIGH (ref 11.4–15.2)

## 2020-02-13 LAB — SARS CORONAVIRUS 2 BY RT PCR (HOSPITAL ORDER, PERFORMED IN ~~LOC~~ HOSPITAL LAB): SARS Coronavirus 2: NEGATIVE

## 2020-02-13 LAB — HEPARIN LEVEL (UNFRACTIONATED): Heparin Unfractionated: >3.6 IU/mL — ABNORMAL HIGH (ref 0.30–0.70)

## 2020-02-13 MED ORDER — ATORVASTATIN CALCIUM 80 MG PO TABS
80.0000 mg | ORAL_TABLET | Freq: Every day | ORAL | Status: DC
  Filled 2020-02-13: qty 1

## 2020-02-13 MED ORDER — RENA-VITE PO TABS
1.0000 | ORAL_TABLET | Freq: Every day | ORAL | Status: DC
  Filled 2020-02-13 (×2): qty 1

## 2020-02-13 MED ORDER — SEVELAMER CARBONATE 800 MG PO TABS
1600.0000 mg | ORAL_TABLET | Freq: Three times a day (TID) | ORAL | Status: DC
  Filled 2020-02-13 (×4): qty 2

## 2020-02-13 MED ORDER — MELATONIN 5 MG PO TABS
2.5000 mg | ORAL_TABLET | Freq: Every day | ORAL | Status: DC
  Filled 2020-02-13: qty 0.5

## 2020-02-13 MED ORDER — SODIUM CHLORIDE 0.9% FLUSH: 3.0000 mL | INTRAVENOUS | Status: DC | PRN

## 2020-02-13 MED ORDER — FERROUS SULFATE 325 (65 FE) MG PO TABS
325.0000 mg | ORAL_TABLET | Freq: Every day | ORAL | Status: DC
  Filled 2020-02-13 (×2): qty 1

## 2020-02-13 MED ORDER — SEVELAMER CARBONATE 800 MG PO TABS
800.0000 mg | ORAL_TABLET | Freq: Every day | ORAL | Status: DC | PRN
  Filled 2020-02-13: qty 1

## 2020-02-13 MED ORDER — ONDANSETRON HCL 4 MG/2ML IJ SOLN: 4.0000 mg | Freq: Three times a day (TID) | INTRAMUSCULAR | Status: DC | PRN

## 2020-02-13 MED ORDER — AMIODARONE HCL 200 MG PO TABS
100.0000 mg | ORAL_TABLET | Freq: Every day | ORAL | Status: DC
  Administered 2020-02-13 – 2020-02-20 (×6): 100 mg via ORAL
  Filled 2020-02-13 (×7): qty 1

## 2020-02-13 MED ORDER — LEVOTHYROXINE SODIUM 50 MCG PO TABS
50.0000 ug | ORAL_TABLET | Freq: Every day | ORAL | Status: DC
  Administered 2020-02-14: 50 ug via ORAL
  Filled 2020-02-13: qty 1

## 2020-02-13 MED ORDER — MIDODRINE HCL 5 MG PO TABS
10.0000 mg | ORAL_TABLET | Freq: Three times a day (TID) | ORAL | Status: DC
  Filled 2020-02-13 (×4): qty 2

## 2020-02-13 MED ORDER — DEXTROSE 50 % IV SOLN
INTRAVENOUS | Status: AC
  Administered 2020-02-13: 50 mL via INTRAVENOUS
  Filled 2020-02-13: qty 50

## 2020-02-13 MED ORDER — DEXTROSE 10 % IV SOLN: INTRAVENOUS | Status: DC

## 2020-02-13 MED ORDER — SODIUM CHLORIDE 0.9% FLUSH
3.0000 mL | Freq: Two times a day (BID) | INTRAVENOUS | Status: DC
  Administered 2020-02-14 – 2020-02-20 (×8): 3 mL via INTRAVENOUS

## 2020-02-13 MED ORDER — ALBUTEROL SULFATE (2.5 MG/3ML) 0.083% IN NEBU: 2.5000 mg | INHALATION_SOLUTION | RESPIRATORY_TRACT | Status: DC | PRN

## 2020-02-13 MED ORDER — CHLORHEXIDINE GLUCONATE CLOTH 2 % EX PADS
6.0000 | MEDICATED_PAD | Freq: Every day | CUTANEOUS | Status: DC
  Filled 2020-02-13 (×2): qty 6

## 2020-02-13 MED ORDER — HYDRALAZINE HCL 20 MG/ML IJ SOLN: 5.0000 mg | INTRAMUSCULAR | Status: DC | PRN

## 2020-02-13 MED ORDER — ACETAMINOPHEN 325 MG PO TABS: 650.0000 mg | ORAL_TABLET | Freq: Four times a day (QID) | ORAL | Status: DC | PRN

## 2020-02-13 MED ORDER — POLYETHYLENE GLYCOL 3350 17 G PO PACK: 17.0000 g | PACK | Freq: Every day | ORAL | Status: DC | PRN

## 2020-02-13 MED ORDER — ACETAMINOPHEN 650 MG RE SUPP: 650.0000 mg | Freq: Four times a day (QID) | RECTAL | Status: DC | PRN

## 2020-02-13 MED ORDER — DEXTROSE 50 % IV SOLN: 50.0000 mL | INTRAVENOUS | Status: DC | PRN

## 2020-02-13 MED ORDER — CHLORHEXIDINE GLUCONATE 0.12% ORAL RINSE (MEDLINE KIT)
15.0000 mL | Freq: Two times a day (BID) | OROMUCOSAL | Status: DC
  Administered 2020-02-14 – 2020-02-20 (×8): 15 mL via OROMUCOSAL
  Filled 2020-02-13 (×2): qty 15

## 2020-02-13 MED ORDER — HEPARIN (PORCINE) 25000 UT/250ML-% IV SOLN
700.0000 [IU]/h | INTRAVENOUS | Status: DC
  Administered 2020-02-13: 950 [IU]/h via INTRAVENOUS
  Administered 2020-02-15: 650 [IU]/h via INTRAVENOUS
  Administered 2020-02-15: 550 [IU]/h via INTRAVENOUS
  Administered 2020-02-16: 650 [IU]/h via INTRAVENOUS
  Filled 2020-02-13 (×3): qty 250

## 2020-02-13 MED ORDER — LIDOCAINE-PRILOCAINE 2.5-2.5 % EX CREA
1.0000 "application " | TOPICAL_CREAM | CUTANEOUS | Status: DC | PRN
  Filled 2020-02-13: qty 5

## 2020-02-13 MED ORDER — SODIUM CHLORIDE 0.9 % IV SOLN: 250.0000 mL | INTRAVENOUS | Status: DC | PRN

## 2020-02-13 MED ORDER — SEVELAMER CARBONATE 800 MG PO TABS
800.0000 mg | ORAL_TABLET | Freq: Three times a day (TID) | ORAL | Status: DC
  Filled 2020-02-13: qty 2

## 2020-02-13 NOTE — Progress Notes (Signed)
[place], [place] 02/13/20  Subjective:   Hospital day # 0  Kerry [name] is a 86 y.o. male with medical history significant of laryngeal carcinoma, s/p of tracheostomy with stoma on trach collar, ESRD on TTS schedule, last dialysis on Tuesday, HTN, HLD, DM, COPD, on 2-3L of oxygen at baseline, CHF with EF 20-30%, RUE DVT, A fib,  CAD, presents with dysphagia, nausea and vomiting.     Objective:  Vital signs in last 24 hours:  Temp:  [97.8 F (36.6 C)-98 F (36.7 C)] 98 F (36.7 C) (09/08 2356) Pulse Rate:  [70-94] 76 (09/09 0527) Resp:  [16-29] 29 (09/09 1049) BP: (120-136)/(65-91) 136/85 (09/09 1049) SpO2:  [97 %-98 %] 98 % (09/09 0527)  Weight change:  There were no vitals filed for this visit.  Intake/Output:   No intake or output data in the 24 hours ending 02/13/20 1530   Physical Exam: General: Alert, In no acute distress  HEENT Normocephalic,Atraumatic,Trach collar on   Pulm/lungs Fine crackles bilaterally  CVS/Heart S1S2, Irregular rhythm  Abdomen:  Non distended,non tender  Extremities: No peripheral edema  Neurologic: Alert, oriented  Skin: No acute lesions or rashes  Access: Lt AV graft     [facility], [facility] ,TTS, Lt AVG  Basic Metabolic Panel:  Recent Labs  Lab 02/12/20 1921 02/13/20 1007  NA 142 141  K 4.3 4.5  CL 99 98  CO2 26 25  GLUCOSE 55* 88  BUN 45* 54*  CREATININE 5.98* 7.00*  CALCIUM 9.0 9.1     CBC: Recent Labs  Lab 02/12/20 1921  WBC 5.8  HGB 13.0  HCT 37.4*  MCV 94.4  PLT 153      Lab Results  Component Value Date   HEPBSAG NON REACTIVE 11/30/2019      Microbiology:  Recent Results (from the past 240 hour(s))  SARS Coronavirus 2 by RT PCR (hospital order, performed in [facility] hospital lab) Nasopharyngeal Nasopharyngeal Swab     Status: None   Collection Time: 02/13/20  7:53 AM   Specimen: Nasopharyngeal Swab  Result Value Ref Range Status   SARS Coronavirus 2 NEGATIVE  NEGATIVE Final    Comment: (NOTE) SARS-CoV-2 target nucleic acids are NOT DETECTED.  The SARS-CoV-2 RNA is generally detectable in upper and lower respiratory specimens during the acute phase of infection. The lowest concentration of SARS-CoV-2 viral copies this assay can detect is 250 copies / mL. A negative result does not preclude SARS-CoV-2 infection and should not be used as the sole basis for treatment or other patient management decisions.  A negative result may occur with improper specimen collection / handling, submission of specimen other than nasopharyngeal swab, presence of viral mutation(s) within the areas targeted by this assay, and inadequate number of viral copies (<250 copies / mL). A negative result must be combined with clinical observations, patient history, and epidemiological information.  Fact Sheet for Patients:   StrictlyIdeas.no  Fact Sheet for Healthcare Providers: BankingDealers.co.za  This test is not yet approved or  cleared by the [place] FDA and has been authorized for detection and/or diagnosis of SARS-CoV-2 by FDA under an Emergency Use Authorization (EUA).  This EUA will remain in effect (meaning this test can be used) for the duration of the COVID-19 declaration under Section 564(b)(1) of the Act, 21 U.S.C. section 360bbb-3(b)(1), unless the authorization is terminated or revoked sooner.  Performed at [facility], [address] [place], [place] [address]  Coagulation Studies: Recent Labs    02/13/20 1009  LABPROT 20.4*  INR 1.8*    Urinalysis: No results for input(s): COLORURINE, LABSPEC, PHURINE, GLUCOSEU, HGBUR, BILIRUBINUR, KETONESUR, PROTEINUR, UROBILINOGEN, NITRITE, LEUKOCYTESUR in the last 72 hours.  Invalid input(s): APPERANCEUR    Imaging: CT ABDOMEN PELVIS WO CONTRAST  Result Date: 02/13/2020 CLINICAL DATA:  Vomiting, hypoglycemia, abdominal pain,  question bowel obstruction, dialysis patient EXAM: CT ABDOMEN AND PELVIS WITHOUT CONTRAST TECHNIQUE: Multidetector CT imaging of the abdomen and pelvis was performed following the standard protocol without IV contrast. Sagittal and coronal MPR images reconstructed from axial data set. Oral contrast was not administered. COMPARISON:  12/06/2019 FINDINGS: Lower chest: Small to moderate RIGHT pleural effusion, slightly increased. Resolution of previously seen LEFT pleural effusion. Compressive atelectasis RIGHT lower lobe. Enlargement of cardiac chambers. Hepatobiliary: High attenuation material within gallbladder increased from previous exam, could represent layered calculi, milk of calcium or vicarious excretion of contrast if recently administered. No definite focal hepatic abnormality. Pancreas: Normal appearance Spleen: Normal appearance Adrenals/Urinary Tract: Thickening of adrenal glands without discrete mass. Atrophic kidneys consistent with history of dialysis. Questionable tiny hyperdense cyst 4 mm diameter at mid RIGHT kidney. No additional renal mass or hydronephrosis. Ureters and bladder decompressed. Stomach/Bowel: Mild rectal wall thickening. Appendix not visualized. Gaseous distention of sigmoid loop extending cranially to the LEFT lobe of the liver, 6.8 cm transverse. Remainder of colon decompressed. Stomach decompressed. No bowel dilatation or evidence of obstruction. Mild sigmoid diverticulosis incidentally noted without evidence of diverticulitis. Vascular/Lymphatic: Extensive atherosclerotic calcifications aorta, iliac arteries, and coronary arteries. Significant calcified plaque at the origins of the celiac, superior mesenteric, and BILATERAL renal arteries. Aorta normal caliber. No adenopathy. Reproductive: Mild prostatic enlargement. Other: Small amount of free fluid in pelvis and LEFT mid abdomen. No free air. No hernia. Musculoskeletal: Osseous demineralization. Multilevel degenerative disc  disease changes thoracolumbar spine. Chronic superior endplate concavity L1 unchanged. Degenerative changes of both hip joints. IMPRESSION: Gaseous distention of sigmoid loop extending cranially to the LEFT lobe of the liver, 6.8 cm transverse without wall thickening or obstruction. Mild rectal wall thickening question proctitis; recommend correlation with proctoscopy. Small amount of nonspecific free fluid in pelvis and LEFT mid abdomen. Small to moderate RIGHT pleural effusion, slightly increased. High attenuation material within gallbladder increased from previous exam, could represent layered calculi, milk of calcium or vicarious excretion of contrast if recently administered. Mild prostatic enlargement. Aortic Atherosclerosis (ICD10-I70.0). Electronically Signed   By: Lavonia Dana M.D.   On: 02/13/2020 08:49   DG Chest 2 View  Result Date: 02/13/2020 CLINICAL DATA:  Vomiting.  Difficulty swallowing. EXAM: CHEST - 2 VIEW COMPARISON:  CT 12/06/2019.  Chest x-ray 12/02/2019. FINDINGS: Interim removal of tracheostomy tube. Stable cardiomegaly. Bibasilar atelectasis/infiltrates again noted. Small right pleural effusion again noted. No left pleural effusion noted on today's exam. No pneumothorax. Surgical clips right neck. IMPRESSION: 1.  Interim removal of tracheostomy tube. 2.  Stable cardiomegaly. 3. Bibasilar atelectasis/infiltrates again noted. Small right pleural effusion again noted. No left pleural effusion noted on today's exam. Electronically Signed   By: Marcello Moores  Register   On: 02/13/2020 08:19   CT Head Wo Contrast  Result Date: 02/13/2020 CLINICAL DATA:  Dysphagia EXAM: CT HEAD WITHOUT CONTRAST TECHNIQUE: Contiguous axial images were obtained from the base of the skull through the vertex without intravenous contrast. COMPARISON:  November 28, 2019 FINDINGS: Brain: There is stable mild diffuse atrophy. There is no intracranial mass, hemorrhage, extra-axial fluid collection, or midline shift. Slight small  vessel disease in the centra semiovale bilaterally is stable. No acute infarct is demonstrable on this study. Vascular: No hyperdense vessel. There are foci of calcification in the carotid siphon regions bilaterally. Skull: The bony calvarium appears intact. Sinuses/Orbits: Visualized paranasal sinuses are clear. Visualized orbits appear symmetric bilaterally. Other: Mastoid air cells are clear. IMPRESSION: Atrophy with slight periventricular small vessel disease. No acute infarct. No mass or hemorrhage. There are foci of arterial vascular calcification. Electronically Signed   By: Lowella Grip III M.D.   On: 02/13/2020 08:46   CT Soft Tissue Neck Wo Contrast  Result Date: 02/13/2020 CLINICAL DATA:  Lymphadenopathy, neck. History of throat cancer, difficulty swallowing. Abdominal and throat pain. EXAM: CT NECK WITHOUT CONTRAST TECHNIQUE: Multidetector CT imaging of the neck was performed following the standard protocol without intravenous contrast. COMPARISON:  No pertinent prior exams are available for comparison. FINDINGS: Pharynx and larynx: Prior total laryngectomy. Please note there is limited assessment for residual/recurrent malignancy on this non-contrast examination. Within this limitation, no definite discrete mass is identified within the oral cavity or pharynx. There is prominent debris within the oropharynx and upper esophagus which may reflect a food bolus. There is associated mild dilation of the upper esophagus. The esophagus becomes decompressed at the C7 level. Salivary glands: There are a few nonspecific calcifications within the parotid glands bilaterally. The submandibular glands are unremarkable. Thyroid: The right thyroid lobe is poorly delineated and may be surgically absent. There are subcentimeter nodules within the left thyroid lobe not meeting consensus criteria for ultrasound follow-up. Lymph nodes: Lack of intravenous contrast administration limits evaluation for cervical  lymphadenopathy. Within this limitation, no pathologically enlarged or suspicious cervical chain lymph nodes are identified. Vascular: There is limited evaluation of the major vascular structures of the neck on this noncontrast examination. Atherosclerotic calcifications within the visualized aortic arch, proximal major branch vessels of the neck and carotid arteries. Limited intracranial: No acute intracranial abnormality is identified. Visualized orbits: Visualized orbits show no acute finding. Mastoids and visualized paranasal sinuses: Mild left sphenoid sinus mucosal thickening. No significant mastoid effusion. Skeleton: Advanced cervical spondylosis with multilevel disc space narrowing, posterior disc osteophytes, uncovertebral and facet hypertrophy. There is fusion across the C4-C5 disc space. Also of note, there is advanced C5-C6 disc degeneration with degenerative endplate irregularity and degenerative endplate sclerosis. Prominent multilevel ventral osteophytes, most notably at C5-C6 and C6-C7. Upper chest: Partially visualized right pleural effusion, likely moderate in size. There are multifocal ground-glass opacities within the imaged lung apices measuring up to 8 mm. Other: TEP device noted. IMPRESSION: Prior total laryngectomy. Within the limitations of a non-contrast examination, no definite residual/recurrent soft tissue neck mass is identified. No pathologically enlarged cervical chain lymph nodes are identified. There is prominent debris within the oropharynx and upper esophagus which may reflect a food bolus. There is associated mild dilation of the upper esophagus. Findings raise the possibility of an upper esophageal stricture with obstruction. An esophagram may be helpful for further evaluation. Prominent ventral osteophytes most notably at the C5-C6 and C6-C7 levels, which may exert some mass effect upon the upper esophagus. Multifocal ground-glass opacities within the imaged lung apices  measuring up to 8 mm. Findings may be infectious/inflammatory in etiology. However, follow-up is recommended to exclude alternative etiologies (i.e. neoplasm). Non-contrast chest CT at 3-6 months is recommended. If nodules persist, subsequent management will be based upon the most suspicious nodule(s). This recommendation follows the consensus statement: Guidelines for Management of Incidental Pulmonary Nodules Detected on CT Images:  From the Fleischner Society 2017; Radiology 2017; [phone]. Partially imaged right pleural effusion, likely moderate in size. Electronically Signed   By: Kellie Simmering DO   On: 02/13/2020 09:07     Medications:   . sodium chloride     . amiodarone  100 mg Oral Daily  . atorvastatin  80 mg Oral Daily  . chlorhexidine gluconate (MEDLINE KIT)  15 mL Mouth Rinse BID  . Chlorhexidine Gluconate Cloth  6 each Topical Q0600  . [START ON 02/14/2020] ferrous sulfate  325 mg Oral Q breakfast  . [START ON 02/14/2020] levothyroxine  50 mcg Oral Daily  . melatonin  2.5 mg Oral QHS  . midodrine  10 mg Oral TID  . multivitamin  1 tablet Oral Daily  . sevelamer carbonate  1,600 mg Oral TID WC  . sodium chloride flush  3 mL Intravenous Q12H   sodium chloride, acetaminophen **OR** acetaminophen, albuterol, dextrose, hydrALAZINE, lidocaine-prilocaine, ondansetron (ZOFRAN) IV, polyethylene glycol, sevelamer carbonate, sodium chloride flush  Assessment/ Plan:  86 y.o. male with     admitted on 02/13/2020 for nausea, vomiting and  Difficulty swallowing [R13.10]    #Dysphagia CT Neck  Findings raise the possibility of an upper esophageal stricture with obstruction.  Management  Per GI team, planning to get barium esophagram  #ESRD  Patient is on TTS schedule as outpatient Plan dialysis today Orders placed  # Diabetes Type 2 with CKD Recent A1c 5.5, well controlled.    Patient has hypoglycemia  With low blood sugar 55 mg/dl on admission -Lantus and glipizide on hold    #Hypertension -BP readings in acceptable range today -IV hydralazine as needed      LOS: 0 Oshen Wlodarczyk 9/9/20213:30 PM  [place], [place]  Note: This note was prepared with Dragon dictation. Any transcription errors are unintentional

## 2020-02-13 NOTE — ED Provider Notes (Signed)
[facility] Emergency Department Provider Note  ____________________________________________   First MD Initiated Contact with Patient 02/13/20 [phone]     (approximate)  I have reviewed the triage vital signs and the nursing notes.   HISTORY  Chief Complaint Emesis   HPI Kerry [name] is a 86 y.o. male with a past medical history of chronic hypoxic respiratory failure on 2 3 L via tracheostomy collar, V. fib arrest and A. fib on amiodarone and Eliquis as well as right IJ DVT, CHF, ESRD on HD T TH S, laryngeal carcinoma status post tracheostomy with stoma on trach collar at baseline, DM, and COPD who presents accompanied by his wife with concerns for inability to tolerate p.o. over the last 2 days.  Per patient and his wife it seems he has not been able to tolerate anything by mouth for 2 days and has been vomiting up any food he attempts to eat.  Is also been vomiting up some watery-like substance but no green or bloody vomit.  Patient notes he had a normal BM today without any blood and has been passing gas without difficulty.  He last went to dialysis 9/7 but did not go today it.  He denies any headache, earache, sore throat, fevers, chest pain, cough, shortness of breath, dental pain, diarrhea, dysuria, rash, acute extremity pain, or other acute complaints.  No personal episodes.  No clear alleviating or aggravating factors.  Patient has not had his medications this morning.       Past Medical History:  Diagnosis Date  . Cancer ([place])   . CHF (congestive heart failure) ([place])   . COPD (chronic obstructive pulmonary disease) ([place])   . Diabetes mellitus without complication ([place])   . Hypertension   . Renal disorder     Patient Active Problem List   Diagnosis Date Noted  . Hypoglycemia 02/13/2020  . COPD (chronic obstructive pulmonary disease) ([place]) 02/13/2020  . Difficulty swallowing 02/13/2020  . Nausea & vomiting 02/13/2020  . Acute on chronic  respiratory failure with hypoxia ([place])   . Acute deep vein thrombosis (DVT) of right upper extremity ([place])   . AF (paroxysmal atrial fibrillation) ([place])   . Chronic systolic CHF (congestive heart failure) ([place])   . Hypotension   . ESRD (end stage renal disease) ([place])   . Cardiac arrest ([place]) 11/28/2019  . Diverticulosis 05/25/2017  . Hyperlipidemia 05/25/2017  . Hypertension 05/25/2017  . Type 2 diabetes mellitus ([place]) 05/25/2017  . Anemia of chronic disease 04/17/2017  . History of tracheostomy 03/14/2017  . NSTEMI (non-ST elevated myocardial infarction) ([place]) 03/13/2017  . 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 Low back pain 11/23/2016  . Vitamin D insufficiency 06/28/2016  . CKD (chronic kidney disease) stage 4, GFR 15-29 ml/min (HCC) 04/05/2016  . Coronary artery disease 02/09/2016  . Heart failure with preserved ejection fraction ([place]) 02/09/2016  . 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 History of hyperkalemia 02/12/2015  . 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 Hypopharyngeal mass 11/28/2014  . Vocal cord paralysis 11/28/2014  . Sleep apnea 12/14/2011  . REM behavioral disorder 12/14/2011  . Smoker 07/22/2011    Past Surgical History:  Procedure Laterality Date  . BACK SURGERY    . TOTAL LARYNGECTOMY      Prior to Admission medications   Medication Sig Start Date End Date Taking? Authorizing Provider  amiodarone (PACERONE) 100 MG tablet Take 1 tablet (100 mg total) by  mouth daily. 12/13/19 01/12/20  Deatra James, MD  apixaban (ELIQUIS) 5 MG TABS tablet Two tablets twice a day through 12/17/2019 then one tablet twice aday afterwards 12/17/19   Loletha Grayer, MD  atorvastatin (LIPITOR) 80 MG tablet Take 80 mg by mouth daily.  03/15/17   [provider]  chlorhexidine gluconate, MEDLINE KIT, (PERIDEX) 0.12 % solution 15 mLs by Mouth Rinse route 2 (two) times daily. 12/12/19   Deatra James, MD    epoetin alfa (EPOGEN) 2000 UNIT/ML injection Inject 1 mL (2,000 Units total) into the vein Every Tuesday,Thursday,and Saturday with dialysis. 12/12/19   Shahmehdi, Valeria Batman, MD  ferrous sulfate 325 (65 FE) MG EC tablet Take 325 mg by mouth daily with breakfast.    [provider]  glucagon (GLUCAGON EMERGENCY) 1 MG injection as needed. Reported on 06/02/2015 01/19/15   [provider]  glucose blood (PRECISION QID TEST) test strip Accu check Aviva strips, use twice daily 01/16/15   [provider]  lidocaine-prilocaine (EMLA) cream Apply 1 application topically as needed (topical anesthesia for hemodialysis if Gebauers and Lidocaine injection are ineffective.). 12/12/19   Shahmehdi, Valeria Batman, MD  melatonin 5 MG TABS Take 0.5 tablets (2.5 mg total) by mouth at bedtime. 12/17/19   Loletha Grayer, MD  polyethylene glycol (MIRALAX / GLYCOLAX) 17 g packet Take 17 g by mouth daily as needed for moderate constipation. 12/17/19   Loletha Grayer, MD  sevelamer carbonate (RENVELA) 800 MG tablet Take 800-1,600 mg by mouth in the morning, at noon, in the evening, and at bedtime. 11/14/19   [provider]    Allergies Ace inhibitors, Gabapentin, and Losartan  History reviewed. No pertinent family history.  Social History Social History   Tobacco Use  . Smoking status: Former Smoker    Packs/day: 0.50    Years: 20.00    Pack years: 10.00    Types: Cigarettes    Quit date: 12/05/2014    Years since quitting: 5.1  . Smokeless tobacco: Never Used  . Tobacco comment: Quit 12/2014  Vaping Use  . Vaping Use: Never used  Substance Use Topics  . Alcohol use: No  . Drug use: No    Review of Systems  Review of Systems  Constitutional: Negative for chills and fever.  HENT: Negative for sore throat.   Eyes: Negative for pain.  Respiratory: Negative for cough and stridor.   Cardiovascular: Negative for chest pain.  Gastrointestinal: Positive for vomiting.  Genitourinary:  Negative for flank pain and hematuria.  Musculoskeletal: Negative for myalgias.  Skin: Negative for rash.  Neurological: Negative for seizures, loss of consciousness and headaches.  Psychiatric/Behavioral: Negative for suicidal ideas.  All other systems reviewed and are negative.     ____________________________________________   PHYSICAL EXAM:  VITAL SIGNS: ED Triage Vitals [02/12/20 1917]  Enc Vitals Group     BP 127/85     Pulse Rate 94     Resp 18     Temp 97.8 F (36.6 C)     Temp Source Oral     SpO2 97 %     Weight      Height      Head Circumference      Peak Flow      Pain Score      Pain Loc      Pain Edu?      Excl. in [place]?    Vitals:   02/13/20 0527 02/13/20 0742  BP: 130/65 (!) 121/91  Pulse: 76   Resp: 20   Temp:    SpO2: 98%    Physical Exam Vitals and nursing note reviewed.  Constitutional:      Appearance: He is well-developed.  HENT:     Head: Normocephalic and atraumatic.     Right Ear: External ear normal.     Left Ear: External ear normal.     Nose: Nose normal.     Mouth/Throat:     Mouth: Mucous membranes are dry.  Eyes:     Conjunctiva/sclera: Conjunctivae normal.  Cardiovascular:     Rate and Rhythm: Normal rate and regular rhythm.     Heart sounds: No murmur heard.   Pulmonary:     Effort: Pulmonary effort is normal. No respiratory distress.     Breath sounds: Normal breath sounds.  Abdominal:     Palpations: Abdomen is soft.     Tenderness: There is no abdominal tenderness.  Musculoskeletal:     Cervical back: Neck supple.  Skin:    General: Skin is warm and dry.     Capillary Refill: Capillary refill takes less than 2 seconds.  Neurological:     Mental Status: He is alert and oriented to person, place, and time.  Psychiatric:        Mood and Affect: Mood normal.     Trach collar in place. ____________________________________________   LABS (all labs ordered are listed, but only abnormal results are  displayed)  Labs Reviewed  COMPREHENSIVE METABOLIC PANEL - Abnormal; Notable for the following components:      Result Value   Glucose, Bld 55 (*)    BUN 45 (*)    Creatinine, Ser 5.98 (*)    Total Bilirubin 1.4 (*)    GFR calc non Af Amer 8 (*)    GFR calc Af Amer 9 (*)    Anion gap 17 (*)    All other components within normal limits  CBC - Abnormal; Notable for the following components:   RBC 3.96 (*)    HCT 37.4 (*)    RDW 17.1 (*)    nRBC 0.5 (*)    All other components within normal limits  GLUCOSE, CAPILLARY - Abnormal; Notable for the following components:   Glucose-Capillary 46 (*)    All other components within normal limits  GLUCOSE, CAPILLARY - Abnormal; Notable for the following components:   Glucose-Capillary 139 (*)    All other components within normal limits  GLUCOSE, CAPILLARY - Abnormal; Notable for the following components:   Glucose-Capillary 104 (*)    All other components within normal limits  TROPONIN I (HIGH SENSITIVITY) - Abnormal; Notable for the following components:   Troponin I (High Sensitivity) 47 (*)    All other components within normal limits  TROPONIN I (HIGH SENSITIVITY) - Abnormal; Notable for the following components:   Troponin I (High Sensitivity) 52 (*)    All other components within normal limits  SARS CORONAVIRUS 2 BY RT PCR (HOSPITAL ORDER, [place] [facility])  LIPASE, BLOOD  GLUCOSE, CAPILLARY  GLUCOSE, CAPILLARY  GLUCOSE, CAPILLARY  URINALYSIS, COMPLETE (UACMP) WITH MICROSCOPIC  BASIC METABOLIC PANEL  APTT  PROTIME-INR  HEPARIN LEVEL (UNFRACTIONATED)  CBC  CBG MONITORING, ED  CBG MONITORING, ED  CBG MONITORING, ED  CBG MONITORING, ED  CBG MONITORING, ED  CBG MONITORING, ED  CBG MONITORING, ED  CBG MONITORING, ED  CBG MONITORING, ED  CBG MONITORING, ED  CBG MONITORING, ED  CBG MONITORING, ED  CBG  MONITORING, ED  CBG MONITORING, ED  CBG MONITORING, ED  CBG MONITORING, ED  CBG MONITORING, ED   CBG MONITORING, ED  CBG MONITORING, ED  CBG MONITORING, ED   ____________________________________________  EKG  Sinus rhythm with a ventricular rate of 90, first-degree AV block, PVC, Q waves in the septal leads no other clear evidence of acute ischemia other than some nonspecific changes in leads II and aVL. ____________________________________________  RADIOLOGY   Official radiology report(s): CT ABDOMEN PELVIS WO CONTRAST  Result Date: 02/13/2020 CLINICAL DATA:  Vomiting, hypoglycemia, abdominal pain, question bowel obstruction, dialysis patient EXAM: CT ABDOMEN AND PELVIS WITHOUT CONTRAST TECHNIQUE: Multidetector CT imaging of the abdomen and pelvis was performed following the standard protocol without IV contrast. Sagittal and coronal MPR images reconstructed from axial data set. Oral contrast was not administered. COMPARISON:  12/06/2019 FINDINGS: Lower chest: Small to moderate RIGHT pleural effusion, slightly increased. Resolution of previously seen LEFT pleural effusion. Compressive atelectasis RIGHT lower lobe. Enlargement of cardiac chambers. Hepatobiliary: High attenuation material within gallbladder increased from previous exam, could represent layered calculi, milk of calcium or vicarious excretion of contrast if recently administered. No definite focal hepatic abnormality. Pancreas: Normal appearance Spleen: Normal appearance Adrenals/Urinary Tract: Thickening of adrenal glands without discrete mass. Atrophic kidneys consistent with history of dialysis. Questionable tiny hyperdense cyst 4 mm diameter at mid RIGHT kidney. No additional renal mass or hydronephrosis. Ureters and bladder decompressed. Stomach/Bowel: Mild rectal wall thickening. Appendix not visualized. Gaseous distention of sigmoid loop extending cranially to the LEFT lobe of the liver, 6.8 cm transverse. Remainder of colon decompressed. Stomach decompressed. No bowel dilatation or evidence of obstruction. Mild sigmoid  diverticulosis incidentally noted without evidence of diverticulitis. Vascular/Lymphatic: Extensive atherosclerotic calcifications aorta, iliac arteries, and coronary arteries. Significant calcified plaque at the origins of the celiac, superior mesenteric, and BILATERAL renal arteries. Aorta normal caliber. No adenopathy. Reproductive: Mild prostatic enlargement. Other: Small amount of free fluid in pelvis and LEFT mid abdomen. No free air. No hernia. Musculoskeletal: Osseous demineralization. Multilevel degenerative disc disease changes thoracolumbar spine. Chronic superior endplate concavity L1 unchanged. Degenerative changes of both hip joints. IMPRESSION: Gaseous distention of sigmoid loop extending cranially to the LEFT lobe of the liver, 6.8 cm transverse without wall thickening or obstruction. Mild rectal wall thickening question proctitis; recommend correlation with proctoscopy. Small amount of nonspecific free fluid in pelvis and LEFT mid abdomen. Small to moderate RIGHT pleural effusion, slightly increased. High attenuation material within gallbladder increased from previous exam, could represent layered calculi, milk of calcium or vicarious excretion of contrast if recently administered. Mild prostatic enlargement. Aortic Atherosclerosis (ICD10-I70.0). Electronically Signed   By: Lavonia Dana M.D.   On: 02/13/2020 08:49   DG Chest 2 View  Result Date: 02/13/2020 CLINICAL DATA:  Vomiting.  Difficulty swallowing. EXAM: CHEST - 2 VIEW COMPARISON:  CT 12/06/2019.  Chest x-ray 12/02/2019. FINDINGS: Interim removal of tracheostomy tube. Stable cardiomegaly. Bibasilar atelectasis/infiltrates again noted. Small right pleural effusion again noted. No left pleural effusion noted on today's exam. No pneumothorax. Surgical clips right neck. IMPRESSION: 1.  Interim removal of tracheostomy tube. 2.  Stable cardiomegaly. 3. Bibasilar atelectasis/infiltrates again noted. Small right pleural effusion again noted. No  left pleural effusion noted on today's exam. Electronically Signed   By: Marcello Moores  Register   On: 02/13/2020 08:19   CT Head Wo Contrast  Result Date: 02/13/2020 CLINICAL DATA:  Dysphagia EXAM: CT HEAD WITHOUT CONTRAST TECHNIQUE: Contiguous axial images were obtained from the base of the skull  through the vertex without intravenous contrast. COMPARISON:  November 28, 2019 FINDINGS: Brain: There is stable mild diffuse atrophy. There is no intracranial mass, hemorrhage, extra-axial fluid collection, or midline shift. Slight small vessel disease in the centra semiovale bilaterally is stable. No acute infarct is demonstrable on this study. Vascular: No hyperdense vessel. There are foci of calcification in the carotid siphon regions bilaterally. Skull: The bony calvarium appears intact. Sinuses/Orbits: Visualized paranasal sinuses are clear. Visualized orbits appear symmetric bilaterally. Other: Mastoid air cells are clear. IMPRESSION: Atrophy with slight periventricular small vessel disease. No acute infarct. No mass or hemorrhage. There are foci of arterial vascular calcification. Electronically Signed   By: Lowella Grip III M.D.   On: 02/13/2020 08:46   CT Soft Tissue Neck Wo Contrast  Result Date: 02/13/2020 CLINICAL DATA:  Lymphadenopathy, neck. History of throat cancer, difficulty swallowing. Abdominal and throat pain. EXAM: CT NECK WITHOUT CONTRAST TECHNIQUE: Multidetector CT imaging of the neck was performed following the standard protocol without intravenous contrast. COMPARISON:  No pertinent prior exams are available for comparison. FINDINGS: Pharynx and larynx: Prior total laryngectomy. Please note there is limited assessment for residual/recurrent malignancy on this non-contrast examination. Within this limitation, no definite discrete mass is identified within the oral cavity or pharynx. There is prominent debris within the oropharynx and upper esophagus which may reflect a food bolus. There is  associated mild dilation of the upper esophagus. The esophagus becomes decompressed at the C7 level. Salivary glands: There are a few nonspecific calcifications within the parotid glands bilaterally. The submandibular glands are unremarkable. Thyroid: The right thyroid lobe is poorly delineated and may be surgically absent. There are subcentimeter nodules within the left thyroid lobe not meeting consensus criteria for ultrasound follow-up. Lymph nodes: Lack of intravenous contrast administration limits evaluation for cervical lymphadenopathy. Within this limitation, no pathologically enlarged or suspicious cervical chain lymph nodes are identified. Vascular: There is limited evaluation of the major vascular structures of the neck on this noncontrast examination. Atherosclerotic calcifications within the visualized aortic arch, proximal major branch vessels of the neck and carotid arteries. Limited intracranial: No acute intracranial abnormality is identified. Visualized orbits: Visualized orbits show no acute finding. Mastoids and visualized paranasal sinuses: Mild left sphenoid sinus mucosal thickening. No significant mastoid effusion. Skeleton: Advanced cervical spondylosis with multilevel disc space narrowing, posterior disc osteophytes, uncovertebral and facet hypertrophy. There is fusion across the C4-C5 disc space. Also of note, there is advanced C5-C6 disc degeneration with degenerative endplate irregularity and degenerative endplate sclerosis. Prominent multilevel ventral osteophytes, most notably at C5-C6 and C6-C7. Upper chest: Partially visualized right pleural effusion, likely moderate in size. There are multifocal ground-glass opacities within the imaged lung apices measuring up to 8 mm. Other: TEP device noted. IMPRESSION: Prior total laryngectomy. Within the limitations of a non-contrast examination, no definite residual/recurrent soft tissue neck mass is identified. No pathologically enlarged  cervical chain lymph nodes are identified. There is prominent debris within the oropharynx and upper esophagus which may reflect a food bolus. There is associated mild dilation of the upper esophagus. Findings raise the possibility of an upper esophageal stricture with obstruction. An esophagram may be helpful for further evaluation. Prominent ventral osteophytes most notably at the C5-C6 and C6-C7 levels, which may exert some mass effect upon the upper esophagus. Multifocal ground-glass opacities within the imaged lung apices measuring up to 8 mm. Findings may be infectious/inflammatory in etiology. However, follow-up is recommended to exclude alternative etiologies (i.e. neoplasm). Non-contrast chest CT at 3-6  months is recommended. If nodules persist, subsequent management will be based upon the most suspicious nodule(s). This recommendation follows the consensus statement: Guidelines for Management of Incidental Pulmonary Nodules Detected on CT Images: From the Fleischner Society 2017; Radiology 2017; [id number]:228-243. Partially imaged right pleural effusion, likely moderate in size. Electronically Signed   By: Kellie Simmering DO   On: 02/13/2020 09:07    ____________________________________________   PROCEDURES  Procedure(s) performed (including Critical Care):  Procedures   ____________________________________________   INITIAL IMPRESSION / ASSESSMENT AND PLAN / ED COURSE        Patient presents with [place] to history exam approximately 2 days of vomiting and spitting up whenever he tries to take anything by mouth.  Afebrile and hemodynamically stable arrival.  Overall patient's history, exam, and ED work-up is most consistent with likely esophageal blockage either from a stricture or food bolus from several days ago.  No focal deficits on exam finding on CT head to suggest a subacute stroke.  No historical or CT abdomen pelvis findings to suggest SBO.  Chest x-ray is unremarkable and there is no  findings on vital signs, exam, and work-up of an acute infectious process.  Patient was noted to be hyperglycemic on initial labs and while in the ED his blood sugar was rechecked several times and he was given D50 solution after initial sugar with resolution of hypoglycemia.  I did discuss above CT neck findings with on-call gastroenterologist who recommended EGD likely this afternoon or early tomorrow morning pending patient's dialysis treatment due today.  Will plan to admit to hospitalist service for observation.   ____________________________________________   FINAL CLINICAL IMPRESSION(S) / ED DIAGNOSES  Final diagnoses:  Vomiting, intractability of vomiting not specified, presence of nausea not specified, unspecified vomiting type  Hypoglycemia  Esophageal dysphagia    Medications  amiodarone (PACERONE) tablet 100 mg (has no administration in time range)  sodium chloride flush (NS) 0.9 % injection 3 mL (3 mLs Intravenous Not Given 02/13/20 0955)  sodium chloride flush (NS) 0.9 % injection 3 mL (has no administration in time range)  0.9 %  sodium chloride infusion (has no administration in time range)  dextrose 50 % solution 50 mL (has no administration in time range)  ondansetron (ZOFRAN) injection 4 mg (has no administration in time range)  hydrALAZINE (APRESOLINE) injection 5 mg (has no administration in time range)  acetaminophen (TYLENOL) tablet 650 mg (has no administration in time range)    Or  acetaminophen (TYLENOL) suppository 650 mg (has no administration in time range)  dextrose 50 % solution 50 mL (50 mLs Intravenous Given 02/12/20 1924)     ED Discharge Orders    None       Note:  This document was prepared using Dragon voice recognition software and may include unintentional dictation errors.   Lucrezia Starch, MD 02/13/20 1025

## 2020-02-13 NOTE — Consult Note (Addendum)
[facility]  Patient Description:Kerry [name] is a 86 year old with a PMH significant for chronic systolic CHF (EF= 87-56% from 11/30/19 Echo), paroxysmal atrial fibrillation (on eliquis), CAD, h/o NSTEMI, h/o cardiac arrest,  DVT of the RUJ, laryngeal carcinoma, s/p tracheostomy with stoma on trach collar, ESRD (dialysis TThS), HTN, HLD, DM Type II, COPD, chronic hypoxic respiratory failure (on 3L of supplement O2), anemia and OSA who was admitted for dysphagia.   SUBJECTIVE: The patient reports to be doing well on today. He reports that his dyspnea is back to his baseline and he denies any chest pain, dizziness, lightheadedness or peripheral edema at this time. He denies any issues with swallowing at this time, but continues to have a fullness sensation in this throat.   02/13/2020-The patient is unable to verbally speak but used nonverbal communication to answer most questions. The patient's wife is at the bedside as well and some of the information was obtained from her. The patient reports to have dyspnea that is worse beyond his baseline which has been present since the onset of the dysphagia. The patient denies all chest pain, palpitations, peripheral edema, syncope or lightheadedness at this time. He reports that he was given his amiodarone this morning, however, he was unable to take all of his other medications while he was home due to the dysphagia.   OBJECTIVE: The patient continues to appear chronically ill, he is sitting on the side of the bed undergoing a swallow evaluation performed by Speech Therapy. The patient is in NSR with at HR of 79 bpm on the telemetry monitor and this vital signs are stable. The patient underwent an EGD and dialysis on today. The EGD revealed no evidence of an esophageal stricture and the end of the "tracheostomy tube" seen in the proximal esophagus which is the TEP plate as the patient no longer has a tracheostomy tube and has only a stoma.    02/13/20-the patient  appears chronically ill but is not in any acute distress. He is on 2 L of supplemental osygen via trach mask and his oxygen saturation is at 96%. The patient's telemetry monitor reveal sinus arrhythmia with occasional PVC's. He is normotensive and appears euvolemic.   Vitals:   02/18/20 1430 02/18/20 1445 02/18/20 1447 02/18/20 1548  BP: (!) 95/52 97/71 97/71  116/68  Pulse: 62 (!) 59 (!) 58 64  Resp:    (!) 22  Temp:    97.8 F (36.6 C)  TempSrc:    Oral  SpO2: 98% 90% 95% 100%  Weight:      Height:         Intake/Output Summary (Last 24 hours) at 02/18/2020 1815 Last data filed at 02/18/2020 1447 Gross per 24 hour  Intake 554.16 ml  Output 2000 ml  Net -1445.84 ml      PHYSICAL EXAM  General: chronically ill, adequately nourished, in no acute distress HEENT:  Normocephalic and atraumatic. PERRLA Neck:   No JVD. Tracheostomy in place.  Lungs: Clear bilaterally to auscultation, normal effort of breathing, chest expansion symmetrical. No rhonchi, rales, crackles or wheezes auscultated.  Heart: HRRR . Normal S1 and S2 without gallops or murmurs.  Abdomen: Bowel sounds are positive, abdomen soft and non-tender  Msk:  Diminished strength for age. Extremities: No clubbing, cyanosis or edema.   Neuro: Alert and oriented X 3. Psych:  Good affect, responds appropriately (nonverbally due to tracheostomy)   LABS: Basic Metabolic Panel: Recent Labs    02/17/20 0454 02/18/20 0550  NA 134*  131*  K 3.3* 3.3*  CL 95* 94*  CO2 26 25  GLUCOSE 115* 149*  BUN 26* 31*  CREATININE 6.12* 7.33*  CALCIUM 8.6* 8.6*  MG 1.9  --    Liver Function Tests: No results for input(s): AST, ALT, ALKPHOS, BILITOT, PROT, ALBUMIN in the last 72 hours. No results for input(s): LIPASE, AMYLASE in the last 72 hours. CBC: Recent Labs    02/17/20 0454 02/18/20 0550  WBC 3.8* 3.6*  HGB 12.1* 12.2*  HCT 33.4* 33.8*  MCV 89.5 89.7  PLT 95* 95*   Cardiac Enzymes: No results for input(s):  CKTOTAL, CKMB, CKMBINDEX, TROPONINI in the last 72 hours. BNP: Invalid input(s): POCBNP D-Dimer: No results for input(s): DDIMER in the last 72 hours. Hemoglobin A1C: No results for input(s): HGBA1C in the last 72 hours. Fasting Lipid Panel: No results for input(s): CHOL, HDL, LDLCALC, TRIG, CHOLHDL, LDLDIRECT in the last 72 hours. Thyroid Function Tests: No results for input(s): TSH, T4TOTAL, T3FREE, THYROIDAB in the last 72 hours.  Invalid input(s): FREET3 Anemia Panel: No results for input(s): VITAMINB12, FOLATE, FERRITIN, TIBC, IRON, RETICCTPCT in the last 72 hours.  No results found.   Echo: severely decreased LV function with and estimated EF of 25-30% and grade I diastolic dysfunction.  TELEMETRY: NSR with HR of 79 bpm  ASSESSMENT AND PLAN:  Principal Problem:   Difficulty swallowing Active Problems:   Anemia of chronic disease   Coronary artery disease   Hyperlipidemia   Hypertension   Squamous cell cancer of hypopharynx (HCC)   Acute deep vein thrombosis (DVT) of right upper extremity (HCC)   AF (paroxysmal atrial fibrillation) (HCC)   Chronic systolic CHF (congestive heart failure) (HCC)   ESRD (end stage renal disease) (HCC)   Hypoglycemia   COPD (chronic obstructive pulmonary disease) (HCC)   Type II diabetes mellitus with renal manifestations (HCC)   Protein-calorie malnutrition, severe    PLAN:  1. Paroxsymal atrial fibrillation, reasonably controlled, rate controlled             -Continue oral Amiodarone as tolerated.              -Agree with holding eliquis at this time. Consider restarting in two days.   2. CAD, reasonably stable             -High sensitivity troponin=74>>52; unremarkable             -We will continue statin therapy.              -Patient is a poor beta blocker candidate.    3. Chronic systolic CHF (IE=33-29%), compensated, the patient appears euvolemic             -Recommend daily weights and strict I&O's.              -Recommend [name] hydration with IV fluids if necessary.              -We will not repeat Echo at this time due to most recent being 11/2019   4. Dysphagia, unstable              -EGD negative for esophageal stricture.                 -agree with holding eliquis at this time.   5. HTN with recent Hypotension at home, fairly stable             -Recommend restarting midodrine at his home dosage.              -  Vitals q4h.   6. Anemia, stable, hemoglobin level is current 12.2             -recommend trending CBC.  8. HLD, stable             -Continue management with lipitor  9. Acute DVT of the RIJ, fairly stable             -Agree with holding eliquis at this time.  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   10. ESRD, on dialysis, fairly stable              -Nephrology consult appreciated.   -Dialysis completed on today.   11. COPD with chronic hypoxic respiratory failure, fairly stable patient's O2 saturation is at 96% on 2L of O2 via trach mask.              -Recommend continuing supplement oxygen at 2-3L per home management.   -RT management appreciated.   12. DM Type II with recent episodes of hypoglycemia, stable at this time             -agree with holding long acting insulin and glipizide at this time due to hypoglycemia.              -recommend CBG monitoring every 4 hours.   13. Laryngeal carcinoma s/p tracheostomy with stoma (on trach collar), stable             -agree with Oncology management as outpatient   Aylah Yeary, ACNPC-AG  02/18/2020 6:15 PM

## 2020-02-13 NOTE — Progress Notes (Signed)
Hemodialysis patient known at [facility] 11:00am, per clinic patient normally self transports or wife will sometimes transport. Please let me know if there will be any change in this current plan at discharge.   Elvera Bicker Dialysis Coordinator [phone]

## 2020-02-13 NOTE — ED Notes (Signed)
Pt did not receive any po meds from this rn from [facility], md aware pt is unable to keep anything down at this time.

## 2020-02-13 NOTE — Progress Notes (Addendum)
ANTICOAGULATION CONSULT NOTE - Initial Consult  Pharmacy Consult for Heparin Infusion Dosing  Indication: atrial fibrillation and DVT  Allergies  Allergen Reactions  . Ace Inhibitors Other (See Comments)    Potassium elevated  . Gabapentin Other (See Comments)    Ataxia at 100 mg.  . Losartan Other (See Comments)    Hyperkalemia    Patient Measurements: Weight: 57.7 kg (127 lb 3.3 oz)  Heparin dosing wt 57.7kg   Vital Signs: BP: 136/85 (09/09 1049) Pulse Rate: 76 (09/09 0527)  Labs: Recent Labs    02/12/20 1921 02/12/20 2133 02/13/20 1007 02/13/20 1009  HGB 13.0  --   --   --   HCT 37.4*  --   --   --   PLT 153  --   --   --   APTT  --   --   --  51*  LABPROT  --   --   --  20.4*  INR  --   --   --  1.8*  HEPARINUNFRC  --   --   --  >3.60*  CREATININE 5.98*  --  7.00*  --   TROPONINIHS 47* 52*  --   --     CrCl cannot be calculated (Unknown ideal weight.).   Medical History: Past Medical History:  Diagnosis Date  . Cancer ([place])   . CHF (congestive heart failure) ([place])   . COPD (chronic obstructive pulmonary disease) ([place])   . Diabetes mellitus without complication ([place])   . Hypertension   . Renal disorder     Assessment: 86 yo male with PMH of RUE DVT and A. Fib. Patient taking Apixaban (Eliquis) PTA, but presents with difficulty swallowing, nausea vomiting. Pharmacy consulted for heparin infusion dosing and monitoring. Patient reports last dose of apixaban 9/8 AM, but he threw up shortly after. Baseline aPTT, INR, and anti-Xa level elevated.   Goal of Therapy:  Heparin level 0.3-0.7 units/ml aPTT 66-102 seconds Monitor platelets by anticoagulation protocol: Yes   Plan:  Start heparin infusion at 950 units/hr (delayed initiating due to pt wt unclear) Will need to dose base on aPTT levels until aPTT and anti-Xa correlate.  Check aPTT level in 8 hours and anti-Xa daily while on heparin Continue to monitor H&H and platelets  Lu Duffel,  PharmD, BCPS Clinical Pharmacist 02/13/2020 3:32 PM

## 2020-02-13 NOTE — H&P (Addendum)
History and Physical    Kerry [name] JJH:[id number] DOB: [birth year] DOA: 02/13/2020  Referring MD/NP/PA:   PCP: Cecile Sheerer, MD   Patient coming from:  The patient is coming from home.  At baseline, pt is independent for most of ADL.        Chief Complaint: Nausea, vomiting, difficulty swallowing  HPI: Kerry [name] is a 86 y.o. 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  Per patient's wife, patient has difficult swallowing in the past 2 days with nausea and vomiting. Pt has not been able to tolerate anything by mouth for 2 days. Pt has been vomiting up any food he attempts to eat.    Patient does not have abdominal pain or diarrhea.  Patient has some mild cough and mild shortness of breath at baseline, which has not changed.  No chest pain.  Denies fever or chills.  No symptoms of UTI or unilateral weakness. Patient had a dialysis on Tuesday, is due for dialysis today. Patient has not had his medications this morning.  ED Course: pt was found to have WBC 5.8, negative Covid PCR, potassium 4.3, creatinine 5.98, BUN 45, temperature normal, blood pressure 121/91, heart rate 94-76, RR 20, oxygen saturation 98% on room air.  CT of head is negative for acute intracranial abnormalities. Pt is placed on MedSurg bed for observation.  Dr. Marius Ditch of GI and Dr. Candiss Norse of nephrology were consulted.     CT-neck soft tissue: Prior total laryngectomy. Within the limitations of a non-contrast examination, no definite residual/recurrent soft tissue neck mass is identified. No pathologically enlarged cervical chain lymph nodes are identified.  There is prominent debris within the oropharynx and upper esophagus which may reflect a food  bolus. There is associated mild dilation of the upper esophagus. Findings raise the possibility of an upper esophageal stricture with obstruction. An esophagram may be helpful for further evaluation.  Prominent ventral osteophytes most notably at the C5-C6 and C6-C7 levels, which may exert some mass effect upon the upper esophagus.  Multifocal ground-glass opacities within the imaged lung apices measuring up to 8 mm. Findings may be infectious/inflammatory in etiology. However, follow-up is recommended to exclude alternative etiologies (i.e. neoplasm). Non-contrast chest CT at 3-6 months is recommended. If nodules persist, subsequent management will be based upon the most suspicious nodule(s). This recommendation follows the consensus statement: Guidelines for Management of Incidental Pulmonary Nodules Detected on CT Images: From the Fleischner Society 2017; Radiology 2017; 284:228-243.  Partially imaged right pleural effusion, likely moderate in size   CT-add/pevlis: Gaseous distention of sigmoid loop extending cranially to the LEFT lobe of the liver, 6.8 cm transverse without wall thickening or obstruction.  Mild rectal wall thickening question proctitis; recommend correlation with proctoscopy.  Small amount of nonspecific free fluid in pelvis and LEFT mid abdomen.  Small to moderate RIGHT pleural effusion, slightly increased.  High attenuation material within gallbladder increased from previous exam, could represent layered calculi, milk of calcium or vicarious excretion of contrast if recently administered.  Mild prostatic enlargement.  Aortic Atherosclerosis (ICD10-I70.0).     Review of Systems:   General: no fevers, chills, no body weight gain, has poor appetite, has fatigue HEENT: no blurry vision, hearing changes or sore throat Respiratory: no dyspnea, coughing,  wheezing CV: no chest pain, no palpitations GI: no nausea, vomiting, abdominal pain,  diarrhea, constipation GU: no dysuria, burning on urination, increased urinary frequency, hematuria  Ext: no leg edema Neuro: no unilateral weakness, numbness, or tingling, no vision change or hearing loss Skin: no rash, no skin tear. MSK: No muscle spasm, no deformity, no limitation of range of movement in spin Heme: No easy bruising.  Travel history: No recent long distant travel.  Allergy:  Allergies  Allergen Reactions  . Ace Inhibitors Other (See Comments)    Potassium elevated  . Gabapentin Other (See Comments)    Ataxia at 100 mg.  . Losartan Other (See Comments)    Hyperkalemia    Past Medical History:  Diagnosis Date  . Cancer ([place])   . CHF (congestive heart failure) ([place])   . COPD (chronic obstructive pulmonary disease) ([place])   . Diabetes mellitus without complication ([place])   . Hypertension   . Renal disorder     Past Surgical History:  Procedure Laterality Date  . BACK SURGERY    . TOTAL LARYNGECTOMY      Social History:  reports that he quit smoking about 5 years ago. His smoking use included cigarettes. He has a 10.00 pack-year smoking history. He has never used smokeless tobacco. He reports that he does not drink alcohol and does not use drugs.  Family History:  Family History  Problem Relation Age of Onset  . CAD Sister      Prior to Admission medications   Medication Sig Start Date End Date Taking? Authorizing Provider  amiodarone (PACERONE) 100 MG tablet Take 1 tablet (100 mg total) by mouth daily. 12/13/19 01/12/20  Deatra James, MD  apixaban (ELIQUIS) 5 MG TABS tablet Two tablets twice a day through 12/17/2019 then one tablet twice aday afterwards 12/17/19   Loletha Grayer, MD  atorvastatin (LIPITOR) 80 MG tablet Take 80 mg by mouth daily.  03/15/17   [provider]  chlorhexidine gluconate, MEDLINE KIT, (PERIDEX) 0.12 % solution 15 mLs by Mouth Rinse route 2 (two) times daily. 12/12/19   Deatra James, MD  epoetin alfa (EPOGEN) 2000  UNIT/ML injection Inject 1 mL (2,000 Units total) into the vein Every Tuesday,Thursday,and Saturday with dialysis. 12/12/19   Shahmehdi, Valeria Batman, MD  ferrous sulfate 325 (65 FE) MG EC tablet Take 325 mg by mouth daily with breakfast.    [provider]  glucagon (GLUCAGON EMERGENCY) 1 MG injection as needed. Reported on 06/02/2015 01/19/15   [provider]  glucose blood (PRECISION QID TEST) test strip Accu check Aviva strips, use twice daily 01/16/15   [provider]  lidocaine-prilocaine (EMLA) cream Apply 1 application topically as needed (topical anesthesia for hemodialysis if Gebauers and Lidocaine injection are ineffective.). 12/12/19   Shahmehdi, Valeria Batman, MD  melatonin 5 MG TABS Take 0.5 tablets (2.5 mg total) by mouth at bedtime. 12/17/19   Loletha Grayer, MD  polyethylene glycol (MIRALAX / GLYCOLAX) 17 g packet Take 17 g by mouth daily as needed for moderate constipation. 12/17/19   Loletha Grayer, MD  sevelamer carbonate (RENVELA) 800 MG tablet Take 800-1,600 mg by mouth in the morning, at noon, in the evening, and at bedtime. 11/14/19   [provider]    Physical Exam: Vitals:   02/12/20 2356 02/13/20 0527 02/13/20 0742 02/13/20 1049  BP: 124/77 130/65 (!) 121/91 136/85  Pulse: 79 76    Resp: 16 20  (!) 29  Temp: 98 F (36.7 C)  TempSrc:      SpO2: 98% 98%     General: Not in acute distress HEENT: s/p of  tracheostomy with stoma on trach collar       Eyes: PERRL, EOMI, no scleral icterus.       ENT: No discharge from the ears and nose, no pharynx injection, no tonsillar enlargement.        Neck: No JVD, no bruit, no mass felt. Heme: No neck lymph node enlargement. Cardiac: S1/S2, RRR, No murmurs, No gallops or rubs. Respiratory: No rales, wheezing, rhonchi or rubs. GI: Soft, nondistended, nontender, no rebound pain, no organomegaly, BS present. GU: No hematuria Ext: No pitting leg edema bilaterally. 1+DP/PT pulse  bilaterally. Musculoskeletal: No joint deformities, No joint redness or warmth, no limitation of ROM in spin. Skin: No rashes.  Neuro: Alert, oriented X3, cranial nerves II-XII grossly intact, moves all extremities normally.  Psych: Patient is not psychotic, no suicidal or hemocidal ideation.  Labs on Admission: I have personally reviewed following labs and imaging studies  CBC: Recent Labs  Lab 02/12/20 1921  WBC 5.8  HGB 13.0  HCT 37.4*  MCV 94.4  PLT 275   Basic Metabolic Panel: Recent Labs  Lab 02/12/20 1921 02/13/20 1007  NA 142 141  K 4.3 4.5  CL 99 98  CO2 26 25  GLUCOSE 55* 88  BUN 45* 54*  CREATININE 5.98* 7.00*  CALCIUM 9.0 9.1   GFR: CrCl cannot be calculated (Unknown ideal weight.). Liver Function Tests: Recent Labs  Lab 02/12/20 1921  AST 19  ALT 21  ALKPHOS 54  BILITOT 1.4*  PROT 7.6  ALBUMIN 4.3   Recent Labs  Lab 02/12/20 1921  LIPASE 26   No results for input(s): AMMONIA in the last 168 hours. Coagulation Profile: Recent Labs  Lab 02/13/20 1009  INR 1.8*   Cardiac Enzymes: No results for input(s): CKTOTAL, CKMB, CKMBINDEX, TROPONINI in the last 168 hours. BNP (last 3 results) No results for input(s): PROBNP in the last 8760 hours. HbA1C: No results for input(s): HGBA1C in the last 72 hours. CBG: Recent Labs  Lab 02/12/20 2137 02/13/20 0247 02/13/20 0550 02/13/20 0733 02/13/20 1004  GLUCAP 139* 104* 71 72 82   Lipid Profile: No results for input(s): CHOL, HDL, LDLCALC, TRIG, CHOLHDL, LDLDIRECT in the last 72 hours. Thyroid Function Tests: No results for input(s): TSH, T4TOTAL, FREET4, T3FREE, THYROIDAB in the last 72 hours. Anemia Panel: No results for input(s): VITAMINB12, FOLATE, FERRITIN, TIBC, IRON, RETICCTPCT in the last 72 hours. Urine analysis: No results found for: COLORURINE, APPEARANCEUR, LABSPEC, PHURINE, GLUCOSEU, HGBUR, BILIRUBINUR, KETONESUR, PROTEINUR, UROBILINOGEN, NITRITE, LEUKOCYTESUR Sepsis  Labs: @LABRCNTIP (procalcitonin:4,lacticidven:4) ) Recent Results (from the past 240 hour(s))  SARS Coronavirus 2 by RT PCR (hospital order, performed in [facility] hospital lab) Nasopharyngeal Nasopharyngeal Swab     Status: None   Collection Time: 02/13/20  7:53 AM   Specimen: Nasopharyngeal Swab  Result Value Ref Range Status   SARS Coronavirus 2 NEGATIVE NEGATIVE Final    Comment: (NOTE) SARS-CoV-2 target nucleic acids are NOT DETECTED.  The SARS-CoV-2 RNA is generally detectable in upper and lower respiratory specimens during the acute phase of infection. The lowest concentration of SARS-CoV-2 viral copies this assay can detect is 250 copies / mL. A negative result does not preclude SARS-CoV-2 infection and should not be used as the sole basis for treatment or other patient management decisions.  A negative result may occur with improper specimen collection / handling, submission of specimen  other than nasopharyngeal swab, presence of viral mutation(s) within the areas targeted by this assay, and inadequate number of viral copies (<250 copies / mL). A negative result must be combined with clinical observations, patient history, and epidemiological information.  Fact Sheet for Patients:   StrictlyIdeas.no  Fact Sheet for Healthcare Providers: BankingDealers.co.za  This test is not yet approved or  cleared by the [place] FDA and has been authorized for detection and/or diagnosis of SARS-CoV-2 by FDA under an Emergency Use Authorization (EUA).  This EUA will remain in effect (meaning this test can be used) for the duration of the COVID-19 declaration under Section 564(b)(1) of the Act, 21 U.S.C. section 360bbb-3(b)(1), unless the authorization is terminated or revoked sooner.  Performed at [facility], [address] [place], [place] [address]      Radiological Exams on Admission: CT ABDOMEN PELVIS WO  CONTRAST  Result Date: 02/13/2020 CLINICAL DATA:  Vomiting, hypoglycemia, abdominal pain, question bowel obstruction, dialysis patient EXAM: CT ABDOMEN AND PELVIS WITHOUT CONTRAST TECHNIQUE: Multidetector CT imaging of the abdomen and pelvis was performed following the standard protocol without IV contrast. Sagittal and coronal MPR images reconstructed from axial data set. Oral contrast was not administered. COMPARISON:  12/06/2019 FINDINGS: Lower chest: Small to moderate RIGHT pleural effusion, slightly increased. Resolution of previously seen LEFT pleural effusion. Compressive atelectasis RIGHT lower lobe. Enlargement of cardiac chambers. Hepatobiliary: High attenuation material within gallbladder increased from previous exam, could represent layered calculi, milk of calcium or vicarious excretion of contrast if recently administered. No definite focal hepatic abnormality. Pancreas: Normal appearance Spleen: Normal appearance Adrenals/Urinary Tract: Thickening of adrenal glands without discrete mass. Atrophic kidneys consistent with history of dialysis. Questionable tiny hyperdense cyst 4 mm diameter at mid RIGHT kidney. No additional renal mass or hydronephrosis. Ureters and bladder decompressed. Stomach/Bowel: Mild rectal wall thickening. Appendix not visualized. Gaseous distention of sigmoid loop extending cranially to the LEFT lobe of the liver, 6.8 cm transverse. Remainder of colon decompressed. Stomach decompressed. No bowel dilatation or evidence of obstruction. Mild sigmoid diverticulosis incidentally noted without evidence of diverticulitis. Vascular/Lymphatic: Extensive atherosclerotic calcifications aorta, iliac arteries, and coronary arteries. Significant calcified plaque at the origins of the celiac, superior mesenteric, and BILATERAL renal arteries. Aorta normal caliber. No adenopathy. Reproductive: Mild prostatic enlargement. Other: Small amount of free fluid in pelvis and LEFT mid abdomen. No free  air. No hernia. Musculoskeletal: Osseous demineralization. Multilevel degenerative disc disease changes thoracolumbar spine. Chronic superior endplate concavity L1 unchanged. Degenerative changes of both hip joints. IMPRESSION: Gaseous distention of sigmoid loop extending cranially to the LEFT lobe of the liver, 6.8 cm transverse without wall thickening or obstruction. Mild rectal wall thickening question proctitis; recommend correlation with proctoscopy. Small amount of nonspecific free fluid in pelvis and LEFT mid abdomen. Small to moderate RIGHT pleural effusion, slightly increased. High attenuation material within gallbladder increased from previous exam, could represent layered calculi, milk of calcium or vicarious excretion of contrast if recently administered. Mild prostatic enlargement. Aortic Atherosclerosis (ICD10-I70.0). Electronically Signed   By: Lavonia Dana M.D.   On: 02/13/2020 08:49   DG Chest 2 View  Result Date: 02/13/2020 CLINICAL DATA:  Vomiting.  Difficulty swallowing. EXAM: CHEST - 2 VIEW COMPARISON:  CT 12/06/2019.  Chest x-ray 12/02/2019. FINDINGS: Interim removal of tracheostomy tube. Stable cardiomegaly. Bibasilar atelectasis/infiltrates again noted. Small right pleural effusion again noted. No left pleural effusion noted on today's exam. No pneumothorax. Surgical clips right neck. IMPRESSION: 1.  Interim removal of tracheostomy tube.  2.  Stable cardiomegaly. 3. Bibasilar atelectasis/infiltrates again noted. Small right pleural effusion again noted. No left pleural effusion noted on today's exam. Electronically Signed   By: Marcello Moores  Register   On: 02/13/2020 08:19   CT Head Wo Contrast  Result Date: 02/13/2020 CLINICAL DATA:  Dysphagia EXAM: CT HEAD WITHOUT CONTRAST TECHNIQUE: Contiguous axial images were obtained from the base of the skull through the vertex without intravenous contrast. COMPARISON:  November 28, 2019 FINDINGS: Brain: There is stable mild diffuse atrophy. There is no  intracranial mass, hemorrhage, extra-axial fluid collection, or midline shift. Slight small vessel disease in the centra semiovale bilaterally is stable. No acute infarct is demonstrable on this study. Vascular: No hyperdense vessel. There are foci of calcification in the carotid siphon regions bilaterally. Skull: The bony calvarium appears intact. Sinuses/Orbits: Visualized paranasal sinuses are clear. Visualized orbits appear symmetric bilaterally. Other: Mastoid air cells are clear. IMPRESSION: Atrophy with slight periventricular small vessel disease. No acute infarct. No mass or hemorrhage. There are foci of arterial vascular calcification. Electronically Signed   By: Lowella Grip III M.D.   On: 02/13/2020 08:46   CT Soft Tissue Neck Wo Contrast  Result Date: 02/13/2020 CLINICAL DATA:  Lymphadenopathy, neck. History of throat cancer, difficulty swallowing. Abdominal and throat pain. EXAM: CT NECK WITHOUT CONTRAST TECHNIQUE: Multidetector CT imaging of the neck was performed following the standard protocol without intravenous contrast. COMPARISON:  No pertinent prior exams are available for comparison. FINDINGS: Pharynx and larynx: Prior total laryngectomy. Please note there is limited assessment for residual/recurrent malignancy on this non-contrast examination. Within this limitation, no definite discrete mass is identified within the oral cavity or pharynx. There is prominent debris within the oropharynx and upper esophagus which may reflect a food bolus. There is associated mild dilation of the upper esophagus. The esophagus becomes decompressed at the C7 level. Salivary glands: There are a few nonspecific calcifications within the parotid glands bilaterally. The submandibular glands are unremarkable. Thyroid: The right thyroid lobe is poorly delineated and may be surgically absent. There are subcentimeter nodules within the left thyroid lobe not meeting consensus criteria for ultrasound follow-up.  Lymph nodes: Lack of intravenous contrast administration limits evaluation for cervical lymphadenopathy. Within this limitation, no pathologically enlarged or suspicious cervical chain lymph nodes are identified. Vascular: There is limited evaluation of the major vascular structures of the neck on this noncontrast examination. Atherosclerotic calcifications within the visualized aortic arch, proximal major branch vessels of the neck and carotid arteries. Limited intracranial: No acute intracranial abnormality is identified. Visualized orbits: Visualized orbits show no acute finding. Mastoids and visualized paranasal sinuses: Mild left sphenoid sinus mucosal thickening. No significant mastoid effusion. Skeleton: Advanced cervical spondylosis with multilevel disc space narrowing, posterior disc osteophytes, uncovertebral and facet hypertrophy. There is fusion across the C4-C5 disc space. Also of note, there is advanced C5-C6 disc degeneration with degenerative endplate irregularity and degenerative endplate sclerosis. Prominent multilevel ventral osteophytes, most notably at C5-C6 and C6-C7. Upper chest: Partially visualized right pleural effusion, likely moderate in size. There are multifocal ground-glass opacities within the imaged lung apices measuring up to 8 mm. Other: TEP device noted. IMPRESSION: Prior total laryngectomy. Within the limitations of a non-contrast examination, no definite residual/recurrent soft tissue neck mass is identified. No pathologically enlarged cervical chain lymph nodes are identified. There is prominent debris within the oropharynx and upper esophagus which may reflect a food bolus. There is associated mild dilation of the upper esophagus. Findings raise the possibility of an upper  esophageal stricture with obstruction. An esophagram may be helpful for further evaluation. Prominent ventral osteophytes most notably at the C5-C6 and C6-C7 levels, which may exert some mass effect upon the  upper esophagus. Multifocal ground-glass opacities within the imaged lung apices measuring up to 8 mm. Findings may be infectious/inflammatory in etiology. However, follow-up is recommended to exclude alternative etiologies (i.e. neoplasm). Non-contrast chest CT at 3-6 months is recommended. If nodules persist, subsequent management will be based upon the most suspicious nodule(s). This recommendation follows the consensus statement: Guidelines for Management of Incidental Pulmonary Nodules Detected on CT Images: From the Fleischner Society 2017; Radiology 2017; 284:228-243. Partially imaged right pleural effusion, likely moderate in size. Electronically Signed   By: Kellie Simmering DO   On: 02/13/2020 09:07     EKG: Independently reviewed.  Sinus rhythm, QTC 479, LAD, poor R wave progression  Assessment/Plan Principal Problem:   Difficulty swallowing Active Problems:   Anemia of chronic disease   Coronary artery disease   Hyperlipidemia   Hypertension   Squamous cell cancer of hypopharynx (HCC)   Acute deep vein thrombosis (DVT) of right upper extremity (HCC)   AF (paroxysmal atrial fibrillation) (HCC)   Chronic systolic CHF (congestive heart failure) (HCC)   ESRD (end stage renal disease) (HCC)   Hypoglycemia   COPD (chronic obstructive pulmonary disease) (HCC)   Type II diabetes mellitus with renal manifestations (HCC)   Difficulty swallowing: CT scan showed possible esophageal stricture.,  Dr. Marius Ditch of GI is consulted, patient likely need EGD.  Dr. Marius Ditch recommend to get barium esophagram first.  -will place on med-surg bed for obs -hold Eliquis -Barium esophagram -Cardiology consult for preprocedure clearance given significant cardiac history as recommended by Dr. Marius Ditch. Consulted Dr. Clayborn Bigness.  Anemia of chronic disease: Hgb 13.0 -f/u by CBC -Continue iron supplement  Coronary artery disease: No CP -on lipitor  Hyperlipidemia -Lipitor  Type II diabetes mellitus with renal  manifestations: Recent A1c 5.5, well controlled.  Patient taking Lantus and glipizide.  Patient has hypoglycemia currently. -Hold Lantus and glipizide  Hypertension: pt's bp is soft.  Patient is actually on midodrine at home. -IV hydralazine as needed  Squamous cell cancer of hypopharynx [facility]): s/p of tracheostomy with stoma on trach collar -Follow-up in [facility]  Acute deep vein thrombosis (DVT) of right upper extremity (HCC) -switch Eliquis to IV heparin  AF (paroxysmal atrial fibrillation) ([place]): HR 70-90S -Amiodarone -Hold Eliquis  Chronic systolic CHF (congestive heart failure) ([place]): 2D echo on 11/30/2019 showed EF of 20-30%.  Patient does not have respiratory distress.  No leg edema.  CHF is compensated. -Volume management per renal by dialysis  ESRD (end stage renal disease) (TTS) -Dr. Candiss Norse of renal is consulted for dialysis  Hypoglycemia  -Check CBG every hour -D10 at 50 cc/h -As needed D50 -Hold Lantus and glipizide  COPD (chronic obstructive pulmonary disease) ([place]): - prn albuterol nebs         DVT ppx: IV Heparin     Code Status: Full code Family Communication:  Yes, patient's wife at bed side Disposition Plan:  Anticipate discharge back to previous environment Consults called:  Dr. Marius Ditch of GI and Dr. Candiss Norse of nephrology were consulted.  Admission status: Med-surg bed for obs   Status is: Observation  The patient remains OBS appropriate and will d/c before 2 midnights.  Dispo: The patient is from: Home              Anticipated d/c is to: Home  Anticipated d/c date is: 1 day              Patient currently is not medically stable to d/c.          Date of Service 02/13/2020    Ivor Costa [facility]   If 7PM-7AM, please contact night-coverage [email or web address] 02/13/2020, 1:36 PM

## 2020-02-13 NOTE — Consult Note (Signed)
[name], MD [address]  [place]  [place],  [address]  Main: [phone]  Fax: [phone] Pager: [phone]   Consultation  Referring Provider:     No ref. provider found Primary Care Physician:  Kerry Sheerer, MD Primary Gastroenterologist: Kerry [name]         Reason for Consultation:     Dysphagia  Date of Admission:  02/13/2020 Date of Consultation:  02/13/2020         HPI:   Kerry [name] is a 86 y.o. male with history of laryngeal cancer, s/p laryngectomy, had a tracheostomy currently on a trach collar, ESRD on hemodialysis, history of V. fib arrest in 12/2019 during dialysis, history of CHF EF of 20 to 30%, paroxysmal A. fib on Eliquis, history of right upper extremity DVT presented with nausea, vomiting and difficulty swallowing which has started about 2 days ago and he is not able to tolerate anything by mouth.  This was reported by patient's wife.  Patient has been regurgitating food whenever he attempts to eat.  He is not able to tolerate liquids as well.  He states, his saliva accumulates in his throat, he denies abdominal pain, diarrhea.  He does have mild cough and shortness of breath at baseline which is not changed.  He is on room air.  Patient undergoes dialysis on Tuesday, Thursday and Saturday.  Patient is due for dialysis today.  In the ER, patient underwent CT abdomen and pelvis without contrast with did not reveal any acute intra-abdominal pathology.  His CT soft tissue neck without contrast revealed prominent debris is within the oropharynx and upper esophagus which may reflect a food bolus and associated mild dilation of the upper esophagus.  Concerning for possibility of proximal esophageal stricture.  Patient recovered from V. fib arrest that occurred in 12/2019, he went to rehab.  Currently at home with his wife.  Patient has been recently seen by Kerry [name] as outpatient on 9/3 for follow-up of his post cardiac arrest  NSAIDs:  None  Antiplts/Anticoagulants/Anti thrombotics: Eliquis for history of A. fib and right upper extremity DVT  GI Procedures: None  Past Medical History:  Diagnosis Date  . Cancer ([place])   . CHF (congestive heart failure) ([place])   . COPD (chronic obstructive pulmonary disease) ([place])   . Diabetes mellitus without complication ([place])   . Hypertension   . Renal disorder     Past Surgical History:  Procedure Laterality Date  . BACK SURGERY    . TOTAL LARYNGECTOMY      Prior to Admission medications   Medication Sig Start Date End Date Taking? Authorizing Provider  amiodarone (PACERONE) 100 MG tablet Take 1 tablet (100 mg total) by mouth daily. 12/13/19 02/13/20 Yes Shahmehdi, Kerry Batman, MD  apixaban (ELIQUIS) 5 MG TABS tablet Two tablets twice a day through 12/17/2019 then one tablet twice aday afterwards 12/17/19  Yes Wieting, Richard, MD  B Complex-C-Folic Acid (TRIPHROCAPS) 1 MG CAPS Take 1 capsule by mouth daily. 02/07/20  Yes [provider]  epoetin alfa (EPOGEN) 2000 UNIT/ML injection Inject 1 mL (2,000 Units total) into the vein Every Tuesday,Thursday,and Saturday with dialysis. 12/12/19  Yes Shahmehdi, Kerry Batman, MD  EUTHYROX 50 MCG tablet Take 50 mcg by mouth daily. 01/15/20  Yes [provider]  ferrous sulfate 325 (65 FE) MG EC tablet Take 325 mg by mouth daily with breakfast.   Yes [provider]  glipiZIDE (GLUCOTROL) 5 MG tablet Take 5  mg by mouth every morning. 01/15/20  Yes [provider]  midodrine (PROAMATINE) 10 MG tablet Take 10 mg by mouth 3 (three) times daily. 01/20/20  Yes [provider]  sevelamer carbonate (RENVELA) 800 MG tablet Take 800-1,600 mg by mouth in the morning, at noon, in the evening, and at bedtime. 11/14/19  Yes [provider]  atorvastatin (LIPITOR) 80 MG tablet Take 80 mg by mouth daily.  03/15/17   [provider]  chlorhexidine gluconate, MEDLINE KIT, (PERIDEX) 0.12 % solution 15 mLs by Mouth Rinse  route 2 (two) times daily. 12/12/19   Shahmehdi, Kerry Batman, MD  glucagon (GLUCAGON EMERGENCY) 1 MG injection as needed. Reported on 06/02/2015 01/19/15   [provider]  glucose blood (PRECISION QID TEST) test strip Accu check Aviva strips, use twice daily 01/16/15   [provider]  LANTUS SOLOSTAR 100 UNIT/ML Solostar Pen Inject 2 Units into the skin daily after breakfast.  01/15/20   [provider]  lidocaine-prilocaine (EMLA) cream Apply 1 application topically as needed (topical anesthesia for hemodialysis if Gebauers and Lidocaine injection are ineffective.). 12/12/19   Shahmehdi, Kerry Batman, MD  melatonin 5 MG TABS Take 0.5 tablets (2.5 mg total) by mouth at bedtime. 12/17/19   Kerry Grayer, MD  polyethylene glycol (MIRALAX / GLYCOLAX) 17 g packet Take 17 g by mouth daily as needed for moderate constipation. 12/17/19   Kerry Grayer, MD    Current Facility-Administered Medications:  .  0.9 %  sodium chloride infusion, 250 mL, Intravenous, PRN, Kerry Costa, MD .  acetaminophen (TYLENOL) tablet 650 mg, 650 mg, Oral, Q6H PRN **OR** acetaminophen (TYLENOL) suppository 650 mg, 650 mg, Rectal, Q6H PRN, Kerry Costa, MD .  albuterol (PROVENTIL) (2.5 MG/3ML) 0.083% nebulizer solution 2.5 mg, 2.5 mg, Nebulization, Q4H PRN, Kerry Costa, MD .  amiodarone (PACERONE) tablet 100 mg, 100 mg, Oral, Daily, Kerry Costa, MD, 100 mg at 02/13/20 1047 .  atorvastatin (LIPITOR) tablet 80 mg, 80 mg, Oral, Daily, Kerry Costa, MD .  chlorhexidine gluconate (MEDLINE KIT) (PERIDEX) 0.12 % solution 15 mL, 15 mL, Mouth Rinse, BID, Kerry Costa, MD .  Chlorhexidine Gluconate Cloth 2 % PADS 6 each, 6 each, Topical, Q0600, Singh, Harmeet, MD .  dextrose 10 % infusion, , Intravenous, Continuous, [name], Xilin, MD .  dextrose 50 % solution 50 mL, 50 mL, Intravenous, PRN, Kerry Costa, MD, 50 mL at 02/13/20 1717 .  [START ON 02/14/2020] ferrous sulfate tablet 325 mg, 325 mg, Oral, Q breakfast, Kerry Costa, MD .   heparin ADULT infusion 100 units/mL (25000 units/274m sodium chloride 0.45%), 950 Units/hr, Intravenous, Continuous, Kerry [name], RPH .  hydrALAZINE (APRESOLINE) injection 5 mg, 5 mg, Intravenous, Q2H PRN, NIvor Costa MD .  [Derrill MemoON 02/14/2020] levothyroxine (SYNTHROID) tablet 50 mcg, 50 mcg, Oral, Daily, NIvor Costa MD .  lidocaine-prilocaine (EMLA) cream 1 application, 1 application, Topical, PRN, NIvor Costa MD .  melatonin tablet 2.5 mg, 2.5 mg, Oral, QHS, NIvor Costa MD .  midodrine (PROAMATINE) tablet 10 mg, 10 mg, Oral, TID, NIvor Costa MD .  multivitamin (RENA-VIT) tablet 1 tablet, 1 tablet, Oral, Daily, NIvor Costa MD .  ondansetron ([facility] injection 4 mg, 4 mg, Intravenous, Q8H PRN, NIvor Costa MD .  polyethylene glycol (MIRALAX / GLYCOLAX) packet 17 g, 17 g, Oral, Daily PRN, NIvor Costa MD .  sevelamer carbonate (RENVELA) tablet 1,600 mg, 1,600 mg, Oral, TID WC, CBenn Moulder RPH .  sevelamer carbonate (RENVELA) tablet 800 mg, 800 mg, Oral,  Daily PRN, Benn Moulder, RPH .  sodium chloride flush (NS) 0.9 % injection 3 mL, 3 mL, Intravenous, Q12H, Kerry Costa, MD .  sodium chloride flush (NS) 0.9 % injection 3 mL, 3 mL, Intravenous, PRN, Kerry Costa, MD  Current Outpatient Medications:  .  amiodarone (PACERONE) 100 MG tablet, Take 1 tablet (100 mg total) by mouth daily., Disp: 30 tablet, Rfl: 0 .  apixaban (ELIQUIS) 5 MG TABS tablet, Two tablets twice a day through 12/17/2019 then one tablet twice aday afterwards, Disp: 61 tablet, Rfl: 0 .  B Complex-C-Folic Acid (TRIPHROCAPS) 1 MG CAPS, Take 1 capsule by mouth daily., Disp: , Rfl:  .  epoetin alfa (EPOGEN) 2000 UNIT/ML injection, Inject 1 mL (2,000 Units total) into the vein Every Tuesday,Thursday,and Saturday with dialysis., Disp: 1 mL, Rfl:  .  EUTHYROX 50 MCG tablet, Take 50 mcg by mouth daily., Disp: , Rfl:  .  ferrous sulfate 325 (65 FE) MG EC tablet, Take 325 mg by mouth daily with breakfast., Disp: , Rfl:    .  glipiZIDE (GLUCOTROL) 5 MG tablet, Take 5 mg by mouth every morning., Disp: , Rfl:  .  midodrine (PROAMATINE) 10 MG tablet, Take 10 mg by mouth 3 (three) times daily., Disp: , Rfl:  .  sevelamer carbonate (RENVELA) 800 MG tablet, Take 800-1,600 mg by mouth in the morning, at noon, in the evening, and at bedtime., Disp: , Rfl:  .  atorvastatin (LIPITOR) 80 MG tablet, Take 80 mg by mouth daily. , Disp: , Rfl:  .  chlorhexidine gluconate, MEDLINE KIT, (PERIDEX) 0.12 % solution, 15 mLs by Mouth Rinse route 2 (two) times daily., Disp: 120 mL, Rfl: 0 .  glucagon (GLUCAGON EMERGENCY) 1 MG injection, as needed. Reported on 06/02/2015, Disp: , Rfl:  .  glucose blood (PRECISION QID TEST) test strip, Accu check Aviva strips, use twice daily, Disp: , Rfl:  .  LANTUS SOLOSTAR 100 UNIT/ML Solostar Pen, Inject 2 Units into the skin daily after breakfast. , Disp: , Rfl:  .  lidocaine-prilocaine (EMLA) cream, Apply 1 application topically as needed (topical anesthesia for hemodialysis if Gebauers and Lidocaine injection are ineffective.)., Disp: 30 g, Rfl: 0 .  melatonin 5 MG TABS, Take 0.5 tablets (2.5 mg total) by mouth at bedtime., Disp: 30 tablet, Rfl: 0 .  polyethylene glycol (MIRALAX / GLYCOLAX) 17 g packet, Take 17 g by mouth daily as needed for moderate constipation., Disp: 14 each, Rfl: 0  Family History  Problem Relation Age of Onset  . CAD Sister      Social History   Tobacco Use  . Smoking status: Former Smoker    Packs/day: 0.50    Years: 20.00    Pack years: 10.00    Types: Cigarettes    Quit date: 12/05/2014    Years since quitting: 5.1  . Smokeless tobacco: Never Used  . Tobacco comment: Quit 12/2014  Vaping Use  . Vaping Use: Never used  Substance Use Topics  . Alcohol use: No  . Drug use: No    Allergies as of 02/12/2020 - Review Complete 02/12/2020  Allergen Reaction Noted  . Ace inhibitors Other (See Comments) 02/02/2017  . Gabapentin Other (See Comments) 11/11/2016  .  Losartan Other (See Comments) 02/02/2017    Review of Systems:    All systems reviewed and negative except where noted in HPI.   Physical Exam:  Vital signs in last 24 hours: Temp:  [97.8 F (36.6 C)-98 F (36.7 C)] 98 F (  36.7 C) (09/08 2356) Pulse Rate:  [70-94] 76 (09/09 0527) Resp:  [16-29] 29 (09/09 1049) BP: (120-136)/(65-91) 136/85 (09/09 1049) SpO2:  [97 %-98 %] 98 % (09/09 0527) Weight:  [57.7 kg] 57.7 kg (09/09 1703)   General:   Pleasant, cooperative in NAD, thin built Head:  Normocephalic and atraumatic. Eyes:   No icterus.   Conjunctiva pink. PERRLA. Ears:  Normal auditory acuity. Neck:  Supple; no masses or thyroidomegaly, tracheostomy collar in place Lungs: Respirations even and unlabored. Lungs clear to auscultation bilaterally.   No wheezes, crackles, or rhonchi.  Heart:  Regular rate and rhythm;  Without murmur, clicks, rubs or gallops Abdomen:  Soft, nondistended, nontender. Normal bowel sounds. No appreciable masses or hepatomegaly.  No rebound or guarding.  Rectal:  Not performed. Msk:  Symmetrical without gross deformities.  Strength generalized weakness Extremities:  Without edema, cyanosis or clubbing. Neurologic:  Alert and oriented x3;  grossly normal neurologically. Skin:  Intact without significant lesions or rashes. Psych:  Alert and cooperative. Normal affect.  LAB RESULTS: CBC Latest Ref Rng & Units 02/12/2020 12/17/2019 12/14/2019  WBC 4.0 - 10.5 K/uL 5.8 7.0 9.1  Hemoglobin 13.0 - 17.0 g/dL 13.0 9.4(L) 10.0(L)  Hematocrit 39 - 52 % 37.4(L) 25.7(L) 27.6(L)  Platelets 150 - 400 K/uL 153 229 236    BMET BMP Latest Ref Rng & Units 02/13/2020 02/12/2020 12/17/2019  Glucose 70 - 99 mg/dL 88 55(L) 192(H)  BUN 8 - 23 mg/dL 54(H) 45(H) 76(H)  Creatinine 0.61 - 1.24 mg/dL 7.00(H) 5.98(H) 7.65(H)  Sodium 135 - 145 mmol/L 141 142 139  Potassium 3.5 - 5.1 mmol/L 4.5 4.3 4.4  Chloride 98 - 111 mmol/L 98 99 98  CO2 22 - 32 mmol/L 25 26 28   Calcium 8.9 - 10.3  mg/dL 9.1 9.0 8.6(L)    LFT Hepatic Function Latest Ref Rng & Units 02/12/2020 12/02/2019 12/01/2019  Total Protein 6.5 - 8.1 g/dL 7.6 - -  Albumin 3.5 - 5.0 g/dL 4.3 2.7(L) 2.6(L)  AST 15 - 41 U/L 19 - -  ALT 0 - 44 U/L 21 - -  Alk Phosphatase 38 - 126 U/L 54 - -  Total Bilirubin 0.3 - 1.2 mg/dL 1.4(H) - -     STUDIES: CT ABDOMEN PELVIS WO CONTRAST  Result Date: 02/13/2020 CLINICAL DATA:  Vomiting, hypoglycemia, abdominal pain, question bowel obstruction, dialysis patient EXAM: CT ABDOMEN AND PELVIS WITHOUT CONTRAST TECHNIQUE: Multidetector CT imaging of the abdomen and pelvis was performed following the standard protocol without IV contrast. Sagittal and coronal MPR images reconstructed from axial data set. Oral contrast was not administered. COMPARISON:  12/06/2019 FINDINGS: Lower chest: Small to moderate RIGHT pleural effusion, slightly increased. Resolution of previously seen LEFT pleural effusion. Compressive atelectasis RIGHT lower lobe. Enlargement of cardiac chambers. Hepatobiliary: High attenuation material within gallbladder increased from previous exam, could represent layered calculi, milk of calcium or vicarious excretion of contrast if recently administered. No definite focal hepatic abnormality. Pancreas: Normal appearance Spleen: Normal appearance Adrenals/Urinary Tract: Thickening of adrenal glands without discrete mass. Atrophic kidneys consistent with history of dialysis. Questionable tiny hyperdense cyst 4 mm diameter at mid RIGHT kidney. No additional renal mass or hydronephrosis. Ureters and bladder decompressed. Stomach/Bowel: Mild rectal wall thickening. Appendix not visualized. Gaseous distention of sigmoid loop extending cranially to the LEFT lobe of the liver, 6.8 cm transverse. Remainder of colon decompressed. Stomach decompressed. No bowel dilatation or evidence of obstruction. Mild sigmoid diverticulosis incidentally noted without evidence of diverticulitis.  Vascular/Lymphatic:  Extensive atherosclerotic calcifications aorta, iliac arteries, and coronary arteries. Significant calcified plaque at the origins of the celiac, superior mesenteric, and BILATERAL renal arteries. Aorta normal caliber. No adenopathy. Reproductive: Mild prostatic enlargement. Other: Small amount of free fluid in pelvis and LEFT mid abdomen. No free air. No hernia. Musculoskeletal: Osseous demineralization. Multilevel degenerative disc disease changes thoracolumbar spine. Chronic superior endplate concavity L1 unchanged. Degenerative changes of both hip joints. IMPRESSION: Gaseous distention of sigmoid loop extending cranially to the LEFT lobe of the liver, 6.8 cm transverse without wall thickening or obstruction. Mild rectal wall thickening question proctitis; recommend correlation with proctoscopy. Small amount of nonspecific free fluid in pelvis and LEFT mid abdomen. Small to moderate RIGHT pleural effusion, slightly increased. High attenuation material within gallbladder increased from previous exam, could represent layered calculi, milk of calcium or vicarious excretion of contrast if recently administered. Mild prostatic enlargement. Aortic Atherosclerosis (ICD10-I70.0). Electronically Signed   By: Lavonia Dana [name].D.   On: 02/13/2020 08:49   DG Chest 2 View  Result Date: 02/13/2020 CLINICAL DATA:  Vomiting.  Difficulty swallowing. EXAM: CHEST - 2 VIEW COMPARISON:  CT 12/06/2019.  Chest x-ray 12/02/2019. FINDINGS: Interim removal of tracheostomy tube. Stable cardiomegaly. Bibasilar atelectasis/infiltrates again noted. Small right pleural effusion again noted. No left pleural effusion noted on today's exam. No pneumothorax. Surgical clips right neck. IMPRESSION: 1.  Interim removal of tracheostomy tube. 2.  Stable cardiomegaly. 3. Bibasilar atelectasis/infiltrates again noted. Small right pleural effusion again noted. No left pleural effusion noted on today's exam. Electronically Signed   By:  Marcello Moores  Register   On: 02/13/2020 08:19   CT Head Wo Contrast  Result Date: 02/13/2020 CLINICAL DATA:  Dysphagia EXAM: CT HEAD WITHOUT CONTRAST TECHNIQUE: Contiguous axial images were obtained from the base of the skull through the vertex without intravenous contrast. COMPARISON:  November 28, 2019 FINDINGS: Brain: There is stable mild diffuse atrophy. There is no intracranial mass, hemorrhage, extra-axial fluid collection, or midline shift. Slight small vessel disease in the centra semiovale bilaterally is stable. No acute infarct is demonstrable on this study. Vascular: No hyperdense vessel. There are foci of calcification in the carotid siphon regions bilaterally. Skull: The bony calvarium appears intact. Sinuses/Orbits: Visualized paranasal sinuses are clear. Visualized orbits appear symmetric bilaterally. Other: Mastoid air cells are clear. IMPRESSION: Atrophy with slight periventricular small vessel disease. No acute infarct. No mass or hemorrhage. There are foci of arterial vascular calcification. Electronically Signed   By: Lowella Grip III [name].D.   On: 02/13/2020 08:46   CT Soft Tissue Neck Wo Contrast  Result Date: 02/13/2020 CLINICAL DATA:  Lymphadenopathy, neck. History of throat cancer, difficulty swallowing. Abdominal and throat pain. EXAM: CT NECK WITHOUT CONTRAST TECHNIQUE: Multidetector CT imaging of the neck was performed following the standard protocol without intravenous contrast. COMPARISON:  No pertinent prior exams are available for comparison. FINDINGS: Pharynx and larynx: Prior total laryngectomy. Please note there is limited assessment for residual/recurrent malignancy on this non-contrast examination. Within this limitation, no definite discrete mass is identified within the oral cavity or pharynx. There is prominent debris within the oropharynx and upper esophagus which may reflect a food bolus. There is associated mild dilation of the upper esophagus. The esophagus becomes  decompressed at the C7 level. Salivary glands: There are a few nonspecific calcifications within the parotid glands bilaterally. The submandibular glands are unremarkable. Thyroid: The right thyroid lobe is poorly delineated and may be surgically absent. There are subcentimeter nodules within the left thyroid lobe not  meeting consensus criteria for ultrasound follow-up. Lymph nodes: Lack of intravenous contrast administration limits evaluation for cervical lymphadenopathy. Within this limitation, no pathologically enlarged or suspicious cervical chain lymph nodes are identified. Vascular: There is limited evaluation of the major vascular structures of the neck on this noncontrast examination. Atherosclerotic calcifications within the visualized aortic arch, proximal major branch vessels of the neck and carotid arteries. Limited intracranial: No acute intracranial abnormality is identified. Visualized orbits: Visualized orbits show no acute finding. Mastoids and visualized paranasal sinuses: Mild left sphenoid sinus mucosal thickening. No significant mastoid effusion. Skeleton: Advanced cervical spondylosis with multilevel disc space narrowing, posterior disc osteophytes, uncovertebral and facet hypertrophy. There is fusion across the C4-C5 disc space. Also of note, there is advanced C5-C6 disc degeneration with degenerative endplate irregularity and degenerative endplate sclerosis. Prominent multilevel ventral osteophytes, most notably at C5-C6 and C6-C7. Upper chest: Partially visualized right pleural effusion, likely moderate in size. There are multifocal ground-glass opacities within the imaged lung apices measuring up to 8 mm. Other: TEP device noted. IMPRESSION: Prior total laryngectomy. Within the limitations of a non-contrast examination, no definite residual/recurrent soft tissue neck mass is identified. No pathologically enlarged cervical chain lymph nodes are identified. There is prominent debris within  the oropharynx and upper esophagus which may reflect a food bolus. There is associated mild dilation of the upper esophagus. Findings raise the possibility of an upper esophageal stricture with obstruction. An esophagram may be helpful for further evaluation. Prominent ventral osteophytes most notably at the C5-C6 and C6-C7 levels, which may exert some mass effect upon the upper esophagus. Multifocal ground-glass opacities within the imaged lung apices measuring up to 8 mm. Findings may be infectious/inflammatory in etiology. However, follow-up is recommended to exclude alternative etiologies (i.e. neoplasm). Non-contrast chest CT at 3-6 months is recommended. If nodules persist, subsequent management will be based upon the most suspicious nodule(s). This recommendation follows the consensus statement: Guidelines for Management of Incidental Pulmonary Nodules Detected on CT Images: From the Fleischner Society 2017; Radiology 2017; 284:228-243. Partially imaged right pleural effusion, likely moderate in size. Electronically Signed   By: Kellie Simmering DO   On: 02/13/2020 09:07      Impression / Plan:   Kerry [name] is a 87 y.o. male with history of laryngeal cancer status post laryngectomy, status post tracheostomy, currently on trach collar, CHF EF of 20 to 30%, paroxysmal A. fib, V. fib arrest in 12/2019, right upper extremity DVT on Eliquis, ESRD on hemodialysis presented with acute onset of dysphagia for 2 days  Esophageal dysphagia: CT soft tissue neck is concerning for proximal esophageal stricture Patient is on Eliquis prior to admission, which is currently held.  This needs to be off at least for 3 days before proceeding with EGD in anticipation of dilation of esophageal stricture Recommend barium esophagogram given his extensive cardiac history before proceeding with EGD Recommend cardiac clearance for endoscopic evaluation Maintain n.p.o. except for medication  Thank you for involving me in the  care of this patient.  GI will follow along with you    LOS: 0 days   Sherri Sear, MD  02/13/2020, 5:47 PM   Note: This dictation was prepared with Dragon dictation along with smaller phrase technology. Any transcriptional errors that result from this process are unintentional.

## 2020-02-14 ENCOUNTER — Inpatient Hospital Stay: Payer: Medicare Other

## 2020-02-14 ENCOUNTER — Other Ambulatory Visit: Payer: Self-pay

## 2020-02-14 LAB — GLUCOSE, CAPILLARY
Glucose-Capillary: 102 mg/dL — ABNORMAL HIGH (ref 70–99)
Glucose-Capillary: 102 mg/dL — ABNORMAL HIGH (ref 70–99)
Glucose-Capillary: 102 mg/dL — ABNORMAL HIGH (ref 70–99)
Glucose-Capillary: 105 mg/dL — ABNORMAL HIGH (ref 70–99)
Glucose-Capillary: 106 mg/dL — ABNORMAL HIGH (ref 70–99)
Glucose-Capillary: 106 mg/dL — ABNORMAL HIGH (ref 70–99)
Glucose-Capillary: 108 mg/dL — ABNORMAL HIGH (ref 70–99)
Glucose-Capillary: 115 mg/dL — ABNORMAL HIGH (ref 70–99)
Glucose-Capillary: 115 mg/dL — ABNORMAL HIGH (ref 70–99)
Glucose-Capillary: 116 mg/dL — ABNORMAL HIGH (ref 70–99)
Glucose-Capillary: 123 mg/dL — ABNORMAL HIGH (ref 70–99)
Glucose-Capillary: 90 mg/dL (ref 70–99)

## 2020-02-14 LAB — CBC
HCT: 36 % — ABNORMAL LOW (ref 39.0–52.0)
Hemoglobin: 13.2 g/dL (ref 13.0–17.0)
MCH: 33.2 pg (ref 26.0–34.0)
MCHC: 36.7 g/dL — ABNORMAL HIGH (ref 30.0–36.0)
MCV: 90.7 fL (ref 80.0–100.0)
Platelets: 123 10*3/uL — ABNORMAL LOW (ref 150–400)
RBC: 3.97 MIL/uL — ABNORMAL LOW (ref 4.22–5.81)
RDW: 16.9 % — ABNORMAL HIGH (ref 11.5–15.5)
WBC: 4.6 10*3/uL (ref 4.0–10.5)
nRBC: 0 % (ref 0.0–0.2)

## 2020-02-14 LAB — SURGICAL PCR SCREEN
MRSA, PCR: NEGATIVE
Staphylococcus aureus: NEGATIVE

## 2020-02-14 LAB — BASIC METABOLIC PANEL
Anion gap: 13 (ref 5–15)
BUN: 25 mg/dL — ABNORMAL HIGH (ref 8–23)
CO2: 29 mmol/L (ref 22–32)
Calcium: 8.6 mg/dL — ABNORMAL LOW (ref 8.9–10.3)
Chloride: 97 mmol/L — ABNORMAL LOW (ref 98–111)
Creatinine, Ser: 4.44 mg/dL — ABNORMAL HIGH (ref 0.61–1.24)
GFR calc Af Amer: 13 mL/min — ABNORMAL LOW (ref >60)
GFR calc non Af Amer: 11 mL/min — ABNORMAL LOW (ref >60)
Glucose, Bld: 119 mg/dL — ABNORMAL HIGH (ref 70–99)
Potassium: 3.7 mmol/L (ref 3.5–5.1)
Sodium: 139 mmol/L (ref 135–145)

## 2020-02-14 LAB — APTT
aPTT: >160 seconds (ref 24–36)
aPTT: 117 seconds — ABNORMAL HIGH (ref 24–36)
aPTT: 105 seconds — ABNORMAL HIGH (ref 24–36)
aPTT: 128 seconds — ABNORMAL HIGH (ref 24–36)

## 2020-02-14 LAB — PHOSPHORUS: Phosphorus: 4.2 mg/dL (ref 2.5–4.6)

## 2020-02-14 MED ORDER — HEPARIN SODIUM (PORCINE) 1000 UNIT/ML DIALYSIS
20.0000 [IU]/kg | INTRAMUSCULAR | Status: DC | PRN
  Filled 2020-02-14: qty 2

## 2020-02-14 MED ORDER — CHLORHEXIDINE GLUCONATE 0.12 % MT SOLN
15.0000 mL | Freq: Two times a day (BID) | OROMUCOSAL | Status: DC
  Administered 2020-02-15 – 2020-02-20 (×9): 15 mL via OROMUCOSAL
  Filled 2020-02-14 (×8): qty 15

## 2020-02-14 MED ORDER — ORAL CARE MOUTH RINSE
15.0000 mL | Freq: Two times a day (BID) | OROMUCOSAL | Status: DC
  Administered 2020-02-16 – 2020-02-20 (×6): 15 mL via OROMUCOSAL

## 2020-02-14 MED ORDER — LEVOTHYROXINE SODIUM 100 MCG/5ML IV SOLN
25.0000 ug | Freq: Every day | INTRAVENOUS | Status: DC
  Administered 2020-02-17 – 2020-02-20 (×3): 25 ug via INTRAVENOUS
  Filled 2020-02-14 (×6): qty 5

## 2020-02-14 MED ORDER — POTASSIUM CHLORIDE 10 MEQ/100ML IV SOLN
10.0000 meq | INTRAVENOUS | Status: AC
  Administered 2020-02-14 (×2): 10 meq via INTRAVENOUS
  Filled 2020-02-14 (×4): qty 100

## 2020-02-14 MED ORDER — MUPIROCIN 2 % EX OINT: 1.0000 "application " | TOPICAL_OINTMENT | Freq: Two times a day (BID) | CUTANEOUS | Status: DC

## 2020-02-14 NOTE — Consult Note (Signed)
Reason for Consult: Pain left great toe. Referring Physician: Kypton Eltringham is an 86 y.o. male.  HPI: This is an 86 year old diabetic male with recent development of some pain and swelling and redness in his left great toe.  Denies any injury.  Past Medical History:  Diagnosis Date  . Cancer ([place])   . CHF (congestive heart failure) ([place])   . COPD (chronic obstructive pulmonary disease) ([place])   . Diabetes mellitus without complication ([place])   . Hypertension   . Renal disorder     Past Surgical History:  Procedure Laterality Date  . BACK SURGERY    . TOTAL LARYNGECTOMY      Family History  Problem Relation Age of Onset  . CAD Sister     Social History:  reports that he quit smoking about 5 years ago. His smoking use included cigarettes. He has a 10.00 pack-year smoking history. He has never used smokeless tobacco. He reports that he does not drink alcohol and does not use drugs.  Allergies:  Allergies  Allergen Reactions  . Ace Inhibitors Other (See Comments)    Potassium elevated  . Gabapentin Other (See Comments)    Ataxia at 100 mg.  . Losartan Other (See Comments)    Hyperkalemia    Medications:  Scheduled: . amiodarone  100 mg Oral Daily  . chlorhexidine  15 mL Mouth Rinse BID  . chlorhexidine gluconate (MEDLINE KIT)  15 mL Mouth Rinse BID  . [START ON 02/17/2020] levothyroxine  25 mcg Intravenous Q0600  . mouth rinse  15 mL Mouth Rinse q12n4p  . sodium chloride flush  3 mL Intravenous Q12H    Results for orders placed or performed during the hospital encounter of 02/13/20 (from the past 48 hour(s))  Glucose, capillary     Status: Abnormal   Collection Time: 02/13/20  2:47 AM  Result Value Ref Range   Glucose-Capillary 104 (H) 70 - 99 mg/dL    Comment: Glucose reference range applies only to samples taken after fasting for at least 8 hours.   Comment 1 Notify RN   Glucose, capillary     Status: None   Collection Time: 02/13/20  5:50 AM  Result  Value Ref Range   Glucose-Capillary 71 70 - 99 mg/dL    Comment: Glucose reference range applies only to samples taken after fasting for at least 8 hours.  Glucose, capillary     Status: None   Collection Time: 02/13/20  7:33 AM  Result Value Ref Range   Glucose-Capillary 72 70 - 99 mg/dL    Comment: Glucose reference range applies only to samples taken after fasting for at least 8 hours.  SARS Coronavirus 2 by RT PCR (hospital order, performed in [facility] hospital lab) Nasopharyngeal Nasopharyngeal Swab     Status: None   Collection Time: 02/13/20  7:53 AM   Specimen: Nasopharyngeal Swab  Result Value Ref Range   SARS Coronavirus 2 NEGATIVE NEGATIVE    Comment: (NOTE) SARS-CoV-2 target nucleic acids are NOT DETECTED.  The SARS-CoV-2 RNA is generally detectable in upper and lower respiratory specimens during the acute phase of infection. The lowest concentration of SARS-CoV-2 viral copies this assay can detect is 250 copies / mL. A negative result does not preclude SARS-CoV-2 infection and should not be used as the sole basis for treatment or other patient management decisions.  A negative result may occur with improper specimen collection / handling, submission of specimen other than nasopharyngeal swab, presence of viral  mutation(s) within the areas targeted by this assay, and inadequate number of viral copies (<250 copies / mL). A negative result must be combined with clinical observations, patient history, and epidemiological information.  Fact Sheet for Patients:   StrictlyIdeas.no  Fact Sheet for Healthcare Providers: BankingDealers.co.za  This test is not yet approved or  cleared by the [place] FDA and has been authorized for detection and/or diagnosis of SARS-CoV-2 by FDA under an Emergency Use Authorization (EUA).  This EUA will remain in effect (meaning this test can be used) for the duration of the COVID-19  declaration under Section 564(b)(1) of the Act, 21 U.S.C. section 360bbb-3(b)(1), unless the authorization is terminated or revoked sooner.  Performed at [facility], [address] [place], [place] [address]   Glucose, capillary     Status: None   Collection Time: 02/13/20 10:04 AM  Result Value Ref Range   Glucose-Capillary 82 70 - 99 mg/dL    Comment: Glucose reference range applies only to samples taken after fasting for at least 8 hours.  Basic metabolic panel     Status: Abnormal   Collection Time: 02/13/20 10:07 AM  Result Value Ref Range   Sodium 141 135 - 145 mmol/L   Potassium 4.5 3.5 - 5.1 mmol/L   Chloride 98 98 - 111 mmol/L   CO2 25 22 - 32 mmol/L   Glucose, Bld 88 70 - 99 mg/dL    Comment: Glucose reference range applies only to samples taken after fasting for at least 8 hours.   BUN 54 (H) 8 - 23 mg/dL   Creatinine, Ser 7.00 (H) 0.61 - 1.24 mg/dL   Calcium 9.1 8.9 - 10.3 mg/dL   GFR calc non Af Amer 7 (L) >60 mL/min   GFR calc Af Amer 8 (L) >60 mL/min   Anion gap 18 (H) 5 - 15    Comment: Performed at [facility], [address] [place], [place] [address]  APTT     Status: Abnormal   Collection Time: 02/13/20 10:09 AM  Result Value Ref Range   aPTT 51 (H) 24 - 36 seconds    Comment:        IF BASELINE aPTT IS ELEVATED, SUGGEST PATIENT RISK ASSESSMENT BE USED TO DETERMINE APPROPRIATE ANTICOAGULANT THERAPY. Performed at [facility], [address] [place], [place] [address]   Protime-INR     Status: Abnormal   Collection Time: 02/13/20 10:09 AM  Result Value Ref Range   Prothrombin Time 20.4 (H) 11.4 - 15.2 seconds   INR 1.8 (H) 0.8 - 1.2    Comment: (NOTE) INR goal varies based on device and disease states. Performed at [facility], [place], [place] [address]   Heparin level (unfractionated)     Status: Abnormal   Collection Time: 02/13/20 10:09 AM  Result Value Ref Range   Heparin  Unfractionated >3.60 (H) 0.30 - 0.70 IU/mL    Comment: RESULTS CONFIRMED BY MANUAL DILUTION (NOTE) If heparin results are below expected values, and patient dosage has  been confirmed, suggest follow up testing of antithrombin III levels. Performed at [facility], [address] [place],  [address]   Glucose, capillary     Status: Abnormal   Collection Time: 02/13/20  5:07 PM  Result Value Ref Range   Glucose-Capillary 39 (LL) 70 - 99 mg/dL    Comment: Glucose reference range applies only to samples taken after fasting for at least 8 hours.  Comment 1 Notify RN   Glucose, capillary     Status: Abnormal   Collection Time: 02/13/20  5:26 PM  Result Value Ref Range   Glucose-Capillary 114 (H) 70 - 99 mg/dL    Comment: Glucose reference range applies only to samples taken after fasting for at least 8 hours.  Glucose, capillary     Status: Abnormal   Collection Time: 02/13/20  6:17 PM  Result Value Ref Range   Glucose-Capillary 126 (H) 70 - 99 mg/dL    Comment: Glucose reference range applies only to samples taken after fasting for at least 8 hours.  Glucose, capillary     Status: Abnormal   Collection Time: 02/13/20  9:08 PM  Result Value Ref Range   Glucose-Capillary 148 (H) 70 - 99 mg/dL    Comment: Glucose reference range applies only to samples taken after fasting for at least 8 hours.  Glucose, capillary     Status: Abnormal   Collection Time: 02/13/20 10:18 PM  Result Value Ref Range   Glucose-Capillary 166 (H) 70 - 99 mg/dL    Comment: Glucose reference range applies only to samples taken after fasting for at least 8 hours.  Glucose, capillary     Status: Abnormal   Collection Time: 02/13/20 11:38 PM  Result Value Ref Range   Glucose-Capillary 142 (H) 70 - 99 mg/dL    Comment: Glucose reference range applies only to samples taken after fasting for at least 8 hours.  Glucose, capillary     Status: Abnormal   Collection Time: 02/14/20 12:39 AM  Result  Value Ref Range   Glucose-Capillary 115 (H) 70 - 99 mg/dL    Comment: Glucose reference range applies only to samples taken after fasting for at least 8 hours.  Basic metabolic panel     Status: Abnormal   Collection Time: 02/14/20  1:03 AM  Result Value Ref Range   Sodium 139 135 - 145 mmol/L   Potassium 3.7 3.5 - 5.1 mmol/L   Chloride 97 (L) 98 - 111 mmol/L   CO2 29 22 - 32 mmol/L   Glucose, Bld 119 (H) 70 - 99 mg/dL    Comment: Glucose reference range applies only to samples taken after fasting for at least 8 hours.   BUN 25 (H) 8 - 23 mg/dL   Creatinine, Ser 4.44 (H) 0.61 - 1.24 mg/dL   Calcium 8.6 (L) 8.9 - 10.3 mg/dL   GFR calc non Af Amer 11 (L) >60 mL/min   GFR calc Af Amer 13 (L) >60 mL/min   Anion gap 13 5 - 15    Comment: Performed at [facility], [address] [place], [place] [address]  CBC     Status: Abnormal   Collection Time: 02/14/20  1:03 AM  Result Value Ref Range   WBC 4.6 4.0 - 10.5 K/uL   RBC 3.97 (L) 4.22 - 5.81 MIL/uL   Hemoglobin 13.2 13.0 - 17.0 g/dL   HCT 36.0 (L) 39 - 52 %   MCV 90.7 80.0 - 100.0 fL   MCH 33.2 26.0 - 34.0 pg   MCHC 36.7 (H) 30.0 - 36.0 g/dL   RDW 16.9 (H) 11.5 - 15.5 %   Platelets 123 (L) 150 - 400 K/uL   nRBC 0.0 0.0 - 0.2 %    Comment: Performed at [facility], [address], [place], [place] [address]  APTT     Status: Abnormal   Collection Time: 02/14/20  1:03 AM  Result Value Ref Range   aPTT >160 (HH) 24 - 36 seconds    Comment:        IF BASELINE aPTT IS ELEVATED, SUGGEST PATIENT RISK ASSESSMENT BE USED TO DETERMINE APPROPRIATE ANTICOAGULANT THERAPY. CRITICAL RESULT CALLED TO, READ BACK BY AND VERIFIED WITH: Melody Comas RN [id number] 02/14/20 HNM Performed at [facility], [address], [place], [place] [address]   Surgical PCR screen     Status: None   Collection Time: 02/14/20  1:21 AM   Specimen: Nasal Mucosa; Nasal Swab  Result Value Ref Range   MRSA, PCR NEGATIVE NEGATIVE    Staphylococcus aureus NEGATIVE NEGATIVE    Comment: (NOTE) The Xpert SA Assay (FDA approved for NASAL specimens in patients 32 years of age and older), is one component of a comprehensive surveillance program. It is not intended to diagnose infection nor to guide or monitor treatment. Performed at [facility], [address] [place], [place] [address]   Glucose, capillary     Status: Abnormal   Collection Time: 02/14/20  1:47 AM  Result Value Ref Range   Glucose-Capillary 106 (H) 70 - 99 mg/dL    Comment: Glucose reference range applies only to samples taken after fasting for at least 8 hours.  APTT     Status: Abnormal   Collection Time: 02/14/20  3:01 AM  Result Value Ref Range   aPTT 117 (H) 24 - 36 seconds    Comment:        IF BASELINE aPTT IS ELEVATED, SUGGEST PATIENT RISK ASSESSMENT BE USED TO DETERMINE APPROPRIATE ANTICOAGULANT THERAPY. Performed at [facility], [address] [place], [place] [address]   Glucose, capillary     Status: Abnormal   Collection Time: 02/14/20  4:04 AM  Result Value Ref Range   Glucose-Capillary 106 (H) 70 - 99 mg/dL    Comment: Glucose reference range applies only to samples taken after fasting for at least 8 hours.  Glucose, capillary     Status: Abnormal   Collection Time: 02/14/20  6:47 AM  Result Value Ref Range   Glucose-Capillary 105 (H) 70 - 99 mg/dL    Comment: Glucose reference range applies only to samples taken after fasting for at least 8 hours.  Glucose, capillary     Status: Abnormal   Collection Time: 02/14/20  8:09 AM  Result Value Ref Range   Glucose-Capillary 115 (H) 70 - 99 mg/dL    Comment: Glucose reference range applies only to samples taken after fasting for at least 8 hours.  Glucose, capillary     Status: Abnormal   Collection Time: 02/14/20 10:03 AM  Result Value Ref Range   Glucose-Capillary 116 (H) 70 - 99 mg/dL    Comment: Glucose reference range applies only to samples taken after  fasting for at least 8 hours.  Glucose, capillary     Status: Abnormal   Collection Time: 02/14/20 11:52 AM  Result Value Ref Range   Glucose-Capillary 123 (H) 70 - 99 mg/dL    Comment: Glucose reference range applies only to samples taken after fasting for at least 8 hours.  APTT     Status: Abnormal   Collection Time: 02/14/20  1:12 PM  Result Value Ref Range   aPTT 105 (H) 24 - 36 seconds    Comment:        IF BASELINE aPTT IS ELEVATED, SUGGEST PATIENT RISK ASSESSMENT BE USED TO DETERMINE APPROPRIATE ANTICOAGULANT THERAPY. Performed at [facility],  [place], [place] [address]   Glucose, capillary     Status: Abnormal   Collection Time: 02/14/20  2:37 PM  Result Value Ref Range   Glucose-Capillary 108 (H) 70 - 99 mg/dL    Comment: Glucose reference range applies only to samples taken after fasting for at least 8 hours.  Glucose, capillary     Status: Abnormal   Collection Time: 02/14/20  4:18 PM  Result Value Ref Range   Glucose-Capillary 102 (H) 70 - 99 mg/dL    Comment: Glucose reference range applies only to samples taken after fasting for at least 8 hours.  Glucose, capillary     Status: Abnormal   Collection Time: 02/14/20  6:05 PM  Result Value Ref Range   Glucose-Capillary 102 (H) 70 - 99 mg/dL    Comment: Glucose reference range applies only to samples taken after fasting for at least 8 hours.  Glucose, capillary     Status: None   Collection Time: 02/14/20  8:30 PM  Result Value Ref Range   Glucose-Capillary 90 70 - 99 mg/dL    Comment: Glucose reference range applies only to samples taken after fasting for at least 8 hours.    CT ABDOMEN PELVIS WO CONTRAST  Result Date: 02/13/2020 CLINICAL DATA:  Vomiting, hypoglycemia, abdominal pain, question bowel obstruction, dialysis patient EXAM: CT ABDOMEN AND PELVIS WITHOUT CONTRAST TECHNIQUE: Multidetector CT imaging of the abdomen and pelvis was performed following the standard protocol without  IV contrast. Sagittal and coronal MPR images reconstructed from axial data set. Oral contrast was not administered. COMPARISON:  12/06/2019 FINDINGS: Lower chest: Small to moderate RIGHT pleural effusion, slightly increased. Resolution of previously seen LEFT pleural effusion. Compressive atelectasis RIGHT lower lobe. Enlargement of cardiac chambers. Hepatobiliary: High attenuation material within gallbladder increased from previous exam, could represent layered calculi, milk of calcium or vicarious excretion of contrast if recently administered. No definite focal hepatic abnormality. Pancreas: Normal appearance Spleen: Normal appearance Adrenals/Urinary Tract: Thickening of adrenal glands without discrete mass. Atrophic kidneys consistent with history of dialysis. Questionable tiny hyperdense cyst 4 mm diameter at mid RIGHT kidney. No additional renal mass or hydronephrosis. Ureters and bladder decompressed. Stomach/Bowel: Mild rectal wall thickening. Appendix not visualized. Gaseous distention of sigmoid loop extending cranially to the LEFT lobe of the liver, 6.8 cm transverse. Remainder of colon decompressed. Stomach decompressed. No bowel dilatation or evidence of obstruction. Mild sigmoid diverticulosis incidentally noted without evidence of diverticulitis. Vascular/Lymphatic: Extensive atherosclerotic calcifications aorta, iliac arteries, and coronary arteries. Significant calcified plaque at the origins of the celiac, superior mesenteric, and BILATERAL renal arteries. Aorta normal caliber. No adenopathy. Reproductive: Mild prostatic enlargement. Other: Small amount of free fluid in pelvis and LEFT mid abdomen. No free air. No hernia. Musculoskeletal: Osseous demineralization. Multilevel degenerative disc disease changes thoracolumbar spine. Chronic superior endplate concavity L1 unchanged. Degenerative changes of both hip joints. IMPRESSION: Gaseous distention of sigmoid loop extending cranially to the LEFT  lobe of the liver, 6.8 cm transverse without wall thickening or obstruction. Mild rectal wall thickening question proctitis; recommend correlation with proctoscopy. Small amount of nonspecific free fluid in pelvis and LEFT mid abdomen. Small to moderate RIGHT pleural effusion, slightly increased. High attenuation material within gallbladder increased from previous exam, could represent layered calculi, milk of calcium or vicarious excretion of contrast if recently administered. Mild prostatic enlargement. Aortic Atherosclerosis (ICD10-I70.0). Electronically Signed   By: Lavonia Dana M.D.   On: 02/13/2020 08:49   DG Chest 2 View  Result Date: 02/13/2020 CLINICAL DATA:  Vomiting.  Difficulty swallowing. EXAM: CHEST - 2 VIEW COMPARISON:  CT 12/06/2019.  Chest x-ray 12/02/2019. FINDINGS: Interim removal of tracheostomy tube. Stable cardiomegaly. Bibasilar atelectasis/infiltrates again noted. Small right pleural effusion again noted. No left pleural effusion noted on today's exam. No pneumothorax. Surgical clips right neck. IMPRESSION: 1.  Interim removal of tracheostomy tube. 2.  Stable cardiomegaly. 3. Bibasilar atelectasis/infiltrates again noted. Small right pleural effusion again noted. No left pleural effusion noted on today's exam. Electronically Signed   By: Marcello Moores  Register   On: 02/13/2020 08:19   CT Head Wo Contrast  Result Date: 02/13/2020 CLINICAL DATA:  Dysphagia EXAM: CT HEAD WITHOUT CONTRAST TECHNIQUE: Contiguous axial images were obtained from the base of the skull through the vertex without intravenous contrast. COMPARISON:  November 28, 2019 FINDINGS: Brain: There is stable mild diffuse atrophy. There is no intracranial mass, hemorrhage, extra-axial fluid collection, or midline shift. Slight small vessel disease in the centra semiovale bilaterally is stable. No acute infarct is demonstrable on this study. Vascular: No hyperdense vessel. There are foci of calcification in the carotid siphon regions  bilaterally. Skull: The bony calvarium appears intact. Sinuses/Orbits: Visualized paranasal sinuses are clear. Visualized orbits appear symmetric bilaterally. Other: Mastoid air cells are clear. IMPRESSION: Atrophy with slight periventricular small vessel disease. No acute infarct. No mass or hemorrhage. There are foci of arterial vascular calcification. Electronically Signed   By: Lowella Grip III M.D.   On: 02/13/2020 08:46   CT Soft Tissue Neck Wo Contrast  Result Date: 02/13/2020 CLINICAL DATA:  Lymphadenopathy, neck. History of throat cancer, difficulty swallowing. Abdominal and throat pain. EXAM: CT NECK WITHOUT CONTRAST TECHNIQUE: Multidetector CT imaging of the neck was performed following the standard protocol without intravenous contrast. COMPARISON:  No pertinent prior exams are available for comparison. FINDINGS: Pharynx and larynx: Prior total laryngectomy. Please note there is limited assessment for residual/recurrent malignancy on this non-contrast examination. Within this limitation, no definite discrete mass is identified within the oral cavity or pharynx. There is prominent debris within the oropharynx and upper esophagus which may reflect a food bolus. There is associated mild dilation of the upper esophagus. The esophagus becomes decompressed at the C7 level. Salivary glands: There are a few nonspecific calcifications within the parotid glands bilaterally. The submandibular glands are unremarkable. Thyroid: The right thyroid lobe is poorly delineated and may be surgically absent. There are subcentimeter nodules within the left thyroid lobe not meeting consensus criteria for ultrasound follow-up. Lymph nodes: Lack of intravenous contrast administration limits evaluation for cervical lymphadenopathy. Within this limitation, no pathologically enlarged or suspicious cervical chain lymph nodes are identified. Vascular: There is limited evaluation of the major vascular structures of the neck on  this noncontrast examination. Atherosclerotic calcifications within the visualized aortic arch, proximal major branch vessels of the neck and carotid arteries. Limited intracranial: No acute intracranial abnormality is identified. Visualized orbits: Visualized orbits show no acute finding. Mastoids and visualized paranasal sinuses: Mild left sphenoid sinus mucosal thickening. No significant mastoid effusion. Skeleton: Advanced cervical spondylosis with multilevel disc space narrowing, posterior disc osteophytes, uncovertebral and facet hypertrophy. There is fusion across the C4-C5 disc space. Also of note, there is advanced C5-C6 disc degeneration with degenerative endplate irregularity and degenerative endplate sclerosis. Prominent multilevel ventral osteophytes, most notably at C5-C6 and C6-C7. Upper chest: Partially visualized right pleural effusion, likely moderate in size. There are multifocal ground-glass opacities within the imaged lung apices measuring up to 8 mm. Other: TEP  device noted. IMPRESSION: Prior total laryngectomy. Within the limitations of a non-contrast examination, no definite residual/recurrent soft tissue neck mass is identified. No pathologically enlarged cervical chain lymph nodes are identified. There is prominent debris within the oropharynx and upper esophagus which may reflect a food bolus. There is associated mild dilation of the upper esophagus. Findings raise the possibility of an upper esophageal stricture with obstruction. An esophagram may be helpful for further evaluation. Prominent ventral osteophytes most notably at the C5-C6 and C6-C7 levels, which may exert some mass effect upon the upper esophagus. Multifocal ground-glass opacities within the imaged lung apices measuring up to 8 mm. Findings may be infectious/inflammatory in etiology. However, follow-up is recommended to exclude alternative etiologies (i.e. neoplasm). Non-contrast chest CT at 3-6 months is recommended. If  nodules persist, subsequent management will be based upon the most suspicious nodule(s). This recommendation follows the consensus statement: Guidelines for Management of Incidental Pulmonary Nodules Detected on CT Images: From the Fleischner Society 2017; Radiology 2017; 284:228-243. Partially imaged right pleural effusion, likely moderate in size. Electronically Signed   By: Kellie Simmering DO   On: 02/13/2020 09:07   DG ESOPHAGUS W SINGLE CM (SOL OR THIN BA)  Result Date: 02/14/2020 CLINICAL DATA:  Dysphagia EXAM: ESOPHAGUS/BARIUM SWALLOW/TABLET STUDY TECHNIQUE: Initial scout AP supine abdominal image obtained to insure adequate colon cleansing. Barium was introduced into the colon in a retrograde fashion and refluxed from the rectum to the cecum. Spot images of the colon followed by overhead radiographs were obtained. FLUOROSCOPY TIME:  Fluoroscopy Time:  1.4 minute Radiation Exposure Index (if provided by the fluoroscopic device): 10.5 mGy Number of Acquired Spot Images: 0 COMPARISON:  CT neck 02/13/2020 FINDINGS: Normal pharyngeal anatomy and motility. Severe focal esophageal narrowing at the level of the tracheostomy tube restricting passage of contrast and making it difficult for the patient to swallow the oral bolus. There is no complete obstruction. Tertiary contractions of the distal half of the esophagus as can be seen with mild spasm. No definite hiatal hernia was demonstrated. IMPRESSION: Severe focal esophageal narrowing at the level of the tracheostomy tube restricting passage of contrast most consistent with a high-grade stricture, making it difficult for the patient to swallow the oral bolus. No complete obstruction. Tertiary contractions of the distal half of the esophagus as can be seen with mild spasm. Electronically Signed   By: Kathreen Devoid   On: 02/14/2020 09:49    Review of Systems  Constitutional: Negative for chills and fatigue.  HENT: Positive for trouble swallowing. Negative for  sinus pain.   Respiratory: Negative for cough and shortness of breath.   Cardiovascular: Negative for chest pain and palpitations.  Gastrointestinal: Negative for nausea and vomiting.  Genitourinary: Negative for frequency and urgency.  Musculoskeletal:       Patient relates pain with his left great toe.  Skin:       Patient relates some redness and swelling in his left great toe  Neurological:       Denies numbness or paresthesias.  Psychiatric/Behavioral: Negative for confusion. The patient is not nervous/anxious.    Blood pressure 123/77, pulse 73, temperature 98.4 F (36.9 C), temperature source Oral, resp. rate 16, height 5' 6" (1.676 m), weight 58 kg, SpO2 100 %. Physical Exam Cardiovascular:     Comments: DP pulse is 2/4 on the left, 1/4 on the right.  PT pulse is thready on the right and nonpalpable on the left. Musculoskeletal:     Comments: Adequate range of  motion of the pedal joints.  Muscle testing deferred.  Skin:    Comments: The skin is warm dry and atrophic.  Some focal erythema and edema in the left hallux.  Medial border the left hallux nail is ingrown into the nail fold with some mild drainage.  Neurological:     Comments: Protective threshold with a monofilament wire appears to be grossly intact and symmetric to the digits with possibly the exception of the left hallux.     Assessment/Plan: Assessment: 1.  Onychocryptosis with paronychia left hallux. 2.  Onychomycosis. 3.  Diabetes.  Plan: Debrided the ingrown left hallux nail with curettement of the nail border.  Orders placed for daily wound care with mupirocin ointment and a light bandage.  Debrided the other toenails as a courtesy today.  Patient may follow-up outpatient as needed.  Durward Fortes 02/14/2020, 10:01 PM

## 2020-02-14 NOTE — Hospital Course (Addendum)
Kerry [name] is a 86 y.o. 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  Vitals and labs in the ED were essentially unremarkable.  Labs consistent with ESRD, mild thrombocytopenia.    CT neck soft tissue showed "prominent debris within the oropharynx and upper esophagus which may reflect a food bolus. There is associated mild dilation of the upper esophagus. Findings raise the possibility of an upper esophageal stricture with obstruction.  Admitted for observation with GI consulted.  Barium esophagram on 9/10 confirmed severe focal esophageal narrowing at the level of the tracheostomy tube restricting passage of contrast most consistent with a high-grade stricture.  Eliquis is on hold.  Heparin infusion stopped 9/13 for worsening thrombocytopenia. Checking HIT Ab.    EGD by Dr. Vicente Males on 9/14 showed no stricture.  The end of the "tracheostomy tube" seen in the proximal esophagus (this is TEP plate, patient no longer has trach tube, only stoma).

## 2020-02-14 NOTE — Progress Notes (Signed)
Pt is on a trach mask with O2 bleedin at 2 lpm without humidity. This is the setup he was on pta. He states he does not want humidification & prefers to stay on the current setup.

## 2020-02-14 NOTE — Progress Notes (Signed)
[name], MD [address]  [place]  [place], [place] [address]  Main: [phone]  Fax: [phone] Pager: [phone]   Subjective: No acute events overnight, patient underwent barium esophagram today, which revealed severe focal esophageal narrowing at the level of the tracheostomy tube, restricting passage of contrast consistent with high-grade stricture.  No evidence of complete obstruction   Objective: Vital signs in last 24 hours: Vitals:   02/14/20 0055 02/14/20 0100 02/14/20 0808 02/14/20 1119  BP:   108/66 107/71  Pulse:   100 84  Resp:   18 19  Temp:   98.2 F (36.8 C) 98.4 F (36.9 C)  TempSrc:   Oral Oral  SpO2: 98%  100% 98%  Weight:  58 kg    Height:  5' 6"  (1.676 m)     Weight change:   Intake/Output Summary (Last 24 hours) at 02/14/2020 1549 Last data filed at 02/14/2020 1029 Gross per 24 hour  Intake 437.93 ml  Output 0 ml  Net 437.93 ml     Exam: Heart:: Regular rate and rhythm, S1S2 present or without murmur or extra heart sounds Lungs: normal and clear to auscultation Abdomen: soft, nontender, normal bowel sounds   Lab Results: CBC Latest Ref Rng & Units 02/14/2020 02/12/2020 12/17/2019  WBC 4.0 - 10.5 K/uL 4.6 5.8 7.0  Hemoglobin 13.0 - 17.0 g/dL 13.2 13.0 9.4(L)  Hematocrit 39 - 52 % 36.0(L) 37.4(L) 25.7(L)  Platelets 150 - 400 K/uL 123(L) 153 229   CMP Latest Ref Rng & Units 02/14/2020 02/13/2020 02/12/2020  Glucose 70 - 99 mg/dL 119(H) 88 55(L)  BUN 8 - 23 mg/dL 25(H) 54(H) 45(H)  Creatinine 0.61 - 1.24 mg/dL 4.44(H) 7.00(H) 5.98(H)  Sodium 135 - 145 mmol/L 139 141 142  Potassium 3.5 - 5.1 mmol/L 3.7 4.5 4.3  Chloride 98 - 111 mmol/L 97(L) 98 99  CO2 22 - 32 mmol/L 29 25 26   Calcium 8.9 - 10.3 mg/dL 8.6(L) 9.1 9.0  Total Protein 6.5 - 8.1 g/dL - - 7.6  Total Bilirubin 0.3 - 1.2 mg/dL - - 1.4(H)  Alkaline Phos 38 - 126 U/L - - 54  AST 15 - 41 U/L - - 19  ALT 0 - 44 U/L - - 21    Micro Results: Recent Results (from  the past 240 hour(s))  SARS Coronavirus 2 by RT PCR (hospital order, performed in [facility] hospital lab) Nasopharyngeal Nasopharyngeal Swab     Status: None   Collection Time: 02/13/20  7:53 AM   Specimen: Nasopharyngeal Swab  Result Value Ref Range Status   SARS Coronavirus 2 NEGATIVE NEGATIVE Final    Comment: (NOTE) SARS-CoV-2 target nucleic acids are NOT DETECTED.  The SARS-CoV-2 RNA is generally detectable in upper and lower respiratory specimens during the acute phase of infection. The lowest concentration of SARS-CoV-2 viral copies this assay can detect is 250 copies / mL. A negative result does not preclude SARS-CoV-2 infection and should not be used as the sole basis for treatment or other patient management decisions.  A negative result may occur with improper specimen collection / handling, submission of specimen other than nasopharyngeal swab, presence of viral mutation(s) within the areas targeted by this assay, and inadequate number of viral copies (<250 copies / mL). A negative result must be combined with clinical observations, patient history, and epidemiological information.  Fact Sheet for Patients:   StrictlyIdeas.no  Fact Sheet for Healthcare Providers: BankingDealers.co.za  This test is not yet approved  or  cleared by the [place] and has been authorized for detection and/or diagnosis of SARS-CoV-2 by FDA under an Emergency Use Authorization (EUA).  This EUA will remain in effect (meaning this test can be used) for the duration of the COVID-19 declaration under Section 564(b)(1) of the Act, 21 U.S.C. section 360bbb-3(b)(1), unless the authorization is terminated or revoked sooner.  Performed at [facility], [address], [place], [place] [address]   Surgical PCR screen     Status: None   Collection Time: 02/14/20  1:21 AM   Specimen: Nasal Mucosa; Nasal Swab  Result Value Ref Range  Status   MRSA, PCR NEGATIVE NEGATIVE Final   Staphylococcus aureus NEGATIVE NEGATIVE Final    Comment: (NOTE) The Xpert SA Assay (FDA approved for NASAL specimens in patients 58 years of age and older), is one component of a comprehensive surveillance program. It is not intended to diagnose infection nor to guide or monitor treatment. Performed at [facility], [address] [place], [place] [address]    Studies/Results: CT ABDOMEN PELVIS WO CONTRAST  Result Date: 02/13/2020 CLINICAL DATA:  Vomiting, hypoglycemia, abdominal pain, question bowel obstruction, dialysis patient EXAM: CT ABDOMEN AND PELVIS WITHOUT CONTRAST TECHNIQUE: Multidetector CT imaging of the abdomen and pelvis was performed following the standard protocol without IV contrast. Sagittal and coronal MPR images reconstructed from axial data set. Oral contrast was not administered. COMPARISON:  12/06/2019 FINDINGS: Lower chest: Small to moderate RIGHT pleural effusion, slightly increased. Resolution of previously seen LEFT pleural effusion. Compressive atelectasis RIGHT lower lobe. Enlargement of cardiac chambers. Hepatobiliary: High attenuation material within gallbladder increased from previous exam, could represent layered calculi, milk of calcium or vicarious excretion of contrast if recently administered. No definite focal hepatic abnormality. Pancreas: Normal appearance Spleen: Normal appearance Adrenals/Urinary Tract: Thickening of adrenal glands without discrete mass. Atrophic kidneys consistent with history of dialysis. Questionable tiny hyperdense cyst 4 mm diameter at mid RIGHT kidney. No additional renal mass or hydronephrosis. Ureters and bladder decompressed. Stomach/Bowel: Mild rectal wall thickening. Appendix not visualized. Gaseous distention of sigmoid loop extending cranially to the LEFT lobe of the liver, 6.8 cm transverse. Remainder of colon decompressed. Stomach decompressed. No bowel dilatation or  evidence of obstruction. Mild sigmoid diverticulosis incidentally noted without evidence of diverticulitis. Vascular/Lymphatic: Extensive atherosclerotic calcifications aorta, iliac arteries, and coronary arteries. Significant calcified plaque at the origins of the celiac, superior mesenteric, and BILATERAL renal arteries. Aorta normal caliber. No adenopathy. Reproductive: Mild prostatic enlargement. Other: Small amount of free fluid in pelvis and LEFT mid abdomen. No free air. No hernia. Musculoskeletal: Osseous demineralization. Multilevel degenerative disc disease changes thoracolumbar spine. Chronic superior endplate concavity L1 unchanged. Degenerative changes of both hip joints. IMPRESSION: Gaseous distention of sigmoid loop extending cranially to the LEFT lobe of the liver, 6.8 cm transverse without wall thickening or obstruction. Mild rectal wall thickening question proctitis; recommend correlation with proctoscopy. Small amount of nonspecific free fluid in pelvis and LEFT mid abdomen. Small to moderate RIGHT pleural effusion, slightly increased. High attenuation material within gallbladder increased from previous exam, could represent layered calculi, milk of calcium or vicarious excretion of contrast if recently administered. Mild prostatic enlargement. Aortic Atherosclerosis (ICD10-I70.0). Electronically Signed   By: Lavonia Dana M.D.   On: 02/13/2020 08:49   DG Chest 2 View  Result Date: 02/13/2020 CLINICAL DATA:  Vomiting.  Difficulty swallowing. EXAM: CHEST - 2 VIEW COMPARISON:  CT 12/06/2019.  Chest x-ray 12/02/2019. FINDINGS: Interim removal of tracheostomy  tube. Stable cardiomegaly. Bibasilar atelectasis/infiltrates again noted. Small right pleural effusion again noted. No left pleural effusion noted on today's exam. No pneumothorax. Surgical clips right neck. IMPRESSION: 1.  Interim removal of tracheostomy tube. 2.  Stable cardiomegaly. 3. Bibasilar atelectasis/infiltrates again noted. Small  right pleural effusion again noted. No left pleural effusion noted on today's exam. Electronically Signed   By: Marcello Moores  Register   On: 02/13/2020 08:19   CT Head Wo Contrast  Result Date: 02/13/2020 CLINICAL DATA:  Dysphagia EXAM: CT HEAD WITHOUT CONTRAST TECHNIQUE: Contiguous axial images were obtained from the base of the skull through the vertex without intravenous contrast. COMPARISON:  November 28, 2019 FINDINGS: Brain: There is stable mild diffuse atrophy. There is no intracranial mass, hemorrhage, extra-axial fluid collection, or midline shift. Slight small vessel disease in the centra semiovale bilaterally is stable. No acute infarct is demonstrable on this study. Vascular: No hyperdense vessel. There are foci of calcification in the carotid siphon regions bilaterally. Skull: The bony calvarium appears intact. Sinuses/Orbits: Visualized paranasal sinuses are clear. Visualized orbits appear symmetric bilaterally. Other: Mastoid air cells are clear. IMPRESSION: Atrophy with slight periventricular small vessel disease. No acute infarct. No mass or hemorrhage. There are foci of arterial vascular calcification. Electronically Signed   By: Lowella Grip III M.D.   On: 02/13/2020 08:46   CT Soft Tissue Neck Wo Contrast  Result Date: 02/13/2020 CLINICAL DATA:  Lymphadenopathy, neck. History of throat cancer, difficulty swallowing. Abdominal and throat pain. EXAM: CT NECK WITHOUT CONTRAST TECHNIQUE: Multidetector CT imaging of the neck was performed following the standard protocol without intravenous contrast. COMPARISON:  No pertinent prior exams are available for comparison. FINDINGS: Pharynx and larynx: Prior total laryngectomy. Please note there is limited assessment for residual/recurrent malignancy on this non-contrast examination. Within this limitation, no definite discrete mass is identified within the oral cavity or pharynx. There is prominent debris within the oropharynx and upper esophagus which may  reflect a food bolus. There is associated mild dilation of the upper esophagus. The esophagus becomes decompressed at the C7 level. Salivary glands: There are a few nonspecific calcifications within the parotid glands bilaterally. The submandibular glands are unremarkable. Thyroid: The right thyroid lobe is poorly delineated and may be surgically absent. There are subcentimeter nodules within the left thyroid lobe not meeting consensus criteria for ultrasound follow-up. Lymph nodes: Lack of intravenous contrast administration limits evaluation for cervical lymphadenopathy. Within this limitation, no pathologically enlarged or suspicious cervical chain lymph nodes are identified. Vascular: There is limited evaluation of the major vascular structures of the neck on this noncontrast examination. Atherosclerotic calcifications within the visualized aortic arch, proximal major branch vessels of the neck and carotid arteries. Limited intracranial: No acute intracranial abnormality is identified. Visualized orbits: Visualized orbits show no acute finding. Mastoids and visualized paranasal sinuses: Mild left sphenoid sinus mucosal thickening. No significant mastoid effusion. Skeleton: Advanced cervical spondylosis with multilevel disc space narrowing, posterior disc osteophytes, uncovertebral and facet hypertrophy. There is fusion across the C4-C5 disc space. Also of note, there is advanced C5-C6 disc degeneration with degenerative endplate irregularity and degenerative endplate sclerosis. Prominent multilevel ventral osteophytes, most notably at C5-C6 and C6-C7. Upper chest: Partially visualized right pleural effusion, likely moderate in size. There are multifocal ground-glass opacities within the imaged lung apices measuring up to 8 mm. Other: TEP device noted. IMPRESSION: Prior total laryngectomy. Within the limitations of a non-contrast examination, no definite residual/recurrent soft tissue neck mass is identified. No  pathologically enlarged cervical chain  lymph nodes are identified. There is prominent debris within the oropharynx and upper esophagus which may reflect a food bolus. There is associated mild dilation of the upper esophagus. Findings raise the possibility of an upper esophageal stricture with obstruction. An esophagram may be helpful for further evaluation. Prominent ventral osteophytes most notably at the C5-C6 and C6-C7 levels, which may exert some mass effect upon the upper esophagus. Multifocal ground-glass opacities within the imaged lung apices measuring up to 8 mm. Findings may be infectious/inflammatory in etiology. However, follow-up is recommended to exclude alternative etiologies (i.e. neoplasm). Non-contrast chest CT at 3-6 months is recommended. If nodules persist, subsequent management will be based upon the most suspicious nodule(s). This recommendation follows the consensus statement: Guidelines for Management of Incidental Pulmonary Nodules Detected on CT Images: From the Fleischner Society 2017; Radiology 2017; 284:228-243. Partially imaged right pleural effusion, likely moderate in size. Electronically Signed   By: Kellie Simmering DO   On: 02/13/2020 09:07   DG ESOPHAGUS W SINGLE CM (SOL OR THIN BA)  Result Date: 02/14/2020 CLINICAL DATA:  Dysphagia EXAM: ESOPHAGUS/BARIUM SWALLOW/TABLET STUDY TECHNIQUE: Initial scout AP supine abdominal image obtained to insure adequate colon cleansing. Barium was introduced into the colon in a retrograde fashion and refluxed from the rectum to the cecum. Spot images of the colon followed by overhead radiographs were obtained. FLUOROSCOPY TIME:  Fluoroscopy Time:  1.4 minute Radiation Exposure Index (if provided by the fluoroscopic device): 10.5 mGy Number of Acquired Spot Images: 0 COMPARISON:  CT neck 02/13/2020 FINDINGS: Normal pharyngeal anatomy and motility. Severe focal esophageal narrowing at the level of the tracheostomy tube restricting passage of  contrast and making it difficult for the patient to swallow the oral bolus. There is no complete obstruction. Tertiary contractions of the distal half of the esophagus as can be seen with mild spasm. No definite hiatal hernia was demonstrated. IMPRESSION: Severe focal esophageal narrowing at the level of the tracheostomy tube restricting passage of contrast most consistent with a high-grade stricture, making it difficult for the patient to swallow the oral bolus. No complete obstruction. Tertiary contractions of the distal half of the esophagus as can be seen with mild spasm. Electronically Signed   By: Kathreen Devoid   On: 02/14/2020 09:49   Medications:  I have reviewed the patient's current medications. Prior to Admission:  Medications Prior to Admission  Medication Sig Dispense Refill Last Dose  . amiodarone (PACERONE) 100 MG tablet Take 1 tablet (100 mg total) by mouth daily. 30 tablet 0 02/12/2020 at 0800  . apixaban (ELIQUIS) 5 MG TABS tablet Two tablets twice a day through 12/17/2019 then one tablet twice aday afterwards 61 tablet 0 02/12/2020 at 0800  . B Complex-C-Folic Acid (TRIPHROCAPS) 1 MG CAPS Take 1 capsule by mouth daily.   02/12/2020 at 1300  . epoetin alfa (EPOGEN) 2000 UNIT/ML injection Inject 1 mL (2,000 Units total) into the vein Every Tuesday,Thursday,and Saturday with dialysis. 1 mL  Past Week at Unknown time  . EUTHYROX 50 MCG tablet Take 50 mcg by mouth daily.   02/12/2020 at 0800  . ferrous sulfate 325 (65 FE) MG EC tablet Take 325 mg by mouth daily with breakfast.   02/12/2020 at 0800  . glipiZIDE (GLUCOTROL) 5 MG tablet Take 5 mg by mouth every morning.   02/12/2020 at 0800  . midodrine (PROAMATINE) 10 MG tablet Take 10 mg by mouth 3 (three) times daily.   02/12/2020 at 1300  . sevelamer carbonate (RENVELA) 800 MG  tablet Take 800-1,600 mg by mouth in the morning, at noon, in the evening, and at bedtime.   02/12/2020 at 1200  . atorvastatin (LIPITOR) 80 MG tablet Take 80 mg by mouth daily.     02/11/2020 at 2000  . chlorhexidine gluconate, MEDLINE KIT, (PERIDEX) 0.12 % solution 15 mLs by Mouth Rinse route 2 (two) times daily. 120 mL 0   . glucagon (GLUCAGON EMERGENCY) 1 MG injection as needed. Reported on 06/02/2015   prn at prn  . glucose blood (PRECISION QID TEST) test strip Accu check Aviva strips, use twice daily     . LANTUS SOLOSTAR 100 UNIT/ML Solostar Pen Inject 2 Units into the skin daily after breakfast.    02/09/2020  . lidocaine-prilocaine (EMLA) cream Apply 1 application topically as needed (topical anesthesia for hemodialysis if Gebauers and Lidocaine injection are ineffective.). 30 g 0 prn at prn  . melatonin 5 MG TABS Take 0.5 tablets (2.5 mg total) by mouth at bedtime. 30 tablet 0 02/11/2020 at 2000  . polyethylene glycol (MIRALAX / GLYCOLAX) 17 g packet Take 17 g by mouth daily as needed for moderate constipation. 14 each 0 prn at prn   Scheduled: . amiodarone  100 mg Oral Daily  . chlorhexidine gluconate (MEDLINE KIT)  15 mL Mouth Rinse BID  . [START ON 02/17/2020] levothyroxine  25 mcg Intravenous Q0600  . sodium chloride flush  3 mL Intravenous Q12H   Continuous: . sodium chloride    . dextrose 25 mL/hr at 02/14/20 0044  . heparin 700 Units/hr (02/14/20 1456)  . potassium chloride 10 mEq (02/14/20 1313)   TGG:YIRSWN chloride, acetaminophen **OR** acetaminophen, albuterol, dextrose, hydrALAZINE, lidocaine-prilocaine, ondansetron (ZOFRAN) IV, polyethylene glycol, sodium chloride flush Anti-infectives (From admission, onward)   None     Scheduled Meds: . amiodarone  100 mg Oral Daily  . chlorhexidine gluconate (MEDLINE KIT)  15 mL Mouth Rinse BID  . [START ON 02/17/2020] levothyroxine  25 mcg Intravenous Q0600  . sodium chloride flush  3 mL Intravenous Q12H   Continuous Infusions: . sodium chloride    . dextrose 25 mL/hr at 02/14/20 0044  . heparin 700 Units/hr (02/14/20 1456)  . potassium chloride 10 mEq (02/14/20 1313)   PRN Meds:.sodium chloride,  acetaminophen **OR** acetaminophen, albuterol, dextrose, hydrALAZINE, lidocaine-prilocaine, ondansetron (ZOFRAN) IV, polyethylene glycol, sodium chloride flush   Assessment: Principal Problem:   Difficulty swallowing Active Problems:   Anemia of chronic disease   Coronary artery disease   Hyperlipidemia   Hypertension   Squamous cell cancer of hypopharynx (HCC)   Acute deep vein thrombosis (DVT) of right upper extremity (HCC)   AF (paroxysmal atrial fibrillation) (HCC)   Chronic systolic CHF (congestive heart failure) (HCC)   ESRD (end stage renal disease) (HCC)   Hypoglycemia   COPD (chronic obstructive pulmonary disease) ([place])   Type II diabetes mellitus with renal manifestations ([place])   Plan: Niranjan Rufener is a 86 y.o. male with history of laryngeal cancer status post laryngectomy, status post tracheostomy, currently on trach collar, CHF EF of 20 to 30%, paroxysmal A. fib, V. 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  If it is a high-grade stricture, discussed with patient's wife that we may have to consider surgical gastrostomy tube placement if the dilation is  not successful Continue IV fluids and monitor electrolytes closely N.p.o. except meds, okay to try clear liquids if patient is able to tolerate  Dr. Bonna Gains will cover for the weekend   LOS: 1 day   Ankush Gintz 02/14/2020, 3:49 PM

## 2020-02-14 NOTE — Progress Notes (Addendum)
PROGRESS NOTE    Kerry [name]   QTM:[id number]  DOB: [birth year]  PCP: Cecile Sheerer, MD    DOA: 02/13/2020 LOS: 1   Brief Narrative   Kerry [name] is a 86 y.o. 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  Vitals and labs in the ED were essentially unremarkable.  Labs consistent with ESRD, mild thrombocytopenia.    CT neck soft tissue showed "prominent debris within the oropharynx and upper esophagus which may reflect a food bolus. There is associated mild dilation of the upper esophagus. Findings raise the possibility of an upper esophageal stricture with obstruction.  Admitted for observation with GI consulted.  Barium esophagram on 9/10 confirmed severe focal esophageal narrowing at the level of the tracheostomy tube restricting passage of contrast most consistent with a high-grade stricture.  Eliquis is on hold and GI plans for EGD after 3 days off anticoagulation.     Assessment & Plan   Principal Problem:   Difficulty swallowing Active Problems:   Anemia of chronic disease   Coronary artery disease   Hyperlipidemia   Hypertension   Squamous cell cancer of hypopharynx (HCC)   Acute deep vein thrombosis (DVT) of right upper extremity (HCC)   AF (paroxysmal atrial fibrillation) (HCC)   Chronic systolic CHF (congestive heart failure) (HCC)   ESRD (end stage renal disease) (HCC)   Hypoglycemia   COPD (chronic obstructive pulmonary disease) (HCC)   Type II diabetes mellitus with renal manifestations ([place])   Difficulty swallowing due to esophageal stricture / narrowing - present on admission, to tolerance for any PO intake x 2 days  prior to admission.  GI is consulted.   Plan for EGD on Monday (off Eliquis 72 hours).  Hold Eliquis.  Cleared by cardiology for procedure.  Hypoglycemia - present on admission.  Continue D10 infusion. CBG's every 2 hours to monitor.  D50 PRN.  Hold insulin.  Thrombocytopenia - mild, present on admission with platelets 123k.  Monitor CBC closely, is on heparin.  Anemia of chronic disease - Hgb 13.0 on admission.  Monitor CBC.  Continue iron supplement.  Coronary artery disease - stable, no chest pain.  Continue Lipitor.  Not on beta blocker.  Hyperlipidemia - continue Lipitor  Type II diabetes mellitus with renal manifestations -  Recent A1c 5.5, well controlled.  Patient taking Lantus and glipizide.   Presented with hypoglycemia.  Hold Lantus and glipizide.   Hx of Hypertension - BP soft on admission, likely due to no PO intake and hypovolemia.  Is also on midodrine at home. -IV hydralazine as needed.  Hold midodrine until after EGD.  Squamous cell cancer of hypopharynx - s/p of tracheostomy with stoma on trach collar.  Follow-up in [place].  Acute DVT of right upper extremity - stable. Hold Eliquis fpr EGD, continue heparin infusion.  Paroxysmal A-fib - rate controlled.  Continue amiodarone.  Per cardiology, they'll consider transition to amio gtt if needed.  Hold Eliquis for EGD.  Chronic systolic CHF - appears euvolemic, asymptomatic and currently well-compensated.    Echo on 11/30/2019 showed EF of 20-30%.  Volume management by dialysis.  ESRD - on T/Th/S hemodialysis.  Nephrology consulted for dialysis. Will get  tomorrow.  COPD - albuterol nebs PRN   Patient BMI: Body mass index is 20.64 kg/m.   DVT prophylaxis:    Diet:  Diet Orders (From admission, onward)    Start     Ordered   02/13/20 0957  Diet NPO time specified Except for: Sips with Meds, Ice Chips  Diet effective now       Question Answer Comment  Except for Sips with Meds   Except for Ice Chips       02/13/20 0957            Code Status: Full Code    Subjective 02/14/20    Patient seen with wife at bedside this AM.  He barely got down his amio pill this AM, per his nurse.  He and wife confirm he hasn't even been able to tolerate swallowing liquids at home.  Denies any other acute complaints.  No acute events reported.   Disposition Plan & Communication   Status is: Inpatient  Remains inpatient appropriate because:Ongoing diagnostic testing needed not appropriate for outpatient work up   Dispo: The patient is from: Home              Anticipated d/c is to: Home              Anticipated d/c date is: > 3 days              Patient currently is not medically stable to d/c.        Family Communication: wife at bedside during encounter, 9/10    Consults, Procedures, Significant Events   Consultants:   Gastroenterology  Nephrology  Cardiology  Procedures:   Barium esophagram 9/10  Antimicrobials:   None    Objective   Vitals:   02/14/20 0100 02/14/20 0808 02/14/20 1119 02/14/20 1616  BP:  108/66 107/71 123/83  Pulse:  100 84 93  Resp:  _0 Temp:  98.2 F (36.8 C) 98.4 F (36.9 C) 98.4 F (36.9 C)  TempSrc:  Oral Oral Oral  SpO2:  100% 98% 100%  Weight: 58 kg     Height: _1  (1.676 m)       Intake/Output Summary (Last 24 hours) at 02/14/2020 1912 Last data filed at 02/14/2020 1119 Gross per 24 hour  Intake 437.93 ml  Output 0 ml  Net 437.93 ml   Filed Weights   02/13/20 1703 02/14/20 0100  Weight: 57.7 kg 58 kg    Physical Exam:  General exam: awake, alert, no acute distress HEENT: hearing grossly normal, stoma appears clean and without secretions, trach collar in place Respiratory system: CTAB, no wheezes, rales or rhonchi, normal respiratory effort. Cardiovascular system: normal S1/S2, RRR, no pedal edema.   Gastrointestinal system: soft, NT, ND, +bowel sounds. Central nervous system: A&O x3. no gross focal neurologic  deficits Extremities: moves all, no edema, normal tone Psychiatry: normal mood, congruent affect, judgement and insight appear normal  Labs   Data Reviewed: I have personally reviewed following labs and imaging studies  CBC: Recent Labs  Lab 02/12/20 1921 02/14/20 0103  WBC 5.8 4.6  HGB 13.0 13.2  HCT 37.4* 36.0*  MCV 94.4 90.7  PLT 153 601*   Basic Metabolic Panel: Recent Labs  Lab 02/12/20 1921 02/13/20 1007 02/14/20 0103  NA 142 141 139  K 4.3 4.5 3.7  CL 99 98 97*  CO2 _2 GLUCOSE 55* 88 119*  BUN 45* 54* 25*  CREATININE 5.98* 7.00*  4.44*  CALCIUM 9.0 9.1 8.6*   GFR: Estimated Creatinine Clearance: 10.2 mL/min (A) (by C-G formula based on SCr of 4.44 mg/dL (H)). Liver Function Tests: Recent Labs  Lab 02/12/20 1921  AST 19  ALT 21  ALKPHOS 54  BILITOT 1.4*  PROT 7.6  ALBUMIN 4.3   Recent Labs  Lab 02/12/20 1921  LIPASE 26   No results for input(s): AMMONIA in the last 168 hours. Coagulation Profile: Recent Labs  Lab 02/13/20 1009  INR 1.8*   Cardiac Enzymes: No results for input(s): CKTOTAL, CKMB, CKMBINDEX, TROPONINI in the last 168 hours. BNP (last 3 results) No results for input(s): PROBNP in the last 8760 hours. HbA1C: No results for input(s): HGBA1C in the last 72 hours. CBG: Recent Labs  Lab 02/14/20 1003 02/14/20 1152 02/14/20 1437 02/14/20 1618 02/14/20 1805  GLUCAP 116* 123* 108* 102* 102*   Lipid Profile: No results for input(s): CHOL, HDL, LDLCALC, TRIG, CHOLHDL, LDLDIRECT in the last 72 hours. Thyroid Function Tests: No results for input(s): TSH, T4TOTAL, FREET4, T3FREE, THYROIDAB in the last 72 hours. Anemia Panel: No results for input(s): VITAMINB12, FOLATE, FERRITIN, TIBC, IRON, RETICCTPCT in the last 72 hours. Sepsis Labs: No results for input(s): PROCALCITON, LATICACIDVEN in the last 168 hours.  Recent Results (from the past 240 hour(s))  SARS Coronavirus 2 by RT PCR (hospital order, performed in [facility]  hospital lab) Nasopharyngeal Nasopharyngeal Swab     Status: None   Collection Time: 02/13/20  7:53 AM   Specimen: Nasopharyngeal Swab  Result Value Ref Range Status   SARS Coronavirus 2 NEGATIVE NEGATIVE Final    Comment: (NOTE) SARS-CoV-2 target nucleic acids are NOT DETECTED.  The SARS-CoV-2 RNA is generally detectable in upper and lower respiratory specimens during the acute phase of infection. The lowest concentration of SARS-CoV-2 viral copies this assay can detect is 250 copies / mL. A negative result does not preclude SARS-CoV-2 infection and should not be used as the sole basis for treatment or other patient management decisions.  A negative result may occur with improper specimen collection / handling, submission of specimen other than nasopharyngeal swab, presence of viral mutation(s) within the areas targeted by this assay, and inadequate number of viral copies (<250 copies / mL). A negative result must be combined with clinical observations, patient history, and epidemiological information.  Fact Sheet for Patients:   StrictlyIdeas.no  Fact Sheet for Healthcare Providers: BankingDealers.co.za  This test is not yet approved or  cleared by the [place] FDA and has been authorized for detection and/or diagnosis of SARS-CoV-2 by FDA under an Emergency Use Authorization (EUA).  This EUA will remain in effect (meaning this test can be used) for the duration of the COVID-19 declaration under Section 564(b)(1) of the Act, 21 U.S.C. section 360bbb-3(b)(1), unless the authorization is terminated or revoked sooner.  Performed at [facility], [address] [place], [place] [address]   Surgical PCR screen     Status: None   Collection Time: 02/14/20  1:21 AM   Specimen: Nasal Mucosa; Nasal Swab  Result Value Ref Range Status   MRSA, PCR NEGATIVE NEGATIVE Final   Staphylococcus aureus NEGATIVE NEGATIVE Final     Comment: (NOTE) The Xpert SA Assay (FDA approved for NASAL specimens in patients 30 years of age and older), is one component of a comprehensive surveillance program. It is not intended to diagnose infection nor to guide or monitor treatment. Performed at [facility], [address] [place],  [place] [address]       Imaging Studies   CT ABDOMEN PELVIS WO CONTRAST  Result Date: 02/13/2020 CLINICAL DATA:  Vomiting, hypoglycemia, abdominal pain, question bowel obstruction, dialysis patient EXAM: CT ABDOMEN AND PELVIS WITHOUT CONTRAST TECHNIQUE: Multidetector CT imaging of the abdomen and pelvis was performed following the standard protocol without IV contrast. Sagittal and coronal MPR images reconstructed from axial data set. Oral contrast was not administered. COMPARISON:  12/06/2019 FINDINGS: Lower chest: Small to moderate RIGHT pleural effusion, slightly increased. Resolution of previously seen LEFT pleural effusion. Compressive atelectasis RIGHT lower lobe. Enlargement of cardiac chambers. Hepatobiliary: High attenuation material within gallbladder increased from previous exam, could represent layered calculi, milk of calcium or vicarious excretion of contrast if recently administered. No definite focal hepatic abnormality. Pancreas: Normal appearance Spleen: Normal appearance Adrenals/Urinary Tract: Thickening of adrenal glands without discrete mass. Atrophic kidneys consistent with history of dialysis. Questionable tiny hyperdense cyst 4 mm diameter at mid RIGHT kidney. No additional renal mass or hydronephrosis. Ureters and bladder decompressed. Stomach/Bowel: Mild rectal wall thickening. Appendix not visualized. Gaseous distention of sigmoid loop extending cranially to the LEFT lobe of the liver, 6.8 cm transverse. Remainder of colon decompressed. Stomach decompressed. No bowel dilatation or evidence of obstruction. Mild sigmoid diverticulosis incidentally noted without evidence of  diverticulitis. Vascular/Lymphatic: Extensive atherosclerotic calcifications aorta, iliac arteries, and coronary arteries. Significant calcified plaque at the origins of the celiac, superior mesenteric, and BILATERAL renal arteries. Aorta normal caliber. No adenopathy. Reproductive: Mild prostatic enlargement. Other: Small amount of free fluid in pelvis and LEFT mid abdomen. No free air. No hernia. Musculoskeletal: Osseous demineralization. Multilevel degenerative disc disease changes thoracolumbar spine. Chronic superior endplate concavity L1 unchanged. Degenerative changes of both hip joints. IMPRESSION: Gaseous distention of sigmoid loop extending cranially to the LEFT lobe of the liver, 6.8 cm transverse without wall thickening or obstruction. Mild rectal wall thickening question proctitis; recommend correlation with proctoscopy. Small amount of nonspecific free fluid in pelvis and LEFT mid abdomen. Small to moderate RIGHT pleural effusion, slightly increased. High attenuation material within gallbladder increased from previous exam, could represent layered calculi, milk of calcium or vicarious excretion of contrast if recently administered. Mild prostatic enlargement. Aortic Atherosclerosis (ICD10-I70.0). Electronically Signed   By: Lavonia Dana M.D.   On: 02/13/2020 08:49   DG Chest 2 View  Result Date: 02/13/2020 CLINICAL DATA:  Vomiting.  Difficulty swallowing. EXAM: CHEST - 2 VIEW COMPARISON:  CT 12/06/2019.  Chest x-ray 12/02/2019. FINDINGS: Interim removal of tracheostomy tube. Stable cardiomegaly. Bibasilar atelectasis/infiltrates again noted. Small right pleural effusion again noted. No left pleural effusion noted on today's exam. No pneumothorax. Surgical clips right neck. IMPRESSION: 1.  Interim removal of tracheostomy tube. 2.  Stable cardiomegaly. 3. Bibasilar atelectasis/infiltrates again noted. Small right pleural effusion again noted. No left pleural effusion noted on today's exam.  Electronically Signed   By: Marcello Moores  Register   On: 02/13/2020 08:19   CT Head Wo Contrast  Result Date: 02/13/2020 CLINICAL DATA:  Dysphagia EXAM: CT HEAD WITHOUT CONTRAST TECHNIQUE: Contiguous axial images were obtained from the base of the skull through the vertex without intravenous contrast. COMPARISON:  November 28, 2019 FINDINGS: Brain: There is stable mild diffuse atrophy. There is no intracranial mass, hemorrhage, extra-axial fluid collection, or midline shift. Slight small vessel disease in the centra semiovale bilaterally is stable. No acute infarct is demonstrable on this study. Vascular: No hyperdense vessel. There are foci of calcification in the carotid siphon regions bilaterally. Skull: The bony  calvarium appears intact. Sinuses/Orbits: Visualized paranasal sinuses are clear. Visualized orbits appear symmetric bilaterally. Other: Mastoid air cells are clear. IMPRESSION: Atrophy with slight periventricular small vessel disease. No acute infarct. No mass or hemorrhage. There are foci of arterial vascular calcification. Electronically Signed   By: Lowella Grip III M.D.   On: 02/13/2020 08:46   CT Soft Tissue Neck Wo Contrast  Result Date: 02/13/2020 CLINICAL DATA:  Lymphadenopathy, neck. History of throat cancer, difficulty swallowing. Abdominal and throat pain. EXAM: CT NECK WITHOUT CONTRAST TECHNIQUE: Multidetector CT imaging of the neck was performed following the standard protocol without intravenous contrast. COMPARISON:  No pertinent prior exams are available for comparison. FINDINGS: Pharynx and larynx: Prior total laryngectomy. Please note there is limited assessment for residual/recurrent malignancy on this non-contrast examination. Within this limitation, no definite discrete mass is identified within the oral cavity or pharynx. There is prominent debris within the oropharynx and upper esophagus which may reflect a food bolus. There is associated mild dilation of the upper esophagus. The  esophagus becomes decompressed at the C7 level. Salivary glands: There are a few nonspecific calcifications within the parotid glands bilaterally. The submandibular glands are unremarkable. Thyroid: The right thyroid lobe is poorly delineated and may be surgically absent. There are subcentimeter nodules within the left thyroid lobe not meeting consensus criteria for ultrasound follow-up. Lymph nodes: Lack of intravenous contrast administration limits evaluation for cervical lymphadenopathy. Within this limitation, no pathologically enlarged or suspicious cervical chain lymph nodes are identified. Vascular: There is limited evaluation of the major vascular structures of the neck on this noncontrast examination. Atherosclerotic calcifications within the visualized aortic arch, proximal major branch vessels of the neck and carotid arteries. Limited intracranial: No acute intracranial abnormality is identified. Visualized orbits: Visualized orbits show no acute finding. Mastoids and visualized paranasal sinuses: Mild left sphenoid sinus mucosal thickening. No significant mastoid effusion. Skeleton: Advanced cervical spondylosis with multilevel disc space narrowing, posterior disc osteophytes, uncovertebral and facet hypertrophy. There is fusion across the C4-C5 disc space. Also of note, there is advanced C5-C6 disc degeneration with degenerative endplate irregularity and degenerative endplate sclerosis. Prominent multilevel ventral osteophytes, most notably at C5-C6 and C6-C7. Upper chest: Partially visualized right pleural effusion, likely moderate in size. There are multifocal ground-glass opacities within the imaged lung apices measuring up to 8 mm. Other: TEP device noted. IMPRESSION: Prior total laryngectomy. Within the limitations of a non-contrast examination, no definite residual/recurrent soft tissue neck mass is identified. No pathologically enlarged cervical chain lymph nodes are identified. There is  prominent debris within the oropharynx and upper esophagus which may reflect a food bolus. There is associated mild dilation of the upper esophagus. Findings raise the possibility of an upper esophageal stricture with obstruction. An esophagram may be helpful for further evaluation. Prominent ventral osteophytes most notably at the C5-C6 and C6-C7 levels, which may exert some mass effect upon the upper esophagus. Multifocal ground-glass opacities within the imaged lung apices measuring up to 8 mm. Findings may be infectious/inflammatory in etiology. However, follow-up is recommended to exclude alternative etiologies (i.e. neoplasm). Non-contrast chest CT at 3-6 months is recommended. If nodules persist, subsequent management will be based upon the most suspicious nodule(s). This recommendation follows the consensus statement: Guidelines for Management of Incidental Pulmonary Nodules Detected on CT Images: From the Fleischner Society 2017; Radiology 2017; 284:228-243. Partially imaged right pleural effusion, likely moderate in size. Electronically Signed   By: Kellie Simmering DO   On: 02/13/2020 09:07   DG ESOPHAGUS  W SINGLE CM (SOL OR THIN BA)  Result Date: 02/14/2020 CLINICAL DATA:  Dysphagia EXAM: ESOPHAGUS/BARIUM SWALLOW/TABLET STUDY TECHNIQUE: Initial scout AP supine abdominal image obtained to insure adequate colon cleansing. Barium was introduced into the colon in a retrograde fashion and refluxed from the rectum to the cecum. Spot images of the colon followed by overhead radiographs were obtained. FLUOROSCOPY TIME:  Fluoroscopy Time:  1.4 minute Radiation Exposure Index (if provided by the fluoroscopic device): 10.5 mGy Number of Acquired Spot Images: 0 COMPARISON:  CT neck 02/13/2020 FINDINGS: Normal pharyngeal anatomy and motility. Severe focal esophageal narrowing at the level of the tracheostomy tube restricting passage of contrast and making it difficult for the patient to swallow the oral bolus. There  is no complete obstruction. Tertiary contractions of the distal half of the esophagus as can be seen with mild spasm. No definite hiatal hernia was demonstrated. IMPRESSION: Severe focal esophageal narrowing at the level of the tracheostomy tube restricting passage of contrast most consistent with a high-grade stricture, making it difficult for the patient to swallow the oral bolus. No complete obstruction. Tertiary contractions of the distal half of the esophagus as can be seen with mild spasm. Electronically Signed   By: Kathreen Devoid   On: 02/14/2020 09:49     Medications   Scheduled Meds: . amiodarone  100 mg Oral Daily  . chlorhexidine  15 mL Mouth Rinse BID  . chlorhexidine gluconate (MEDLINE KIT)  15 mL Mouth Rinse BID  . [START ON 02/17/2020] levothyroxine  25 mcg Intravenous Q0600  . mouth rinse  15 mL Mouth Rinse q12n4p  . sodium chloride flush  3 mL Intravenous Q12H   Continuous Infusions: . sodium chloride    . dextrose 25 mL/hr at 02/14/20 0044  . heparin 700 Units/hr (02/14/20 1456)       LOS: 1 day    Time spent: 30 minutes    Ezekiel Slocumb, DO [facility]  02/14/2020, 7:12 PM    If 7PM-7AM, please contact night-coverage. How to contact the [facility] Attending or Consulting provider [place] or covering provider during after hours [place], for this patient?    1. Check the care team in [facility] and look for a) attending/consulting [facility] provider listed and b) the [facility] team listed 2. Log into [email or web address] and use [place]'s universal password to access. If you do not have the password, please contact the hospital operator. 3. Locate the [facility] provider you are looking for under [facility] and page to a number that you can be directly reached. 4. If you still have difficulty reaching the provider, please page the [facility] (Director on Call) for the Hospitalists listed on amion for assistance.

## 2020-02-14 NOTE — Progress Notes (Addendum)
ANTICOAGULATION CONSULT NOTE - Initial Consult  Pharmacy Consult for Heparin Infusion Dosing  Indication: atrial fibrillation and DVT  Allergies  Allergen Reactions   Ace Inhibitors Other (See Comments)    Potassium elevated   Gabapentin Other (See Comments)    Ataxia at 100 mg.   Losartan Other (See Comments)    Hyperkalemia    Patient Measurements: Height: 5\' 6"  (167.6 cm) Weight: 58 kg (127 lb 14.4 oz) IBW/kg (Calculated) : 63.8  Heparin dosing wt 57.7kg   Vital Signs: Temp: 98.4 F (36.9 C) (09/10 1119) Temp Source: Oral (09/10 1119) BP: 107/71 (09/10 1119) Pulse Rate: 84 (09/10 1119)  Labs: Recent Labs    02/12/20 1921 02/12/20 2133 02/13/20 1007 02/13/20 1009 02/13/20 1009 02/14/20 0103 02/14/20 0301 02/14/20 1312  HGB 13.0  --   --   --   --  13.2  --   --   HCT 37.4*  --   --   --   --  36.0*  --   --   PLT 153  --   --   --   --  123*  --   --   APTT  --   --   --  51*   < > >160* 117* 105*  LABPROT  --   --   --  20.4*  --   --   --   --   INR  --   --   --  1.8*  --   --   --   --   HEPARINUNFRC  --   --   --  >3.60*  --   --   --   --   CREATININE 5.98*  --  7.00*  --   --  4.44*  --   --   TROPONINIHS 47* 52*  --   --   --   --   --   --    < > = values in this interval not displayed.    Estimated Creatinine Clearance: 10.2 mL/min (A) (by C-G formula based on SCr of 4.44 mg/dL (H)).   Medical History: Past Medical History:  Diagnosis Date   Cancer ([place])    CHF (congestive heart failure) (HCC)    COPD (chronic obstructive pulmonary disease) ([place])    Diabetes mellitus without complication ([place])    Hypertension    Renal disorder     Assessment: 86 yo male with PMH of RUE DVT and A. Fib. Patient taking Apixaban (Eliquis) PTA, but presents with difficulty swallowing, nausea vomiting. Pharmacy consulted for heparin infusion dosing and monitoring. Patient reports last dose of apixaban 9/8 AM, but he threw up shortly after. Baseline  aPTT (51), INR (1.8), and anti-Xa (>3.6) level elevated.   09/10 @ 0300 aPTT 117 seconds supratherapeutic  Goal of Therapy:  Heparin level 0.3-0.7 units/ml once anti-Xa and aPTT correlate aPTT 66-102 seconds Monitor platelets by anticoagulation protocol: Yes   Plan:  09/10 @1312  aPTT 105 seconds supratherapeutic. Will reduce rate from 800 units/hr to 700 units/hr and will recheck aPTT at 2200 and anti-Xa level and CBC with AM labs. Once anti-Xa level and aPTTs correlate, switch to anti-Xa level monitoring.  Sherilyn Banker, PharmD Pharmacy Resident  02/14/2020 2:37 PM

## 2020-02-14 NOTE — Progress Notes (Signed)
[facility] Cardiology  Patient Description: [name] is a 86 year old with a PMH significant for chronic systolic CHF (EF= 97-67% from 11/30/19 Echo), paroxysmal atrial fibrillation (on eliquis), CAD, h/o NSTEMI, h/o cardiac arrest,  DVT of the RUJ, laryngeal carcinoma, s/p tracheostomy with stoma on trach collar, ESRD (dialysis TThS), HTN, HLD, DM Type II, COPD, chronic hypoxic respiratory failure (on 3L of supplement O2), anemia and OSA who was admitted c/o N/V and dysphagia. The patient was found to have possible upper esophageal stricture with obstruction and an EGD is recommended; however due to patient's extensive cardiac history. Cardiology was consulted for preoperative clearance.   SUBJECTIVE: 02/13/20: The patient is unable to verbally speak but used nonverbal communication to answer most questions. The patient's wife is at the bedside as well and some of the information was obtained from her. The patient reports to have dyspnea that is worse beyond his baseline which has been present since the onset of the dysphagia. The patient denies all chest pain, palpitations, peripheral edema, syncope or lightheadedness at this time. He reports that he was given his amiodarone this morning, however, he was unable to take all of his other medications while he was home due to the dysphagia.   02/14/20: (Patient is able to whisper and mouth out words on today). The patient reports to be doing "okay." The patient reports that his dyspnea has slightly improved and he continues to deny having any chest pain, palpitations, peripheral edema, syncope or dizziness at this time. The patient does request to be placed in another room with less windows. I communicated the limited availability of hospital rooms due to the high patient census; however, I instructed the patient that I would make the nursing staff aware of his request.   OBJECTIVE:  02/13/20: the patient appears chronically ill but is not in any acute distress. He is  on 2 L of supplemental osygen via trach mask and his oxygen saturation is at 96%. The patient's telemetry monitor reveal sinus arrhythmia with occasional PVC's. He is normotensive and appears euvolemic  02/14/20: The patient continues to appear chronically ill, however he appears to be slightly improved since yesterday. The patient is requesting comfortably in bed with rails up, on the monitor, on supplement O2 via trach mask, his vss and he is in no apparent distress. He continues to appear euvolemic.    Vitals:   02/14/20 0055 02/14/20 0100 02/14/20 0808 02/14/20 1119  BP:   108/66 107/71  Pulse:   100 84  Resp:   18 19  Temp:   98.2 F (36.8 C) 98.4 F (36.9 C)  TempSrc:   Oral Oral  SpO2: 98%  100% 98%  Weight:  58 kg    Height:  5\' 6"  (1.676 m)       Intake/Output Summary (Last 24 hours) at 02/14/2020 1451 Last data filed at 02/14/2020 1029 Gross per 24 hour  Intake 437.93 ml  Output 0 ml  Net 437.93 ml      PHYSICAL EXAM General: chronically ill, adequately nourished, in no acute distress HEENT:  Normocephalic and atraumatic. PERRLA Neck:   No JVD. Tracheostomy in place.  Lungs: Clear bilaterally to auscultation, normal effort of breathing, chest expansion symmetrical  Heart: HRRR . Normal S1 and S2 without gallops or murmurs.  Abdomen: Bowel sounds are positive, abdomen soft and non-tender  Msk:  Diminished strength for age. Extremities: No clubbing, cyanosis or edema. Left AV fistula that is positive for bruit and thrill.  Neuro: Alert and oriented X 3. Psych:  Good affect, responds appropriately (nonverbally due to tracheostomy)   LABS: Basic Metabolic Panel: Recent Labs    02/13/20 1007 02/14/20 0103  NA 141 139  K 4.5 3.7  CL 98 97*  CO2 25 29  GLUCOSE 88 119*  BUN 54* 25*  CREATININE 7.00* 4.44*  CALCIUM 9.1 8.6*   Liver Function Tests: Recent Labs    02/12/20 1921  AST 19  ALT 21  ALKPHOS 54  BILITOT 1.4*  PROT 7.6  ALBUMIN 4.3   Recent  Labs    02/12/20 1921  LIPASE 26   CBC: Recent Labs    02/12/20 1921 02/14/20 0103  WBC 5.8 4.6  HGB 13.0 13.2  HCT 37.4* 36.0*  MCV 94.4 90.7  PLT 153 123*   Cardiac Enzymes: No results for input(s): CKTOTAL, CKMB, CKMBINDEX, TROPONINI in the last 72 hours. BNP: Invalid input(s): POCBNP D-Dimer: No results for input(s): DDIMER in the last 72 hours. Hemoglobin A1C: No results for input(s): HGBA1C in the last 72 hours. Fasting Lipid Panel: No results for input(s): CHOL, HDL, LDLCALC, TRIG, CHOLHDL, LDLDIRECT in the last 72 hours. Thyroid Function Tests: No results for input(s): TSH, T4TOTAL, T3FREE, THYROIDAB in the last 72 hours.  Invalid input(s): FREET3 Anemia Panel: No results for input(s): VITAMINB12, FOLATE, FERRITIN, TIBC, IRON, RETICCTPCT in the last 72 hours.  CT ABDOMEN PELVIS WO CONTRAST  Result Date: 02/13/2020 CLINICAL DATA:  Vomiting, hypoglycemia, abdominal pain, question bowel obstruction, dialysis patient EXAM: CT ABDOMEN AND PELVIS WITHOUT CONTRAST TECHNIQUE: Multidetector CT imaging of the abdomen and pelvis was performed following the standard protocol without IV contrast. Sagittal and coronal MPR images reconstructed from axial data set. Oral contrast was not administered. COMPARISON:  12/06/2019 FINDINGS: Lower chest: Small to moderate RIGHT pleural effusion, slightly increased. Resolution of previously seen LEFT pleural effusion. Compressive atelectasis RIGHT lower lobe. Enlargement of cardiac chambers. Hepatobiliary: High attenuation material within gallbladder increased from previous exam, could represent layered calculi, milk of calcium or vicarious excretion of contrast if recently administered. No definite focal hepatic abnormality. Pancreas: Normal appearance Spleen: Normal appearance Adrenals/Urinary Tract: Thickening of adrenal glands without discrete mass. Atrophic kidneys consistent with history of dialysis. Questionable tiny hyperdense cyst 4 mm  diameter at mid RIGHT kidney. No additional renal mass or hydronephrosis. Ureters and bladder decompressed. Stomach/Bowel: Mild rectal wall thickening. Appendix not visualized. Gaseous distention of sigmoid loop extending cranially to the LEFT lobe of the liver, 6.8 cm transverse. Remainder of colon decompressed. Stomach decompressed. No bowel dilatation or evidence of obstruction. Mild sigmoid diverticulosis incidentally noted without evidence of diverticulitis. Vascular/Lymphatic: Extensive atherosclerotic calcifications aorta, iliac arteries, and coronary arteries. Significant calcified plaque at the origins of the celiac, superior mesenteric, and BILATERAL renal arteries. Aorta normal caliber. No adenopathy. Reproductive: Mild prostatic enlargement. Other: Small amount of free fluid in pelvis and LEFT mid abdomen. No free air. No hernia. Musculoskeletal: Osseous demineralization. Multilevel degenerative disc disease changes thoracolumbar spine. Chronic superior endplate concavity L1 unchanged. Degenerative changes of both hip joints. IMPRESSION: Gaseous distention of sigmoid loop extending cranially to the LEFT lobe of the liver, 6.8 cm transverse without wall thickening or obstruction. Mild rectal wall thickening question proctitis; recommend correlation with proctoscopy. Small amount of nonspecific free fluid in pelvis and LEFT mid abdomen. Small to moderate RIGHT pleural effusion, slightly increased. High attenuation material within gallbladder increased from previous exam, could represent layered calculi, milk of calcium or vicarious excretion of contrast if recently administered.  Mild prostatic enlargement. Aortic Atherosclerosis (ICD10-I70.0). Electronically Signed   By: Lavonia Dana M.D.   On: 02/13/2020 08:49   DG Chest 2 View  Result Date: 02/13/2020 CLINICAL DATA:  Vomiting.  Difficulty swallowing. EXAM: CHEST - 2 VIEW COMPARISON:  CT 12/06/2019.  Chest x-ray 12/02/2019. FINDINGS: Interim removal of  tracheostomy tube. Stable cardiomegaly. Bibasilar atelectasis/infiltrates again noted. Small right pleural effusion again noted. No left pleural effusion noted on today's exam. No pneumothorax. Surgical clips right neck. IMPRESSION: 1.  Interim removal of tracheostomy tube. 2.  Stable cardiomegaly. 3. Bibasilar atelectasis/infiltrates again noted. Small right pleural effusion again noted. No left pleural effusion noted on today's exam. Electronically Signed   By: Marcello Moores  Register   On: 02/13/2020 08:19   CT Head Wo Contrast  Result Date: 02/13/2020 CLINICAL DATA:  Dysphagia EXAM: CT HEAD WITHOUT CONTRAST TECHNIQUE: Contiguous axial images were obtained from the base of the skull through the vertex without intravenous contrast. COMPARISON:  November 28, 2019 FINDINGS: Brain: There is stable mild diffuse atrophy. There is no intracranial mass, hemorrhage, extra-axial fluid collection, or midline shift. Slight small vessel disease in the centra semiovale bilaterally is stable. No acute infarct is demonstrable on this study. Vascular: No hyperdense vessel. There are foci of calcification in the carotid siphon regions bilaterally. Skull: The bony calvarium appears intact. Sinuses/Orbits: Visualized paranasal sinuses are clear. Visualized orbits appear symmetric bilaterally. Other: Mastoid air cells are clear. IMPRESSION: Atrophy with slight periventricular small vessel disease. No acute infarct. No mass or hemorrhage. There are foci of arterial vascular calcification. Electronically Signed   By: Lowella Grip III M.D.   On: 02/13/2020 08:46   CT Soft Tissue Neck Wo Contrast  Result Date: 02/13/2020 CLINICAL DATA:  Lymphadenopathy, neck. History of throat cancer, difficulty swallowing. Abdominal and throat pain. EXAM: CT NECK WITHOUT CONTRAST TECHNIQUE: Multidetector CT imaging of the neck was performed following the standard protocol without intravenous contrast. COMPARISON:  No pertinent prior exams are available  for comparison. FINDINGS: Pharynx and larynx: Prior total laryngectomy. Please note there is limited assessment for residual/recurrent malignancy on this non-contrast examination. Within this limitation, no definite discrete mass is identified within the oral cavity or pharynx. There is prominent debris within the oropharynx and upper esophagus which may reflect a food bolus. There is associated mild dilation of the upper esophagus. The esophagus becomes decompressed at the C7 level. Salivary glands: There are a few nonspecific calcifications within the parotid glands bilaterally. The submandibular glands are unremarkable. Thyroid: The right thyroid lobe is poorly delineated and may be surgically absent. There are subcentimeter nodules within the left thyroid lobe not meeting consensus criteria for ultrasound follow-up. Lymph nodes: Lack of intravenous contrast administration limits evaluation for cervical lymphadenopathy. Within this limitation, no pathologically enlarged or suspicious cervical chain lymph nodes are identified. Vascular: There is limited evaluation of the major vascular structures of the neck on this noncontrast examination. Atherosclerotic calcifications within the visualized aortic arch, proximal major branch vessels of the neck and carotid arteries. Limited intracranial: No acute intracranial abnormality is identified. Visualized orbits: Visualized orbits show no acute finding. Mastoids and visualized paranasal sinuses: Mild left sphenoid sinus mucosal thickening. No significant mastoid effusion. Skeleton: Advanced cervical spondylosis with multilevel disc space narrowing, posterior disc osteophytes, uncovertebral and facet hypertrophy. There is fusion across the C4-C5 disc space. Also of note, there is advanced C5-C6 disc degeneration with degenerative endplate irregularity and degenerative endplate sclerosis. Prominent multilevel ventral osteophytes, most notably at C5-C6 and C6-C7. Upper  chest: Partially visualized right pleural effusion, likely moderate in size. There are multifocal ground-glass opacities within the imaged lung apices measuring up to 8 mm. Other: TEP device noted. IMPRESSION: Prior total laryngectomy. Within the limitations of a non-contrast examination, no definite residual/recurrent soft tissue neck mass is identified. No pathologically enlarged cervical chain lymph nodes are identified. There is prominent debris within the oropharynx and upper esophagus which may reflect a food bolus. There is associated mild dilation of the upper esophagus. Findings raise the possibility of an upper esophageal stricture with obstruction. An esophagram may be helpful for further evaluation. Prominent ventral osteophytes most notably at the C5-C6 and C6-C7 levels, which may exert some mass effect upon the upper esophagus. Multifocal ground-glass opacities within the imaged lung apices measuring up to 8 mm. Findings may be infectious/inflammatory in etiology. However, follow-up is recommended to exclude alternative etiologies (i.e. neoplasm). Non-contrast chest CT at 3-6 months is recommended. If nodules persist, subsequent management will be based upon the most suspicious nodule(s). This recommendation follows the consensus statement: Guidelines for Management of Incidental Pulmonary Nodules Detected on CT Images: From the Fleischner Society 2017; Radiology 2017; 284:228-243. Partially imaged right pleural effusion, likely moderate in size. Electronically Signed   By: Kellie Simmering DO   On: 02/13/2020 09:07   DG ESOPHAGUS W SINGLE CM (SOL OR THIN BA)  Result Date: 02/14/2020 CLINICAL DATA:  Dysphagia EXAM: ESOPHAGUS/BARIUM SWALLOW/TABLET STUDY TECHNIQUE: Initial scout AP supine abdominal image obtained to insure adequate colon cleansing. Barium was introduced into the colon in a retrograde fashion and refluxed from the rectum to the cecum. Spot images of the colon followed by overhead  radiographs were obtained. FLUOROSCOPY TIME:  Fluoroscopy Time:  1.4 minute Radiation Exposure Index (if provided by the fluoroscopic device): 10.5 mGy Number of Acquired Spot Images: 0 COMPARISON:  CT neck 02/13/2020 FINDINGS: Normal pharyngeal anatomy and motility. Severe focal esophageal narrowing at the level of the tracheostomy tube restricting passage of contrast and making it difficult for the patient to swallow the oral bolus. There is no complete obstruction. Tertiary contractions of the distal half of the esophagus as can be seen with mild spasm. No definite hiatal hernia was demonstrated. IMPRESSION: Severe focal esophageal narrowing at the level of the tracheostomy tube restricting passage of contrast most consistent with a high-grade stricture, making it difficult for the patient to swallow the oral bolus. No complete obstruction. Tertiary contractions of the distal half of the esophagus as can be seen with mild spasm. Electronically Signed   By: Kathreen Devoid   On: 02/14/2020 09:49     Echo: (11/2019) IMPRESSIONS  1. Left ventricular ejection fraction, by estimation, is 25 to 30%. Left  ventricular ejection fraction by PLAX is 26 %. The left ventricle has  severely decreased function. The left ventricle has no regional wall  motion abnormalities. The left  ventricular internal cavity size was moderately dilated. Left ventricular  diastolic parameters are consistent with Grade I diastolic dysfunction  (impaired relaxation).  2. Right ventricular systolic function is normal. The right ventricular  size is mildly enlarged.  3. The mitral valve was not well visualized. Trivial mitral valve  regurgitation.  4. The aortic valve was not well visualized. Aortic valve regurgitation  is trivial. Mild aortic valve sclerosis is present, with no evidence of  aortic valve stenosis.   TELEMETRY: SA with HR of 93 bpm.   ASSESSMENT AND PLAN:  Principal Problem:   Difficulty  swallowing Active Problems:   Anemia  of chronic disease   Coronary artery disease   Hyperlipidemia   Hypertension   Squamous cell cancer of hypopharynx (HCC)   Acute deep vein thrombosis (DVT) of right upper extremity (HCC)   AF (paroxysmal atrial fibrillation) (HCC)   Chronic systolic CHF (congestive heart failure) (HCC)   ESRD (end stage renal disease) (HCC)   Hypoglycemia   COPD (chronic obstructive pulmonary disease) ([place])   Type II diabetes mellitus with renal manifestations ([place])    PLAN:  1. Paroxsymal atrial fibrillation, reasonably controlled, rate controlled             -If the patient is unable to take the oral Amiodarone, then we will consider starting an Amiodarone IV gtt without bolus for rhythm management.              -Agree with holding eliquis at this time for planned GI procedure.   2. CAD, reasonably stable             -High sensitivity troponin=47>>52; unremarkable             -We will continue statin therapy.              -Patient is a poor beta blocker candidate.    3. Chronic systolic CHF (FV=49-44%), compensated, the patient appears euvolemic  -Recommend obtaining BNP.              -Recommend daily weights and strict I&O's.             -Recommend GENTLE hydration with IV fluids if necessary.              -We will not repeat Echo at this time due to most recent being 11/2019  4. Dysphagia, unstable              -GI consult appreciated              -Possible EGD.             -Patient is cleared from a Cardiology standpoint as an acceptable risk for EGD procedure. Patient is medically optimized for the management of a-fib.              -agree with holding eliquis at this time.             -agree with NPO.   5. HTN with recent Hypotension, fairly stable             -Recommend restarting midodrine at his home dosage.              -Vitals q4h.   6. Anemia, stable, hemoglobin level is at 13.2 (from 02/14/20)             -recommend trending  CBC.  8. HLD, stable             -We will continue management with lipitor  9. Acute DVT of the RUE, fairly stable             -Agree with holding eliquis at this time and continuing IV heparin.  10. ESRD, on dialysis, fairly stable (BUN=54 (H), creat=7 (H) from 02/13/20)             -Nephrology consult appreciated.   11. COPD with chronic hypoxic respiratory failure, fairly stable patient's O2 saturation is at 96% on 2L of O2 via trach mask.              -Recommend continuing supplement oxygen at 2-3L per home management.  12. DM Type II with recent episodes of hypoglycemia, stable at this time             -agree with holding long acting insulin and glipizide at this time due to hypoglycemia. (glucose=119 from 02/14/20)              -recommend CBG monitoring every 4 hours.   13. Laryngeal carcinoma s/p tracheostomy with stoma (on trach collar), stable             -agree with Oncology management as outpatient   [place], [id number]-AG  02/14/2020 2:51 PM

## 2020-02-14 NOTE — Progress Notes (Signed)
[place], [place] 02/14/20  Subjective:   Hospital day # 1  Kerry [name] is a 86 y.o. male with medical history significant of laryngeal carcinoma, s/p of tracheostomy with stoma on trach collar, ESRD on TTS schedule, last dialysis on Tuesday, HTN, HLD, DM, COPD, on 2-3L of oxygen at baseline, CHF with EF 20-30%, RUE DVT, A fib,  CAD, presents with dysphagia, nausea and vomiting.  Doing fair. Barium swallow study shows esophageal stricture GI evaluation is in progress    Objective:  Vital signs in last 24 hours:  Temp:  [98.1 F (36.7 C)-98.2 F (36.8 C)] 98.2 F (36.8 C) (09/10 0808) Pulse Rate:  [73-100] 100 (09/10 0808) Resp:  [16-23] 18 (09/10 0808) BP: (107-151)/(66-89) 108/66 (09/10 0808) SpO2:  [95 %-100 %] 100 % (09/10 0808) Weight:  [57.7 kg-58 kg] 58 kg (09/10 0100)  Weight change:  Filed Weights   02/13/20 1703 02/14/20 0100  Weight: 57.7 kg 58 kg    Intake/Output:    Intake/Output Summary (Last 24 hours) at 02/14/2020 1106 Last data filed at 02/14/2020 1029 Gross per 24 hour  Intake 437.93 ml  Output 0 ml  Net 437.93 ml     Physical Exam: General: Alert, In no acute distress  HEENT Normocephalic,Atraumatic,Trach collar on   Pulm/lungs Fine crackles bilaterally  CVS/Heart S1S2, Irregular rhythm  Abdomen:  Non distended,non tender  Extremities: No peripheral edema  Neurologic: Alert, oriented  Skin: No acute lesions or rashes  Access: Lt AV graft      Basic Metabolic Panel:  Recent Labs  Lab 02/12/20 1921 02/13/20 1007 02/14/20 0103  NA 142 141 139  K 4.3 4.5 3.7  CL 99 98 97*  CO2 26 25 29   GLUCOSE 55* 88 119*  BUN 45* 54* 25*  CREATININE 5.98* 7.00* 4.44*  CALCIUM 9.0 9.1 8.6*     CBC: Recent Labs  Lab 02/12/20 1921 02/14/20 0103  WBC 5.8 4.6  HGB 13.0 13.2  HCT 37.4* 36.0*  MCV 94.4 90.7  PLT 153 123*      Lab Results  Component Value Date   HEPBSAG NON REACTIVE 11/30/2019       Microbiology:  Recent Results (from the past 240 hour(s))  SARS Coronavirus 2 by RT PCR (hospital order, performed in [place] hospital lab) Nasopharyngeal Nasopharyngeal Swab     Status: None   Collection Time: 02/13/20  7:53 AM   Specimen: Nasopharyngeal Swab  Result Value Ref Range Status   SARS Coronavirus 2 NEGATIVE NEGATIVE Final    Comment: (NOTE) SARS-CoV-2 target nucleic acids are NOT DETECTED.  The SARS-CoV-2 RNA is generally detectable in upper and lower respiratory specimens during the acute phase of infection. The lowest concentration of SARS-CoV-2 viral copies this assay can detect is 250 copies / mL. A negative result does not preclude SARS-CoV-2 infection and should not be used as the sole basis for treatment or other patient management decisions.  A negative result may occur with improper specimen collection / handling, submission of specimen other than nasopharyngeal swab, presence of viral mutation(s) within the areas targeted by this assay, and inadequate number of viral copies (<250 copies / mL). A negative result must be combined with clinical observations, patient history, and epidemiological information.  Fact Sheet for Patients:   StrictlyIdeas.no  Fact Sheet for Healthcare Providers: BankingDealers.co.za  This test is not yet approved or  cleared by the [place] FDA and has been authorized for detection and/or diagnosis of SARS-CoV-2 by  FDA under an Emergency Use Authorization (EUA).  This EUA will remain in effect (meaning this test can be used) for the duration of the COVID-19 declaration under Section 564(b)(1) of the Act, 21 U.S.C. section 360bbb-3(b)(1), unless the authorization is terminated or revoked sooner.  Performed at [facility], [address], [place], [place] [address]   Surgical PCR screen     Status: None   Collection Time: 02/14/20  1:21 AM   Specimen:  Nasal Mucosa; Nasal Swab  Result Value Ref Range Status   MRSA, PCR NEGATIVE NEGATIVE Final   Staphylococcus aureus NEGATIVE NEGATIVE Final    Comment: (NOTE) The Xpert SA Assay (FDA approved for NASAL specimens in patients 88 years of age and older), is one component of a comprehensive surveillance program. It is not intended to diagnose infection nor to guide or monitor treatment. Performed at [facility], [address] [place],  [address]     Coagulation Studies: Recent Labs    02/13/20 1009  LABPROT 20.4*  INR 1.8*    Urinalysis: No results for input(s): COLORURINE, LABSPEC, PHURINE, GLUCOSEU, HGBUR, BILIRUBINUR, KETONESUR, PROTEINUR, UROBILINOGEN, NITRITE, LEUKOCYTESUR in the last 72 hours.  Invalid input(s): APPERANCEUR    Imaging: CT ABDOMEN PELVIS WO CONTRAST  Result Date: 02/13/2020 CLINICAL DATA:  Vomiting, hypoglycemia, abdominal pain, question bowel obstruction, dialysis patient EXAM: CT ABDOMEN AND PELVIS WITHOUT CONTRAST TECHNIQUE: Multidetector CT imaging of the abdomen and pelvis was performed following the standard protocol without IV contrast. Sagittal and coronal MPR images reconstructed from axial data set. Oral contrast was not administered. COMPARISON:  12/06/2019 FINDINGS: Lower chest: Small to moderate RIGHT pleural effusion, slightly increased. Resolution of previously seen LEFT pleural effusion. Compressive atelectasis RIGHT lower lobe. Enlargement of cardiac chambers. Hepatobiliary: High attenuation material within gallbladder increased from previous exam, could represent layered calculi, milk of calcium or vicarious excretion of contrast if recently administered. No definite focal hepatic abnormality. Pancreas: Normal appearance Spleen: Normal appearance Adrenals/Urinary Tract: Thickening of adrenal glands without discrete mass. Atrophic kidneys consistent with history of dialysis. Questionable tiny hyperdense cyst 4 mm diameter at mid  RIGHT kidney. No additional renal mass or hydronephrosis. Ureters and bladder decompressed. Stomach/Bowel: Mild rectal wall thickening. Appendix not visualized. Gaseous distention of sigmoid loop extending cranially to the LEFT lobe of the liver, 6.8 cm transverse. Remainder of colon decompressed. Stomach decompressed. No bowel dilatation or evidence of obstruction. Mild sigmoid diverticulosis incidentally noted without evidence of diverticulitis. Vascular/Lymphatic: Extensive atherosclerotic calcifications aorta, iliac arteries, and coronary arteries. Significant calcified plaque at the origins of the celiac, superior mesenteric, and BILATERAL renal arteries. Aorta normal caliber. No adenopathy. Reproductive: Mild prostatic enlargement. Other: Small amount of free fluid in pelvis and LEFT mid abdomen. No free air. No hernia. Musculoskeletal: Osseous demineralization. Multilevel degenerative disc disease changes thoracolumbar spine. Chronic superior endplate concavity L1 unchanged. Degenerative changes of both hip joints. IMPRESSION: Gaseous distention of sigmoid loop extending cranially to the LEFT lobe of the liver, 6.8 cm transverse without wall thickening or obstruction. Mild rectal wall thickening question proctitis; recommend correlation with proctoscopy. Small amount of nonspecific free fluid in pelvis and LEFT mid abdomen. Small to moderate RIGHT pleural effusion, slightly increased. High attenuation material within gallbladder increased from previous exam, could represent layered calculi, milk of calcium or vicarious excretion of contrast if recently administered. Mild prostatic enlargement. Aortic Atherosclerosis (ICD10-I70.0). Electronically Signed   By: Lavonia Dana M.D.   On: 02/13/2020 08:49   DG Chest 2 View  Result Date: 02/13/2020 CLINICAL DATA:  Vomiting.  Difficulty swallowing. EXAM: CHEST - 2 VIEW COMPARISON:  CT 12/06/2019.  Chest x-ray 12/02/2019. FINDINGS: Interim removal of tracheostomy  tube. Stable cardiomegaly. Bibasilar atelectasis/infiltrates again noted. Small right pleural effusion again noted. No left pleural effusion noted on today's exam. No pneumothorax. Surgical clips right neck. IMPRESSION: 1.  Interim removal of tracheostomy tube. 2.  Stable cardiomegaly. 3. Bibasilar atelectasis/infiltrates again noted. Small right pleural effusion again noted. No left pleural effusion noted on today's exam. Electronically Signed   By: Marcello Moores  Register   On: 02/13/2020 08:19   CT Head Wo Contrast  Result Date: 02/13/2020 CLINICAL DATA:  Dysphagia EXAM: CT HEAD WITHOUT CONTRAST TECHNIQUE: Contiguous axial images were obtained from the base of the skull through the vertex without intravenous contrast. COMPARISON:  November 28, 2019 FINDINGS: Brain: There is stable mild diffuse atrophy. There is no intracranial mass, hemorrhage, extra-axial fluid collection, or midline shift. Slight small vessel disease in the centra semiovale bilaterally is stable. No acute infarct is demonstrable on this study. Vascular: No hyperdense vessel. There are foci of calcification in the carotid siphon regions bilaterally. Skull: The bony calvarium appears intact. Sinuses/Orbits: Visualized paranasal sinuses are clear. Visualized orbits appear symmetric bilaterally. Other: Mastoid air cells are clear. IMPRESSION: Atrophy with slight periventricular small vessel disease. No acute infarct. No mass or hemorrhage. There are foci of arterial vascular calcification. Electronically Signed   By: Lowella Grip III M.D.   On: 02/13/2020 08:46   CT Soft Tissue Neck Wo Contrast  Result Date: 02/13/2020 CLINICAL DATA:  Lymphadenopathy, neck. History of throat cancer, difficulty swallowing. Abdominal and throat pain. EXAM: CT NECK WITHOUT CONTRAST TECHNIQUE: Multidetector CT imaging of the neck was performed following the standard protocol without intravenous contrast. COMPARISON:  No pertinent prior exams are available for  comparison. FINDINGS: Pharynx and larynx: Prior total laryngectomy. Please note there is limited assessment for residual/recurrent malignancy on this non-contrast examination. Within this limitation, no definite discrete mass is identified within the oral cavity or pharynx. There is prominent debris within the oropharynx and upper esophagus which may reflect a food bolus. There is associated mild dilation of the upper esophagus. The esophagus becomes decompressed at the C7 level. Salivary glands: There are a few nonspecific calcifications within the parotid glands bilaterally. The submandibular glands are unremarkable. Thyroid: The right thyroid lobe is poorly delineated and may be surgically absent. There are subcentimeter nodules within the left thyroid lobe not meeting consensus criteria for ultrasound follow-up. Lymph nodes: Lack of intravenous contrast administration limits evaluation for cervical lymphadenopathy. Within this limitation, no pathologically enlarged or suspicious cervical chain lymph nodes are identified. Vascular: There is limited evaluation of the major vascular structures of the neck on this noncontrast examination. Atherosclerotic calcifications within the visualized aortic arch, proximal major branch vessels of the neck and carotid arteries. Limited intracranial: No acute intracranial abnormality is identified. Visualized orbits: Visualized orbits show no acute finding. Mastoids and visualized paranasal sinuses: Mild left sphenoid sinus mucosal thickening. No significant mastoid effusion. Skeleton: Advanced cervical spondylosis with multilevel disc space narrowing, posterior disc osteophytes, uncovertebral and facet hypertrophy. There is fusion across the C4-C5 disc space. Also of note, there is advanced C5-C6 disc degeneration with degenerative endplate irregularity and degenerative endplate sclerosis. Prominent multilevel ventral osteophytes, most notably at C5-C6 and C6-C7. Upper chest:  Partially visualized right pleural effusion, likely moderate in size. There are multifocal ground-glass opacities within the imaged lung apices measuring up to 8 mm. Other:  TEP device noted. IMPRESSION: Prior total laryngectomy. Within the limitations of a non-contrast examination, no definite residual/recurrent soft tissue neck mass is identified. No pathologically enlarged cervical chain lymph nodes are identified. There is prominent debris within the oropharynx and upper esophagus which may reflect a food bolus. There is associated mild dilation of the upper esophagus. Findings raise the possibility of an upper esophageal stricture with obstruction. An esophagram may be helpful for further evaluation. Prominent ventral osteophytes most notably at the C5-C6 and C6-C7 levels, which may exert some mass effect upon the upper esophagus. Multifocal ground-glass opacities within the imaged lung apices measuring up to 8 mm. Findings may be infectious/inflammatory in etiology. However, follow-up is recommended to exclude alternative etiologies (i.e. neoplasm). Non-contrast chest CT at 3-6 months is recommended. If nodules persist, subsequent management will be based upon the most suspicious nodule(s). This recommendation follows the consensus statement: Guidelines for Management of Incidental Pulmonary Nodules Detected on CT Images: From the Fleischner Society 2017; Radiology 2017; 284:228-243. Partially imaged right pleural effusion, likely moderate in size. Electronically Signed   By: Kellie Simmering DO   On: 02/13/2020 09:07   DG ESOPHAGUS W SINGLE CM (SOL OR THIN BA)  Result Date: 02/14/2020 CLINICAL DATA:  Dysphagia EXAM: ESOPHAGUS/BARIUM SWALLOW/TABLET STUDY TECHNIQUE: Initial scout AP supine abdominal image obtained to insure adequate colon cleansing. Barium was introduced into the colon in a retrograde fashion and refluxed from the rectum to the cecum. Spot images of the colon followed by overhead radiographs  were obtained. FLUOROSCOPY TIME:  Fluoroscopy Time:  1.4 minute Radiation Exposure Index (if provided by the fluoroscopic device): 10.5 mGy Number of Acquired Spot Images: 0 COMPARISON:  CT neck 02/13/2020 FINDINGS: Normal pharyngeal anatomy and motility. Severe focal esophageal narrowing at the level of the tracheostomy tube restricting passage of contrast and making it difficult for the patient to swallow the oral bolus. There is no complete obstruction. Tertiary contractions of the distal half of the esophagus as can be seen with mild spasm. No definite hiatal hernia was demonstrated. IMPRESSION: Severe focal esophageal narrowing at the level of the tracheostomy tube restricting passage of contrast most consistent with a high-grade stricture, making it difficult for the patient to swallow the oral bolus. No complete obstruction. Tertiary contractions of the distal half of the esophagus as can be seen with mild spasm. Electronically Signed   By: Kathreen Devoid   On: 02/14/2020 09:49     Medications:   . sodium chloride    . dextrose 25 mL/hr at 02/14/20 0044  . heparin 800 Units/hr (02/14/20 0648)   . amiodarone  100 mg Oral Daily  . chlorhexidine gluconate (MEDLINE KIT)  15 mL Mouth Rinse BID  . levothyroxine  50 mcg Oral Daily  . sodium chloride flush  3 mL Intravenous Q12H   sodium chloride, acetaminophen **OR** acetaminophen, albuterol, dextrose, hydrALAZINE, lidocaine-prilocaine, ondansetron (ZOFRAN) IV, polyethylene glycol, sodium chloride flush  Assessment/ Plan:  86 y.o. male with     admitted on 02/13/2020 for nausea, vomiting and  Esophageal dysphagia [R13.10] Hypoglycemia [E16.2] Difficulty swallowing [R13.10] Vomiting, intractability of vomiting not specified, presence of nausea not specified, unspecified vomiting type [R11.10]  [facility], [facility] ,TTS, Lt AVG   #Dysphagia Severe focal esophageal narrowing at the level of tracheostomy consistent with high-grade stricture was noted  on barium swallow exam on September 10. Plan for esophageal dilation as per gastroenterology team Eliquis on hold for 3 days prior to procedure  #ESRD  Patient is on  TTS schedule as outpatient Plan for dialysis on Saturday Volume removal as tolerated  # Diabetes Type 2 with CKD Recent A1c 5.5, well controlled.    Patient has hypoglycemia  With low blood sugar 55 mg/dl on admission -Lantus and glipizide on hold   #Hypoglycemia -Getting D10 infusion to prevent hypoglycemia  #Anemia assessment Lab Results  Component Value Date   HGB 13.2 02/14/2020   No indication for Epogen     LOS: [facility] 9/10/202111:06 AM  [place], [place]  Note: This note was prepared with Dragon dictation. Any transcription errors are unintentional

## 2020-02-14 NOTE — Progress Notes (Signed)
ANTICOAGULATION CONSULT NOTE - Initial Consult  Pharmacy Consult for Heparin Infusion Dosing  Indication: atrial fibrillation and DVT  Allergies  Allergen Reactions  . Ace Inhibitors Other (See Comments)    Potassium elevated  . Gabapentin Other (See Comments)    Ataxia at 100 mg.  . Losartan Other (See Comments)    Hyperkalemia    Patient Measurements: Height: 5\' 6"  (167.6 cm) Weight: 58 kg (127 lb 14.4 oz) IBW/kg (Calculated) : 63.8  Heparin dosing wt 57.7kg   Vital Signs: Temp: 98.1 F (36.7 C) (09/10 0045) Temp Source: Oral (09/10 0045) BP: 107/69 (09/10 0045) Pulse Rate: 73 (09/10 0045)  Labs: Recent Labs    02/12/20 1921 02/12/20 2133 02/13/20 1007 02/13/20 1009 02/14/20 0103 02/14/20 0301  HGB 13.0  --   --   --  13.2  --   HCT 37.4*  --   --   --  36.0*  --   PLT 153  --   --   --  123*  --   APTT  --   --   --  51* >160* 117*  LABPROT  --   --   --  20.4*  --   --   INR  --   --   --  1.8*  --   --   HEPARINUNFRC  --   --   --  >3.60*  --   --   CREATININE 5.98*  --  7.00*  --  4.44*  --   TROPONINIHS 47* 52*  --   --   --   --     Estimated Creatinine Clearance: 10.2 mL/min (A) (by C-G formula based on SCr of 4.44 mg/dL (H)).   Medical History: Past Medical History:  Diagnosis Date  . Cancer ([place])   . CHF (congestive heart failure) ([place])   . COPD (chronic obstructive pulmonary disease) ([place])   . Diabetes mellitus without complication ([place])   . Hypertension   . Renal disorder     Assessment: 86 yo male with PMH of RUE DVT and A. Fib. Patient taking Apixaban (Eliquis) PTA, but presents with difficulty swallowing, nausea vomiting. Pharmacy consulted for heparin infusion dosing and monitoring. Patient reports last dose of apixaban 9/8 AM, but he threw up shortly after. Baseline aPTT, INR, and anti-Xa level elevated.   Goal of Therapy:  Heparin level 0.3-0.7 units/ml aPTT 66-102 seconds Monitor platelets by anticoagulation protocol: Yes    Plan:  09/10 @ 0300 aPTT 117 seconds supratherapeutic. Will reduce rate to 800 units/hr and will recheck aPTT at 1300 and continue to monitor.  Tobie Lords, PharmD, BCPS Clinical Pharmacist 02/14/2020 6:44 AM

## 2020-02-15 DIAGNOSIS — K222 Esophageal obstruction: Principal | ICD-10-CM

## 2020-02-15 LAB — GLUCOSE, CAPILLARY
Glucose-Capillary: 101 mg/dL — ABNORMAL HIGH (ref 70–99)
Glucose-Capillary: 107 mg/dL — ABNORMAL HIGH (ref 70–99)
Glucose-Capillary: 125 mg/dL — ABNORMAL HIGH (ref 70–99)
Glucose-Capillary: 66 mg/dL — ABNORMAL LOW (ref 70–99)
Glucose-Capillary: 78 mg/dL (ref 70–99)
Glucose-Capillary: 80 mg/dL (ref 70–99)
Glucose-Capillary: 81 mg/dL (ref 70–99)
Glucose-Capillary: 83 mg/dL (ref 70–99)
Glucose-Capillary: 95 mg/dL (ref 70–99)

## 2020-02-15 LAB — CBC
HCT: 35.3 % — ABNORMAL LOW (ref 39.0–52.0)
Hemoglobin: 12.5 g/dL — ABNORMAL LOW (ref 13.0–17.0)
MCH: 32.4 pg (ref 26.0–34.0)
MCHC: 35.4 g/dL (ref 30.0–36.0)
MCV: 91.5 fL (ref 80.0–100.0)
Platelets: 108 10*3/uL — ABNORMAL LOW (ref 150–400)
RBC: 3.86 MIL/uL — ABNORMAL LOW (ref 4.22–5.81)
RDW: 16.4 % — ABNORMAL HIGH (ref 11.5–15.5)
WBC: 3.8 10*3/uL — ABNORMAL LOW (ref 4.0–10.5)
nRBC: 0 % (ref 0.0–0.2)

## 2020-02-15 LAB — BASIC METABOLIC PANEL
Anion gap: 15 (ref 5–15)
BUN: 35 mg/dL — ABNORMAL HIGH (ref 8–23)
CO2: 23 mmol/L (ref 22–32)
Calcium: 8.5 mg/dL — ABNORMAL LOW (ref 8.9–10.3)
Chloride: 96 mmol/L — ABNORMAL LOW (ref 98–111)
Creatinine, Ser: 6.69 mg/dL — ABNORMAL HIGH (ref 0.61–1.24)
GFR calc Af Amer: 8 mL/min — ABNORMAL LOW (ref >60)
GFR calc non Af Amer: 7 mL/min — ABNORMAL LOW (ref >60)
Glucose, Bld: 73 mg/dL (ref 70–99)
Potassium: 3.9 mmol/L (ref 3.5–5.1)
Sodium: 134 mmol/L — ABNORMAL LOW (ref 135–145)

## 2020-02-15 LAB — APTT
aPTT: 59 seconds — ABNORMAL HIGH (ref 24–36)
aPTT: 62 seconds — ABNORMAL HIGH (ref 24–36)

## 2020-02-15 LAB — HEPARIN LEVEL (UNFRACTIONATED): Heparin Unfractionated: 3.12 IU/mL — ABNORMAL HIGH (ref 0.30–0.70)

## 2020-02-15 MED ORDER — POTASSIUM CHLORIDE 10 MEQ/100ML IV SOLN
10.0000 meq | Freq: Once | INTRAVENOUS | Status: AC
  Administered 2020-02-15: 10 meq via INTRAVENOUS

## 2020-02-15 MED ORDER — HEPARIN BOLUS VIA INFUSION
800.0000 [IU] | Freq: Once | INTRAVENOUS | Status: AC
  Administered 2020-02-15: 800 [IU] via INTRAVENOUS
  Filled 2020-02-15: qty 800

## 2020-02-15 MED ORDER — DEXTROSE 50 % IV SOLN
INTRAVENOUS | Status: AC
  Administered 2020-02-15: 12.5 g via INTRAVENOUS
  Filled 2020-02-15: qty 50

## 2020-02-15 MED ORDER — DEXTROSE 50 % IV SOLN
12.5000 g | INTRAVENOUS | Status: AC
  Administered 2020-02-15: 12.5 g via INTRAVENOUS

## 2020-02-15 NOTE — Progress Notes (Signed)
ANTICOAGULATION CONSULT NOTE - Initial Consult  Pharmacy Consult for Heparin Infusion Dosing  Indication: atrial fibrillation and DVT  Allergies  Allergen Reactions  . Ace Inhibitors Other (See Comments)    Potassium elevated  . Gabapentin Other (See Comments)    Ataxia at 100 mg.  . Losartan Other (See Comments)    Hyperkalemia    Patient Measurements: Height: 5\' 6"  (167.6 cm) Weight: 58 kg (127 lb 14.4 oz) IBW/kg (Calculated) : 63.8  Heparin dosing wt 57.7kg   Vital Signs: Temp: 98.2 F (36.8 C) (09/11 0813) Temp Source: Oral (09/11 0813) BP: 117/79 (09/11 0813) Pulse Rate: 74 (09/11 0813)  Labs: Recent Labs    02/12/20 1921 02/12/20 1921 02/12/20 2133 02/13/20 1007 02/13/20 1009 02/14/20 0103 02/14/20 0301 02/14/20 1312 02/14/20 2254 02/15/20 0820  HGB 13.0   < >  --   --   --  13.2  --   --   --  12.5*  HCT 37.4*  --   --   --   --  36.0*  --   --   --  35.3*  PLT 153  --   --   --   --  123*  --   --   --  108*  APTT  --    < >  --   --  51* >160*   < > 105* 128* 59*  LABPROT  --   --   --   --  20.4*  --   --   --   --   --   INR  --   --   --   --  1.8*  --   --   --   --   --   HEPARINUNFRC  --   --   --   --  >3.60*  --   --   --   --   --   CREATININE 5.98*   < >  --  7.00*  --  4.44*  --   --   --  6.69*  TROPONINIHS 47*  --  52*  --   --   --   --   --   --   --    < > = values in this interval not displayed.    Estimated Creatinine Clearance: 6.7 mL/min (A) (by C-G formula based on SCr of 6.69 mg/dL (H)).   Medical History: Past Medical History:  Diagnosis Date  . Cancer ([place])   . CHF (congestive heart failure) ([place])   . COPD (chronic obstructive pulmonary disease) ([place])   . Diabetes mellitus without complication ([place])   . Hypertension   . Renal disorder     Assessment: 86 yo male with PMH of RUE DVT and A. Fib. Patient taking Apixaban (Eliquis) PTA, but presents with difficulty swallowing, nausea vomiting. Pharmacy consulted for  heparin infusion dosing and monitoring. Patient reports last dose of apixaban 9/8 AM, but he threw up shortly after. Baseline aPTT, INR, and anti-Xa level elevated.   09/10 2300 aPTT 128 sec supratherapeutic - rate reduced to 550 units/hr 09/11 0820 aPTT 59 sec slightly subtherapeutic (heparin level still elevated from Eliquis)  Goal of Therapy:  Heparin level 0.3-0.7 units/ml aPTT 66-102 seconds Monitor platelets by anticoagulation protocol: Yes   Plan:  Slightly subtherapeutic, will bolus 800 units and increase rate to 650 units/hr   Will recheck aPTT at 1700 w/ anti-Xa check for correlation w/ am labs and will continue to monitor.  Lu Duffel, PharmD, BCPS Clinical Pharmacist 02/15/2020 10:05 AM

## 2020-02-15 NOTE — Progress Notes (Signed)
[facility] Cardiology    SUBJECTIVE: Under going dialysis (routine treatment feels reasonably well no nausea vomiting no fever chills or sweats   Vitals:   02/15/20 0730 02/15/20 0813 02/15/20 0930 02/15/20 1529  BP:  117/79  113/69  Pulse:  74  (!) 45  Resp: 18 19  18   Temp:  98.2 F (36.8 C)  98.6 F (37 C)  TempSrc:  Oral  Oral  SpO2: 100% 100% 100% 92%  Weight:      Height:         Intake/Output Summary (Last 24 hours) at 02/15/2020 1533 Last data filed at 02/15/2020 1315 Gross per 24 hour  Intake 777.43 ml  Output 1500 ml  Net -722.57 ml      PHYSICAL EXAM  General: Well developed, well nourished, in no acute distress HEENT:  Normocephalic and atramatic Neck:  No JVD.  Lungs: Clear bilaterally to auscultation and percussion. Heart: HRRR . Normal S1 and S2 without gallops or murmurs.  Abdomen: Bowel sounds are positive, abdomen soft and non-tender  Msk:  Back normal, normal gait. Normal strength and tone for age. Extremities: No clubbing, cyanosis or edema.   Neuro: Alert and oriented X 3. Psych:  Good affect, responds appropriately   LABS: Basic Metabolic Panel: Recent Labs    02/14/20 0103 02/14/20 2254 02/15/20 0820  NA 139  --  134*  K 3.7  --  3.9  CL 97*  --  96*  CO2 29  --  23  GLUCOSE 119*  --  73  BUN 25*  --  35*  CREATININE 4.44*  --  6.69*  CALCIUM 8.6*  --  8.5*  PHOS  --  4.2  --    Liver Function Tests: Recent Labs    02/12/20 1921  AST 19  ALT 21  ALKPHOS 54  BILITOT 1.4*  PROT 7.6  ALBUMIN 4.3   Recent Labs    02/12/20 1921  LIPASE 26   CBC: Recent Labs    02/14/20 0103 02/15/20 0820  WBC 4.6 3.8*  HGB 13.2 12.5*  HCT 36.0* 35.3*  MCV 90.7 91.5  PLT 123* 108*   Cardiac Enzymes: No results for input(s): CKTOTAL, CKMB, CKMBINDEX, TROPONINI in the last 72 hours. BNP: Invalid input(s): POCBNP D-Dimer: No results for input(s): DDIMER in the last 72 hours. Hemoglobin A1C: No results for input(s): HGBA1C in the last  72 hours. Fasting Lipid Panel: No results for input(s): CHOL, HDL, LDLCALC, TRIG, CHOLHDL, LDLDIRECT in the last 72 hours. Thyroid Function Tests: No results for input(s): TSH, T4TOTAL, T3FREE, THYROIDAB in the last 72 hours.  Invalid input(s): FREET3 Anemia Panel: No results for input(s): VITAMINB12, FOLATE, FERRITIN, TIBC, IRON, RETICCTPCT in the last 72 hours.  DG ESOPHAGUS W SINGLE CM (SOL OR THIN BA)  Result Date: 02/14/2020 CLINICAL DATA:  Dysphagia EXAM: ESOPHAGUS/BARIUM SWALLOW/TABLET STUDY TECHNIQUE: Initial scout AP supine abdominal image obtained to insure adequate colon cleansing. Barium was introduced into the colon in a retrograde fashion and refluxed from the rectum to the cecum. Spot images of the colon followed by overhead radiographs were obtained. FLUOROSCOPY TIME:  Fluoroscopy Time:  1.4 minute Radiation Exposure Index (if provided by the fluoroscopic device): 10.5 mGy Number of Acquired Spot Images: 0 COMPARISON:  CT neck 02/13/2020 FINDINGS: Normal pharyngeal anatomy and motility. Severe focal esophageal narrowing at the level of the tracheostomy tube restricting passage of contrast and making it difficult for the patient to swallow the oral bolus. There is no complete obstruction.  Tertiary contractions of the distal half of the esophagus as can be seen with mild spasm. No definite hiatal hernia was demonstrated. IMPRESSION: Severe focal esophageal narrowing at the level of the tracheostomy tube restricting passage of contrast most consistent with a high-grade stricture, making it difficult for the patient to swallow the oral bolus. No complete obstruction. Tertiary contractions of the distal half of the esophagus as can be seen with mild spasm. Electronically Signed   By: Kathreen Devoid   On: 02/14/2020 09:49     Echo reduced overall left ventricular function between 25 and 30%  TELEMETRY: Normal sinus rhythm nonspecific ST-T wave changes  ASSESSMENT AND PLAN:  Principal  Problem:   Difficulty swallowing Active Problems:   Anemia of chronic disease   Coronary artery disease   Hyperlipidemia   Hypertension   Squamous cell cancer of hypopharynx (HCC)   Acute deep vein thrombosis (DVT) of right upper extremity (HCC)   AF (paroxysmal atrial fibrillation) (HCC)   Chronic systolic CHF (congestive heart failure) (HCC)   ESRD (end stage renal disease) (HCC)   Hypoglycemia   COPD (chronic obstructive pulmonary disease) (HCC)   Type II diabetes mellitus with renal manifestations ([place])    Plan Continue hemodialysis therapy Agree with treatment for hypertension Long-term anticoagulation for DVT A. Fib Inhalers necessary for COPD Continue treatment for diabetes management Statin therapy for hyperlipidemia Continue conservative therapy for cardiac standpoint    Yolonda Kida, MD 02/15/2020 3:33 PM

## 2020-02-15 NOTE — Plan of Care (Signed)
  Problem: Education: Goal: Knowledge of General Education information will improve Description: Including pain rating scale, medication(s)/side effects and non-pharmacologic comfort measures Outcome: Progressing   Problem: Clinical Measurements: Goal: Respiratory complications will improve Outcome: Progressing   

## 2020-02-15 NOTE — Progress Notes (Signed)
PROGRESS NOTE    Kerry [name]   FUX:[id number]  DOB: 08/18/1933  PCP: Cecile Sheerer, MD    DOA: 02/13/2020 LOS: 2   Brief Narrative   Kerry [name] is a 86 y.o. 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  Vitals and labs in the ED were essentially unremarkable.  Labs consistent with ESRD, mild thrombocytopenia.    CT neck soft tissue showed "prominent debris within the oropharynx and upper esophagus which may reflect a food bolus. There is associated mild dilation of the upper esophagus. Findings raise the possibility of an upper esophageal stricture with obstruction.  Admitted for observation with GI consulted.  Barium esophagram on 9/10 confirmed severe focal esophageal narrowing at the level of the tracheostomy tube restricting passage of contrast most consistent with a high-grade stricture.  Eliquis is on hold and GI plans for EGD after 3 days off anticoagulation.     Assessment & Plan   Principal Problem:   Difficulty swallowing Active Problems:   Anemia of chronic disease   Coronary artery disease   Hyperlipidemia   Hypertension   Squamous cell cancer of hypopharynx (HCC)   Acute deep vein thrombosis (DVT) of right upper extremity (HCC)   AF (paroxysmal atrial fibrillation) (HCC)   Chronic systolic CHF (congestive heart failure) (HCC)   ESRD (end stage renal disease) (HCC)   Hypoglycemia   COPD (chronic obstructive pulmonary disease) (HCC)   Type II diabetes mellitus with renal manifestations ([place])   Difficulty swallowing due to esophageal stricture / narrowing - present on admission, to tolerance for any PO intake x 2 days  prior to admission.  GI is consulted.   Plan for EGD on Monday (off Eliquis 72 hours).  Hold Eliquis.  Cleared by cardiology for procedure.  Hypoglycemia - present on admission.  Continue D10 infusion. CBG's every 2 hours to monitor.  D50 PRN.  Hold insulin.  Thrombocytopenia - mild, present on admission with platelets 123k.  Monitor CBC closely, is on heparin.  Anemia of chronic disease - Hgb 13.0 on admission.  Monitor CBC.  Continue iron supplement.  Left great toe pain due ingrown nail - consulted podiatry.  Ingrown nail debrided on 9/10.  Daily mupirocin ointment and light bandage.  Follow up podiatry outpatient as needed.  Coronary artery disease - stable, no chest pain.  Continue Lipitor.  Not on beta blocker.  Hyperlipidemia - continue Lipitor  Type II diabetes mellitus with renal manifestations -  Recent A1c 5.5, well controlled.  Patient taking Lantus and glipizide.   Presented with hypoglycemia.  Hold Lantus and glipizide.   Hx of Hypertension - BP soft on admission, likely due to no PO intake and hypovolemia.  Is also on midodrine at home. -IV hydralazine as needed.  Hold midodrine until after EGD.  Squamous cell cancer of hypopharynx - s/p of tracheostomy with stoma on trach collar.  Follow-up in [place].  Acute DVT of right upper extremity - stable. Hold Eliquis fpr EGD, continue heparin infusion.  Paroxysmal A-fib - rate controlled.  Continue amiodarone.  Per cardiology, they'll consider transition to amio gtt if needed.  Hold Eliquis for EGD.  Chronic systolic CHF - appears euvolemic, asymptomatic  and currently well-compensated.    Echo on 11/30/2019 showed EF of 20-30%.  Volume management by dialysis.  ESRD - on T/Th/S hemodialysis.  Nephrology consulted for dialysis. Will get tomorrow.  COPD - albuterol nebs PRN   Patient BMI: Body mass index is 20.64 kg/m.   DVT prophylaxis:    Diet:  Diet Orders (From admission, onward)    Start     Ordered    02/13/20 0957  Diet NPO time specified Except for: Sips with Meds, Ice Chips  Diet effective now       Question Answer Comment  Except for Sips with Meds   Except for Ice Chips      02/13/20 0957            Code Status: Full Code    Subjective 02/15/20    Patient seen during dialysis this AM.  He reports he feels well.  Denies any acute complaints, including chest pain, SOB, F/C, N/V/D.  No acute events reported.   Disposition Plan & Communication   Status is: Inpatient  Remains inpatient appropriate because:Ongoing diagnostic testing needed not appropriate for outpatient work up   Dispo: The patient is from: Home              Anticipated d/c is to: Home              Anticipated d/c date is: 2-3 days              Patient currently is not medically stable to d/c.        Family Communication: wife at bedside during encounter, 9/10    Consults, Procedures, Significant Events   Consultants:   Gastroenterology  Nephrology  Cardiology  Procedures:   Barium esophagram 9/10  Antimicrobials:   None    Objective   Vitals:   02/14/20 2003 02/14/20 2318 02/15/20 0434 02/15/20 0813  BP: 123/77  114/72 117/79  Pulse: 73  72 74  Resp: _0 Temp: 98.4 F (36.9 C)  98.2 F (36.8 C) 98.2 F (36.8 C)  TempSrc: Oral   Oral  SpO2: 100% 97% 100% 100%  Weight:      Height:        Intake/Output Summary (Last 24 hours) at 02/15/2020 0907 Last data filed at 02/15/2020 0300 Gross per 24 hour  Intake 777.43 ml  Output 0 ml  Net 777.43 ml   Filed Weights   02/13/20 1703 02/14/20 0100  Weight: 57.7 kg 58 kg    Physical Exam:  General exam: awake, alert, no acute distress HEENT: hearing grossly normal, stoma appears clean and without secretions, trach collar in place Respiratory system: CTAB, no wheezes, rales or rhonchi, normal respiratory effort. Cardiovascular system: normal S1/S2, RRR, no pedal edema.   Central nervous system: A&O x3. no gross  focal neurologic deficits Extremities: moves all, no edema, normal tone  Labs   Data Reviewed: I have personally reviewed following labs and imaging studies  CBC: Recent Labs  Lab 02/12/20 1921 02/14/20 0103  WBC 5.8 4.6  HGB 13.0 13.2  HCT 37.4* 36.0*  MCV 94.4 90.7  PLT 153 546*   Basic Metabolic Panel: Recent Labs  Lab 02/12/20 1921 02/13/20 1007 02/14/20 0103 02/14/20 2254  NA 142 141 139  --   K 4.3 4.5 3.7  --   CL 99 98 97*  --   CO2 _1 --   GLUCOSE 55* 88 119*  --   BUN 45*  54* 25*  --   CREATININE 5.98* 7.00* 4.44*  --   CALCIUM 9.0 9.1 8.6*  --   PHOS  --   --   --  4.2   GFR: Estimated Creatinine Clearance: 10.2 mL/min (A) (by C-G formula based on SCr of 4.44 mg/dL (H)). Liver Function Tests: Recent Labs  Lab 02/12/20 1921  AST 19  ALT 21  ALKPHOS 54  BILITOT 1.4*  PROT 7.6  ALBUMIN 4.3   Recent Labs  Lab 02/12/20 1921  LIPASE 26   No results for input(s): AMMONIA in the last 168 hours. Coagulation Profile: Recent Labs  Lab 02/13/20 1009  INR 1.8*   Cardiac Enzymes: No results for input(s): CKTOTAL, CKMB, CKMBINDEX, TROPONINI in the last 168 hours. BNP (last 3 results) No results for input(s): PROBNP in the last 8760 hours. HbA1C: No results for input(s): HGBA1C in the last 72 hours. CBG: Recent Labs  Lab 02/14/20 2204 02/15/20 0032 02/15/20 0230 02/15/20 0434 02/15/20 0810  GLUCAP 102* 81 83 78 80   Lipid Profile: No results for input(s): CHOL, HDL, LDLCALC, TRIG, CHOLHDL, LDLDIRECT in the last 72 hours. Thyroid Function Tests: No results for input(s): TSH, T4TOTAL, FREET4, T3FREE, THYROIDAB in the last 72 hours. Anemia Panel: No results for input(s): VITAMINB12, FOLATE, FERRITIN, TIBC, IRON, RETICCTPCT in the last 72 hours. Sepsis Labs: No results for input(s): PROCALCITON, LATICACIDVEN in the last 168 hours.  Recent Results (from the past 240 hour(s))  SARS Coronavirus 2 by RT PCR (hospital order, performed in  [facility] hospital lab) Nasopharyngeal Nasopharyngeal Swab     Status: None   Collection Time: 02/13/20  7:53 AM   Specimen: Nasopharyngeal Swab  Result Value Ref Range Status   SARS Coronavirus 2 NEGATIVE NEGATIVE Final    Comment: (NOTE) SARS-CoV-2 target nucleic acids are NOT DETECTED.  The SARS-CoV-2 RNA is generally detectable in upper and lower respiratory specimens during the acute phase of infection. The lowest concentration of SARS-CoV-2 viral copies this assay can detect is 250 copies / mL. A negative result does not preclude SARS-CoV-2 infection and should not be used as the sole basis for treatment or other patient management decisions.  A negative result may occur with improper specimen collection / handling, submission of specimen other than nasopharyngeal swab, presence of viral mutation(s) within the areas targeted by this assay, and inadequate number of viral copies (<250 copies / mL). A negative result must be combined with clinical observations, patient history, and epidemiological information.  Fact Sheet for Patients:   StrictlyIdeas.no  Fact Sheet for Healthcare Providers: BankingDealers.co.za  This test is not yet approved or  cleared by the [place] FDA and has been authorized for detection and/or diagnosis of SARS-CoV-2 by FDA under an Emergency Use Authorization (EUA).  This EUA will remain in effect (meaning this test can be used) for the duration of the COVID-19 declaration under Section 564(b)(1) of the Act, 21 U.S.C. section 360bbb-3(b)(1), unless the authorization is terminated or revoked sooner.  Performed at [facility], [address], [place], [place] [address]   Surgical PCR screen     Status: None   Collection Time: 02/14/20  1:21 AM   Specimen: Nasal Mucosa; Nasal Swab  Result Value Ref Range Status   MRSA, PCR NEGATIVE NEGATIVE Final   Staphylococcus aureus NEGATIVE  NEGATIVE Final    Comment: (NOTE) The Xpert SA Assay (FDA approved for NASAL specimens in patients 14 years of age and older), is one component of  a comprehensive surveillance program. It is not intended to diagnose infection nor to guide or monitor treatment. Performed at [facility], [place], [place] [address]       Imaging Studies   DG ESOPHAGUS W SINGLE CM (SOL OR THIN BA)  Result Date: 02/14/2020 CLINICAL DATA:  Dysphagia EXAM: ESOPHAGUS/BARIUM SWALLOW/TABLET STUDY TECHNIQUE: Initial scout AP supine abdominal image obtained to insure adequate colon cleansing. Barium was introduced into the colon in a retrograde fashion and refluxed from the rectum to the cecum. Spot images of the colon followed by overhead radiographs were obtained. FLUOROSCOPY TIME:  Fluoroscopy Time:  1.4 minute Radiation Exposure Index (if provided by the fluoroscopic device): 10.5 mGy Number of Acquired Spot Images: 0 COMPARISON:  CT neck 02/13/2020 FINDINGS: Normal pharyngeal anatomy and motility. Severe focal esophageal narrowing at the level of the tracheostomy tube restricting passage of contrast and making it difficult for the patient to swallow the oral bolus. There is no complete obstruction. Tertiary contractions of the distal half of the esophagus as can be seen with mild spasm. No definite hiatal hernia was demonstrated. IMPRESSION: Severe focal esophageal narrowing at the level of the tracheostomy tube restricting passage of contrast most consistent with a high-grade stricture, making it difficult for the patient to swallow the oral bolus. No complete obstruction. Tertiary contractions of the distal half of the esophagus as can be seen with mild spasm. Electronically Signed   By: Kathreen Devoid   On: 02/14/2020 09:49     Medications   Scheduled Meds: . amiodarone  100 mg Oral Daily  . chlorhexidine  15 mL Mouth Rinse BID  . chlorhexidine gluconate (MEDLINE KIT)  15 mL Mouth Rinse  BID  . [START ON 02/17/2020] levothyroxine  25 mcg Intravenous Q0600  . mouth rinse  15 mL Mouth Rinse q12n4p  . sodium chloride flush  3 mL Intravenous Q12H   Continuous Infusions: . sodium chloride    . dextrose 25 mL/hr at 02/15/20 0542  . heparin 550 Units/hr (02/15/20 0546)       LOS: 2 days    Time spent: 20 minutes    Ezekiel Slocumb, DO [facility]  02/15/2020, 9:07 AM    If 7PM-7AM, please contact night-coverage. How to contact the [facility] Attending or Consulting provider [place] or covering provider during after hours [place], for this patient?    1. Check the care team in [facility] and look for a) attending/consulting [facility] provider listed and b) the [facility] team listed 2. Log into [email or web address] and use [facility]'s universal password to access. If you do not have the password, please contact the hospital operator. 3. Locate the [facility] provider you are looking for under [facility] and page to a number that you can be directly reached. 4. If you still have difficulty reaching the provider, please page the [facility] (Director on Call) for the Hospitalists listed on amion for assistance.

## 2020-02-15 NOTE — Progress Notes (Signed)
ANTICOAGULATION CONSULT NOTE - Initial Consult  Pharmacy Consult for Heparin Infusion Dosing  Indication: atrial fibrillation and DVT  Allergies  Allergen Reactions  . Ace Inhibitors Other (See Comments)    Potassium elevated  . Gabapentin Other (See Comments)    Ataxia at 100 mg.  . Losartan Other (See Comments)    Hyperkalemia    Patient Measurements: Height: 5\' 6"  (167.6 cm) Weight: 58 kg (127 lb 14.4 oz) IBW/kg (Calculated) : 63.8  Heparin dosing wt 57.7kg   Vital Signs: Temp: 98.6 F (37 C) (09/11 1529) Temp Source: Oral (09/11 1529) BP: 113/69 (09/11 1529) Pulse Rate: 45 (09/11 1529)  Labs: Recent Labs    02/12/20 2133 02/13/20 1007 02/13/20 1009 02/13/20 1009 02/14/20 0103 02/14/20 0301 02/14/20 2254 02/15/20 0820 02/15/20 1817  HGB  --   --   --   --  13.2  --   --  12.5*  --   HCT  --   --   --   --  36.0*  --   --  35.3*  --   PLT  --   --   --   --  123*  --   --  108*  --   APTT  --   --  51*   < > >160*   < > 128* 59* 62*  LABPROT  --   --  20.4*  --   --   --   --   --   --   INR  --   --  1.8*  --   --   --   --   --   --   HEPARINUNFRC  --   --  >3.60*  --   --   --   --  3.12*  --   CREATININE  --  7.00*  --   --  4.44*  --   --  6.69*  --   TROPONINIHS 52*  --   --   --   --   --   --   --   --    < > = values in this interval not displayed.    Estimated Creatinine Clearance: 6.7 mL/min (A) (by C-G formula based on SCr of 6.69 mg/dL (H)).   Medical History: Past Medical History:  Diagnosis Date  . Cancer ([place])   . CHF (congestive heart failure) ([place])   . COPD (chronic obstructive pulmonary disease) ([place])   . Diabetes mellitus without complication ([place])   . Hypertension   . Renal disorder     Assessment: 86 yo male with PMH of RUE DVT and A. Fib. Patient taking Apixaban (Eliquis) PTA, but presents with difficulty swallowing, nausea vomiting. Pharmacy consulted for heparin infusion dosing and monitoring. Patient reports last dose of  apixaban 9/8 AM, but he threw up shortly after. Baseline aPTT, INR, and anti-Xa level elevated.   09/10 2300 aPTT 128 sec supratherapeutic - rate reduced to 550 units/hr 09/11 0820 aPTT 59 sec slightly subtherapeutic (heparin level still elevated from Eliquis) 09/11 1817 aPTT 62 sec slightly subtherapeutic  Goal of Therapy:  Heparin level 0.3-0.7 units/ml aPTT 66-102 seconds Monitor platelets by anticoagulation protocol: Yes   Plan:  Slightly subtherapeutic, will bolus 800 units and increase rate to 750 units/hr   Will recheck aPTT at 0400 w/ anti-Xa check for correlation. Daily CBC.  Paulina Fusi, PharmD, BCPS 02/15/2020 7:58 PM

## 2020-02-15 NOTE — Progress Notes (Signed)
Kerry Antigua, MD [address] [place], [place], [place], [address] [address], [place], [place], [place], [address] Phone: [phone]  Fax: [phone]   Subjective: Patient laying in bed comfortably.  Denies any complaints.   Objective: Exam: Vital signs in last 24 hours: Vitals:   02/14/20 1616 02/14/20 2003 02/14/20 2318 02/15/20 0434  BP: 123/83 123/77  114/72  Pulse: 93 73  72  Resp: 19 16  16   Temp: 98.4 F (36.9 C) 98.4 F (36.9 C)  98.2 F (36.8 C)  TempSrc: Oral Oral    SpO2: 100% 100% 97% 100%  Weight:      Height:       Weight change:   Intake/Output Summary (Last 24 hours) at 02/15/2020 0737 Last data filed at 02/15/2020 0300 Gross per 24 hour  Intake 777.43 ml  Output 0 ml  Net 777.43 ml    General: No acute distress, AAO x3 Abd: Soft, NT/ND, No HSM Skin: Warm, no rashes Neck: Supple, Trachea midline   Lab Results: Lab Results  Component Value Date   WBC 4.6 02/14/2020   HGB 13.2 02/14/2020   HCT 36.0 (L) 02/14/2020   MCV 90.7 02/14/2020   PLT 123 (L) 02/14/2020   Micro Results: Recent Results (from the past 240 hour(s))  SARS Coronavirus 2 by RT PCR (hospital order, performed in [place] hospital lab) Nasopharyngeal Nasopharyngeal Swab     Status: None   Collection Time: 02/13/20  7:53 AM   Specimen: Nasopharyngeal Swab  Result Value Ref Range Status   SARS Coronavirus 2 NEGATIVE NEGATIVE Final    Comment: (NOTE) SARS-CoV-2 target nucleic acids are NOT DETECTED.  The SARS-CoV-2 RNA is generally detectable in upper and lower respiratory specimens during the acute phase of infection. The lowest concentration of SARS-CoV-2 viral copies this assay can detect is 250 copies / mL. A negative result does not preclude SARS-CoV-2 infection and should not be used as the sole basis for treatment or other patient management decisions.  A negative result may occur with improper specimen collection / handling, submission of  specimen other than nasopharyngeal swab, presence of viral mutation(s) within the areas targeted by this assay, and inadequate number of viral copies (<250 copies / mL). A negative result must be combined with clinical observations, patient history, and epidemiological information.  Fact Sheet for Patients:   StrictlyIdeas.no  Fact Sheet for Healthcare Providers: BankingDealers.co.za  This test is not yet approved or  cleared by the [place] FDA and has been authorized for detection and/or diagnosis of SARS-CoV-2 by FDA under an Emergency Use Authorization (EUA).  This EUA will remain in effect (meaning this test can be used) for the duration of the COVID-19 declaration under Section 564(b)(1) of the Act, 21 U.S.C. section 360bbb-3(b)(1), unless the authorization is terminated or revoked sooner.  Performed at [facility], [address], [place], [place] [address]   Surgical PCR screen     Status: None   Collection Time: 02/14/20  1:21 AM   Specimen: Nasal Mucosa; Nasal Swab  Result Value Ref Range Status   MRSA, PCR NEGATIVE NEGATIVE Final   Staphylococcus aureus NEGATIVE NEGATIVE Final    Comment: (NOTE) The Xpert SA Assay (FDA approved for NASAL specimens in patients 28 years of age and older), is one component of a comprehensive surveillance program. It is not intended to diagnose infection nor to guide or monitor treatment. Performed at [facility], [address], [place], [place] [address]  Studies/Results: CT ABDOMEN PELVIS WO CONTRAST  Result Date: 02/13/2020 CLINICAL DATA:  Vomiting, hypoglycemia, abdominal pain, question bowel obstruction, dialysis patient EXAM: CT ABDOMEN AND PELVIS WITHOUT CONTRAST TECHNIQUE: Multidetector CT imaging of the abdomen and pelvis was performed following the standard protocol without IV contrast. Sagittal and coronal MPR images reconstructed from axial data set.  Oral contrast was not administered. COMPARISON:  12/06/2019 FINDINGS: Lower chest: Small to moderate RIGHT pleural effusion, slightly increased. Resolution of previously seen LEFT pleural effusion. Compressive atelectasis RIGHT lower lobe. Enlargement of cardiac chambers. Hepatobiliary: High attenuation material within gallbladder increased from previous exam, could represent layered calculi, milk of calcium or vicarious excretion of contrast if recently administered. No definite focal hepatic abnormality. Pancreas: Normal appearance Spleen: Normal appearance Adrenals/Urinary Tract: Thickening of adrenal glands without discrete mass. Atrophic kidneys consistent with history of dialysis. Questionable tiny hyperdense cyst 4 mm diameter at mid RIGHT kidney. No additional renal mass or hydronephrosis. Ureters and bladder decompressed. Stomach/Bowel: Mild rectal wall thickening. Appendix not visualized. Gaseous distention of sigmoid loop extending cranially to the LEFT lobe of the liver, 6.8 cm transverse. Remainder of colon decompressed. Stomach decompressed. No bowel dilatation or evidence of obstruction. Mild sigmoid diverticulosis incidentally noted without evidence of diverticulitis. Vascular/Lymphatic: Extensive atherosclerotic calcifications aorta, iliac arteries, and coronary arteries. Significant calcified plaque at the origins of the celiac, superior mesenteric, and BILATERAL renal arteries. Aorta normal caliber. No adenopathy. Reproductive: Mild prostatic enlargement. Other: Small amount of free fluid in pelvis and LEFT mid abdomen. No free air. No hernia. Musculoskeletal: Osseous demineralization. Multilevel degenerative disc disease changes thoracolumbar spine. Chronic superior endplate concavity L1 unchanged. Degenerative changes of both hip joints. IMPRESSION: Gaseous distention of sigmoid loop extending cranially to the LEFT lobe of the liver, 6.8 cm transverse without wall thickening or obstruction. Mild  rectal wall thickening question proctitis; recommend correlation with proctoscopy. Small amount of nonspecific free fluid in pelvis and LEFT mid abdomen. Small to moderate RIGHT pleural effusion, slightly increased. High attenuation material within gallbladder increased from previous exam, could represent layered calculi, milk of calcium or vicarious excretion of contrast if recently administered. Mild prostatic enlargement. Aortic Atherosclerosis (ICD10-I70.0). Electronically Signed   By: Lavonia Dana M.D.   On: 02/13/2020 08:49   DG Chest 2 View  Result Date: 02/13/2020 CLINICAL DATA:  Vomiting.  Difficulty swallowing. EXAM: CHEST - 2 VIEW COMPARISON:  CT 12/06/2019.  Chest x-ray 12/02/2019. FINDINGS: Interim removal of tracheostomy tube. Stable cardiomegaly. Bibasilar atelectasis/infiltrates again noted. Small right pleural effusion again noted. No left pleural effusion noted on today's exam. No pneumothorax. Surgical clips right neck. IMPRESSION: 1.  Interim removal of tracheostomy tube. 2.  Stable cardiomegaly. 3. Bibasilar atelectasis/infiltrates again noted. Small right pleural effusion again noted. No left pleural effusion noted on today's exam. Electronically Signed   By: Marcello Moores  Register   On: 02/13/2020 08:19   CT Head Wo Contrast  Result Date: 02/13/2020 CLINICAL DATA:  Dysphagia EXAM: CT HEAD WITHOUT CONTRAST TECHNIQUE: Contiguous axial images were obtained from the base of the skull through the vertex without intravenous contrast. COMPARISON:  November 28, 2019 FINDINGS: Brain: There is stable mild diffuse atrophy. There is no intracranial mass, hemorrhage, extra-axial fluid collection, or midline shift. Slight small vessel disease in the centra semiovale bilaterally is stable. No acute infarct is demonstrable on this study. Vascular: No hyperdense vessel. There are foci of calcification in the carotid siphon regions bilaterally. Skull: The bony calvarium appears intact. Sinuses/Orbits: Visualized  paranasal sinuses are clear. Visualized  orbits appear symmetric bilaterally. Other: Mastoid air cells are clear. IMPRESSION: Atrophy with slight periventricular small vessel disease. No acute infarct. No mass or hemorrhage. There are foci of arterial vascular calcification. Electronically Signed   By: Lowella Grip III M.D.   On: 02/13/2020 08:46   CT Soft Tissue Neck Wo Contrast  Result Date: 02/13/2020 CLINICAL DATA:  Lymphadenopathy, neck. History of throat cancer, difficulty swallowing. Abdominal and throat pain. EXAM: CT NECK WITHOUT CONTRAST TECHNIQUE: Multidetector CT imaging of the neck was performed following the standard protocol without intravenous contrast. COMPARISON:  No pertinent prior exams are available for comparison. FINDINGS: Pharynx and larynx: Prior total laryngectomy. Please note there is limited assessment for residual/recurrent malignancy on this non-contrast examination. Within this limitation, no definite discrete mass is identified within the oral cavity or pharynx. There is prominent debris within the oropharynx and upper esophagus which may reflect a food bolus. There is associated mild dilation of the upper esophagus. The esophagus becomes decompressed at the C7 level. Salivary glands: There are a few nonspecific calcifications within the parotid glands bilaterally. The submandibular glands are unremarkable. Thyroid: The right thyroid lobe is poorly delineated and may be surgically absent. There are subcentimeter nodules within the left thyroid lobe not meeting consensus criteria for ultrasound follow-up. Lymph nodes: Lack of intravenous contrast administration limits evaluation for cervical lymphadenopathy. Within this limitation, no pathologically enlarged or suspicious cervical chain lymph nodes are identified. Vascular: There is limited evaluation of the major vascular structures of the neck on this noncontrast examination. Atherosclerotic calcifications within the  visualized aortic arch, proximal major branch vessels of the neck and carotid arteries. Limited intracranial: No acute intracranial abnormality is identified. Visualized orbits: Visualized orbits show no acute finding. Mastoids and visualized paranasal sinuses: Mild left sphenoid sinus mucosal thickening. No significant mastoid effusion. Skeleton: Advanced cervical spondylosis with multilevel disc space narrowing, posterior disc osteophytes, uncovertebral and facet hypertrophy. There is fusion across the C4-C5 disc space. Also of note, there is advanced C5-C6 disc degeneration with degenerative endplate irregularity and degenerative endplate sclerosis. Prominent multilevel ventral osteophytes, most notably at C5-C6 and C6-C7. Upper chest: Partially visualized right pleural effusion, likely moderate in size. There are multifocal ground-glass opacities within the imaged lung apices measuring up to 8 mm. Other: TEP device noted. IMPRESSION: Prior total laryngectomy. Within the limitations of a non-contrast examination, no definite residual/recurrent soft tissue neck mass is identified. No pathologically enlarged cervical chain lymph nodes are identified. There is prominent debris within the oropharynx and upper esophagus which may reflect a food bolus. There is associated mild dilation of the upper esophagus. Findings raise the possibility of an upper esophageal stricture with obstruction. An esophagram may be helpful for further evaluation. Prominent ventral osteophytes most notably at the C5-C6 and C6-C7 levels, which may exert some mass effect upon the upper esophagus. Multifocal ground-glass opacities within the imaged lung apices measuring up to 8 mm. Findings may be infectious/inflammatory in etiology. However, follow-up is recommended to exclude alternative etiologies (i.e. neoplasm). Non-contrast chest CT at 3-6 months is recommended. If nodules persist, subsequent management will be based upon the most  suspicious nodule(s). This recommendation follows the consensus statement: Guidelines for Management of Incidental Pulmonary Nodules Detected on CT Images: From the Fleischner Society 2017; Radiology 2017; 284:228-243. Partially imaged right pleural effusion, likely moderate in size. Electronically Signed   By: Kellie Simmering DO   On: 02/13/2020 09:07   DG ESOPHAGUS W SINGLE CM (SOL OR THIN BA)  Result Date:  02/14/2020 CLINICAL DATA:  Dysphagia EXAM: ESOPHAGUS/BARIUM SWALLOW/TABLET STUDY TECHNIQUE: Initial scout AP supine abdominal image obtained to insure adequate colon cleansing. Barium was introduced into the colon in a retrograde fashion and refluxed from the rectum to the cecum. Spot images of the colon followed by overhead radiographs were obtained. FLUOROSCOPY TIME:  Fluoroscopy Time:  1.4 minute Radiation Exposure Index (if provided by the fluoroscopic device): 10.5 mGy Number of Acquired Spot Images: 0 COMPARISON:  CT neck 02/13/2020 FINDINGS: Normal pharyngeal anatomy and motility. Severe focal esophageal narrowing at the level of the tracheostomy tube restricting passage of contrast and making it difficult for the patient to swallow the oral bolus. There is no complete obstruction. Tertiary contractions of the distal half of the esophagus as can be seen with mild spasm. No definite hiatal hernia was demonstrated. IMPRESSION: Severe focal esophageal narrowing at the level of the tracheostomy tube restricting passage of contrast most consistent with a high-grade stricture, making it difficult for the patient to swallow the oral bolus. No complete obstruction. Tertiary contractions of the distal half of the esophagus as can be seen with mild spasm. Electronically Signed   By: Kathreen Devoid   On: 02/14/2020 09:49   Medications:  Scheduled Meds: . amiodarone  100 mg Oral Daily  . chlorhexidine  15 mL Mouth Rinse BID  . chlorhexidine gluconate (MEDLINE KIT)  15 mL Mouth Rinse BID  . [START ON 02/17/2020]  levothyroxine  25 mcg Intravenous Q0600  . mouth rinse  15 mL Mouth Rinse q12n4p  . sodium chloride flush  3 mL Intravenous Q12H   Continuous Infusions: . sodium chloride    . dextrose 25 mL/hr at 02/15/20 0542  . heparin 550 Units/hr (02/15/20 0546)   PRN Meds:.sodium chloride, acetaminophen **OR** acetaminophen, albuterol, dextrose, heparin, hydrALAZINE, lidocaine-prilocaine, ondansetron (ZOFRAN) IV, polyethylene glycol, sodium chloride flush   Assessment: Principal Problem:   Difficulty swallowing Active Problems:   Anemia of chronic disease   Coronary artery disease   Hyperlipidemia   Hypertension   Squamous cell cancer of hypopharynx (HCC)   Acute deep vein thrombosis (DVT) of right upper extremity (HCC)   AF (paroxysmal atrial fibrillation) (HCC)   Chronic systolic CHF (congestive heart failure) (HCC)   ESRD (end stage renal disease) (HCC)   Hypoglycemia   COPD (chronic obstructive pulmonary disease) (HCC)   Type II diabetes mellitus with renal manifestations ([place])    Plan: As per Dr. Verlin Grills note and discussion with patient's wife, plan is for EGD with possible dilation on Monday, to allow time off Eliquis.  However, given location and severity of stricture, this may not be successful and other specialties or interventions may be required.  Continue to hold Eliquis as per above plan  Dr. Vicente Males will cover on Monday and through the week   LOS: 2 days   Kerry Antigua, MD 02/15/2020, 7:37 AM

## 2020-02-15 NOTE — Progress Notes (Signed)
[facility]  ROUNDING NOTE   Subjective:  Patient seen and evaluated during hemodialysis. Ultrafiltration target 1.5 kg today. Resting comfortably.   Objective:  Vital signs in last 24 hours:  Temp:  [98.2 F (36.8 C)-98.4 F (36.9 C)] 98.2 F (36.8 C) (09/11 0813) Pulse Rate:  [72-93] 74 (09/11 0813) Resp:  [16-19] 19 (09/11 0813) BP: (114-123)/(72-83) 117/79 (09/11 0813) SpO2:  [97 %-100 %] 100 % (09/11 0930)  Weight change:  Filed Weights   02/13/20 1703 02/14/20 0100  Weight: 57.7 kg 58 kg    Intake/Output: I/O last 3 completed shifts: In: 1215.4 [I.V.:1215.4] Out: 0    Intake/Output this shift:  No intake/output data recorded.  Physical Exam: General:  No acute distress  Head:  Normocephalic, atraumatic. Moist oral mucosal membranes  Eyes:  Anicteric  Neck:  Tracheostomy in place  Lungs:   Basilar rales, normal effort  Heart:  S1S2 no rubs  Abdomen:   Soft, nontender, bowel sounds present  Extremities:  No peripheral edema.  Neurologic:  Awake, alert, following commands  Skin:  No lesions  Access:  Left upper extremity AV graft    Basic Metabolic Panel: Recent Labs  Lab 02/12/20 1921 02/12/20 1921 02/13/20 1007 02/14/20 0103 02/14/20 2254 02/15/20 0820  NA 142  --  141 139  --  134*  K 4.3  --  4.5 3.7  --  3.9  CL 99  --  98 97*  --  96*  CO2 26  --  25 29  --  23  GLUCOSE 55*  --  88 119*  --  73  BUN 45*  --  54* 25*  --  35*  CREATININE 5.98*  --  7.00* 4.44*  --  6.69*  CALCIUM 9.0   < > 9.1 8.6*  --  8.5*  PHOS  --   --   --   --  4.2  --    < > = values in this interval not displayed.    Liver Function Tests: Recent Labs  Lab 02/12/20 1921  AST 19  ALT 21  ALKPHOS 54  BILITOT 1.4*  PROT 7.6  ALBUMIN 4.3   Recent Labs  Lab 02/12/20 1921  LIPASE 26   No results for input(s): AMMONIA in the last 168 hours.  CBC: Recent Labs  Lab 02/12/20 1921 02/14/20 0103 02/15/20 0820  WBC 5.8 4.6 3.8*  HGB 13.0  13.2 12.5*  HCT 37.4* 36.0* 35.3*  MCV 94.4 90.7 91.5  PLT 153 123* 108*    Cardiac Enzymes: No results for input(s): CKTOTAL, CKMB, CKMBINDEX, TROPONINI in the last 168 hours.  BNP: Invalid input(s): POCBNP  CBG: Recent Labs  Lab 02/14/20 2204 02/15/20 0032 02/15/20 0230 02/15/20 0434 02/15/20 0810  GLUCAP 102* 81 83 78 80    Microbiology: Results for orders placed or performed during the hospital encounter of 02/13/20  SARS Coronavirus 2 by RT PCR (hospital order, performed in [facility] hospital lab) Nasopharyngeal Nasopharyngeal Swab     Status: None   Collection Time: 02/13/20  7:53 AM   Specimen: Nasopharyngeal Swab  Result Value Ref Range Status   SARS Coronavirus 2 NEGATIVE NEGATIVE Final    Comment: (NOTE) SARS-CoV-2 target nucleic acids are NOT DETECTED.  The SARS-CoV-2 RNA is generally detectable in upper and lower respiratory specimens during the acute phase of infection. The lowest concentration of SARS-CoV-2 viral copies this assay can detect is 250 copies / mL. A negative result does not preclude  SARS-CoV-2 infection and should not be used as the sole basis for treatment or other patient management decisions.  A negative result may occur with improper specimen collection / handling, submission of specimen other than nasopharyngeal swab, presence of viral mutation(s) within the areas targeted by this assay, and inadequate number of viral copies (<250 copies / mL). A negative result must be combined with clinical observations, patient history, and epidemiological information.  Fact Sheet for Patients:   StrictlyIdeas.no  Fact Sheet for Healthcare Providers: BankingDealers.co.za  This test is not yet approved or  cleared by the [place] FDA and has been authorized for detection and/or diagnosis of SARS-CoV-2 by FDA under an Emergency Use Authorization (EUA).  This EUA will remain in effect (meaning  this test can be used) for the duration of the COVID-19 declaration under Section 564(b)(1) of the Act, 21 U.S.C. section 360bbb-3(b)(1), unless the authorization is terminated or revoked sooner.  Performed at [facility], [address], [place], [place] [address]   Surgical PCR screen     Status: None   Collection Time: 02/14/20  1:21 AM   Specimen: Nasal Mucosa; Nasal Swab  Result Value Ref Range Status   MRSA, PCR NEGATIVE NEGATIVE Final   Staphylococcus aureus NEGATIVE NEGATIVE Final    Comment: (NOTE) The Xpert SA Assay (FDA approved for NASAL specimens in patients 44 years of age and older), is one component of a comprehensive surveillance program. It is not intended to diagnose infection nor to guide or monitor treatment. Performed at [facility], [address] [place], [place] [address]     Coagulation Studies: Recent Labs    02/13/20 1009  LABPROT 20.4*  INR 1.8*    Urinalysis: No results for input(s): COLORURINE, LABSPEC, PHURINE, GLUCOSEU, HGBUR, BILIRUBINUR, KETONESUR, PROTEINUR, UROBILINOGEN, NITRITE, LEUKOCYTESUR in the last 72 hours.  Invalid input(s): APPERANCEUR    Imaging: DG ESOPHAGUS W SINGLE CM (SOL OR THIN BA)  Result Date: 02/14/2020 CLINICAL DATA:  Dysphagia EXAM: ESOPHAGUS/BARIUM SWALLOW/TABLET STUDY TECHNIQUE: Initial scout AP supine abdominal image obtained to insure adequate colon cleansing. Barium was introduced into the colon in a retrograde fashion and refluxed from the rectum to the cecum. Spot images of the colon followed by overhead radiographs were obtained. FLUOROSCOPY TIME:  Fluoroscopy Time:  1.4 minute Radiation Exposure Index (if provided by the fluoroscopic device): 10.5 mGy Number of Acquired Spot Images: 0 COMPARISON:  CT neck 02/13/2020 FINDINGS: Normal pharyngeal anatomy and motility. Severe focal esophageal narrowing at the level of the tracheostomy tube restricting passage of contrast and making it  difficult for the patient to swallow the oral bolus. There is no complete obstruction. Tertiary contractions of the distal half of the esophagus as can be seen with mild spasm. No definite hiatal hernia was demonstrated. IMPRESSION: Severe focal esophageal narrowing at the level of the tracheostomy tube restricting passage of contrast most consistent with a high-grade stricture, making it difficult for the patient to swallow the oral bolus. No complete obstruction. Tertiary contractions of the distal half of the esophagus as can be seen with mild spasm. Electronically Signed   By: Kathreen Devoid   On: 02/14/2020 09:49     Medications:   . sodium chloride    . dextrose 25 mL/hr at 02/15/20 0542  . heparin 550 Units/hr (02/15/20 0546)   . amiodarone  100 mg Oral Daily  . chlorhexidine  15 mL Mouth Rinse BID  . chlorhexidine gluconate (MEDLINE KIT)  15 mL Mouth Rinse BID  .  heparin  800 Units Intravenous Once  . [START ON 02/17/2020] levothyroxine  25 mcg Intravenous Q0600  . mouth rinse  15 mL Mouth Rinse q12n4p  . sodium chloride flush  3 mL Intravenous Q12H   sodium chloride, acetaminophen **OR** acetaminophen, albuterol, dextrose, heparin, hydrALAZINE, lidocaine-prilocaine, ondansetron (ZOFRAN) IV, polyethylene glycol, sodium chloride flush  Assessment/ Plan:  86 y.o. male with past medical history of ESRD on HD TTS, anemia of chronic kidney disease, secondary hyperparathyroidism, laryngeal cancer status post tracheostomy placement who was admitted with nausea, vomiting, dysphagia.  [facility]/TTS/LUE AVG  1.  ESRD on HD TTS.  Patient seen and evaluated during dialysis treatment.  Tolerating well.  Ultrafiltration target 1.5 kg.  2.  Anemia of chronic kidney disease.  Hemoglobin 12.5.  Hold off on Epogen at this time.  3.  Secondary hyperparathyroidism.  Most recent serum phosphorus was 4.2 and acceptable.  Not currently on binders.  Continue to monitor bone metabolism parameters.    LOS: 2 Desta Bujak 9/11/202112:32 PM

## 2020-02-15 NOTE — Progress Notes (Signed)
ANTICOAGULATION CONSULT NOTE - Initial Consult  Pharmacy Consult for Heparin Infusion Dosing  Indication: atrial fibrillation and DVT  Allergies  Allergen Reactions  . Ace Inhibitors Other (See Comments)    Potassium elevated  . Gabapentin Other (See Comments)    Ataxia at 100 mg.  . Losartan Other (See Comments)    Hyperkalemia    Patient Measurements: Height: 5\' 6"  (167.6 cm) Weight: 58 kg (127 lb 14.4 oz) IBW/kg (Calculated) : 63.8  Heparin dosing wt 57.7kg   Vital Signs: Temp: 98.4 F (36.9 C) (09/10 2003) Temp Source: Oral (09/10 2003) BP: 123/77 (09/10 2003) Pulse Rate: 73 (09/10 2003)  Labs: Recent Labs    02/12/20 1921 02/12/20 2133 02/13/20 1007 02/13/20 1009 02/13/20 1009 02/14/20 0103 02/14/20 0103 02/14/20 0301 02/14/20 1312 02/14/20 2254  HGB 13.0  --   --   --   --  13.2  --   --   --   --   HCT 37.4*  --   --   --   --  36.0*  --   --   --   --   PLT 153  --   --   --   --  123*  --   --   --   --   APTT  --   --   --  51*   < > >160*   < > 117* 105* 128*  LABPROT  --   --   --  20.4*  --   --   --   --   --   --   INR  --   --   --  1.8*  --   --   --   --   --   --   HEPARINUNFRC  --   --   --  >3.60*  --   --   --   --   --   --   CREATININE 5.98*  --  7.00*  --   --  4.44*  --   --   --   --   TROPONINIHS 47* 52*  --   --   --   --   --   --   --   --    < > = values in this interval not displayed.    Estimated Creatinine Clearance: 10.2 mL/min (A) (by C-G formula based on SCr of 4.44 mg/dL (H)).   Medical History: Past Medical History:  Diagnosis Date  . Cancer ([place])   . CHF (congestive heart failure) ([place])   . COPD (chronic obstructive pulmonary disease) ([place])   . Diabetes mellitus without complication ([place])   . Hypertension   . Renal disorder     Assessment: 86 yo male with PMH of RUE DVT and A. Fib. Patient taking Apixaban (Eliquis) PTA, but presents with difficulty swallowing, nausea vomiting. Pharmacy consulted for heparin  infusion dosing and monitoring. Patient reports last dose of apixaban 9/8 AM, but he threw up shortly after. Baseline aPTT, INR, and anti-Xa level elevated.   Goal of Therapy:  Heparin level 0.3-0.7 units/ml aPTT 66-102 seconds Monitor platelets by anticoagulation protocol: Yes   Plan:  09/10 2300 aPTT 128 seconds supratherapeutic. Will reduce rate to 550 units/hr and will recheck aPTT at 0730 w/ anti-Xa check for correlation w/ am labs and will continue to monitor.  Tobie Lords, PharmD, BCPS Clinical Pharmacist 02/15/2020 2:48 AM

## 2020-02-16 LAB — GLUCOSE, CAPILLARY
Glucose-Capillary: 103 mg/dL — ABNORMAL HIGH (ref 70–99)
Glucose-Capillary: 69 mg/dL — ABNORMAL LOW (ref 70–99)
Glucose-Capillary: 80 mg/dL (ref 70–99)
Glucose-Capillary: 81 mg/dL (ref 70–99)
Glucose-Capillary: 82 mg/dL (ref 70–99)
Glucose-Capillary: 82 mg/dL (ref 70–99)
Glucose-Capillary: 84 mg/dL (ref 70–99)
Glucose-Capillary: 87 mg/dL (ref 70–99)
Glucose-Capillary: 90 mg/dL (ref 70–99)
Glucose-Capillary: 91 mg/dL (ref 70–99)
Glucose-Capillary: 94 mg/dL (ref 70–99)
Glucose-Capillary: 97 mg/dL (ref 70–99)

## 2020-02-16 LAB — APTT
aPTT: 91 seconds — ABNORMAL HIGH (ref 24–36)
aPTT: 113 seconds — ABNORMAL HIGH (ref 24–36)
aPTT: 61 seconds — ABNORMAL HIGH (ref 24–36)

## 2020-02-16 LAB — CBC
HCT: 33.9 % — ABNORMAL LOW (ref 39.0–52.0)
Hemoglobin: 12.5 g/dL — ABNORMAL LOW (ref 13.0–17.0)
MCH: 32.6 pg (ref 26.0–34.0)
MCHC: 36.9 g/dL — ABNORMAL HIGH (ref 30.0–36.0)
MCV: 88.3 fL (ref 80.0–100.0)
Platelets: 101 10*3/uL — ABNORMAL LOW (ref 150–400)
RBC: 3.84 MIL/uL — ABNORMAL LOW (ref 4.22–5.81)
RDW: 16.3 % — ABNORMAL HIGH (ref 11.5–15.5)
WBC: 4.2 10*3/uL (ref 4.0–10.5)
nRBC: 0 % (ref 0.0–0.2)

## 2020-02-16 LAB — HEPARIN LEVEL (UNFRACTIONATED): Heparin Unfractionated: >1.8 IU/mL — ABNORMAL HIGH (ref 0.30–0.70)

## 2020-02-16 MED ORDER — HEPARIN BOLUS VIA INFUSION
800.0000 [IU] | Freq: Once | INTRAVENOUS | Status: AC
  Administered 2020-02-16: 800 [IU] via INTRAVENOUS
  Filled 2020-02-16 (×2): qty 800

## 2020-02-16 NOTE — Progress Notes (Signed)
[facility]    SUBJECTIVE: Patient resting in bed doing reasonably well still with some congestion preop for vascular procedure with Dr. Lucky Cowboy in the morning status post dialysis yesterday in the room with his wife with no significant complaints.  Patient states to be able to tolerate amiodarone p.o.Marland Kitchen   Vitals:   02/15/20 1529 02/15/20 2040 02/16/20 0532 02/16/20 0810  BP: 113/69 116/81 114/69 120/89  Pulse: (!) 45 73 68 (!) 59  Resp: 18 17 18 18   Temp: 98.6 F (37 C) 98.5 F (36.9 C) 98.7 F (37.1 C) 97.8 F (36.6 C)  TempSrc: Oral Oral Oral Oral  SpO2: 92%  100% 100%  Weight:      Height:         Intake/Output Summary (Last 24 hours) at 02/16/2020 1033 Last data filed at 02/15/2020 1315 Gross per 24 hour  Intake --  Output 1500 ml  Net -1500 ml      PHYSICAL EXAM  General: Well developed, well nourished, in no acute distress HEENT:  Normocephalic and atramatic Neck:  No JVD.  Lungs: Clear bilaterally to auscultation and percussion. Heart: HRRR . Normal S1 and S2 without gallops or murmurs.  Abdomen: Bowel sounds are positive, abdomen soft and non-tender  Msk:  Back normal, normal gait. Normal strength and tone for age. Extremities: No clubbing, cyanosis or edema.   Neuro: Alert and oriented X 3. Psych:  Good affect, responds appropriately   LABS: Basic Metabolic Panel: Recent Labs    02/14/20 0103 02/14/20 2254 02/15/20 0820  NA 139  --  134*  K 3.7  --  3.9  CL 97*  --  96*  CO2 29  --  23  GLUCOSE 119*  --  73  BUN 25*  --  35*  CREATININE 4.44*  --  6.69*  CALCIUM 8.6*  --  8.5*  PHOS  --  4.2  --    Liver Function Tests: No results for input(s): AST, ALT, ALKPHOS, BILITOT, PROT, ALBUMIN in the last 72 hours. No results for input(s): LIPASE, AMYLASE in the last 72 hours. CBC: Recent Labs    02/15/20 0820 02/16/20 0251  WBC 3.8* 4.2  HGB 12.5* 12.5*  HCT 35.3* 33.9*  MCV 91.5 88.3  PLT 108* 101*   Cardiac Enzymes: No results for  input(s): CKTOTAL, CKMB, CKMBINDEX, TROPONINI in the last 72 hours. BNP: Invalid input(s): POCBNP D-Dimer: No results for input(s): DDIMER in the last 72 hours. Hemoglobin A1C: No results for input(s): HGBA1C in the last 72 hours. Fasting Lipid Panel: No results for input(s): CHOL, HDL, LDLCALC, TRIG, CHOLHDL, LDLDIRECT in the last 72 hours. Thyroid Function Tests: No results for input(s): TSH, T4TOTAL, T3FREE, THYROIDAB in the last 72 hours.  Invalid input(s): FREET3 Anemia Panel: No results for input(s): VITAMINB12, FOLATE, FERRITIN, TIBC, IRON, RETICCTPCT in the last 72 hours.  No results found.   Echo severely depressed overall left ventricular function ejection fraction between 25 and 30% globally  TELEMETRY: Normal sinus rhythm LVH nonspecific finding mild bradycardia  ASSESSMENT AND PLAN:  Principal Problem:   Difficulty swallowing Active Problems:   Anemia of chronic disease   Coronary artery disease   Hyperlipidemia   Hypertension   Squamous cell cancer of hypopharynx (HCC)   Acute deep vein thrombosis (DVT) of right upper extremity (HCC)   AF (paroxysmal atrial fibrillation) (HCC)   Chronic systolic CHF (congestive heart failure) (HCC)   ESRD (end stage renal disease) (HCC)   Hypoglycemia   COPD (  chronic obstructive pulmonary disease) (HCC)   Type II diabetes mellitus with renal manifestations (HCC)    Plan Continue amiodarone p.o. to help with rate management and control of A. Fib Maintain IV heparin for anticoagulation since Eliquis was held Preoperative clearance for procedure tomorrow with Dr. Lucky Cowboy End-stage renal disease on dialysis last episode was yesterday Inhalers as needed for COPD Continue treatment for hypertension therapy Agree with diabetes management and control with renal manifestation Recommend conservative supportive cardiac care at this point   Yolonda Kida, MD 02/16/2020 10:33 AM

## 2020-02-16 NOTE — Progress Notes (Signed)
PROGRESS NOTE    Kerry [name]   WJX:[id number]  DOB: 1933/12/03  PCP: Cecile Sheerer, MD    DOA: 02/13/2020 LOS: 3   Brief Narrative   Kerry [name] is a 86 y.o. 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  Vitals and labs in the ED were essentially unremarkable.  Labs consistent with ESRD, mild thrombocytopenia.    CT neck soft tissue showed "prominent debris within the oropharynx and upper esophagus which may reflect a food bolus. There is associated mild dilation of the upper esophagus. Findings raise the possibility of an upper esophageal stricture with obstruction.  Admitted for observation with GI consulted.  Barium esophagram on 9/10 confirmed severe focal esophageal narrowing at the level of the tracheostomy tube restricting passage of contrast most consistent with a high-grade stricture.  Eliquis is on hold and GI plans for EGD after 3 days off anticoagulation.     Assessment & Plan   Principal Problem:   Difficulty swallowing Active Problems:   Anemia of chronic disease   Coronary artery disease   Hyperlipidemia   Hypertension   Squamous cell cancer of hypopharynx (HCC)   Acute deep vein thrombosis (DVT) of right upper extremity (HCC)   AF (paroxysmal atrial fibrillation) (HCC)   Chronic systolic CHF (congestive heart failure) (HCC)   ESRD (end stage renal disease) (HCC)   Hypoglycemia   COPD (chronic obstructive pulmonary disease) (HCC)   Type II diabetes mellitus with renal manifestations ([place])   Difficulty swallowing due to esophageal stricture / narrowing - present on admission, to tolerance for any PO intake x 2 days  prior to admission.  GI is consulted.   Plan for EGD on Monday (off Eliquis 72 hours).  Hold Eliquis.  Cleared by cardiology for procedure.  Hypoglycemia - present on admission.  Continue D10 infusion. CBG's every 2 hours to monitor.  D50 PRN.  Hold insulin. For dialysis - would give D50 beforehand to prevent dropping, especially if D10 infusion is held during dialysis.  Thrombocytopenia - mild, present on admission with platelets 123k.  Monitor CBC closely, is on heparin.  Anemia of chronic disease - Hgb 13.0 on admission.  Monitor CBC.  Continue iron supplement.  Left great toe pain due ingrown nail - consulted podiatry.  Ingrown nail debrided on 9/10.  Daily mupirocin ointment and light bandage.  Follow up podiatry outpatient as needed.  Coronary artery disease - stable, no chest pain.  Continue Lipitor.  Not on beta blocker.  Hyperlipidemia - continue Lipitor  Type II diabetes mellitus with renal manifestations -  Recent A1c 5.5, well controlled.  Patient taking Lantus and glipizide.   Presented with hypoglycemia.  Hold Lantus and glipizide.   Hx of Hypertension - BP soft on admission, likely due to no PO intake and hypovolemia.  Is also on midodrine at home. -IV hydralazine as needed.  Hold midodrine until after EGD.  Squamous cell cancer of hypopharynx - s/p of tracheostomy with stoma on trach collar.  Follow-up in [place].  Acute DVT of right upper extremity - stable. Hold Eliquis fpr EGD, continue heparin infusion.  Paroxysmal A-fib - rate controlled.  Continue amiodarone.  Per cardiology, they'll consider transition  to amio gtt if needed.  Hold Eliquis for EGD.  Chronic systolic CHF - appears euvolemic, asymptomatic and currently well-compensated.    Echo on 11/30/2019 showed EF of 20-30%.  Volume management by dialysis.  ESRD - on T/Th/S hemodialysis.  Nephrology consulted for dialysis. Will get tomorrow.  COPD - albuterol nebs PRN   Patient BMI: Body mass index  is 20.64 kg/m.   DVT prophylaxis:    Diet:  Diet Orders (From admission, onward)    Start     Ordered   02/13/20 0957  Diet NPO time specified Except for: Sips with Meds, Ice Chips  Diet effective now       Question Answer Comment  Except for Sips with Meds   Except for Ice Chips      02/13/20 0957            Code Status: Full Code    Subjective 02/16/20    Patient seen with wife at bedside this AM.  He reports feeling well, but is hungry.  Denies trouble breathing, is able to swallow saliva without issues.  No fever/chills.  No acute events reported.  Says he wants to go home.  Wife mentions patient blood glucose dropped to 30's during dialysis yesterday, unclear if D10 not running at the time or not.  Lowest CBG charted was 66 at 1443 yesterday.   Disposition Plan & Communication   Status is: Inpatient  Remains inpatient appropriate because:Ongoing diagnostic testing needed not appropriate for outpatient work up   Dispo: The patient is from: Home              Anticipated d/c is to: Home              Anticipated d/c date is: 2 days              Patient currently is not medically stable to d/c.   Family Communication: wife at bedside during encounter, 9/12   Consults, Procedures, Significant Events   Consultants:   Gastroenterology  Nephrology  Cardiology  Procedures:   Barium esophagram 9/10  Antimicrobials:   None    Objective   Vitals:   02/15/20 2040 02/16/20 0532 02/16/20 0810 02/16/20 1148  BP: 116/81 114/69 120/89 121/78  Pulse: 73 68 (!) 59 78  Resp: 17 18 18 18   Temp: 98.5 F (36.9 C) 98.7 F (37.1 C) 97.8 F (36.6 C) 98.6 F (37 C)  TempSrc: Oral Oral Oral Oral  SpO2:  100% 100% 100%  Weight:      Height:        Intake/Output Summary (Last 24 hours) at 02/16/2020 1711 Last data filed at 02/16/2020 1546 Gross per 24 hour  Intake --  Output 0 ml  Net 0 ml   Filed Weights   02/13/20 1703 02/14/20 0100  Weight: 57.7 kg 58  kg    Physical Exam:  General exam: awake, alert, no acute distress, frail HEENT: stoma appears clean and without secretions, trach collar in place Respiratory system: CTAB, no wheezes, rales or rhonchi, normal respiratory effort. Cardiovascular system: normal S1/S2, RRR, no pedal edema.   Gastrointestinal: soft, nontender, +bowel sounds   Labs   Data Reviewed: I have personally reviewed following labs and imaging studies  CBC: Recent Labs  Lab 02/12/20 1921 02/14/20 0103 02/15/20 0820 02/16/20 0251  WBC 5.8 4.6 3.8* 4.2  HGB 13.0 13.2 12.5* 12.5*  HCT 37.4* 36.0* 35.3* 33.9*  MCV 94.4 90.7 91.5 88.3  PLT 153 123* 108*  837*   Basic Metabolic Panel: Recent Labs  Lab 02/12/20 1921 02/13/20 1007 02/14/20 0103 02/14/20 2254 02/15/20 0820  NA 142 141 139  --  134*  K 4.3 4.5 3.7  --  3.9  CL 99 98 97*  --  96*  CO2 26 25 29   --  23  GLUCOSE 55* 88 119*  --  73  BUN 45* 54* 25*  --  35*  CREATININE 5.98* 7.00* 4.44*  --  6.69*  CALCIUM 9.0 9.1 8.6*  --  8.5*  PHOS  --   --   --  4.2  --    GFR: Estimated Creatinine Clearance: 6.7 mL/min (A) (by C-G formula based on SCr of 6.69 mg/dL (H)). Liver Function Tests: Recent Labs  Lab 02/12/20 1921  AST 19  ALT 21  ALKPHOS 54  BILITOT 1.4*  PROT 7.6  ALBUMIN 4.3   Recent Labs  Lab 02/12/20 1921  LIPASE 26   No results for input(s): AMMONIA in the last 168 hours. Coagulation Profile: Recent Labs  Lab 02/13/20 1009  INR 1.8*   Cardiac Enzymes: No results for input(s): CKTOTAL, CKMB, CKMBINDEX, TROPONINI in the last 168 hours. BNP (last 3 results) No results for input(s): PROBNP in the last 8760 hours. HbA1C: No results for input(s): HGBA1C in the last 72 hours. CBG: Recent Labs  Lab 02/16/20 0534 02/16/20 0811 02/16/20 1017 02/16/20 1241 02/16/20 1520  GLUCAP 80 87 97 90 81   Lipid Profile: No results for input(s): CHOL, HDL, LDLCALC, TRIG, CHOLHDL, LDLDIRECT in the last 72 hours. Thyroid  Function Tests: No results for input(s): TSH, T4TOTAL, FREET4, T3FREE, THYROIDAB in the last 72 hours. Anemia Panel: No results for input(s): VITAMINB12, FOLATE, FERRITIN, TIBC, IRON, RETICCTPCT in the last 72 hours. Sepsis Labs: No results for input(s): PROCALCITON, LATICACIDVEN in the last 168 hours.  Recent Results (from the past 240 hour(s))  SARS Coronavirus 2 by RT PCR (hospital order, performed in [facility] hospital lab) Nasopharyngeal Nasopharyngeal Swab     Status: None   Collection Time: 02/13/20  7:53 AM   Specimen: Nasopharyngeal Swab  Result Value Ref Range Status   SARS Coronavirus 2 NEGATIVE NEGATIVE Final    Comment: (NOTE) SARS-CoV-2 target nucleic acids are NOT DETECTED.  The SARS-CoV-2 RNA is generally detectable in upper and lower respiratory specimens during the acute phase of infection. The lowest concentration of SARS-CoV-2 viral copies this assay can detect is 250 copies / mL. A negative result does not preclude SARS-CoV-2 infection and should not be used as the sole basis for treatment or other patient management decisions.  A negative result may occur with improper specimen collection / handling, submission of specimen other than nasopharyngeal swab, presence of viral mutation(s) within the areas targeted by this assay, and inadequate number of viral copies (<250 copies / mL). A negative result must be combined with clinical observations, patient history, and epidemiological information.  Fact Sheet for Patients:   StrictlyIdeas.no  Fact Sheet for Healthcare Providers: BankingDealers.co.za  This test is not yet approved or  cleared by the [place] FDA and has been authorized for detection and/or diagnosis of SARS-CoV-2 by FDA under an Emergency Use Authorization (EUA).  This EUA will remain in effect (meaning this test can be used) for the duration of the COVID-19 declaration under Section 564(b)(1)  of the Act, 21 U.S.C. section 360bbb-3(b)(1), unless the authorization is terminated or revoked sooner.  Performed at [facility], [address] [place],  [place] [address]   Surgical PCR screen     Status: None   Collection Time: 02/14/20  1:21 AM   Specimen: Nasal Mucosa; Nasal Swab  Result Value Ref Range Status   MRSA, PCR NEGATIVE NEGATIVE Final   Staphylococcus aureus NEGATIVE NEGATIVE Final    Comment: (NOTE) The Xpert SA Assay (FDA approved for NASAL specimens in patients 66 years of age and older), is one component of a comprehensive surveillance program. It is not intended to diagnose infection nor to guide or monitor treatment. Performed at [facility], [address] [place], [place] [address]       Imaging Studies   No results found.   Medications   Scheduled Meds: . amiodarone  100 mg Oral Daily  . chlorhexidine  15 mL Mouth Rinse BID  . chlorhexidine gluconate (MEDLINE KIT)  15 mL Mouth Rinse BID  . [START ON 02/17/2020] levothyroxine  25 mcg Intravenous Q0600  . mouth rinse  15 mL Mouth Rinse q12n4p  . sodium chloride flush  3 mL Intravenous Q12H   Continuous Infusions: . sodium chloride    . dextrose 25 mL/hr at 02/15/20 0542  . heparin 650 Units/hr (02/16/20 1145)       LOS: 3 days    Time spent: 20 minutes    Ezekiel Slocumb, DO [facility]  02/16/2020, 5:11 PM    If 7PM-7AM, please contact night-coverage. How to contact the [facility] Attending or Consulting provider [place] or covering provider during after hours [place], for this patient?    1. Check the care team in [facility] and look for a) attending/consulting [facility] provider listed and b) the [facility] team listed 2. Log into [email or web address] and use [place]'s universal password to access. If you do not have the password, please contact the hospital operator. 3. Locate the [facility] provider you are looking for under [facility] and page to a number that you can be  directly reached. 4. If you still have difficulty reaching the provider, please page the [facility] (Director on Call) for the Hospitalists listed on amion for assistance.

## 2020-02-16 NOTE — Progress Notes (Signed)
[facility]  ROUNDING NOTE   Subjective:  Patient seen and evaluated at bedside. Underwent dialysis treatment yesterday. Wife at the bedside today. Still on D10 drip.   Objective:  Vital signs in last 24 hours:  Temp:  [97.8 F (36.6 C)-98.7 F (37.1 C)] 98.6 F (37 C) (09/12 1148) Pulse Rate:  [45-78] 78 (09/12 1148) Resp:  [17-18] 18 (09/12 1148) BP: (113-121)/(69-89) 121/78 (09/12 1148) SpO2:  [92 %-100 %] 100 % (09/12 1148)  Weight change:  Filed Weights   02/13/20 1703 02/14/20 0100  Weight: 57.7 kg 58 kg    Intake/Output: I/O last 3 completed shifts: In: 777.4 [I.V.:777.4] Out: 1500 [Other:1500]   Intake/Output this shift:  No intake/output data recorded.  Physical Exam: General:  No acute distress  Head:  Normocephalic, atraumatic. Moist oral mucosal membranes  Eyes:  Anicteric  Neck:  Tracheostomy in place  Lungs:   Basilar rales, normal effort  Heart:  S1S2 no rubs  Abdomen:   Soft, nontender, bowel sounds present  Extremities:  No peripheral edema.  Neurologic:  Awake, alert, following commands  Skin:  No lesions  Access:  Left upper extremity AV graft    Basic Metabolic Panel: Recent Labs  Lab 02/12/20 1921 02/12/20 1921 02/13/20 1007 02/14/20 0103 02/14/20 2254 02/15/20 0820  NA 142  --  141 139  --  134*  K 4.3  --  4.5 3.7  --  3.9  CL 99  --  98 97*  --  96*  CO2 26  --  25 29  --  23  GLUCOSE 55*  --  88 119*  --  73  BUN 45*  --  54* 25*  --  35*  CREATININE 5.98*  --  7.00* 4.44*  --  6.69*  CALCIUM 9.0   < > 9.1 8.6*  --  8.5*  PHOS  --   --   --   --  4.2  --    < > = values in this interval not displayed.    Liver Function Tests: Recent Labs  Lab 02/12/20 1921  AST 19  ALT 21  ALKPHOS 54  BILITOT 1.4*  PROT 7.6  ALBUMIN 4.3   Recent Labs  Lab 02/12/20 1921  LIPASE 26   No results for input(s): AMMONIA in the last 168 hours.  CBC: Recent Labs  Lab 02/12/20 1921 02/14/20 0103 02/15/20 0820  02/16/20 0251  WBC 5.8 4.6 3.8* 4.2  HGB 13.0 13.2 12.5* 12.5*  HCT 37.4* 36.0* 35.3* 33.9*  MCV 94.4 90.7 91.5 88.3  PLT 153 123* 108* 101*    Cardiac Enzymes: No results for input(s): CKTOTAL, CKMB, CKMBINDEX, TROPONINI in the last 168 hours.  BNP: Invalid input(s): POCBNP  CBG: Recent Labs  Lab 02/16/20 0456 02/16/20 0534 02/16/20 0811 02/16/20 1017 02/16/20 1241  GLUCAP 82 80 87 97 90    Microbiology: Results for orders placed or performed during the hospital encounter of 02/13/20  SARS Coronavirus 2 by RT PCR (hospital order, performed in [facility] hospital lab) Nasopharyngeal Nasopharyngeal Swab     Status: None   Collection Time: 02/13/20  7:53 AM   Specimen: Nasopharyngeal Swab  Result Value Ref Range Status   SARS Coronavirus 2 NEGATIVE NEGATIVE Final    Comment: (NOTE) SARS-CoV-2 target nucleic acids are NOT DETECTED.  The SARS-CoV-2 RNA is generally detectable in upper and lower respiratory specimens during the acute phase of infection. The lowest concentration of SARS-CoV-2 viral copies this assay  can detect is 250 copies / mL. A negative result does not preclude SARS-CoV-2 infection and should not be used as the sole basis for treatment or other patient management decisions.  A negative result may occur with improper specimen collection / handling, submission of specimen other than nasopharyngeal swab, presence of viral mutation(s) within the areas targeted by this assay, and inadequate number of viral copies (<250 copies / mL). A negative result must be combined with clinical observations, patient history, and epidemiological information.  Fact Sheet for Patients:   StrictlyIdeas.no  Fact Sheet for Healthcare Providers: BankingDealers.co.za  This test is not yet approved or  cleared by the [place] FDA and has been authorized for detection and/or diagnosis of SARS-CoV-2 by FDA under an Emergency  Use Authorization (EUA).  This EUA will remain in effect (meaning this test can be used) for the duration of the COVID-19 declaration under Section 564(b)(1) of the Act, 21 U.S.C. section 360bbb-3(b)(1), unless the authorization is terminated or revoked sooner.  Performed at [facility], [address], [place], [place] [address]   Surgical PCR screen     Status: None   Collection Time: 02/14/20  1:21 AM   Specimen: Nasal Mucosa; Nasal Swab  Result Value Ref Range Status   MRSA, PCR NEGATIVE NEGATIVE Final   Staphylococcus aureus NEGATIVE NEGATIVE Final    Comment: (NOTE) The Xpert SA Assay (FDA approved for NASAL specimens in patients 54 years of age and older), is one component of a comprehensive surveillance program. It is not intended to diagnose infection nor to guide or monitor treatment. Performed at [facility], [address] [place], [place] [address]     Coagulation Studies: No results for input(s): LABPROT, INR in the last 72 hours.  Urinalysis: No results for input(s): COLORURINE, LABSPEC, PHURINE, GLUCOSEU, HGBUR, BILIRUBINUR, KETONESUR, PROTEINUR, UROBILINOGEN, NITRITE, LEUKOCYTESUR in the last 72 hours.  Invalid input(s): APPERANCEUR    Imaging: No results found.   Medications:   . sodium chloride    . dextrose 25 mL/hr at 02/15/20 0542  . heparin 650 Units/hr (02/16/20 1145)   . amiodarone  100 mg Oral Daily  . chlorhexidine  15 mL Mouth Rinse BID  . chlorhexidine gluconate (MEDLINE KIT)  15 mL Mouth Rinse BID  . [START ON 02/17/2020] levothyroxine  25 mcg Intravenous Q0600  . mouth rinse  15 mL Mouth Rinse q12n4p  . sodium chloride flush  3 mL Intravenous Q12H   sodium chloride, acetaminophen **OR** acetaminophen, albuterol, dextrose, hydrALAZINE, lidocaine-prilocaine, ondansetron (ZOFRAN) IV, polyethylene glycol, sodium chloride flush  Assessment/ Plan:  86 y.o. male with past medical history of ESRD on HD TTS, anemia of  chronic kidney disease, secondary hyperparathyroidism, laryngeal cancer status post tracheostomy placement who was admitted with nausea, vomiting, dysphagia.  [facility]/TTS/LUE AVG  1.  ESRD on HD TTS.  Patient underwent dialysis yesterday.  No acute indication for dialysis today.  We will continue to monitor need for extra dialysis treatments.  2.  Anemia of chronic kidney disease.  Hold off on Epogen at this time as most recent hemoglobin was 12.5.  3.  Secondary hyperparathyroidism.  Continue periodically monitor bone mineral metabolism parameters.   LOS: 3 Kerry [name] 9/12/20212:25 PM

## 2020-02-16 NOTE — Progress Notes (Signed)
ANTICOAGULATION CONSULT NOTE - Initial Consult  Pharmacy Consult for Heparin Infusion Dosing  Indication: atrial fibrillation and DVT  Allergies  Allergen Reactions   Ace Inhibitors Other (See Comments)    Potassium elevated   Gabapentin Other (See Comments)    Ataxia at 100 mg.   Losartan Other (See Comments)    Hyperkalemia    Patient Measurements: Height: 5\' 6"  (167.6 cm) Weight: 58 kg (127 lb 14.4 oz) IBW/kg (Calculated) : 63.8  Heparin dosing wt 57.7kg   Vital Signs: Temp: 98.6 F (37 C) (09/12 1148) Temp Source: Oral (09/12 1148) BP: 121/78 (09/12 1148) Pulse Rate: 78 (09/12 1148)  Labs: Recent Labs    02/14/20 0103 02/14/20 0301 02/15/20 0820 02/15/20 1817 02/16/20 0251 02/16/20 1019 02/16/20 1911  HGB 13.2   < > 12.5*  --  12.5*  --   --   HCT 36.0*  --  35.3*  --  33.9*  --   --   PLT 123*  --  108*  --  101*  --   --   APTT >160*   < > 59*   < > 91* 113* 61*  HEPARINUNFRC  --   --  3.12*  --  >1.80*  --   --   CREATININE 4.44*  --  6.69*  --   --   --   --    < > = values in this interval not displayed.    Estimated Creatinine Clearance: 6.7 mL/min (A) (by C-G formula based on SCr of 6.69 mg/dL (H)).   Medical History: Past Medical History:  Diagnosis Date   Cancer ([place])    CHF (congestive heart failure) (HCC)    COPD (chronic obstructive pulmonary disease) ([place])    Diabetes mellitus without complication ([place])    Hypertension    Renal disorder     Assessment: 86 yo male with PMH of RUE DVT and A. Fib. Patient taking Apixaban (Eliquis) PTA, but presents with difficulty swallowing, nausea vomiting. Pharmacy consulted for heparin infusion dosing and monitoring. Patient reports last dose of apixaban 9/8 AM, but he threw up shortly after. Baseline aPTT, INR, and anti-Xa level elevated.   09/10 2300 aPTT 128 sec supratherapeutic - rate reduced to 550 units/hr 09/11 0820 aPTT 59 sec slightly subtherapeutic (heparin level still elevated  from Eliquis) 09/11 1817 aPTT 62 sec slightly subtherapeutic  09/12 @ 0300 aPTT 91 sec therapeutic x 1  09/12 @ 1019 aPTT 113 sec slightly supratherapeutic (750 units/hr)  09/12 @ 1911 aPTT 61 sec slightly subtherapeutic  Goal of Therapy:  Heparin level 0.3-0.7 units/ml aPTT 66-102 seconds Monitor platelets by anticoagulation protocol: Yes   Plan:  Subtherapeutic   Will order Heparin bolus of 800 units and increase rate to 700 units/hr recheck aPTT and HL at 0500 and continue to monitor.    Mild thrombocytopenia with down ward trend - will monitor plt 153>123>108>101, HGB stable  Paulina Fusi, PharmD, BCPS 02/16/2020 8:35 PM

## 2020-02-16 NOTE — Progress Notes (Signed)
ANTICOAGULATION CONSULT NOTE - Initial Consult  Pharmacy Consult for Heparin Infusion Dosing  Indication: atrial fibrillation and DVT  Allergies  Allergen Reactions  . Ace Inhibitors Other (See Comments)    Potassium elevated  . Gabapentin Other (See Comments)    Ataxia at 100 mg.  . Losartan Other (See Comments)    Hyperkalemia    Patient Measurements: Height: 5\' 6"  (167.6 cm) Weight: 58 kg (127 lb 14.4 oz) IBW/kg (Calculated) : 63.8  Heparin dosing wt 57.7kg   Vital Signs: Temp: 98.5 F (36.9 C) (09/11 2040) Temp Source: Oral (09/11 2040) BP: 116/81 (09/11 2040) Pulse Rate: 73 (09/11 2040)  Labs: Recent Labs    02/13/20 1007 02/13/20 1009 02/13/20 1009 02/14/20 0103 02/14/20 0301 02/15/20 0820 02/15/20 1817 02/16/20 0251  HGB  --   --   --  13.2   < > 12.5*  --  12.5*  HCT  --   --   --  36.0*  --  35.3*  --  33.9*  PLT  --   --   --  123*  --  108*  --  101*  APTT  --  51*   < > >160*   < > 59* 62* 91*  LABPROT  --  20.4*  --   --   --   --   --   --   INR  --  1.8*  --   --   --   --   --   --   HEPARINUNFRC  --  >3.60*  --   --   --  3.12*  --   --   CREATININE 7.00*  --   --  4.44*  --  6.69*  --   --    < > = values in this interval not displayed.    Estimated Creatinine Clearance: 6.7 mL/min (A) (by C-G formula based on SCr of 6.69 mg/dL (H)).   Medical History: Past Medical History:  Diagnosis Date  . Cancer ([place])   . CHF (congestive heart failure) ([place])   . COPD (chronic obstructive pulmonary disease) ([place])   . Diabetes mellitus without complication ([place])   . Hypertension   . Renal disorder     Assessment: 86 yo male with PMH of RUE DVT and A. Fib. Patient taking Apixaban (Eliquis) PTA, but presents with difficulty swallowing, nausea vomiting. Pharmacy consulted for heparin infusion dosing and monitoring. Patient reports last dose of apixaban 9/8 AM, but he threw up shortly after. Baseline aPTT, INR, and anti-Xa level elevated.   09/10  2300 aPTT 128 sec supratherapeutic - rate reduced to 550 units/hr 09/11 0820 aPTT 59 sec slightly subtherapeutic (heparin level still elevated from Eliquis) 09/11 1817 aPTT 62 sec slightly subtherapeutic  Goal of Therapy:  Heparin level 0.3-0.7 units/ml aPTT 66-102 seconds Monitor platelets by anticoagulation protocol: Yes   Plan:  09/12 @ 0300 aPTT 91 seconds therapeutic. Will continue current rate and will recheck aPTT at 0900 and continue to monitor.  Tobie Lords, PharmD, BCPS Clinical Pharmacist 02/16/2020 4:12 AM

## 2020-02-16 NOTE — Progress Notes (Addendum)
ANTICOAGULATION CONSULT NOTE - Initial Consult  Pharmacy Consult for Heparin Infusion Dosing  Indication: atrial fibrillation and DVT  Allergies  Allergen Reactions  . Ace Inhibitors Other (See Comments)    Potassium elevated  . Gabapentin Other (See Comments)    Ataxia at 100 mg.  . Losartan Other (See Comments)    Hyperkalemia    Patient Measurements: Height: 5\' 6"  (167.6 cm) Weight: 58 kg (127 lb 14.4 oz) IBW/kg (Calculated) : 63.8  Heparin dosing wt 57.7kg   Vital Signs: Temp: 97.8 F (36.6 C) (09/12 0810) Temp Source: Oral (09/12 0810) BP: 120/89 (09/12 0810) Pulse Rate: 59 (09/12 0810)  Labs: Recent Labs    02/14/20 0103 02/14/20 0301 02/15/20 0820 02/15/20 0820 02/15/20 1817 02/16/20 0251 02/16/20 1019  HGB 13.2   < > 12.5*  --   --  12.5*  --   HCT 36.0*  --  35.3*  --   --  33.9*  --   PLT 123*  --  108*  --   --  101*  --   APTT >160*   < > 59*   < > 62* 91* 113*  HEPARINUNFRC  --   --  3.12*  --   --  >1.80*  --   CREATININE 4.44*  --  6.69*  --   --   --   --    < > = values in this interval not displayed.    Estimated Creatinine Clearance: 6.7 mL/min (A) (by C-G formula based on SCr of 6.69 mg/dL (H)).   Medical History: Past Medical History:  Diagnosis Date  . Cancer ([place])   . CHF (congestive heart failure) ([place])   . COPD (chronic obstructive pulmonary disease) ([place])   . Diabetes mellitus without complication ([place])   . Hypertension   . Renal disorder     Assessment: 86 yo male with PMH of RUE DVT and A. Fib. Patient taking Apixaban (Eliquis) PTA, but presents with difficulty swallowing, nausea vomiting. Pharmacy consulted for heparin infusion dosing and monitoring. Patient reports last dose of apixaban 9/8 AM, but he threw up shortly after. Baseline aPTT, INR, and anti-Xa level elevated.   09/10 2300 aPTT 128 sec supratherapeutic - rate reduced to 550 units/hr 09/11 0820 aPTT 59 sec slightly subtherapeutic (heparin level still elevated  from Eliquis) 09/11 1817 aPTT 62 sec slightly subtherapeutic  09/12 @ 0300 aPTT 91 sec therapeutic x 1  09/12 @ 1019 aPTT 113 sec slightly supratherapeutic (750 units/hr)  Goal of Therapy:  Heparin level 0.3-0.7 units/ml aPTT 66-102 seconds Monitor platelets by anticoagulation protocol: Yes   Plan:  Supratherapeutic   Will reduce rate back to 650 units/hr recheck aPTT at 1900 and continue to monitor.    Mild thrombocytopenia with down ward trend - will monitor plt 153>123>108>101, HGB stable  Lu Duffel, PharmD, BCPS Clinical Pharmacist 02/16/2020 11:07 AM

## 2020-02-16 NOTE — Progress Notes (Signed)
Vonda Antigua, MD [address] [place], [place], [place], [address] [address], [place], [place], [place], [address] Phone: [phone]  Fax: [phone]   Subjective: Patient sitting up with wife at bedside.  Appears comfortable.  No complaints.   Objective: Exam: Vital signs in last 24 hours: Vitals:   02/15/20 2040 02/16/20 0532 02/16/20 0810 02/16/20 1148  BP: 116/81 114/69 120/89 121/78  Pulse: 73 68 (!) 59 78  Resp: _0 Temp: 98.5 F (36.9 C) 98.7 F (37.1 C) 97.8 F (36.6 C) 98.6 F (37 C)  TempSrc: Oral Oral Oral Oral  SpO2:  100% 100% 100%  Weight:      Height:       Weight change:   Intake/Output Summary (Last 24 hours) at 02/16/2020 1625 Last data filed at 02/16/2020 1546 Gross per 24 hour  Intake --  Output 0 ml  Net 0 ml    General: No acute distress, AAO x3 Abd: Soft, NT/ND, No HSM Skin: Warm, no rashes Neck: Supple, Trachea midline   Lab Results: Lab Results  Component Value Date   WBC 4.2 02/16/2020   HGB 12.5 (L) 02/16/2020   HCT 33.9 (L) 02/16/2020   MCV 88.3 02/16/2020   PLT 101 (L) 02/16/2020   Micro Results: Recent Results (from the past 240 hour(s))  SARS Coronavirus 2 by RT PCR (hospital order, performed in [place] hospital lab) Nasopharyngeal Nasopharyngeal Swab     Status: None   Collection Time: 02/13/20  7:53 AM   Specimen: Nasopharyngeal Swab  Result Value Ref Range Status   SARS Coronavirus 2 NEGATIVE NEGATIVE Final    Comment: (NOTE) SARS-CoV-2 target nucleic acids are NOT DETECTED.  The SARS-CoV-2 RNA is generally detectable in upper and lower respiratory specimens during the acute phase of infection. The lowest concentration of SARS-CoV-2 viral copies this assay can detect is 250 copies / mL. A negative result does not preclude SARS-CoV-2 infection and should not be used as the sole basis for treatment or other patient management decisions.  A negative result may occur with improper  specimen collection / handling, submission of specimen other than nasopharyngeal swab, presence of viral mutation(s) within the areas targeted by this assay, and inadequate number of viral copies (<250 copies / mL). A negative result must be combined with clinical observations, patient history, and epidemiological information.  Fact Sheet for Patients:   StrictlyIdeas.no  Fact Sheet for Healthcare Providers: BankingDealers.co.za  This test is not yet approved or  cleared by the [place] FDA and has been authorized for detection and/or diagnosis of SARS-CoV-2 by FDA under an Emergency Use Authorization (EUA).  This EUA will remain in effect (meaning this test can be used) for the duration of the COVID-19 declaration under Section 564(b)(1) of the Act, 21 U.S.C. section 360bbb-3(b)(1), unless the authorization is terminated or revoked sooner.  Performed at [facility], [address] [place], [place] [address]   Surgical PCR screen     Status: None   Collection Time: 02/14/20  1:21 AM   Specimen: Nasal Mucosa; Nasal Swab  Result Value Ref Range Status   MRSA, PCR NEGATIVE NEGATIVE Final   Staphylococcus aureus NEGATIVE NEGATIVE Final    Comment: (NOTE) The Xpert SA Assay (FDA approved for NASAL specimens in patients 76 years of age and older), is one component of a comprehensive surveillance program. It is not intended to diagnose infection nor to guide or monitor treatment. Performed at [facility],  [place], [place] [address]    Studies/Results: No results found. Medications:  Scheduled Meds: . amiodarone  100 mg Oral Daily  . chlorhexidine  15 mL Mouth Rinse BID  . chlorhexidine gluconate (MEDLINE KIT)  15 mL Mouth Rinse BID  . [START ON 02/17/2020] levothyroxine  25 mcg Intravenous Q0600  . mouth rinse  15 mL Mouth Rinse q12n4p  . sodium chloride flush  3 mL Intravenous Q12H    Continuous Infusions: . sodium chloride    . dextrose 25 mL/hr at 02/15/20 0542  . heparin 650 Units/hr (02/16/20 1145)   PRN Meds:.sodium chloride, acetaminophen **OR** acetaminophen, albuterol, dextrose, hydrALAZINE, lidocaine-prilocaine, ondansetron (ZOFRAN) IV, polyethylene glycol, sodium chloride flush   Assessment: Principal Problem:   Difficulty swallowing Active Problems:   Anemia of chronic disease   Coronary artery disease   Hyperlipidemia   Hypertension   Squamous cell cancer of hypopharynx (HCC)   Acute deep vein thrombosis (DVT) of right upper extremity (HCC)   AF (paroxysmal atrial fibrillation) (HCC)   Chronic systolic CHF (congestive heart failure) (HCC)   ESRD (end stage renal disease) (HCC)   Hypoglycemia   COPD (chronic obstructive pulmonary disease) (HCC)   Type II diabetes mellitus with renal manifestations ([place])    Plan: Wife tells me that she had discussion with Dr. Marius Ditch last week about plans for endoscopy.  As per Dr. Marius Ditch signout and notes, this is planned for tomorrow  Patient code potentially be scheduled for tomorrow, however, he will need to be assessed by on-call GI for the week, Dr. Vicente Males and discussed with anesthesia.  Patient and wife agreeable with this plan.  Pt is NPO   LOS: 3 days   Vonda Antigua, MD 02/16/2020, 4:25 PM

## 2020-02-17 DIAGNOSIS — R1310 Dysphagia, unspecified: Secondary | ICD-10-CM

## 2020-02-17 LAB — GLUCOSE, CAPILLARY
Glucose-Capillary: 110 mg/dL — ABNORMAL HIGH (ref 70–99)
Glucose-Capillary: 128 mg/dL — ABNORMAL HIGH (ref 70–99)
Glucose-Capillary: 127 mg/dL — ABNORMAL HIGH (ref 70–99)
Glucose-Capillary: 142 mg/dL — ABNORMAL HIGH (ref 70–99)
Glucose-Capillary: 172 mg/dL — ABNORMAL HIGH (ref 70–99)
Glucose-Capillary: 166 mg/dL — ABNORMAL HIGH (ref 70–99)
Glucose-Capillary: 156 mg/dL — ABNORMAL HIGH (ref 70–99)
Glucose-Capillary: 174 mg/dL — ABNORMAL HIGH (ref 70–99)
Glucose-Capillary: 152 mg/dL — ABNORMAL HIGH (ref 70–99)

## 2020-02-17 LAB — CBC
HCT: 33.4 % — ABNORMAL LOW (ref 39.0–52.0)
Hemoglobin: 12.1 g/dL — ABNORMAL LOW (ref 13.0–17.0)
MCH: 32.4 pg (ref 26.0–34.0)
MCHC: 36.2 g/dL — ABNORMAL HIGH (ref 30.0–36.0)
MCV: 89.5 fL (ref 80.0–100.0)
Platelets: 95 10*3/uL — ABNORMAL LOW (ref 150–400)
RBC: 3.73 MIL/uL — ABNORMAL LOW (ref 4.22–5.81)
RDW: 16 % — ABNORMAL HIGH (ref 11.5–15.5)
WBC: 3.8 10*3/uL — ABNORMAL LOW (ref 4.0–10.5)
nRBC: 0 % (ref 0.0–0.2)

## 2020-02-17 LAB — BASIC METABOLIC PANEL
Anion gap: 13 (ref 5–15)
BUN: 26 mg/dL — ABNORMAL HIGH (ref 8–23)
CO2: 26 mmol/L (ref 22–32)
Calcium: 8.6 mg/dL — ABNORMAL LOW (ref 8.9–10.3)
Chloride: 95 mmol/L — ABNORMAL LOW (ref 98–111)
Creatinine, Ser: 6.12 mg/dL — ABNORMAL HIGH (ref 0.61–1.24)
GFR calc Af Amer: 9 mL/min — ABNORMAL LOW (ref >60)
GFR calc non Af Amer: 8 mL/min — ABNORMAL LOW (ref >60)
Glucose, Bld: 115 mg/dL — ABNORMAL HIGH (ref 70–99)
Potassium: 3.3 mmol/L — ABNORMAL LOW (ref 3.5–5.1)
Sodium: 134 mmol/L — ABNORMAL LOW (ref 135–145)

## 2020-02-17 LAB — MAGNESIUM: Magnesium: 1.9 mg/dL (ref 1.7–2.4)

## 2020-02-17 LAB — APTT
aPTT: 141 seconds — ABNORMAL HIGH (ref 24–36)
aPTT: 61 seconds — ABNORMAL HIGH (ref 24–36)

## 2020-02-17 LAB — HEPARIN LEVEL (UNFRACTIONATED): Heparin Unfractionated: 1.8 IU/mL — ABNORMAL HIGH (ref 0.30–0.70)

## 2020-02-17 MED ORDER — HEPARIN (PORCINE) 25000 UT/250ML-% IV SOLN: 650.0000 [IU]/h | INTRAVENOUS | Status: DC

## 2020-02-17 MED ORDER — POTASSIUM CHLORIDE 10 MEQ/100ML IV SOLN
10.0000 meq | INTRAVENOUS | Status: AC
  Administered 2020-02-17: 10 meq via INTRAVENOUS
  Filled 2020-02-17 (×2): qty 100

## 2020-02-17 MED ORDER — POTASSIUM CHLORIDE 10 MEQ/100ML IV SOLN
10.0000 meq | INTRAVENOUS | Status: DC
  Filled 2020-02-17 (×4): qty 100

## 2020-02-17 NOTE — Progress Notes (Addendum)
ANTICOAGULATION CONSULT NOTE - Initial Consult  Pharmacy Consult for Heparin Infusion Dosing  Indication: atrial fibrillation and DVT  Allergies  Allergen Reactions  . Ace Inhibitors Other (See Comments)    Potassium elevated  . Gabapentin Other (See Comments)    Ataxia at 100 mg.  . Losartan Other (See Comments)    Hyperkalemia    Patient Measurements: Height: 5\' 6"  (167.6 cm) Weight: 58 kg (127 lb 14.4 oz) IBW/kg (Calculated) : 63.8  Heparin dosing wt 57.7kg   Vital Signs: Temp: 98.1 F (36.7 C) (09/13 1107) Temp Source: Oral (09/13 1107) BP: 118/75 (09/13 1107) Pulse Rate: 67 (09/13 1107)  Labs: Recent Labs    02/15/20 0820 02/15/20 1817 02/16/20 0251 02/16/20 1019 02/16/20 1911 02/17/20 0454 02/17/20 1342  HGB 12.5*  --  12.5*  --   --  12.1*  --   HCT 35.3*  --  33.9*  --   --  33.4*  --   PLT 108*  --  101*  --   --  95*  --   APTT 59*   < > 91*   < > 61* 141* 61*  HEPARINUNFRC 3.12*  --  >1.80*  --   --  1.80*  --   CREATININE 6.69*  --   --   --   --  6.12*  --    < > = values in this interval not displayed.    Estimated Creatinine Clearance: 7.4 mL/min (A) (by C-G formula based on SCr of 6.12 mg/dL (H)).   Medical History: Past Medical History:  Diagnosis Date  . Cancer ([place])   . CHF (congestive heart failure) ([place])   . COPD (chronic obstructive pulmonary disease) ([place])   . Diabetes mellitus without complication ([place])   . Hypertension   . Renal disorder     Assessment: 86 yo male with PMH of RUE DVT and A. Fib (CHADSVASC 6). Patient taking Apixaban (Eliquis) PTA, but presents with difficulty swallowing, nausea/vomiting. Pharmacy consulted for heparin infusion dosing and monitoring. Patient reports last dose of apixaban 9/8 AM, but he threw up shortly after. Baseline aPTT, INR, and anti-Xa level elevated. Holding Eliquis d/t planned EGD.   09/10 2300 aPTT 128 sec supratherapeutic - rate reduced to 550 units/hr 09/11 0820 aPTT 59 sec slightly  subtherapeutic (heparin level still elevated from Eliquis) 09/11 1817 aPTT 62 sec slightly subtherapeutic  09/12 @ 0300 aPTT 91 sec therapeutic x 1  09/12 @ 1019 aPTT 113 sec slightly supratherapeutic (750 units/hr)  09/12 @ 1911 aPTT 61 sec slightly subtherapeutic  09/13 @ 0454 aPTT 141 seconds, HL 1.80 both supratherapeutic and non correlative.   09/13 @1342  aPTT 61 seconds, subtherapeutic  Goal of Therapy:  Heparin level 0.3-0.7 units/ml once HL and aPTT correlate aPTT 66-102 seconds Monitor platelets by anticoagulation protocol: Yes   Plan:  APTT was subtherapeutic. Will increase rate to 650 units/hr and will not bolus at this time due to previous elevated aPTTs. If patient remains subtherapeutic, recommend bolus. Will recheck aPTT in 8 hours and HL and CBC w/ am labs, plts trending down slightly, continue to monitor.   Sherilyn Banker, PharmD Pharmacy Resident  02/18/2020 8:55 AM

## 2020-02-17 NOTE — Progress Notes (Signed)
[facility]  ROUNDING NOTE   Subjective:  Patient resting in bed, wife at the bedside. Supplemental O2 10L  on flow via trach collar.   Objective:  Vital signs in last 24 hours:  Temp:  [98 F (36.7 C)-98.6 F (37 C)] 98 F (36.7 C) (09/13 0732) Pulse Rate:  [67-79] 67 (09/13 0732) Resp:  [16-18] 18 (09/13 0732) BP: (111-121)/(62-78) 116/68 (09/13 0732) SpO2:  [100 %] 100 % (09/13 0732)  Weight change:  Filed Weights   02/13/20 1703 02/14/20 0100  Weight: 57.7 kg 58 kg    Intake/Output: I/O last 3 completed shifts: In: 956.1 [I.V.:956.1] Out: 0    Intake/Output this shift:  Total I/O In: 73.8 [I.V.:73.8] Out: -   Physical Exam: General:  Resting in bed, in no acute distress  Head:  Normocephalic, atraumatic. Moist oral mucosal membranes  Eyes:  Sclerae and conjunctivae clear  Neck:  Tracheostomy collar on   Lungs:   Fine crackles + bilaterally, normal and symmetrical effort  Heart:  S1S2 no rubs  Abdomen:   Soft, nontender, non distended  Extremities:  No peripheral edema.  Neurologic:  Awake, alert, following commands  Skin:  No acute lesions or rashes  Access:  Left upper extremity AV graft    Basic Metabolic Panel: Recent Labs  Lab 02/12/20 1921 02/12/20 1921 02/13/20 1007 02/13/20 1007 02/14/20 0103 02/14/20 2254 02/15/20 0820 02/17/20 0454  NA 142  --  141  --  139  --  134* 134*  K 4.3  --  4.5  --  3.7  --  3.9 3.3*  CL 99  --  98  --  97*  --  96* 95*  CO2 26  --  25  --  29  --  23 26  GLUCOSE 55*  --  88  --  119*  --  73 115*  BUN 45*  --  54*  --  25*  --  35* 26*  CREATININE 5.98*  --  7.00*  --  4.44*  --  6.69* 6.12*  CALCIUM 9.0   < > 9.1   < > 8.6*  --  8.5* 8.6*  MG  --   --   --   --   --   --   --  1.9  PHOS  --   --   --   --   --  4.2  --   --    < > = values in this interval not displayed.    Liver Function Tests: Recent Labs  Lab 02/12/20 1921  AST 19  ALT 21  ALKPHOS 54  BILITOT 1.4*  PROT 7.6   ALBUMIN 4.3   Recent Labs  Lab 02/12/20 1921  LIPASE 26   No results for input(s): AMMONIA in the last 168 hours.  CBC: Recent Labs  Lab 02/12/20 1921 02/14/20 0103 02/15/20 0820 02/16/20 0251 02/17/20 0454  WBC 5.8 4.6 3.8* 4.2 3.8*  HGB 13.0 13.2 12.5* 12.5* 12.1*  HCT 37.4* 36.0* 35.3* 33.9* 33.4*  MCV 94.4 90.7 91.5 88.3 89.5  PLT 153 123* 108* 101* 95*    Cardiac Enzymes: No results for input(s): CKTOTAL, CKMB, CKMBINDEX, TROPONINI in the last 168 hours.  BNP: Invalid input(s): POCBNP  CBG: Recent Labs  Lab 02/16/20 2346 02/17/20 0214 02/17/20 0410 02/17/20 0611 02/17/20 0733  GLUCAP 103* 110* 128* 127* 142*    Microbiology: Results for orders placed or performed during the hospital encounter of 02/13/20  SARS  Coronavirus 2 by RT PCR (hospital order, performed in [facility] hospital lab) Nasopharyngeal Nasopharyngeal Swab     Status: None   Collection Time: 02/13/20  7:53 AM   Specimen: Nasopharyngeal Swab  Result Value Ref Range Status   SARS Coronavirus 2 NEGATIVE NEGATIVE Final    Comment: (NOTE) SARS-CoV-2 target nucleic acids are NOT DETECTED.  The SARS-CoV-2 RNA is generally detectable in upper and lower respiratory specimens during the acute phase of infection. The lowest concentration of SARS-CoV-2 viral copies this assay can detect is 250 copies / mL. A negative result does not preclude SARS-CoV-2 infection and should not be used as the sole basis for treatment or other patient management decisions.  A negative result may occur with improper specimen collection / handling, submission of specimen other than nasopharyngeal swab, presence of viral mutation(s) within the areas targeted by this assay, and inadequate number of viral copies (<250 copies / mL). A negative result must be combined with clinical observations, patient history, and epidemiological information.  Fact Sheet for Patients:    StrictlyIdeas.no  Fact Sheet for Healthcare Providers: BankingDealers.co.za  This test is not yet approved or  cleared by the [place] FDA and has been authorized for detection and/or diagnosis of SARS-CoV-2 by FDA under an Emergency Use Authorization (EUA).  This EUA will remain in effect (meaning this test can be used) for the duration of the COVID-19 declaration under Section 564(b)(1) of the Act, 21 U.S.C. section 360bbb-3(b)(1), unless the authorization is terminated or revoked sooner.  Performed at [facility], [address], [place], [place] [address]   Surgical PCR screen     Status: None   Collection Time: 02/14/20  1:21 AM   Specimen: Nasal Mucosa; Nasal Swab  Result Value Ref Range Status   MRSA, PCR NEGATIVE NEGATIVE Final   Staphylococcus aureus NEGATIVE NEGATIVE Final    Comment: (NOTE) The Xpert SA Assay (FDA approved for NASAL specimens in patients 25 years of age and older), is one component of a comprehensive surveillance program. It is not intended to diagnose infection nor to guide or monitor treatment. Performed at [facility], [address] [place], [place] [address]     Coagulation Studies: No results for input(s): LABPROT, INR in the last 72 hours.  Urinalysis: No results for input(s): COLORURINE, LABSPEC, PHURINE, GLUCOSEU, HGBUR, BILIRUBINUR, KETONESUR, PROTEINUR, UROBILINOGEN, NITRITE, LEUKOCYTESUR in the last 72 hours.  Invalid input(s): APPERANCEUR    Imaging: No results found.   Medications:   . sodium chloride    . dextrose 50 mL/hr at 02/17/20 0708  . heparin 550 Units/hr (02/17/20 0851)   . amiodarone  100 mg Oral Daily  . chlorhexidine  15 mL Mouth Rinse BID  . chlorhexidine gluconate (MEDLINE KIT)  15 mL Mouth Rinse BID  . levothyroxine  25 mcg Intravenous Q0600  . mouth rinse  15 mL Mouth Rinse q12n4p  . sodium chloride flush  3 mL Intravenous Q12H    sodium chloride, acetaminophen **OR** acetaminophen, albuterol, dextrose, hydrALAZINE, lidocaine-prilocaine, ondansetron (ZOFRAN) IV, polyethylene glycol, sodium chloride flush  Assessment/ Plan:  86 y.o. male with past medical history of ESRD on HD TTS, anemia of chronic kidney disease, secondary hyperparathyroidism, laryngeal cancer status post tracheostomy placement who was admitted with nausea, vomiting, dysphagia.  [facility]/TTS/LUE AVG  1.  ESRD on HD TTS.  Volume and electrolyte status acceptable  No additional dialysis required today Will continue TTS schedule if patient is still hospitalized tomorrow  2.  Anemia of chronic kidney disease.   Hgb level at goal, 12.1 today No need for Epogen  3.  Secondary hyperparathyroidism.  Continue periodically monitor bone mineral metabolism parameters.   LOS: 4 Sadia Belfiore 9/13/202110:47 AM

## 2020-02-17 NOTE — Progress Notes (Signed)
PT Cancellation Note  Patient Details Name: Hanan Moen MRN: [id number] DOB: 1933-10-12   Cancelled Treatment:    Reason Eval/Treat Not Completed: Patient at procedure or test/unavailable (Patient consult received and reviewed. Patient not available at time of attempt; medical staffing in room. Will attempt again at later time/date as available/appropriate)  Janna Arch, PT, DPT   02/17/2020, 12:49 PM

## 2020-02-17 NOTE — Care Management Important Message (Signed)
Important Message  Patient Details  Name: Kerry [name] MRN: [id number] Date of Birth: 1933/07/18   Medicare Important Message Given:  Yes     Dannette Barbara 02/17/2020, 2:26 PM

## 2020-02-17 NOTE — Progress Notes (Signed)
ANTICOAGULATION CONSULT NOTE - Initial Consult  Pharmacy Consult for Heparin Infusion Dosing  Indication: atrial fibrillation and DVT  Allergies  Allergen Reactions  . Ace Inhibitors Other (See Comments)    Potassium elevated  . Gabapentin Other (See Comments)    Ataxia at 100 mg.  . Losartan Other (See Comments)    Hyperkalemia    Patient Measurements: Height: 5\' 6"  (167.6 cm) Weight: 58 kg (127 lb 14.4 oz) IBW/kg (Calculated) : 63.8  Heparin dosing wt 57.7kg   Vital Signs: Temp: 98.5 F (36.9 C) (09/13 0415) Temp Source: Oral (09/13 0415) BP: 114/72 (09/13 0415) Pulse Rate: 76 (09/13 0415)  Labs: Recent Labs    02/15/20 0820 02/15/20 1817 02/16/20 0251 02/16/20 0251 02/16/20 1019 02/16/20 1911 02/17/20 0454  HGB 12.5*  --  12.5*  --   --   --  12.1*  HCT 35.3*  --  33.9*  --   --   --  33.4*  PLT 108*  --  101*  --   --   --  95*  APTT 59*   < > 91*   < > 113* 61* 141*  HEPARINUNFRC 3.12*  --  >1.80*  --   --   --  1.80*  CREATININE 6.69*  --   --   --   --   --  6.12*   < > = values in this interval not displayed.    Estimated Creatinine Clearance: 7.4 mL/min (A) (by C-G formula based on SCr of 6.12 mg/dL (H)).   Medical History: Past Medical History:  Diagnosis Date  . Cancer ([place])   . CHF (congestive heart failure) ([place])   . COPD (chronic obstructive pulmonary disease) ([place])   . Diabetes mellitus without complication ([place])   . Hypertension   . Renal disorder     Assessment: 86 yo male with PMH of RUE DVT and A. Fib. Patient taking Apixaban (Eliquis) PTA, but presents with difficulty swallowing, nausea vomiting. Pharmacy consulted for heparin infusion dosing and monitoring. Patient reports last dose of apixaban 9/8 AM, but he threw up shortly after. Baseline aPTT, INR, and anti-Xa level elevated.   09/10 2300 aPTT 128 sec supratherapeutic - rate reduced to 550 units/hr 09/11 0820 aPTT 59 sec slightly subtherapeutic (heparin level still elevated  from Eliquis) 09/11 1817 aPTT 62 sec slightly subtherapeutic  09/12 @ 0300 aPTT 91 sec therapeutic x 1  09/12 @ 1019 aPTT 113 sec slightly supratherapeutic (750 units/hr)  09/12 @ 1911 aPTT 61 sec slightly subtherapeutic  Goal of Therapy:  Heparin level 0.3-0.7 units/ml aPTT 66-102 seconds Monitor platelets by anticoagulation protocol: Yes   Plan:  09/13 @ 0454 aPTT 141 seconds, HL 1.80 both supratherapeutic and non correlative. Will hold heparin drip until 0830 and restart at 550 units/hr and will recheck aPTT at 1300 and HL w/ am labs, plts trending down slightly, CBC grossly stable will continue to monitor.  Tobie Lords, PharmD, BCPS Clinical Pharmacist 02/17/2020 7:07 AM

## 2020-02-17 NOTE — Progress Notes (Signed)
[facility] Cardiology    SUBJECTIVE: Patient doing reasonably well denies any chest pain resting comfortably preop for pneumatic dilatation in the morning   Vitals:   02/16/20 2035 02/17/20 0415 02/17/20 0732 02/17/20 1107  BP: 111/62 114/72 116/68 118/75  Pulse: 79 76 67 67  Resp: 18 16 18 18   Temp: 98.4 F (36.9 C) 98.5 F (36.9 C) 98 F (36.7 C) 98.1 F (36.7 C)  TempSrc: Oral Oral Oral Oral  SpO2: 100% 100% 100% 100%  Weight:      Height:         Intake/Output Summary (Last 24 hours) at 02/17/2020 1353 Last data filed at 02/17/2020 1328 Gross per 24 hour  Intake 1369.88 ml  Output 0 ml  Net 1369.88 ml      PHYSICAL EXAM  General: Well developed, well nourished, in no acute distress HEENT:  Normocephalic and atramatic Neck:  No JVD.  Lungs: Clear bilaterally to auscultation and percussion. Heart: HRRR . Normal S1 and S2 without gallops or murmurs.  Abdomen: Bowel sounds are positive, abdomen soft and non-tender  Msk:  Back normal, normal gait. Normal strength and tone for age. Extremities: No clubbing, cyanosis or edema.   Neuro: Alert and oriented X 3. Psych:  Good affect, responds appropriately   LABS: Basic Metabolic Panel: Recent Labs    02/14/20 2254 02/15/20 0820 02/17/20 0454  NA  --  134* 134*  K  --  3.9 3.3*  CL  --  96* 95*  CO2  --  23 26  GLUCOSE  --  73 115*  BUN  --  35* 26*  CREATININE  --  6.69* 6.12*  CALCIUM  --  8.5* 8.6*  MG  --   --  1.9  PHOS 4.2  --   --    Liver Function Tests: No results for input(s): AST, ALT, ALKPHOS, BILITOT, PROT, ALBUMIN in the last 72 hours. No results for input(s): LIPASE, AMYLASE in the last 72 hours. CBC: Recent Labs    02/16/20 0251 02/17/20 0454  WBC 4.2 3.8*  HGB 12.5* 12.1*  HCT 33.9* 33.4*  MCV 88.3 89.5  PLT 101* 95*   Cardiac Enzymes: No results for input(s): CKTOTAL, CKMB, CKMBINDEX, TROPONINI in the last 72 hours. BNP: Invalid input(s): POCBNP D-Dimer: No results for input(s):  DDIMER in the last 72 hours. Hemoglobin A1C: No results for input(s): HGBA1C in the last 72 hours. Fasting Lipid Panel: No results for input(s): CHOL, HDL, LDLCALC, TRIG, CHOLHDL, LDLDIRECT in the last 72 hours. Thyroid Function Tests: No results for input(s): TSH, T4TOTAL, T3FREE, THYROIDAB in the last 72 hours.  Invalid input(s): FREET3 Anemia Panel: No results for input(s): VITAMINB12, FOLATE, FERRITIN, TIBC, IRON, RETICCTPCT in the last 72 hours.  No results found.   Echo SEVERELY DEPRESSED LVF EF=25%  TELEMETRY: NSR 72 NSSTW:  ASSESSMENT AND PLAN:  Principal Problem:   Difficulty swallowing Active Problems:   Anemia of chronic disease   Coronary artery disease   Hyperlipidemia   Hypertension   Squamous cell cancer of hypopharynx (HCC)   Acute deep vein thrombosis (DVT) of right upper extremity (HCC)   AF (paroxysmal atrial fibrillation) (HCC)   Chronic systolic CHF (congestive heart failure) (HCC)   ESRD (end stage renal disease) (HCC)   Hypoglycemia   COPD (chronic obstructive pulmonary disease) (HCC)   Type II diabetes mellitus with renal manifestations ([place])    Plan Continue current medical therapy We will proceed with pneumatic dilatation probably tomorrow Agree with hemodialysis  as scheduled tomorrow Continue DVT prophylaxis Agree with supplemental oxygen therapy for COPD Continue diabetes management and control Continue rate control for atrial fibrillation Maintain hyperlipidemia therapy   Yolonda Kida, MD 02/17/2020 1:53 PM

## 2020-02-17 NOTE — Progress Notes (Signed)
Kerry [name] , MD [address], [place], [place], [place], [address] [address], [place], [place], [place], [address] Phone: [phone]  Fax: [phone]   Kerry [name] is being followed for dysphagia   Subjective: Can swallow some liquids but no solids at all   Objective: Vital signs in last 24 hours: Vitals:   02/16/20 2035 02/17/20 0415 02/17/20 0732 02/17/20 1107  BP: 111/62 114/72 116/68 118/75  Pulse: 79 76 67 67  Resp: _0 Temp: 98.4 F (36.9 C) 98.5 F (36.9 C) 98 F (36.7 C) 98.1 F (36.7 C)  TempSrc: Oral Oral Oral Oral  SpO2: 100% 100% 100% 100%  Weight:      Height:       Weight change:   Intake/Output Summary (Last 24 hours) at 02/17/2020 1215 Last data filed at 02/17/2020 2707 Gross per 24 hour  Intake 1029.96 ml  Output 0 ml  Net 1029.96 ml     Exam:  Abdomen: soft, nontender, normal bowel sounds   Lab Results: _1 @ Micro Results: Recent Results (from the past 240 hour(s))  SARS Coronavirus 2 by RT PCR (hospital order, performed in [place] hospital lab) Nasopharyngeal Nasopharyngeal Swab     Status: None   Collection Time: 02/13/20  7:53 AM   Specimen: Nasopharyngeal Swab  Result Value Ref Range Status   SARS Coronavirus 2 NEGATIVE NEGATIVE Final    Comment: (NOTE) SARS-CoV-2 target nucleic acids are NOT DETECTED.  The SARS-CoV-2 RNA is generally detectable in upper and lower respiratory specimens during the acute phase of infection. The lowest concentration of SARS-CoV-2 viral copies this assay can detect is 250 copies / mL. A negative result does not preclude SARS-CoV-2 infection and should not be used as the sole basis for treatment or other patient management decisions.  A negative result may occur with improper specimen collection / handling, submission of specimen other than nasopharyngeal swab, presence of viral mutation(s) within the areas targeted by this assay, and inadequate number of viral  copies (<250 copies / mL). A negative result must be combined with clinical observations, patient history, and epidemiological information.  Fact Sheet for Patients:   StrictlyIdeas.no  Fact Sheet for Healthcare Providers: BankingDealers.co.za  This test is not yet approved or  cleared by the [place] FDA and has been authorized for detection and/or diagnosis of SARS-CoV-2 by FDA under an Emergency Use Authorization (EUA).  This EUA will remain in effect (meaning this test can be used) for the duration of the COVID-19 declaration under Section 564(b)(1) of the Act, 21 U.S.C. section 360bbb-3(b)(1), unless the authorization is terminated or revoked sooner.  Performed at [facility], [address] [place], [place] [address]   Surgical PCR screen     Status: None   Collection Time: 02/14/20  1:21 AM   Specimen: Nasal Mucosa; Nasal Swab  Result Value Ref Range Status   MRSA, PCR NEGATIVE NEGATIVE Final   Staphylococcus aureus NEGATIVE NEGATIVE Final    Comment: (NOTE) The Xpert SA Assay (FDA approved for NASAL specimens in patients 18 years of age and older), is one component of a comprehensive surveillance program. It is not intended to diagnose infection nor to guide or monitor treatment. Performed at [facility], [address] [place], [place] [address]    Studies/Results: No results found. Medications: I have reviewed the patient's current medications. Scheduled Meds: . amiodarone  100 mg Oral Daily  . chlorhexidine  15 mL Mouth Rinse BID  .  chlorhexidine gluconate (MEDLINE KIT)  15 mL Mouth Rinse BID  . levothyroxine  25 mcg Intravenous Q0600  . mouth rinse  15 mL Mouth Rinse q12n4p  . sodium chloride flush  3 mL Intravenous Q12H   Continuous Infusions: . sodium chloride    . dextrose 50 mL/hr at 02/17/20 0708  . heparin 550 Units/hr (02/17/20 0851)  . potassium chloride     PRN  Meds:.sodium chloride, acetaminophen **OR** acetaminophen, albuterol, dextrose, hydrALAZINE, lidocaine-prilocaine, ondansetron (ZOFRAN) IV, polyethylene glycol, sodium chloride flush   Assessment: Principal Problem:   Difficulty swallowing Active Problems:   Anemia of chronic disease   Coronary artery disease   Hyperlipidemia   Hypertension   Squamous cell cancer of hypopharynx (HCC)   Acute deep vein thrombosis (DVT) of right upper extremity (HCC)   AF (paroxysmal atrial fibrillation) (HCC)   Chronic systolic CHF (congestive heart failure) (HCC)   ESRD (end stage renal disease) (HCC)   Hypoglycemia   COPD (chronic obstructive pulmonary disease) (HCC)   Type II diabetes mellitus with renal manifestations (HCC)   Kerry [name] 86 y.o. male with a history of laryngectomy and tracheostomy over 6 years back and no prior issues with dysphagia has been having issues since redness today.  Cannot swallow any solids but can swallow liquids.  He had been on Eliquis which has been held.  Recent cardiac arrest in July which he is recovering from.  I have discussed the patient today with anesthesia and he is a high risk and I have discussed the same with the patient and family.  Our only options are to attempt endoscopy and dilation versus a PEG tube.  He has previously been able to eat and this seems to be a new issue.  Recent barium swallow shows high grade severe esophageal stricture.  Differentials would also include neoplasm which needs to be ruled out.   Plan: 1.  I have discussed with Dr. Holley Raring and Dr. Arbutus Ped.  I will plan to do the procedure around 8:30 AM tomorrow morning.  He will go for dialysis after the procedure.  It would need to be done under fluoroscopy.  I have discussed alternative options, risks & benefits,  which include, but are not limited to, bleeding, infection, perforation,respiratory complication & drug reaction.  The patient agrees with this plan & written consent will be  obtained.      LOS: 4 days   Kerry Bellows, MD 02/17/2020, 12:15 PM

## 2020-02-17 NOTE — Progress Notes (Signed)
PROGRESS NOTE    Kerry [name]   XHB:[id number]  DOB: [birth year]  PCP: Cecile Sheerer, MD    DOA: 02/13/2020 LOS: 4   Brief Narrative   Kerry [name] is a 86 y.o. 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  Vitals and labs in the ED were essentially unremarkable.  Labs consistent with ESRD, mild thrombocytopenia.    CT neck soft tissue showed "prominent debris within the oropharynx and upper esophagus which may reflect a food bolus. There is associated mild dilation of the upper esophagus. Findings raise the possibility of an upper esophageal stricture with obstruction.  Admitted for observation with GI consulted.  Barium esophagram on 9/10 confirmed severe focal esophageal narrowing at the level of the tracheostomy tube restricting passage of contrast most consistent with a high-grade stricture.  Eliquis is on hold and GI plans for EGD after 3 days off anticoagulation.     Assessment & Plan   Principal Problem:   Difficulty swallowing Active Problems:   Anemia of chronic disease   Coronary artery disease   Hyperlipidemia   Hypertension   Squamous cell cancer of hypopharynx (HCC)   Acute deep vein thrombosis (DVT) of right upper extremity (HCC)   AF (paroxysmal atrial fibrillation) (HCC)   Chronic systolic CHF (congestive heart failure) (HCC)   ESRD (end stage renal disease) (HCC)   Hypoglycemia   COPD (chronic obstructive pulmonary disease) (HCC)   Type II diabetes mellitus with renal manifestations ([place])   Difficulty swallowing due to esophageal stricture / narrowing - present on admission, to tolerance for any PO intake x 2 days  prior to admission.  GI is consulted.   Plan for EGD tomorrow AM 8:30 under fluoroscopy.  Hold Eliquis.  Cleared by cardiology for procedure.  Hypoglycemia - present on admission, improving.  Continue D10 infusion. CBG's every 4 hours to monitor.  D50 PRN.  Hold insulin.  For dialysis - consider giving D50 beforehand to prevent dropping, especially if D10 infusion is held during dialysis.  Would do q1h CBG's during dialysis.  Thrombocytopenia - mild, present on admission with platelets 123k>>108k>>101k>>95k.   Stop heparin for now, check HIT antibody.   Monitor CBC closely, is on heparin.  Hypokalemia - K 3.3 this AM.  Cautious replacement, get him closer to 4.0 given his A-fib.  Has dialysis tomorrow.  Anemia of chronic disease - Hgb 13.0 on admission.  Monitor CBC.  PO iron supplement on hold.  Left great toe pain due ingrown nail - consulted podiatry.  Ingrown nail debrided on 9/10.  Daily mupirocin ointment and light bandage.  Follow up podiatry outpatient as needed.  Coronary artery disease - stable, no chest pain.  Lipitor on hold.  Not on beta blocker.  Hyperlipidemia - Lipitor on hold  Type II diabetes mellitus with renal manifestations -  Recent A1c 5.5, well controlled.  Patient taking Lantus and glipizide.   Presented with hypoglycemia.  Hold Lantus and glipizide.   Hx of Hypertension - BP soft on admission, likely due to no PO intake and hypovolemia.  Is also on midodrine at home. -IV hydralazine as needed.  Hold midodrine until after EGD.  Squamous cell cancer of hypopharynx -  s/p of tracheostomy with stoma on trach collar.  Follow-up in [place].  Acute DVT of right upper extremity - stable. Hold Eliquis fpr EGD, continue heparin infusion.  Paroxysmal A-fib - rate controlled.  PO amiodarone on hold.  Per cardiology, they'll consider transition to amio gtt if needed.  Hold Eliquis for EGD.  Chronic systolic CHF - appears euvolemic, asymptomatic and currently  well-compensated.    Echo on 11/30/2019 showed EF of 20-30%.  Volume management by dialysis.  ESRD - on T/Th/S hemodialysis.  Nephrology consulted for dialysis. Will get tomorrow.  COPD - albuterol nebs PRN   Patient BMI: Body mass index is 20.64 kg/m.   DVT prophylaxis:    Diet:  Diet Orders (From admission, onward)    Start     Ordered   02/13/20 0957  Diet NPO time specified Except for: Sips with Meds, Ice Chips  Diet effective now       Question Answer Comment  Except for Sips with Meds   Except for Ice Chips      02/13/20 0957            Code Status: Full Code    Subjective 02/17/20    Patient seen with wife at bedside this AM.  They ask what time his procedure will be.  Wife reports patient is starving and he nods in agreement.  Not using speech valve today.  He is feeling progressively weak from no PO intake for several days now.  No other acute complaints.    Disposition Plan & Communication   Status is: Inpatient  Remains inpatient appropriate because:Ongoing diagnostic testing needed not appropriate for outpatient work up   Dispo: The patient is from: Home              Anticipated d/c is to: Home              Anticipated d/c date is: 2 days              Patient currently is not medically stable to d/c.   Family Communication: wife at bedside during encounter, 9/13   Consults, Procedures, Significant Events   Consultants:   Gastroenterology  Nephrology  Cardiology  Procedures:   Barium esophagram 9/10  Antimicrobials:   None    Objective   Vitals:   02/17/20 0732 02/17/20 1107 02/17/20 1426 02/17/20 1617  BP: 116/68 118/75  113/86  Pulse: 67 67  90  Resp: 18 18  19   Temp: 98 F (36.7 C) 98.1 F (36.7 C)  97.6 F (36.4 C)  TempSrc: Oral Oral  Oral  SpO2: 100% 100% 96% 100%  Weight:      Height:        Intake/Output Summary (Last 24 hours) at 02/17/2020 1646 Last data filed at 02/17/2020 1618 Gross per 24 hour  Intake  1369.88 ml  Output --  Net 1369.88 ml   Filed Weights   02/13/20 1703 02/14/20 0100  Weight: 57.7 kg 58 kg    Physical Exam:  General exam: awake, alert, no acute distress, frail HEENT: stoma appears clean and without secretions, trach collar in place Respiratory system: CTAB, no wheezes, rales or rhonchi, normal respiratory effort. Cardiovascular system: normal S1/S2, RRR, no pedal edema.   Gastrointestinal: soft, nontender, +bowel sounds   Labs   Data Reviewed: I have personally reviewed following labs and imaging studies  CBC: Recent Labs  Lab 02/12/20 1921 02/14/20 0103 02/15/20 0820 02/16/20 0251 02/17/20 0454  WBC 5.8 4.6 3.8*  4.2 3.8*  HGB 13.0 13.2 12.5* 12.5* 12.1*  HCT 37.4* 36.0* 35.3* 33.9* 33.4*  MCV 94.4 90.7 91.5 88.3 89.5  PLT 153 123* 108* 101* 95*   Basic Metabolic Panel: Recent Labs  Lab 02/12/20 1921 02/13/20 1007 02/14/20 0103 02/14/20 2254 02/15/20 0820 02/17/20 0454  NA 142 141 139  --  134* 134*  K 4.3 4.5 3.7  --  3.9 3.3*  CL 99 98 97*  --  96* 95*  CO2 26 25 29   --  23 26  GLUCOSE 55* 88 119*  --  73 115*  BUN 45* 54* 25*  --  35* 26*  CREATININE 5.98* 7.00* 4.44*  --  6.69* 6.12*  CALCIUM 9.0 9.1 8.6*  --  8.5* 8.6*  MG  --   --   --   --   --  1.9  PHOS  --   --   --  4.2  --   --    GFR: Estimated Creatinine Clearance: 7.4 mL/min (A) (by C-G formula based on SCr of 6.12 mg/dL (H)). Liver Function Tests: Recent Labs  Lab 02/12/20 1921  AST 19  ALT 21  ALKPHOS 54  BILITOT 1.4*  PROT 7.6  ALBUMIN 4.3   Recent Labs  Lab 02/12/20 1921  LIPASE 26   No results for input(s): AMMONIA in the last 168 hours. Coagulation Profile: Recent Labs  Lab 02/13/20 1009  INR 1.8*   Cardiac Enzymes: No results for input(s): CKTOTAL, CKMB, CKMBINDEX, TROPONINI in the last 168 hours. BNP (last 3 results) No results for input(s): PROBNP in the last 8760 hours. HbA1C: No results for input(s): HGBA1C in the last 72  hours. CBG: Recent Labs  Lab 02/17/20 0611 02/17/20 0733 02/17/20 1142 02/17/20 1437 02/17/20 1614  GLUCAP 127* 142* 172* 166* 156*   Lipid Profile: No results for input(s): CHOL, HDL, LDLCALC, TRIG, CHOLHDL, LDLDIRECT in the last 72 hours. Thyroid Function Tests: No results for input(s): TSH, T4TOTAL, FREET4, T3FREE, THYROIDAB in the last 72 hours. Anemia Panel: No results for input(s): VITAMINB12, FOLATE, FERRITIN, TIBC, IRON, RETICCTPCT in the last 72 hours. Sepsis Labs: No results for input(s): PROCALCITON, LATICACIDVEN in the last 168 hours.  Recent Results (from the past 240 hour(s))  SARS Coronavirus 2 by RT PCR (hospital order, performed in [facility] hospital lab) Nasopharyngeal Nasopharyngeal Swab     Status: None   Collection Time: 02/13/20  7:53 AM   Specimen: Nasopharyngeal Swab  Result Value Ref Range Status   SARS Coronavirus 2 NEGATIVE NEGATIVE Final    Comment: (NOTE) SARS-CoV-2 target nucleic acids are NOT DETECTED.  The SARS-CoV-2 RNA is generally detectable in upper and lower respiratory specimens during the acute phase of infection. The lowest concentration of SARS-CoV-2 viral copies this assay can detect is 250 copies / mL. A negative result does not preclude SARS-CoV-2 infection and should not be used as the sole basis for treatment or other patient management decisions.  A negative result may occur with improper specimen collection / handling, submission of specimen other than nasopharyngeal swab, presence of viral mutation(s) within the areas targeted by this assay, and inadequate number of viral copies (<250 copies / mL). A negative result must be combined with clinical observations, patient history, and epidemiological information.  Fact Sheet for Patients:   StrictlyIdeas.no  Fact Sheet for Healthcare Providers: BankingDealers.co.za  This test is not yet approved or  cleared by the [place]  FDA and has been authorized for detection and/or  diagnosis of SARS-CoV-2 by FDA under an Emergency Use Authorization (EUA).  This EUA will remain in effect (meaning this test can be used) for the duration of the COVID-19 declaration under Section 564(b)(1) of the Act, 21 U.S.C. section 360bbb-3(b)(1), unless the authorization is terminated or revoked sooner.  Performed at [facility], [address], [place], [place] [address]   Surgical PCR screen     Status: None   Collection Time: 02/14/20  1:21 AM   Specimen: Nasal Mucosa; Nasal Swab  Result Value Ref Range Status   MRSA, PCR NEGATIVE NEGATIVE Final   Staphylococcus aureus NEGATIVE NEGATIVE Final    Comment: (NOTE) The Xpert SA Assay (FDA approved for NASAL specimens in patients 33 years of age and older), is one component of a comprehensive surveillance program. It is not intended to diagnose infection nor to guide or monitor treatment. Performed at [facility], [address] [place], [place] [address]       Imaging Studies   No results found.   Medications   Scheduled Meds: . amiodarone  100 mg Oral Daily  . chlorhexidine  15 mL Mouth Rinse BID  . chlorhexidine gluconate (MEDLINE KIT)  15 mL Mouth Rinse BID  . levothyroxine  25 mcg Intravenous Q0600  . mouth rinse  15 mL Mouth Rinse q12n4p  . sodium chloride flush  3 mL Intravenous Q12H   Continuous Infusions: . sodium chloride    . dextrose 50 mL/hr at 02/17/20 1328  . heparin 650 Units/hr (02/17/20 1503)       LOS: 4 days    Time spent: 20 minutes    Ezekiel Slocumb, DO [facility]  02/17/2020, 4:46 PM    If 7PM-7AM, please contact night-coverage. How to contact the [facility] Attending or Consulting provider [place] or covering provider during after hours [place], for this patient?    1. Check the care team in [facility] and look for a) attending/consulting [facility] provider listed and b) the [facility] team listed 2. Log into  [email or web address] and use [place]'s universal password to access. If you do not have the password, please contact the hospital operator. 3. Locate the [facility] provider you are looking for under [facility] and page to a number that you can be directly reached. 4. If you still have difficulty reaching the provider, please page the [facility] (Director on Call) for the Hospitalists listed on amion for assistance.

## 2020-02-18 ENCOUNTER — Inpatient Hospital Stay
Admission: EM | Disposition: A | Discharge status: Home / Self Care | Payer: Self-pay | Source: Home / Self Care | Attending: Internal Medicine

## 2020-02-18 ENCOUNTER — Inpatient Hospital Stay: Payer: Medicare Other | Admitting: Certified Registered"

## 2020-02-18 ENCOUNTER — Encounter: Payer: Self-pay | Admitting: Internal Medicine

## 2020-02-18 DIAGNOSIS — E43 Unspecified severe protein-calorie malnutrition: Secondary | ICD-10-CM | POA: Insufficient documentation

## 2020-02-18 HISTORY — PX: ESOPHAGOGASTRODUODENOSCOPY (EGD) WITH PROPOFOL: SHX5813

## 2020-02-18 LAB — BASIC METABOLIC PANEL
Anion gap: 12 (ref 5–15)
BUN: 31 mg/dL — ABNORMAL HIGH (ref 8–23)
CO2: 25 mmol/L (ref 22–32)
Calcium: 8.6 mg/dL — ABNORMAL LOW (ref 8.9–10.3)
Chloride: 94 mmol/L — ABNORMAL LOW (ref 98–111)
Creatinine, Ser: 7.33 mg/dL — ABNORMAL HIGH (ref 0.61–1.24)
GFR calc Af Amer: 7 mL/min — ABNORMAL LOW (ref >60)
GFR calc non Af Amer: 6 mL/min — ABNORMAL LOW (ref >60)
Glucose, Bld: 149 mg/dL — ABNORMAL HIGH (ref 70–99)
Potassium: 3.3 mmol/L — ABNORMAL LOW (ref 3.5–5.1)
Sodium: 131 mmol/L — ABNORMAL LOW (ref 135–145)

## 2020-02-18 LAB — CBC
HCT: 33.8 % — ABNORMAL LOW (ref 39.0–52.0)
Hemoglobin: 12.2 g/dL — ABNORMAL LOW (ref 13.0–17.0)
MCH: 32.4 pg (ref 26.0–34.0)
MCHC: 36.1 g/dL — ABNORMAL HIGH (ref 30.0–36.0)
MCV: 89.7 fL (ref 80.0–100.0)
Platelets: 95 10*3/uL — ABNORMAL LOW (ref 150–400)
RBC: 3.77 MIL/uL — ABNORMAL LOW (ref 4.22–5.81)
RDW: 15.9 % — ABNORMAL HIGH (ref 11.5–15.5)
WBC: 3.6 10*3/uL — ABNORMAL LOW (ref 4.0–10.5)
nRBC: 0 % (ref 0.0–0.2)

## 2020-02-18 LAB — APTT
aPTT: 43 seconds — ABNORMAL HIGH (ref 24–36)
aPTT: 43 seconds — ABNORMAL HIGH (ref 24–36)

## 2020-02-18 LAB — GLUCOSE, CAPILLARY
Glucose-Capillary: 159 mg/dL — ABNORMAL HIGH (ref 70–99)
Glucose-Capillary: 109 mg/dL — ABNORMAL HIGH (ref 70–99)
Glucose-Capillary: 116 mg/dL — ABNORMAL HIGH (ref 70–99)
Glucose-Capillary: 137 mg/dL — ABNORMAL HIGH (ref 70–99)
Glucose-Capillary: 148 mg/dL — ABNORMAL HIGH (ref 70–99)

## 2020-02-18 LAB — HEPARIN LEVEL (UNFRACTIONATED): Heparin Unfractionated: 1.42 IU/mL — ABNORMAL HIGH (ref 0.30–0.70)

## 2020-02-18 SURGERY — ESOPHAGOGASTRODUODENOSCOPY (EGD) WITH PROPOFOL
Anesthesia: General

## 2020-02-18 MED ORDER — NOREPINEPHRINE BITARTRATE 1 MG/ML IV SOLN
INTRAVENOUS | Status: DC | PRN
  Administered 2020-02-18 (×5): 1 mL via INTRAVENOUS

## 2020-02-18 MED ORDER — PROPOFOL 10 MG/ML IV BOLUS
INTRAVENOUS | Status: DC | PRN
  Administered 2020-02-18: 30 mg via INTRAVENOUS
  Administered 2020-02-18 (×2): 20 mg via INTRAVENOUS

## 2020-02-18 MED ORDER — ADULT MULTIVITAMIN LIQUID CH
15.0000 mL | Freq: Every day | ORAL | Status: DC
  Administered 2020-02-19: 15 mL
  Filled 2020-02-18: qty 15

## 2020-02-18 MED ORDER — PROPOFOL 500 MG/50ML IV EMUL
INTRAVENOUS | Status: DC | PRN
  Administered 2020-02-18: 100 ug/kg/min via INTRAVENOUS

## 2020-02-18 MED ORDER — RENA-VITE PO TABS
1.0000 | ORAL_TABLET | Freq: Every day | ORAL | Status: DC
  Administered 2020-02-18 – 2020-02-19 (×2): 1 via ORAL
  Filled 2020-02-18 (×2): qty 1

## 2020-02-18 MED ORDER — NEPRO/CARBSTEADY PO LIQD
237.0000 mL | Freq: Three times a day (TID) | ORAL | Status: DC
  Administered 2020-02-18 – 2020-02-20 (×6): 237 mL via ORAL

## 2020-02-18 MED ORDER — LIDOCAINE HCL (PF) 2 % IJ SOLN
INTRAMUSCULAR | Status: AC
  Filled 2020-02-18: qty 5

## 2020-02-18 MED ORDER — SODIUM CHLORIDE 0.9 % IV SOLN: INTRAVENOUS | Status: DC

## 2020-02-18 MED ORDER — PROPOFOL 10 MG/ML IV BOLUS
INTRAVENOUS | Status: AC
  Filled 2020-02-18: qty 40

## 2020-02-18 NOTE — Progress Notes (Signed)
SLP Cancellation Note  Patient Details Name: Job Holtsclaw MRN: [id number] DOB: [birth year]   Cancelled treatment:       Reason Eval/Treat Not Completed: Patient at procedure or test/unavailable (pt remains at HD). Attempted BSE today; pt at EGD this AM then immediately to HD. He has not completed HD session at the time of this note. ST services will f/u w/ BSE first thing in the morning. Recommend frequent oral care w/ aspiration precautions tonight w/ NSG.      [name], MS, CCC-SLP Speech Language Pathologist Rehab Services [phone] [facility] 02/18/2020, 3:02 PM

## 2020-02-18 NOTE — H&P (Signed)
Kerry Bellows, MD [address], [place], [place], [place], [address] [address], [place], [place], [place], [address] Phone: [phone]  Fax: [phone]  Primary Care Physician:  Cecile Sheerer, MD   Pre-Procedure History & Physical: HPI:  Kerry [name] is a 86 y.o. male is here for an endoscopy    Past Medical History:  Diagnosis Date  . Cancer ([place])   . CHF (congestive heart failure) ([place])   . COPD (chronic obstructive pulmonary disease) ([place])   . Diabetes mellitus without complication ([place])   . Hypertension   . Renal disorder     Past Surgical History:  Procedure Laterality Date  . BACK SURGERY    . TOTAL LARYNGECTOMY    . TRACHELECTOMY      Prior to Admission medications   Medication Sig Start Date End Date Taking? Authorizing Provider  amiodarone (PACERONE) 100 MG tablet Take 1 tablet (100 mg total) by mouth daily. 12/13/19 02/13/20 Yes Shahmehdi, Valeria Batman, MD  apixaban (ELIQUIS) 5 MG TABS tablet Two tablets twice a day through 12/17/2019 then one tablet twice aday afterwards 12/17/19  Yes Wieting, Richard, MD  B Complex-C-Folic Acid (TRIPHROCAPS) 1 MG CAPS Take 1 capsule by mouth daily. 02/07/20  Yes [provider]  epoetin alfa (EPOGEN) 2000 UNIT/ML injection Inject 1 mL (2,000 Units total) into the vein Every Tuesday,Thursday,and Saturday with dialysis. 12/12/19  Yes Shahmehdi, Valeria Batman, MD  EUTHYROX 50 MCG tablet Take 50 mcg by mouth daily. 01/15/20  Yes [provider]  ferrous sulfate 325 (65 FE) MG EC tablet Take 325 mg by mouth daily with breakfast.   Yes [provider]  glipiZIDE (GLUCOTROL) 5 MG tablet Take 5 mg by mouth every morning. 01/15/20  Yes [provider]  midodrine (PROAMATINE) 10 MG tablet Take 10 mg by mouth 3 (three) times daily. 01/20/20  Yes [provider]  sevelamer carbonate (RENVELA) 800 MG tablet Take 800-1,600 mg by mouth in the morning, at noon, in the evening, and at bedtime.  11/14/19  Yes [provider]  atorvastatin (LIPITOR) 80 MG tablet Take 80 mg by mouth daily.  03/15/17   [provider]  chlorhexidine gluconate, MEDLINE KIT, (PERIDEX) 0.12 % solution 15 mLs by Mouth Rinse route 2 (two) times daily. 12/12/19   Shahmehdi, Valeria Batman, MD  glucagon (GLUCAGON EMERGENCY) 1 MG injection as needed. Reported on 06/02/2015 01/19/15   [provider]  glucose blood (PRECISION QID TEST) test strip Accu check Aviva strips, use twice daily 01/16/15   [provider]  LANTUS SOLOSTAR 100 UNIT/ML Solostar Pen Inject 2 Units into the skin daily after breakfast.  01/15/20   [provider]  lidocaine-prilocaine (EMLA) cream Apply 1 application topically as needed (topical anesthesia for hemodialysis if Gebauers and Lidocaine injection are ineffective.). 12/12/19   Shahmehdi, Valeria Batman, MD  melatonin 5 MG TABS Take 0.5 tablets (2.5 mg total) by mouth at bedtime. 12/17/19   Loletha Grayer, MD  polyethylene glycol (MIRALAX / GLYCOLAX) 17 g packet Take 17 g by mouth daily as needed for moderate constipation. 12/17/19   Loletha Grayer, MD    Allergies as of 02/12/2020 - Review Complete 02/12/2020  Allergen Reaction Noted  . Ace inhibitors Other (See Comments) 02/02/2017  . Gabapentin Other (See Comments) 11/11/2016  . Losartan Other (See Comments) 02/02/2017    Family History  Problem Relation Age of Onset  . CAD Sister     Social History   Socioeconomic History  .  Marital status: Married    Spouse name: Not on file  . Number of children: Not on file  . Years of education: Not on file  . Highest education level: Not on file  Occupational History  . Not on file  Tobacco Use  . Smoking status: Former Smoker    Packs/day: 0.50    Years: 20.00    Pack years: 10.00    Types: Cigarettes    Quit date: 12/05/2014    Years since quitting: 5.2  . Smokeless tobacco: Never Used  . Tobacco comment: Quit 12/2014  Vaping Use  . Vaping Use:  Never used  Substance and Sexual Activity  . Alcohol use: No  . Drug use: No  . Sexual activity: Not on file  Other Topics Concern  . Not on file  Social History Narrative  . Not on file   Social Determinants of Health   Financial Resource Strain:   . Difficulty of Paying Living Expenses: Not on file  Food Insecurity:   . Worried About Charity fundraiser in the Last Year: Not on file  . Ran Out of Food in the Last Year: Not on file  Transportation Needs:   . Lack of Transportation (Medical): Not on file  . Lack of Transportation (Non-Medical): Not on file  Physical Activity:   . Days of Exercise per Week: Not on file  . Minutes of Exercise per Session: Not on file  Stress:   . Feeling of Stress : Not on file  Social Connections:   . Frequency of Communication with Friends and Family: Not on file  . Frequency of Social Gatherings with Friends and Family: Not on file  . Attends Religious Services: Not on file  . Active Member of Clubs or Organizations: Not on file  . Attends Archivist Meetings: Not on file  . Marital Status: Not on file  Intimate Partner Violence:   . Fear of Current or Ex-Partner: Not on file  . Emotionally Abused: Not on file  . Physically Abused: Not on file  . Sexually Abused: Not on file    Review of Systems: See HPI, otherwise negative ROS  Physical Exam: BP 112/69   Pulse 67   Temp (!) 96.8 F (36 C) (Temporal)   Resp 20   Ht _0  (1.676 m)   Wt 58 kg   SpO2 100% Comment: 3 liters  BMI 20.64 kg/m  General:   Alert,  pleasant and cooperative in NAD Head:  Normocephalic and atraumatic. Neck:  Supple; no masses or thyromegaly.tracheostomyLungs:  Clear throughout to auscultation, normal respiratory effort.    Heart:  +S1, +S2, Regular rate and rhythm, No edema. Abdomen:  Soft, nontender and nondistended. Normal bowel sounds, without guarding, and without rebound.   Neurologic:  Alert and  oriented x4;  grossly normal  neurologically.  Impression/Plan: Kerry [name] is here for an endoscopy  to be performed for  evaluation of dysphagia    Risks, benefits, limitations, and alternatives regarding endoscopy have been reviewed with the patient.  Questions have been answered.  All parties agreeable.   Kerry Bellows, MD  02/18/2020, 8:41 AM

## 2020-02-18 NOTE — Plan of Care (Signed)
  Problem: Clinical Measurements: Goal: Ability to maintain clinical measurements within normal limits will improve Outcome: Progressing   

## 2020-02-18 NOTE — Transfer of Care (Signed)
Immediate Anesthesia Transfer of Care Note  Patient: Kerry [name]  Procedure(s) Performed: ESOPHAGOGASTRODUODENOSCOPY (EGD) WITH PROPOFOL (N/A )  Patient Location: PACU  Anesthesia Type:MAC  Level of Consciousness: drowsy  Airway & Oxygen Therapy: Patient Spontanous Breathing and Patient connected to tracheostomy mask oxygen  Post-op Assessment: Report given to RN and Post -op Vital signs reviewed and stable  Post vital signs: stable  Last Vitals:  Vitals Value Taken Time  BP 91/58 02/18/20 0913  Temp 36.1 C 02/18/20 0913  Pulse 74 02/18/20 0914  Resp 27 02/18/20 0914  SpO2 97 % 02/18/20 0914  Vitals shown include unvalidated device data.  Last Pain:  Vitals:   02/18/20 0913  TempSrc: Temporal  PainSc:          Complications: No complications documented.

## 2020-02-18 NOTE — Progress Notes (Signed)
PT Cancellation Note  Patient Details Name: Isaih Bulger MRN: [id number] DOB: 01-14-1934   Cancelled Treatment:    Reason Eval/Treat Not Completed:  (Consult received and chart reviewed. Patient currently off unit for endoscopy.  Will re-attempt at later time/date as medically appropriate and available.)  Nitesh Pitstick H. Owens Shark, PT, DPT, NCS 02/18/20, 10:24 AM [phone]

## 2020-02-18 NOTE — Anesthesia Preprocedure Evaluation (Addendum)
Anesthesia Evaluation  Patient identified by MRN, date of birth, ID band Patient awake    Reviewed: Allergy & Precautions, H&P , NPO status , Patient's Chart, lab work & pertinent test results  History of Anesthesia Complications Negative for: history of anesthetic complications  Airway Mallampati: II      Comment: Tracheal stoma with trach collar in place 3L O2 Dental  (+) Partial Upper   Pulmonary neg sleep apnea, COPD,  oxygen dependent, former smoker,  Intermittent O2 via trach collar    + decreased breath sounds      Cardiovascular hypertension, (-) angina+ CAD, + Past MI and +CHF  (-) Cardiac Stents + dysrhythmias  Rhythm:irregular Rate:Normal  H/o MI with VFib arrest in July 2021 Echo 11/30/19: 1. Left ventricular ejection fraction, by estimation, is 25 to 30%. Left  ventricular ejection fraction by PLAX is 26 %. The left ventricle has  severely decreased function. The left ventricle has no regional wall  motion abnormalities. The left  ventricular internal cavity size was moderately dilated. Left ventricular  diastolic parameters are consistent with Grade I diastolic dysfunction  (impaired relaxation).  2. Right ventricular systolic function is normal. The right ventricular  size is mildly enlarged.  3. The mitral valve was not well visualized. Trivial mitral valve  regurgitation.  4. The aortic valve was not well visualized. Aortic valve regurgitation  is trivial. Mild aortic valve sclerosis is present, with no evidence of  aortic valve stenosis.    Neuro/Psych negative neurological ROS  negative psych ROS   GI/Hepatic negative GI ROS, Neg liver ROS, High grade severe esophageal stricture.  Unable to swallow solids, only liquids.   Endo/Other  diabetes  Renal/GU ESRF and DialysisRenal disease  negative genitourinary   Musculoskeletal   Abdominal   Peds  Hematology  (+) Blood dyscrasia, anemia , Hgb  12.2 Thrombocytopenia, now 90K   Anesthesia Other Findings H/o laryngectomy and tracheostomy 6 years ago  Reproductive/Obstetrics negative OB ROS                           Anesthesia Physical Anesthesia Plan  ASA: IV  Anesthesia Plan: General   Post-op Pain Management:    Induction:   PONV Risk Score and Plan: Propofol infusion and TIVA  Airway Management Planned:   Additional Equipment:   Intra-op Plan:   Post-operative Plan:   Informed Consent: I have reviewed the patients History and Physical, chart, labs and discussed the procedure including the risks, benefits and alternatives for the proposed anesthesia with the patient or authorized representative who has indicated his/her understanding and acceptance.     Dental Advisory Given  Plan Discussed with: Anesthesiologist, CRNA and Surgeon  Anesthesia Plan Comments:         Anesthesia Quick Evaluation

## 2020-02-18 NOTE — Progress Notes (Signed)
[facility]  ROUNDING NOTE   Subjective:  Patient seen in Endoscopy post procedural area, waking up from sedation, in no acute distress. Plan for dialysis today when he is back from the procedure.   Objective:  Vital signs in last 24 hours:  Temp:  [96.8 F (36 C)-98.4 F (36.9 C)] 97.9 F (36.6 C) (09/14 1035) Pulse Rate:  [63-90] 63 (09/14 1035) Resp:  [16-22] 22 (09/14 1035) BP: (103-122)/(57-86) 103/66 (09/14 1035) SpO2:  [96 %-100 %] 98 % (09/14 1035) Weight:  [58 kg] 58 kg (09/14 0756)  Weight change:  Filed Weights   02/13/20 1703 02/14/20 0100 02/18/20 0756  Weight: 57.7 kg 58 kg 58 kg    Intake/Output: I/O last 3 completed shifts: In: 2169.9 [I.V.:2009.9; IV Piggyback:160] Out: 0    Intake/Output this shift:  Total I/O In: 100 [I.V.:100] Out: -   Physical Exam: General:  Resting in stretcher, post endoscopy procedure  Head:  Normocephalic, atraumatic. Moist oral mucosal membranes  Eyes:  Anicteric  Neck:  Tracheostomy collar on   Lungs:   Crackles + bilaterally, normal and symmetrical effort  Heart:  Regular, S1S2 no rubs or gallop  Abdomen:   Nontender, non distended  Extremities:  No peripheral edema.  Neurologic:  Sedated, responds to call, trying to open eyes  Skin:  No acute lesions or rashes  Access:  Left upper extremity AV graft    Basic Metabolic Panel: Recent Labs  Lab 02/13/20 1007 02/13/20 1007 02/14/20 0103 02/14/20 0103 02/14/20 2254 02/15/20 0820 02/17/20 0454 02/18/20 0550  NA 141  --  139  --   --  134* 134* 131*  K 4.5  --  3.7  --   --  3.9 3.3* 3.3*  CL 98  --  97*  --   --  96* 95* 94*  CO2 25  --  29  --   --  23 26 25   GLUCOSE 88  --  119*  --   --  73 115* 149*  BUN 54*  --  25*  --   --  35* 26* 31*  CREATININE 7.00*  --  4.44*  --   --  6.69* 6.12* 7.33*  CALCIUM 9.1   < > 8.6*   < >  --  8.5* 8.6* 8.6*  MG  --   --   --   --   --   --  1.9  --   PHOS  --   --   --   --  4.2  --   --   --    < > =  values in this interval not displayed.    Liver Function Tests: Recent Labs  Lab 02/12/20 1921  AST 19  ALT 21  ALKPHOS 54  BILITOT 1.4*  PROT 7.6  ALBUMIN 4.3   Recent Labs  Lab 02/12/20 1921  LIPASE 26   No results for input(s): AMMONIA in the last 168 hours.  CBC: Recent Labs  Lab 02/14/20 0103 02/15/20 0820 02/16/20 0251 02/17/20 0454 02/18/20 0550  WBC 4.6 3.8* 4.2 3.8* 3.6*  HGB 13.2 12.5* 12.5* 12.1* 12.2*  HCT 36.0* 35.3* 33.9* 33.4* 33.8*  MCV 90.7 91.5 88.3 89.5 89.7  PLT 123* 108* 101* 95* 95*    Cardiac Enzymes: No results for input(s): CKTOTAL, CKMB, CKMBINDEX, TROPONINI in the last 168 hours.  BNP: Invalid input(s): POCBNP  CBG: Recent Labs  Lab 02/17/20 1437 02/17/20 1614 02/17/20 2136 02/17/20 2321 02/18/20 1039  GLUCAP 166* [place]    Microbiology: Results for orders placed or performed during the hospital encounter of 02/13/20  SARS Coronavirus 2 by RT PCR (hospital order, performed in [facility] hospital lab) Nasopharyngeal Nasopharyngeal Swab     Status: None   Collection Time: 02/13/20  7:53 AM   Specimen: Nasopharyngeal Swab  Result Value Ref Range Status   SARS Coronavirus 2 NEGATIVE NEGATIVE Final    Comment: (NOTE) SARS-CoV-2 target nucleic acids are NOT DETECTED.  The SARS-CoV-2 RNA is generally detectable in upper and lower respiratory specimens during the acute phase of infection. The lowest concentration of SARS-CoV-2 viral copies this assay can detect is 250 copies / mL. A negative result does not preclude SARS-CoV-2 infection and should not be used as the sole basis for treatment or other patient management decisions.  A negative result may occur with improper specimen collection / handling, submission of specimen other than nasopharyngeal swab, presence of viral mutation(s) within the areas targeted by this assay, and inadequate number of viral copies (<250 copies / mL). A negative result must be  combined with clinical observations, patient history, and epidemiological information.  Fact Sheet for Patients:   StrictlyIdeas.no  Fact Sheet for Healthcare Providers: BankingDealers.co.za  This test is not yet approved or  cleared by the [place] FDA and has been authorized for detection and/or diagnosis of SARS-CoV-2 by FDA under an Emergency Use Authorization (EUA).  This EUA will remain in effect (meaning this test can be used) for the duration of the COVID-19 declaration under Section 564(b)(1) of the Act, 21 U.S.C. section 360bbb-3(b)(1), unless the authorization is terminated or revoked sooner.  Performed at [facility], [address] [place], [place] [address]   Surgical PCR screen     Status: None   Collection Time: 02/14/20  1:21 AM   Specimen: Nasal Mucosa; Nasal Swab  Result Value Ref Range Status   MRSA, PCR NEGATIVE NEGATIVE Final   Staphylococcus aureus NEGATIVE NEGATIVE Final    Comment: (NOTE) The Xpert SA Assay (FDA approved for NASAL specimens in patients 27 years of age and older), is one component of a comprehensive surveillance program. It is not intended to diagnose infection nor to guide or monitor treatment. Performed at [facility], [address] [place], [place] [address]     Coagulation Studies: No results for input(s): LABPROT, INR in the last 72 hours.  Urinalysis: No results for input(s): COLORURINE, LABSPEC, PHURINE, GLUCOSEU, HGBUR, BILIRUBINUR, KETONESUR, PROTEINUR, UROBILINOGEN, NITRITE, LEUKOCYTESUR in the last 72 hours.  Invalid input(s): APPERANCEUR    Imaging: No results found.   Medications:   . [MAR Hold] sodium chloride    . sodium chloride    . dextrose 50 mL/hr at 02/18/20 1050   . [MAR Hold] amiodarone  100 mg Oral Daily  . [MAR Hold] chlorhexidine  15 mL Mouth Rinse BID  . [MAR Hold] chlorhexidine gluconate (MEDLINE KIT)  15 mL Mouth  Rinse BID  . [MAR Hold] levothyroxine  25 mcg Intravenous Q0600  . [MAR Hold] mouth rinse  15 mL Mouth Rinse q12n4p  . [MAR Hold] sodium chloride flush  3 mL Intravenous Q12H   [MAR Hold] sodium chloride, [MAR Hold] acetaminophen **OR** [MAR Hold] acetaminophen, [MAR Hold] albuterol, [MAR Hold] dextrose, [MAR Hold] hydrALAZINE, [MAR Hold] lidocaine-prilocaine, [MAR Hold] ondansetron (ZOFRAN) IV, [MAR Hold] polyethylene glycol, [MAR Hold] sodium chloride flush  Assessment/ Plan:  86 y.o. male with past medical history of ESRD on HD  TTS, anemia of chronic kidney disease, secondary hyperparathyroidism, laryngeal cancer status post tracheostomy placement who was admitted with nausea, vomiting, dysphagia.  [facility]/TTS/LUE AVG  1.  ESRD on HD TTS.  Plan dialysis today after the endoscopy procedure  Will continue TTS schedule as outpatient  2.  Anemia of chronic kidney disease.   Hgb level at goal, 12.2 today Continue to monitor CBC.   3.  Secondary hyperparathyroidism.   Check serum phos.  4.  Dysphagia:   Discussed with GI.  Tracheostomy maybe effecting esophagus.      LOS: 5 Alee Katen 9/14/202111:11 AM

## 2020-02-18 NOTE — Evaluation (Addendum)
Clinical/Bedside Swallow Evaluation Patient Details  Name: Kerry [name] MRN: [id number] Date of Birth: 12-15-1933  Today's Date: 02/18/2020 Time: SLP Start Time (ACUTE ONLY): 1600 SLP Stop Time (ACUTE ONLY): 1715 SLP Time Calculation (min) (ACUTE ONLY): 75 min  Past Medical History:  Past Medical History:  Diagnosis Date   Cancer ([place])    CHF (congestive heart failure) (HCC)    COPD (chronic obstructive pulmonary disease) (HCC)    Diabetes mellitus without complication ([place])    Hypertension    Renal disorder    Past Surgical History:  Past Surgical History:  Procedure Laterality Date   BACK SURGERY     ESOPHAGOGASTRODUODENOSCOPY (EGD) WITH PROPOFOL N/A 02/18/2020   Procedure: ESOPHAGOGASTRODUODENOSCOPY (EGD) WITH PROPOFOL;  Surgeon: [name], MD;  Location[facility] ENDOSCOPY;  Service: Gastroenterology;  Laterality: N/A;   TOTAL LARYNGECTOMY     TRACHELECTOMY     HPI:  Pt is a 86 year old male with history of end-stage renal disease on hemodialysis, history of laryngeal carcinoma status post tracheostomy -- 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  Pt and Wife report pt had dialysis on Tuesday and after returning, pt has had multiple episodes of emesis w/ the feeling he could not get liquid and food "down".  On day of admit, pt's blood sugar was 42 at home when EMS was called.  Pt w/ stoma/laryngectomy and on 2-3L chronic O2 at home. CXR this admit: "Bibasilar atelectasis/infiltrates again noted. Small right pleural effusion again noted. No left pleural effusion noted on today's exam."  Assessment / Plan / Recommendation Clinical Impression  Pt appears to present w/ grossly adequate oropharyngeal phase swallow function in light of having had a total laryngectomy in 2016 d/t laryngeal cancer and vocal cord paralysis per chart notes. Pt has  been on a Regular diet w/ thin liquids at home at baseline, AND pt was eating adequately at home per Wife/pt prior to last 2 days -- when pt's blood sugar dropped; emesis events occurred. Pt and Wife endorsed BASELINE mild, overt attempted coughing during meals PRIOR to this illness -- this is known to pt's MDs and Otolaryngologist/SLP at his [facility] over the years as he follows up w/ them every 4 months d/t use of a TEP for verbal communication did exhibit. Most recent [facility] visit indicated adequate placement and fitting of the TEP device w/ minimal concern re: leakage(he had attempted to wear/use a Large TEP device, but it was removed and downsized to a Smaller TEP device d/t discomfort, per Wife). See current CXR this admit. For this evaluation today, pt and Wife are eager to begin po trials. Oral care completed, and pt positioned upright in bed. Discussed his Baseline swallowing noting the intermittent, attempted coughing during meals/with po's. Pt consumed trials of ice chips, thin liquids, Nectar liquids and purees w/ no immediate, overt clinical s/s of aspiration noted -- no decline in ANS or respiratory effort. Noted noted min+ audible swallows and air exchange suspect d/t altered pharyngeal pressure secondary to laryngectomy and the Fistula w/ TEP device. However, noted micro+ leakage (w/ Nepro) around the TEP device upon anterior viewing into the Stoma w/ flashlight. Pt was insensate to the leakage noted around the TEP device and along the posterior tracheal wall. Oral phase appeared [facility] for bolus management and oral clearing w/ each trial. Pt took his time for lingual sweeping and utilized f/u swallows w/ trials. OM exam  appeared wfl; no unilateral weakness noted. Pt fed self w/ setup support. Pt endorsed the swallowing seemed and felt "fine" for him during trials given. He did not exhibit overt discomfort and did not seem worried. He continued to endorse a mild feeling of "fullness" behind the  Stoma/TEP area of the throat.  Discussed the above w/ NSG and MD. Recommend a Full Liquid diet w/ thin liquids. Recommend general aspiration precautions; Reflux precautions. Pills Crushed in loose puree as able. Recommend monitoring for any increased coughing during oral intake, or decline in Pulmonary status post meals.  Recommend f/u w/ his Team at [facility] re: the TEP device, its placement and comfort for pt, potential direct viewing of the Esophagus/posterior anchor side of the TEP device to determine if any relation to the "full" feelings/discomfort he feels in that area when swallowing, and further discussion w/ pt and Wife re: potential risk for aspiration through the TEP device as this was noted during this evaluation today. Recommend discussion re: negative sequelae of aspiration including pulmonary decline; options.  SLP Visit Diagnosis: Dysphagia, unspecified (R13.10)    Aspiration Risk  Mild aspiration risk;Risk for inadequate nutrition/hydration (d/t esophageal-tracheal leakage)    Diet Recommendation  Full Liquid diet (thins) w/ general aspiration and Reflux precautions; encouraged coughing and suctioning of stoma/airway post meals  Medication Administration: Crushed with puree (w/ loose puree)    Other  Recommendations Recommended Consults: Consider GI evaluation;Consider ENT evaluation (w/ his Team at [facility]) Oral Care Recommendations: Oral care BID;Oral care before and after PO;Patient independent with oral care Other Recommendations:  (n/a)   Follow up Recommendations  (at [facility] w/ his Team)      Frequency and Duration  (n/a)   (n/a)       Prognosis Prognosis for Safe Diet Advancement: Fair Barriers to Reach Goals: Time post onset;Severity of deficits (laryngectomy)      Swallow Study   General Date of Onset: 02/12/20 HPI: Pt is a 86 year old male with history of end-stage renal disease on hemodialysis, history of laryngeal carcinoma status post tracheostomy -- Aphonia  secondary to total laryngectomy(pt is using a tracheo-esophageal voice prosthesis to verbally communicate), GERD on PPI, hyperlipidemia, sleep apnea, CHF, COPD, HTN, DM.  Pt and Wife report pt had dialysis on Tuesday and after returning, pt has had multiple episodes of emesis w/ the feeling he could not get liquid and food "down".  On day of admit, pt's blood sugar was 42 at home when EMS was called.  Pt w/ stoma/laryngectomy and on 2-3L chronic O2 at home.  Type of Study: Bedside Swallow Evaluation Previous Swallow Assessment: yes;  12/03/2019 Diet Prior to this Study: NPO;IV Temperature Spikes Noted: No (wbc 3.6) Respiratory Status: Nasal cannula (3L) History of Recent Intubation: No Behavior/Cognition: Alert;Cooperative;Pleasant mood (nonverbal sec to laryngectomy) Oral Cavity Assessment: Within Functional Limits Oral Care Completed by SLP: Yes Oral Cavity - Dentition: Adequate natural dentition Vision: Functional for self-feeding Self-Feeding Abilities: Able to feed self;Needs set up Patient Positioning: Upright in bed (in bed; on EOB during session) Baseline Vocal Quality:  (aphonia d/t laryngectomy) Volitional Cough:  (n/a) Volitional Swallow: Able to elicit    Oral/Motor/Sensory Function Overall Oral Motor/Sensory Function: Within functional limits   Ice Chips Ice chips: Within functional limits Presentation: Spoon (fed; 6 trials)   Thin Liquid Thin Liquid: Within functional limits Presentation: Cup;Self Fed;Spoon (6 tsp trials; 15+ cup sip trials)    Nectar Thick Nectar Thick Liquid: Within functional limits Presentation: Cup;Self Fed;Spoon (15+ tsp trials) Other  Comments: noted micro+ leakage (w/ Nepro) around the TEP device upon anterior viewing into the stoma w/ flashlight   Honey Thick Honey Thick Liquid: Not tested   Puree Puree: Within functional limits (loose) Presentation: Self Fed;Spoon (6+ trials)   Solid     Solid: Not tested       [name], MS,  CCC-SLP Speech Language Pathologist Rehab Services [phone] Talyn Eddie 02/18/2020,5:50 PM

## 2020-02-18 NOTE — Progress Notes (Signed)
Initial Nutrition Assessment  DOCUMENTATION CODES:   Severe malnutrition in context of chronic illness  INTERVENTION:   Nepro Shake po TID, each supplement provides 425 kcal and 19 grams protein  Magic cup TID with meals, each supplement provides 290 kcal and 9 grams of protein  Rena-vite daily   Liquid MVI daily   Pt at high refeed risk; recommend monitor K, Mg and P labs daily until stable  NUTRITION DIAGNOSIS:   Severe Malnutrition related to chronic illness (laryngeal cancer, ESRD on HD, COPD) as evidenced by severe fat depletion, severe muscle depletion.  GOAL:   Patient will meet greater than or equal to 90% of their needs  MONITOR:   Diet advancement, Labs, Weight trends, Skin, I & O's  REASON FOR ASSESSMENT:   Consult Assessment of nutrition requirement/status  ASSESSMENT:   86 y.o. male with medical history significant of laryngeal carcinoma, s/p of tracheostomy with stoma on trach collar, ESRD (TTS), HTN, HLD, DM, COPD, on 2-3L of oxygen at baseline, sCHF with EF 20-30%, RUE DVT, A fib on Eliquis, CAD, presents with nausea vomiting difficulty swallowing x 2 days with inability to tolerate anything by mouth including water.   Pt s/p EGD today; no stricture noted  Met with pt and pt's wife in room today. Pt tired after dialysis so history obtained from pt's wife at bedside. Per wife report, pt was eating well with no issues swallowing up until 3 days ago. Wife reports that starting 3 days ago, pt began regurgitating all food and liquids, even water. Pt did have an episode of hypoglycemia at home, but when wife tried to give him orange juice, he just regurgitated it back up. Pt reports that the food came up from his esophagus and not his stomach. Pt reports that he drinks butter pecan Nepro at home. Pt has remained NPO since admit and is now without adequate nutrition for 5 days. SLP at bedside during RD visit for evaluation. EGD negative for stricture. RD will order  supplements per SLP recommendations. If patient unable to advance diet, may need to consider nutrition support. Pt is at high refeed risk.    Per chart, pt down 13lbs(9%) over the past 6 months.   Medications reviewed and include: synthroid, 10% dextrose _0 /hr  Labs reviewed: Na 131(L), K 3.3(L), BUN 31(H), creat 7.33(H) P 4.2 wnl- 9/10 Wbc- 3.6(L) cbgs- 159, 109, 116 x 24 hrs AIC 5.5- 7/2  NUTRITION - FOCUSED PHYSICAL EXAM:    Most Recent Value  Orbital Region Moderate depletion  Upper Arm Region Severe depletion  Thoracic and Lumbar Region Severe depletion  Buccal Region Moderate depletion  Temple Region Severe depletion  Clavicle Bone Region Severe depletion  Clavicle and Acromion Bone Region Severe depletion  Scapular Bone Region Severe depletion  Dorsal Hand Severe depletion  Patellar Region Severe depletion  Anterior Thigh Region Severe depletion  Posterior Calf Region Severe depletion  Edema (RD Assessment) None  Hair Reviewed  Eyes Reviewed  Mouth Reviewed  Skin Reviewed  Nails Reviewed     Diet Order:   Diet Order            Diet NPO time specified  Diet effective now                EDUCATION NEEDS:   Education needs have been addressed  Skin:  Skin Assessment: Reviewed RN Assessment  Last BM:  9/14- type 7  Height:   Ht Readings from Last 1 Encounters:  02/18/20 _1  (  1.676 m)    Weight:   Wt Readings from Last 1 Encounters:  02/18/20 58 kg    Ideal Body Weight:  64.5 kg  BMI:  Body mass index is 20.64 kg/m.  Estimated Nutritional Needs:   Kcal:  1700-1900kcal/day  Protein:  85-95g/day  Fluid:  1.5-1.7L/day  Koleen Distance MS, RD, LDN Please refer to [facility] for RD and/or RD on-call/weekend/after hours pager

## 2020-02-18 NOTE — Anesthesia Postprocedure Evaluation (Signed)
Anesthesia Post Note  Patient: Kerry [name]  Procedure(s) Performed: ESOPHAGOGASTRODUODENOSCOPY (EGD) WITH PROPOFOL (N/A )  Patient location during evaluation: PACU Anesthesia Type: General Level of consciousness: awake and alert Pain management: pain level controlled Vital Signs Assessment: post-procedure vital signs reviewed and stable Respiratory status: spontaneous breathing, nonlabored ventilation and respiratory function stable Cardiovascular status: blood pressure returned to baseline and stable Postop Assessment: no apparent nausea or vomiting Anesthetic complications: no   No complications documented.   Last Vitals:  Vitals:   02/18/20 1200 02/18/20 1215  BP: 101/65 121/68  Pulse: 65 81  Resp: 16   Temp:    SpO2: 100% 100%    Last Pain:  Vitals:   02/18/20 1113  TempSrc: Oral  PainSc: 0-No pain                 Brett Canales Phong Isenberg

## 2020-02-18 NOTE — Op Note (Signed)
[facility] Gastroenterology Patient Name: Kerry [name] Procedure Date: 02/18/2020 7:25 AM MRN: [id number] Account #: [id number] Date of Birth: 09-01-1933 Admit Type: Inpatient Age: 86 Room: [facility] ENDO ROOM 4 Gender: Male Note Status: Finalized Procedure:             Upper GI endoscopy Indications:           Dysphagia Providers:             [name] MD, MD Referring MD:          No Local Md, MD (Referring MD) Medicines:             Monitored Anesthesia Care Complications:         No immediate complications. Procedure:             Pre-Anesthesia Assessment:                        - Prior to the procedure, a History and Physical was                         performed, and patient medications, allergies and                         sensitivities were reviewed. The patient's tolerance                         of previous anesthesia was reviewed.                        - The risks and benefits of the procedure and the                         sedation options and risks were discussed with the                         patient. All questions were answered and informed                         consent was obtained.                        - ASA Grade Assessment: III - A patient with severe                         systemic disease.                        After obtaining informed consent, the endoscope was                         passed under direct vision. Throughout the procedure,                         the patient's blood pressure, pulse, and oxygen                         saturations were monitored continuously. The Endoscope                         was introduced through the mouth, and advanced  to the                         third part of duodenum. The upper GI endoscopy was                         accomplished with ease. The patient tolerated the                         procedure well. Findings:      The examined duodenum was normal.      The stomach was normal.       The cardia and gastric fundus were normal on retroflexion.      An end of the tracheostomy tube seen in the proximal esophagus      The examined esophagus was normal. Impression:            - Normal examined duodenum.                        - Normal stomach.                        - [Type] were found in the esophagus.                        - Normal esophagus.                        - No specimens collected. Recommendation:        - Return patient to hospital ward for ongoing care.                        - 1. Modified barium swallow to rule out transfer                         dysphagia                        2. No stricture seen internally to dilate                        3. Tracheostomy tube end seen in the esophagus - am                         unsure if this is normal or not- suggest ENT to give                         their opinion Procedure Code(s):     --- Professional ---                        [phone], Esophagogastroduodenoscopy, flexible,                         transoral; diagnostic, including collection of                         specimen(s) by brushing or washing, when performed                         (separate procedure) Diagnosis Code(s):     ---  Professional ---                        [phone], Other foreign object in esophagus causing                         other injury, initial encounter                        R13.10, Dysphagia, unspecified CPT copyright 2019 [employer] Medical [employer]. All rights reserved. The codes documented in this report are preliminary and upon coder review may  be revised to meet current compliance requirements. [name], MD [name] MD, MD 02/18/2020 9:07:19 AM This report has been signed electronically. Number of Addenda: 0 Note Initiated On: 02/18/2020 7:25 AM Estimated Blood Loss:  Estimated blood loss: none.      [facility]

## 2020-02-18 NOTE — Progress Notes (Addendum)
PROGRESS NOTE    Kerry [name]   RCB:[id number]  DOB: 1934-05-14  PCP: Cecile Sheerer, MD    DOA: 02/13/2020 LOS: 5   Brief Narrative   Kerry [name] is a 86 y.o. 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  Vitals and labs in the ED were essentially unremarkable.  Labs consistent with ESRD, mild thrombocytopenia.    CT neck soft tissue showed "prominent debris within the oropharynx and upper esophagus which may reflect a food bolus. There is associated mild dilation of the upper esophagus. Findings raise the possibility of an upper esophageal stricture with obstruction.  Admitted for observation with GI consulted.  Barium esophagram on 9/10 confirmed severe focal esophageal narrowing at the level of the tracheostomy tube restricting passage of contrast most consistent with a high-grade stricture.  Eliquis is on hold.  Heparin infusion stopped 9/13 for worsening thrombocytopenia. Checking HIT Ab.    EGD by Dr. Vicente Males on 9/14 showed no stricture.  The end of the "tracheostomy tube" seen in the proximal esophagus (this is TEP plate, patient no longer has trach tube, only stoma).       Assessment & Plan   Principal Problem:   Difficulty swallowing Active Problems:   Anemia of chronic disease   Coronary artery disease   Hyperlipidemia   Hypertension   Squamous cell cancer of hypopharynx (HCC)   Acute deep vein thrombosis (DVT) of right upper extremity (HCC)   AF (paroxysmal atrial fibrillation) (HCC)   Chronic systolic CHF (congestive heart failure) (HCC)   ESRD (end stage renal disease) (HCC)   Hypoglycemia   COPD (chronic obstructive  pulmonary disease) (HCC)   Type II diabetes mellitus with renal manifestations ([place])   Difficulty swallowing due to esophageal stricture / narrowing - present on admission, to tolerance for any PO intake x 2 days prior to admission.  GI is consulted.   EGD under fluoroscopy on 9/14 with no stricture seen, but end of trach tube (TEP plate) seen in esophagus.  ENT and GI discussed case.  ENT felt outpatient follow up at [facility] appropriate.  Continue to hold Eliquis for possible G tube placement.   GI wants modified barium swallow study, pending.   SLP to evaluate - pending. Patient agreeable to G tube if needed, if so consult surgery. IR reviewed prior imaging today, no percutaneous window for them to access stomach, tube placement would be surgical.   Hypoglycemia - present on admission, improving.  Continue D10 infusion. CBG's every 4 hours to monitor.  D50 PRN.  Hold insulin.    Thrombocytopenia - mild, present on admission with platelets 123k>>108k>>101k>>95k>>95k (9/14).   Stopped heparin 9/13, check HIT antibody - follow up. If platelets stable or improving, resume heparin. Monitor CBC closely.  Hypokalemia - Replaced.  Cautious w/any replacement given ESRD.  Recommend K~4.0 given his A-fib.   Anemia of chronic disease - Hgb 13.0 on admission.  Monitor CBC.  PO iron supplement on hold.  Left great toe pain due ingrown nail - consulted podiatry.  Ingrown nail debrided on 9/10.  Daily mupirocin ointment and light bandage.  Follow up podiatry outpatient as needed.  Coronary artery disease - stable, no chest  pain.  Lipitor on hold.  Not on beta blocker.  Hyperlipidemia - Lipitor on hold  Type II diabetes mellitus with renal manifestations -  Recent A1c 5.5, well controlled.  Patient taking Lantus and glipizide.   Presented with hypoglycemia.  Hold Lantus and glipizide.   Hx of Hypertension - BP soft on admission, likely due to no PO intake and hypovolemia.  Is also on midodrine at  home. -IV hydralazine as needed.  Hold midodrine until after EGD.  Squamous cell cancer of hypopharynx - s/p of tracheostomy with stoma on trach collar, has tracheoesophageal puncture in place.  Follow-up with ENT at [facility].  Acute DVT of right upper extremity - stable. Hold Eliquis as above.  Resume heparin gtt if platelets stabilize.  Paroxysmal A-fib - rate controlled.  PO amiodarone on hold.  Per cardiology, they'll consider transition to amio gtt if needed.  Hold Eliquis as above.  Resume heparin gtt if platelets stabilize.  Chronic systolic CHF - appears euvolemic, asymptomatic and currently well-compensated.    Echo on 11/30/2019 showed EF of 20-30%.  Volume management by dialysis.  ESRD - on T/Th/S hemodialysis.  Nephrology consulted for dialysis. Will get tomorrow.  COPD - albuterol nebs PRN   Patient BMI: Body mass index is 20.64 kg/m.   DVT prophylaxis:    Diet:  Diet Orders (From admission, onward)    Start     Ordered   02/18/20 1139  Diet NPO time specified  Diet effective now        02/18/20 1139            Code Status: Full Code    Subjective 02/18/20    Patient seen with wife at bedside this AM.  EGD was done and no stricture.  We discussed next plans.  Patient is agreeable to G tube and artificial feeds if needed, says he will "do whatever needs to be done".  Very hungry, weak.  No other complaints.   Disposition Plan & Communication   Status is: Inpatient  Remains inpatient appropriate because:Ongoing diagnostic testing needed not appropriate for outpatient work up.  MBSS, SLP evaluation pending, may require G tube placement before d/c.   Dispo: The patient is from: Home              Anticipated d/c is to: Home              Anticipated d/c date is: 2 days              Patient currently is not medically stable to d/c.   Family Communication: wife at bedside during encounter, 9/14   Consults, Procedures, Significant Events    Consultants:   Gastroenterology  Nephrology  Cardiology  Procedures:   Barium esophagram 9/10  Antimicrobials:   None    Objective   Vitals:   02/18/20 1415 02/18/20 1430 02/18/20 1445 02/18/20 1447  BP: (!) 100/56 (!) _0  Pulse: 62 62 (!) 59 (!) 58  Resp:      Temp:      TempSrc:      SpO2: (!) 85% 98% 90% 95%  Weight:      Height:        Intake/Output Summary (Last 24 hours) at 02/18/2020 1529 Last data filed at 02/18/2020 1447 Gross per 24 hour  Intake 900.04 ml  Output 2000 ml  Net -1099.96 ml   Filed Weights   02/13/20 1703 02/14/20 0100 02/18/20 0756  Weight: 57.7 kg 58 kg 58  kg    Physical Exam:  General exam: awake, alert, no acute distress, frail HEENT: stoma appears clean and without secretions, trach collar in place Respiratory system: CTAB, no wheezes, rales or rhonchi, normal respiratory effort. Cardiovascular system: normal S1/S2, RRR, no pedal edema.   Gastrointestinal: soft, nontender, +bowel sounds   Labs   Data Reviewed: I have personally reviewed following labs and imaging studies  CBC: Recent Labs  Lab 02/14/20 0103 02/15/20 0820 02/16/20 0251 02/17/20 0454 02/18/20 0550  WBC 4.6 3.8* 4.2 3.8* 3.6*  HGB 13.2 12.5* 12.5* 12.1* 12.2*  HCT 36.0* 35.3* 33.9* 33.4* 33.8*  MCV 90.7 91.5 88.3 89.5 89.7  PLT 123* 108* 101* 95* 95*   Basic Metabolic Panel: Recent Labs  Lab 02/13/20 1007 02/14/20 0103 02/14/20 2254 02/15/20 0820 02/17/20 0454 02/18/20 0550  NA 141 139  --  134* 134* 131*  K 4.5 3.7  --  3.9 3.3* 3.3*  CL 98 97*  --  96* 95* 94*  CO2 25 29  --  _0 GLUCOSE 88 119*  --  73 115* 149*  BUN 54* 25*  --  35* 26* 31*  CREATININE 7.00* 4.44*  --  6.69* 6.12* 7.33*  CALCIUM 9.1 8.6*  --  8.5* 8.6* 8.6*  MG  --   --   --   --  1.9  --   PHOS  --   --  4.2  --   --   --    GFR: Estimated Creatinine Clearance: 6.2 mL/min (A) (by C-G formula based on SCr of 7.33 mg/dL (H)). Liver Function  Tests: Recent Labs  Lab 02/12/20 1921  AST 19  ALT 21  ALKPHOS 54  BILITOT 1.4*  PROT 7.6  ALBUMIN 4.3   Recent Labs  Lab 02/12/20 1921  LIPASE 26   No results for input(s): AMMONIA in the last 168 hours. Coagulation Profile: Recent Labs  Lab 02/13/20 1009  INR 1.8*   Cardiac Enzymes: No results for input(s): CKTOTAL, CKMB, CKMBINDEX, TROPONINI in the last 168 hours. BNP (last 3 results) No results for input(s): PROBNP in the last 8760 hours. HbA1C: No results for input(s): HGBA1C in the last 72 hours. CBG: Recent Labs  Lab 02/17/20 1437 02/17/20 1614 02/17/20 2136 02/17/20 2321 02/18/20 1039  GLUCAP 166* 156* 174* 152* 109*   Lipid Profile: No results for input(s): CHOL, HDL, LDLCALC, TRIG, CHOLHDL, LDLDIRECT in the last 72 hours. Thyroid Function Tests: No results for input(s): TSH, T4TOTAL, FREET4, T3FREE, THYROIDAB in the last 72 hours. Anemia Panel: No results for input(s): VITAMINB12, FOLATE, FERRITIN, TIBC, IRON, RETICCTPCT in the last 72 hours. Sepsis Labs: No results for input(s): PROCALCITON, LATICACIDVEN in the last 168 hours.  Recent Results (from the past 240 hour(s))  SARS Coronavirus 2 by RT PCR (hospital order, performed in [facility] hospital lab) Nasopharyngeal Nasopharyngeal Swab     Status: None   Collection Time: 02/13/20  7:53 AM   Specimen: Nasopharyngeal Swab  Result Value Ref Range Status   SARS Coronavirus 2 NEGATIVE NEGATIVE Final    Comment: (NOTE) SARS-CoV-2 target nucleic acids are NOT DETECTED.  The SARS-CoV-2 RNA is generally detectable in upper and lower respiratory specimens during the acute phase of infection. The lowest concentration of SARS-CoV-2 viral copies this assay can detect is 250 copies / mL. A negative result does not preclude SARS-CoV-2 infection and should not be used as the sole basis for treatment or other patient management decisions.  A negative  result may occur with improper specimen collection /  handling, submission of specimen other than nasopharyngeal swab, presence of viral mutation(s) within the areas targeted by this assay, and inadequate number of viral copies (<250 copies / mL). A negative result must be combined with clinical observations, patient history, and epidemiological information.  Fact Sheet for Patients:   StrictlyIdeas.no  Fact Sheet for Healthcare Providers: BankingDealers.co.za  This test is not yet approved or  cleared by the [place] FDA and has been authorized for detection and/or diagnosis of SARS-CoV-2 by FDA under an Emergency Use Authorization (EUA).  This EUA will remain in effect (meaning this test can be used) for the duration of the COVID-19 declaration under Section 564(b)(1) of the Act, 21 U.S.C. section 360bbb-3(b)(1), unless the authorization is terminated or revoked sooner.  Performed at [facility], [address], [place], [place] [address]   Surgical PCR screen     Status: None   Collection Time: 02/14/20  1:21 AM   Specimen: Nasal Mucosa; Nasal Swab  Result Value Ref Range Status   MRSA, PCR NEGATIVE NEGATIVE Final   Staphylococcus aureus NEGATIVE NEGATIVE Final    Comment: (NOTE) The Xpert SA Assay (FDA approved for NASAL specimens in patients 28 years of age and older), is one component of a comprehensive surveillance program. It is not intended to diagnose infection nor to guide or monitor treatment. Performed at [facility], [address], [place], [place] [address]       Imaging Studies   No results found.   Medications   Scheduled Meds: . amiodarone  100 mg Oral Daily  . chlorhexidine  15 mL Mouth Rinse BID  . chlorhexidine gluconate (MEDLINE KIT)  15 mL Mouth Rinse BID  . levothyroxine  25 mcg Intravenous Q0600  . mouth rinse  15 mL Mouth Rinse q12n4p  . sodium chloride flush  3 mL Intravenous Q12H   Continuous Infusions: . sodium  chloride    . dextrose 50 mL/hr at 02/18/20 1050       LOS: 5 days    Time spent: 36 minutes with > 50% spent in coordination of care and direct patient contact    Ezekiel Slocumb, DO [facility]  02/18/2020, 3:29 PM    If 7PM-7AM, please contact night-coverage. How to contact the [facility] Attending or Consulting provider [place] or covering provider during after hours [place], for this patient?    1. Check the care team in [facility] and look for a) attending/consulting [facility] provider listed and b) the [facility] team listed 2. Log into [email or web address] and use [place]'s universal password to access. If you do not have the password, please contact the hospital operator. 3. Locate the [facility] provider you are looking for under [facility] and page to a number that you can be directly reached. 4. If you still have difficulty reaching the provider, please page the [facility] (Director on Call) for the Hospitalists listed on amion for assistance.

## 2020-02-19 LAB — BASIC METABOLIC PANEL
Anion gap: 11 (ref 5–15)
BUN: 17 mg/dL (ref 8–23)
CO2: 28 mmol/L (ref 22–32)
Calcium: 8.1 mg/dL — ABNORMAL LOW (ref 8.9–10.3)
Chloride: 92 mmol/L — ABNORMAL LOW (ref 98–111)
Creatinine, Ser: 5.03 mg/dL — ABNORMAL HIGH (ref 0.61–1.24)
GFR calc Af Amer: 11 mL/min — ABNORMAL LOW (ref >60)
GFR calc non Af Amer: 10 mL/min — ABNORMAL LOW (ref >60)
Glucose, Bld: 138 mg/dL — ABNORMAL HIGH (ref 70–99)
Potassium: 2.8 mmol/L — ABNORMAL LOW (ref 3.5–5.1)
Sodium: 131 mmol/L — ABNORMAL LOW (ref 135–145)

## 2020-02-19 LAB — CBC
HCT: 34.3 % — ABNORMAL LOW (ref 39.0–52.0)
Hemoglobin: 12.5 g/dL — ABNORMAL LOW (ref 13.0–17.0)
MCH: 32.6 pg (ref 26.0–34.0)
MCHC: 36.4 g/dL — ABNORMAL HIGH (ref 30.0–36.0)
MCV: 89.3 fL (ref 80.0–100.0)
Platelets: 107 10*3/uL — ABNORMAL LOW (ref 150–400)
RBC: 3.84 MIL/uL — ABNORMAL LOW (ref 4.22–5.81)
RDW: 16.1 % — ABNORMAL HIGH (ref 11.5–15.5)
WBC: 4.1 10*3/uL (ref 4.0–10.5)
nRBC: 0 % (ref 0.0–0.2)

## 2020-02-19 LAB — GLUCOSE, CAPILLARY
Glucose-Capillary: 139 mg/dL — ABNORMAL HIGH (ref 70–99)
Glucose-Capillary: 140 mg/dL — ABNORMAL HIGH (ref 70–99)
Glucose-Capillary: 158 mg/dL — ABNORMAL HIGH (ref 70–99)
Glucose-Capillary: 160 mg/dL — ABNORMAL HIGH (ref 70–99)
Glucose-Capillary: 178 mg/dL — ABNORMAL HIGH (ref 70–99)
Glucose-Capillary: 197 mg/dL — ABNORMAL HIGH (ref 70–99)

## 2020-02-19 LAB — PHOSPHORUS: Phosphorus: 3.6 mg/dL (ref 2.5–4.6)

## 2020-02-19 LAB — HEPARIN LEVEL (UNFRACTIONATED): Heparin Unfractionated: 1.49 IU/mL — ABNORMAL HIGH (ref 0.30–0.70)

## 2020-02-19 LAB — APTT: aPTT: >160 seconds — ABNORMAL HIGH (ref 24–36)

## 2020-02-19 LAB — MAGNESIUM: Magnesium: 1.8 mg/dL (ref 1.7–2.4)

## 2020-02-19 LAB — HEPARIN INDUCED PLATELET AB (HIT ANTIBODY): Heparin Induced Plt Ab: 0.214 OD (ref 0.000–0.400)

## 2020-02-19 MED ORDER — HEPARIN (PORCINE) 25000 UT/250ML-% IV SOLN: 650.0000 [IU]/h | INTRAVENOUS | Status: DC

## 2020-02-19 MED ORDER — POTASSIUM CHLORIDE 10 MEQ/100ML IV SOLN
10.0000 meq | INTRAVENOUS | Status: DC
  Filled 2020-02-19 (×3): qty 100

## 2020-02-19 MED ORDER — MUPIROCIN 2 % EX OINT
1.0000 "application " | TOPICAL_OINTMENT | Freq: Two times a day (BID) | CUTANEOUS | Status: DC
  Administered 2020-02-19 – 2020-02-20 (×4): 1 via TOPICAL
  Filled 2020-02-19 (×2): qty 22

## 2020-02-19 MED ORDER — HEPARIN (PORCINE) 25000 UT/250ML-% IV SOLN
900.0000 [IU]/h | INTRAVENOUS | Status: DC
  Administered 2020-02-19: 900 [IU]/h via INTRAVENOUS
  Filled 2020-02-19: qty 250

## 2020-02-19 MED ORDER — POTASSIUM CHLORIDE 20 MEQ/15ML (10%) PO SOLN
40.0000 meq | ORAL | Status: AC
  Administered 2020-02-19 – 2020-02-20 (×3): 40 meq via ORAL
  Filled 2020-02-19 (×4): qty 30

## 2020-02-19 MED ORDER — ADULT MULTIVITAMIN LIQUID CH
15.0000 mL | Freq: Every day | ORAL | Status: DC
  Administered 2020-02-20: 15 mL via ORAL
  Filled 2020-02-19: qty 15

## 2020-02-19 NOTE — Progress Notes (Signed)
[place] for Heparin Infusion Dosing  Indication: atrial fibrillation and DVT  Allergies  Allergen Reactions  . Ace Inhibitors Other (See Comments)    Potassium elevated  . Gabapentin Other (See Comments)    Ataxia at 100 mg.  . Losartan Other (See Comments)    Hyperkalemia   Patient Measurements: Height: 5\' 6"  (167.6 cm) Weight: 57.3 kg (126 lb 4.8 oz) IBW/kg (Calculated) : 63.8  Heparin dosing wt 58kg  Vital Signs: Temp: 97.7 F (36.5 C) (09/15 2011) Temp Source: Oral (09/15 2011) BP: 106/76 (09/15 2011) Pulse Rate: 76 (09/15 2011)  Labs: Recent Labs    02/17/20 0454 02/17/20 0454 02/17/20 1342 02/18/20 0028 02/18/20 0550 02/19/20 0500 02/19/20 2128  HGB 12.1*   < >  --   --  12.2* 12.5*  --   HCT 33.4*  --   --   --  33.8* 34.3*  --   PLT 95*  --   --   --  95* 107*  --   APTT 141*  --    < > 43* 43*  --  >160*  HEPARINUNFRC 1.80*  --   --   --  1.42*  --  1.49*  CREATININE 6.12*  --   --   --  7.33* 5.03*  --    < > = values in this interval not displayed.   Estimated Creatinine Clearance: 8.9 mL/min (A) (by C-G formula based on SCr of 5.03 mg/dL (H)).  Medical History: Past Medical History:  Diagnosis Date  . Cancer ([place])   . CHF (congestive heart failure) ([place])   . COPD (chronic obstructive pulmonary disease) ([place])   . Diabetes mellitus without complication ([place])   . Hypertension   . Renal disorder    Assessment: 86 yo male with PMH of RUE DVT and A. Fib (CHADSVASC 6). Patient taking Apixaban (Eliquis) PTA, but presents with difficulty swallowing, nausea/vomiting. Patient reports last dose of apixaban 9/8 AM, but he threw up shortly after. . Baseline aPTT, INR, and anti-Xa level elevated. Holding Eliquis d/t planned EGD. Heparin was stopped on 9/13 for procedures and possible HIT. 4Ts score indicated low probability of HIT. HIT panel still in process. Pharmacy consulted for restarting heparin infusion.  Previous  heparin drip from 9/9-9/13 09/10 2300 aPTT 128 sec supratherapeutic - rate reduced to 550 units/hr 09/11 0820 aPTT 59 sec slightly subtherapeutic (heparin level still elevated from Eliquis) 09/11 1817 aPTT 62 sec slightly subtherapeutic 09/12 @ 0300 aPTT 91 sec therapeutic x 1 09/12 @ 1019 aPTT 113 sec slightly supratherapeutic (750 units/hr) 09/12 @ 1911 aPTT 61 sec slightly subtherapeutic 09/13 @ 0454 aPTT 141 seconds, HL 1.80 both supratherapeutic and non correlative.  09/13 @1342  aPTT 61 seconds, subtherapeutic  9/15 2128 aPTT > 160, HL 1.49, SUPRAtherapeutic, d/w nursing  Plt 153>123>108>95>107  When heparin was first started 9/9 he did not receive a bolus and first aPTT was supratherapeutic after starting him on 950 units/hr. Pt has been off of heparin since 9/13. Hgb is stable and Plt have recovered. Due to previously supratherapeutic aPTT with no bolus on rate of 950units/hr, will not bolus today and will start at a lower rate.  Goal of Therapy:  Heparin level 0.3-0.7 units/ml once HL and aPTT correlate aPTT 66-102 seconds Monitor platelets by anticoagulation protocol: Yes   Plan:  Will hold heparin x 1 hour then restart at 700 units/hr  Will recheck aPTT and HL in 8 hours and CBC w/  am labs  Ena Dawley, PharmD 02/19/2020 10:46 PM

## 2020-02-19 NOTE — Progress Notes (Addendum)
PROGRESS NOTE    Minor Iden   TJQ:[id number]  DOB: Aug 31, 1933  PCP: Cecile Sheerer, MD    DOA: 02/13/2020 LOS: 6   Brief Narrative   Kerry [name] is a 86 y.o. 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  Vitals and labs in the ED were essentially unremarkable.  Labs consistent with ESRD, mild thrombocytopenia.    CT neck soft tissue showed "prominent debris within the oropharynx and upper esophagus which may reflect a food bolus. There is associated mild dilation of the upper esophagus. Findings raise the possibility of an upper esophageal stricture with obstruction.  Admitted for observation with GI consulted.  Barium esophagram on 9/10 confirmed severe focal esophageal narrowing at the level of the tracheostomy tube restricting passage of contrast most consistent with a high-grade stricture.  Eliquis is on hold.  Heparin infusion stopped 9/13 for worsening thrombocytopenia. Checking HIT Ab.    EGD by Dr. Vicente Males on 9/14 showed no stricture.  The end of the "tracheostomy tube" seen in the proximal esophagus (this is TEP plate, patient no longer has trach tube, only stoma).       Assessment & Plan   Principal Problem:   Difficulty swallowing Active Problems:   Anemia of chronic disease   Coronary artery disease   Hyperlipidemia   Hypertension   Squamous cell cancer of hypopharynx (HCC)   Acute deep vein thrombosis (DVT) of right upper extremity (HCC)   AF (paroxysmal atrial fibrillation) (HCC)   Chronic systolic CHF (congestive heart failure) (HCC)   ESRD (end stage renal disease) (HCC)   Hypoglycemia   COPD (chronic obstructive  pulmonary disease) (HCC)   Type II diabetes mellitus with renal manifestations (HCC)   Protein-calorie malnutrition, severe   Difficulty swallowing due to esophageal stricture / narrowing - present on admission, to tolerance for any PO intake x 2 days prior to admission.  GI is consulted.   EGD under fluoroscopy on 9/14 with no stricture seen, but end of trach tube (TEP plate) seen in esophagus.  ENT and GI discussed case.  ENT felt outpatient follow up at [facility].  Continue to hold Eliquis for possible G tube placement, though looking less likely   Barium swallow shows severe focal esophageal narrowing EGD normal SLP has seen, diet advanced to dysphagia 2 today Patient agreeable to G tube if needed, if so consult surgery. IR reviewed prior imaging today, no percutaneous window for them to access stomach, tube placement would be surgical.  Hypoglycemia - present on admission, resolved.   - stop d50 infusion, monitor CBGs  Thrombocytopenia - mild, present on admission with platelets 123k>>108k>>101k>>95k>>95k (9/14).   Stopped heparin 9/13, check HIT antibody > negative Platelets stable today 107, will re-start heparin Monitor CBC closely.  Hypokalemia - Replaced.  Today 2.8, ordering 30 meq IV heparin. Will repeat PM potassium. Cautious w/any replacement given ESRD.  Recommend K~4.0 given his A-fib.   Anemia of chronic disease - Hgb 13.0 on admission.  Monitor CBC.  PO iron supplement on hold.  Left great toe pain due ingrown nail - consulted podiatry.  Ingrown nail debrided on 9/10.  Daily mupirocin ointment and light bandage.  Daily warm  soaks  Coronary artery disease - stable, no chest pain.  Lipitor on hold.  Not on beta blocker.  Hyperlipidemia - Lipitor on hold  Type II diabetes mellitus with renal manifestations -  Recent A1c 5.5, well controlled.  Patient taking Lantus and glipizide.   Presented with hypoglycemia.  Hold Lantus and glipizide.   Hx of  Hypertension - BP soft on admission, likely due to no PO intake and hypovolemia.  Is also on midodrine at home. -IV hydralazine as needed.  Hold midodrine until after EGD.  Squamous cell cancer of hypopharynx - s/p of tracheostomy with stoma on trach collar, has tracheoesophageal puncture in place.  Follow-up with ENT at [facility].  Acute DVT of right upper extremity - stable. Resuming heparin as above  Paroxysmal A-fib - rate controlled.  PO amiodarone on hold.  Per cardiology, they'll consider transition to amio gtt if needed.  Hold Eliquis as above.  Heparin as above  Chronic systolic CHF - appears euvolemic, asymptomatic and currently well-compensated.    Echo on 11/30/2019 showed EF of 20-30%.  Volume management by dialysis.  ESRD - on T/Th/S hemodialysis.  Nephrology consulted for dialysis. Will get tomorrow.  COPD - albuterol nebs PRN   Patient BMI: Body mass index is 20.39 kg/m.   DVT prophylaxis:    Diet:  Diet Orders (From admission, onward)    Start     Ordered   02/19/20 1159  DIET DYS 2 Room service appropriate? Yes with Assist; Fluid consistency: Thin  Diet effective now       Comments: No straws.  Extra Gravy on meats, potatoes. Soups at meals. Jellos.  Question Answer Comment  Room service appropriate? Yes with Assist   Fluid consistency: Thin      02/19/20 1158            Code Status: Full Code    Subjective 02/19/20    Patient seen with wife at bedside this AM.  EGD was done and no stricture.  We discussed next plans.  Tolerated liquids, jello, and grits this morning. No complaints   Disposition Plan & Communication   Status is: Inpatient  Remains inpatient appropriate because:Ongoing diagnostic testing needed not appropriate for outpatient work up.  MBSS, SLP evaluation pending, may require G tube placement before d/c.   Dispo: The patient is from: Home              Anticipated d/c is to: Home              Anticipated d/c date is: tomorrow               Patient currently is not medically stable to d/c.   Family Communication: wife at bedside during encounter   Consults, Procedures, Significant Events   Consultants:   Gastroenterology  Nephrology  Cardiology  Procedures:   Barium esophagram 9/10  Antimicrobials:   None    Objective   Vitals:   02/19/20 0540 02/19/20 0758 02/19/20 1059 02/19/20 1129  BP:  104/64 (!) 101/58   Pulse:  63 68   Resp:  17 17   Temp:  97.7 F (36.5 C) 97.6 F (36.4 C)   TempSrc:  Oral Oral   SpO2:  100% 100% 100%  Weight: 57.3 kg     Height:        Intake/Output Summary (Last 24 hours) at 02/19/2020 1206 Last data filed at 02/19/2020 1008 Gross per 24 hour  Intake 480 ml  Output 2000 ml  Net -1520 ml   Filed Weights   02/14/20 0100 02/18/20 0756 02/19/20 0540  Weight: 58 kg 58 kg 57.3 kg    Physical Exam:  General exam: awake, alert, no acute distress, frail HEENT: stoma appears clean and without secretions, trach collar in place Respiratory system: CTAB, no wheezes, rales or rhonchi, normal respiratory effort. Cardiovascular system: normal S1/S2, RRR, no pedal edema.   Gastrointestinal: soft, nontender, +bowel sounds   Labs   Data Reviewed: I have personally reviewed following labs and imaging studies  CBC: Recent Labs  Lab 02/15/20 0820 02/16/20 0251 02/17/20 0454 02/18/20 0550 02/19/20 0500  WBC 3.8* 4.2 3.8* 3.6* 4.1  HGB 12.5* 12.5* 12.1* 12.2* 12.5*  HCT 35.3* 33.9* 33.4* 33.8* 34.3*  MCV 91.5 88.3 89.5 89.7 89.3  PLT 108* 101* 95* 95* 759*   Basic Metabolic Panel: Recent Labs  Lab 02/14/20 0103 02/14/20 2254 02/15/20 0820 02/17/20 0454 02/18/20 0550 02/19/20 0500  NA 139  --  134* 134* 131* 131*  K 3.7  --  3.9 3.3* 3.3* 2.8*  CL 97*  --  96* 95* 94* 92*  CO2 29  --  _0 GLUCOSE 119*  --  73 115* 149* 138*  BUN 25*  --  35* 26* 31* 17  CREATININE 4.44*  --  6.69* 6.12* 7.33* 5.03*  CALCIUM 8.6*  --  8.5* 8.6* 8.6* 8.1*    MG  --   --   --  1.9  --  1.8  PHOS  --  4.2  --   --   --  3.6   GFR: Estimated Creatinine Clearance: 8.9 mL/min (A) (by C-G formula based on SCr of 5.03 mg/dL (H)). Liver Function Tests: Recent Labs  Lab 02/12/20 1921  AST 19  ALT 21  ALKPHOS 54  BILITOT 1.4*  PROT 7.6  ALBUMIN 4.3   Recent Labs  Lab 02/12/20 1921  LIPASE 26   No results for input(s): AMMONIA in the last 168 hours. Coagulation Profile: Recent Labs  Lab 02/13/20 1009  INR 1.8*   Cardiac Enzymes: No results for input(s): CKTOTAL, CKMB, CKMBINDEX, TROPONINI in the last 168 hours. BNP (last 3 results) No results for input(s): PROBNP in the last 8760 hours. HbA1C: No results for input(s): HGBA1C in the last 72 hours. CBG: Recent Labs  Lab 02/18/20 1558 02/18/20 1801 02/18/20 2201 02/19/20 0139 02/19/20 1017  GLUCAP 116* 137* 148* 158* 197*   Lipid Profile: No results for input(s): CHOL, HDL, LDLCALC, TRIG, CHOLHDL, LDLDIRECT in the last 72 hours. Thyroid Function Tests: No results for input(s): TSH, T4TOTAL, FREET4, T3FREE, THYROIDAB in the last 72 hours. Anemia Panel: No results for input(s): VITAMINB12, FOLATE, FERRITIN, TIBC, IRON, RETICCTPCT in the last 72 hours. Sepsis Labs: No results for input(s): PROCALCITON, LATICACIDVEN in the last 168 hours.  Recent Results (from the past 240 hour(s))  SARS Coronavirus 2 by RT PCR (hospital order, performed in [facility] hospital lab) Nasopharyngeal Nasopharyngeal Swab     Status: None   Collection Time: 02/13/20  7:53 AM   Specimen: Nasopharyngeal Swab  Result Value Ref Range Status   SARS Coronavirus 2 NEGATIVE NEGATIVE Final    Comment: (NOTE) SARS-CoV-2 target nucleic acids are NOT DETECTED.  The SARS-CoV-2 RNA is generally detectable in upper and lower respiratory specimens during the acute phase of infection. The lowest concentration of SARS-CoV-2 viral copies this assay can detect is 250 copies / mL. A negative result does not  preclude SARS-CoV-2  infection and should not be used as the sole basis for treatment or other patient management decisions.  A negative result may occur with improper specimen collection / handling, submission of specimen other than nasopharyngeal swab, presence of viral mutation(s) within the areas targeted by this assay, and inadequate number of viral copies (<250 copies / mL). A negative result must be combined with clinical observations, patient history, and epidemiological information.  Fact Sheet for Patients:   StrictlyIdeas.no  Fact Sheet for Healthcare Providers: BankingDealers.co.za  This test is not yet approved or  cleared by the [place] FDA and has been authorized for detection and/or diagnosis of SARS-CoV-2 by FDA under an Emergency Use Authorization (EUA).  This EUA will remain in effect (meaning this test can be used) for the duration of the COVID-19 declaration under Section 564(b)(1) of the Act, 21 U.S.C. section 360bbb-3(b)(1), unless the authorization is terminated or revoked sooner.  Performed at [facility], [address], [place], [place] [address]   Surgical PCR screen     Status: None   Collection Time: 02/14/20  1:21 AM   Specimen: Nasal Mucosa; Nasal Swab  Result Value Ref Range Status   MRSA, PCR NEGATIVE NEGATIVE Final   Staphylococcus aureus NEGATIVE NEGATIVE Final    Comment: (NOTE) The Xpert SA Assay (FDA approved for NASAL specimens in patients 53 years of age and older), is one component of a comprehensive surveillance program. It is not intended to diagnose infection nor to guide or monitor treatment. Performed at [facility], [address] [place], [place] [address]       Imaging Studies   No results found.   Medications   Scheduled Meds: . amiodarone  100 mg Oral Daily  . chlorhexidine  15 mL Mouth Rinse BID  . chlorhexidine gluconate (MEDLINE KIT)  15  mL Mouth Rinse BID  . feeding supplement (NEPRO CARB STEADY)  237 mL Oral TID BM  . levothyroxine  25 mcg Intravenous Q0600  . mouth rinse  15 mL Mouth Rinse q12n4p  . multivitamin  1 tablet Oral QHS  . multivitamin  15 mL Per Tube Daily  . sodium chloride flush  3 mL Intravenous Q12H   Continuous Infusions: . sodium chloride    . dextrose 50 mL/hr at 02/18/20 1735  . potassium chloride         LOS: 6 days    Time spent: 36 minutes with > 50% spent in coordination of care and direct patient contact    Desma Maxim, MD [facility]  02/19/2020, 12:06 PM    If 7PM-7AM, please contact night-coverage. How to contact the [facility] Attending or Consulting provider [place] or covering provider during after hours [place], for this patient?    1. Check the care team in [facility] and look for a) attending/consulting [facility] provider listed and b) the [facility] team listed 2. Log into [email or web address] and use [place]'s universal password to access. If you do not have the password, please contact the hospital operator. 3. Locate the [facility] provider you are looking for under [facility] and page to a number that you can be directly reached. 4. If you still have difficulty reaching the provider, please page the [facility] (Director on Call) for the Hospitalists listed on amion for assistance.

## 2020-02-19 NOTE — Treatment Plan (Signed)
Pt to unit with wife at bedside at this time. Pt AxOx4. Pt given KCL 49meq liquid late as it was not given in PCU. Anderson Malta in tele called and made aware that patient is now on monitor in room 223. Pt has noted skin issue to the left great toe. NO dressing is on patient at this time. Mupirocin ointment ordered from pharmacy. Pt and wife state they were not given nepro as ordered. This Probation officer got requested nepro and gave to patient. Toe was soaked in soapy water and washed. Mupirocin was applied. Toe is red, unattached edges, painful to touch per patient. Some epithelization present. Toe was then wrapped in gauze and tape. Pt tolerated well. Pt and wife oriented to unit at this time. Denies further questions. Skin check with Minette Brine, RN.

## 2020-02-19 NOTE — Progress Notes (Signed)
PT Cancellation Note  Patient Details Name: [name] MRN: [id number] DOB: 05-Dec-1933   Cancelled Treatment:    Reason Eval/Treat Not Completed: Other (comment). Chart reviewed. K+ trending down and is currently at 2.8 this date. Not appropriate for physical exertion at this time. Will re-attempt.   Alyannah Sanks 02/19/2020, 9:16 AM Greggory Stallion, PT, DPT [phone]

## 2020-02-19 NOTE — Progress Notes (Signed)
[facility]  ROUNDING NOTE   Subjective:  Patient underwent dialysis treatment yesterday. Tolerated well. Next Alysis treatment tomorrow.   Objective:  Vital signs in last 24 hours:  Temp:  [97.6 F (36.4 C)-98.1 F (36.7 C)] 97.7 F (36.5 C) (09/15 0758) Pulse Rate:  [48-86] 63 (09/15 0758) Resp:  [14-22] 17 (09/15 0758) BP: (95-121)/(52-79) 104/64 (09/15 0758) SpO2:  [85 %-100 %] 100 % (09/15 0758) Weight:  [57.3 kg] 57.3 kg (09/15 0540)  Weight change:  Filed Weights   02/14/20 0100 02/18/20 0756 02/19/20 0540  Weight: 58 kg 58 kg 57.3 kg    Intake/Output: I/O last 3 completed shifts: In: 794.2 [P.O.:240; I.V.:554.2] Out: 2000 [Other:2000]   Intake/Output this shift:  No intake/output data recorded.  Physical Exam: General:  No acute distress  Head:  Normocephalic, atraumatic. Moist oral mucosal membranes  Eyes:  Anicteric  Neck:  Tracheostomy collar on   Lungs:   Clear bilateral, normal effort  Heart:  Regular, S1S2 no rubs or gallop  Abdomen:   Nontender, non distended  Extremities:  No peripheral edema.  Neurologic:  Awake, alert, attempts to converse through tracheostomy  Skin:  No acute lesions or rashes  Access:  Left upper extremity AV graft    Basic Metabolic Panel: Recent Labs  Lab 02/14/20 0103 02/14/20 0103 02/14/20 2254 02/15/20 0820 02/15/20 0820 02/17/20 0454 02/18/20 0550 02/19/20 0500  NA 139  --   --  134*  --  134* 131* 131*  K 3.7  --   --  3.9  --  3.3* 3.3* 2.8*  CL 97*  --   --  96*  --  95* 94* 92*  CO2 29  --   --  23  --  _0 GLUCOSE 119*  --   --  73  --  115* 149* 138*  BUN 25*  --   --  35*  --  26* 31* 17  CREATININE 4.44*  --   --  6.69*  --  6.12* 7.33* 5.03*  CALCIUM 8.6*   < >  --  8.5*   < > 8.6* 8.6* 8.1*  MG  --   --   --   --   --  1.9  --  1.8  PHOS  --   --  4.2  --   --   --   --  3.6   < > = values in this interval not displayed.    Liver Function Tests: Recent Labs  Lab  02/12/20 1921  AST 19  ALT 21  ALKPHOS 54  BILITOT 1.4*  PROT 7.6  ALBUMIN 4.3   Recent Labs  Lab 02/12/20 1921  LIPASE 26   No results for input(s): AMMONIA in the last 168 hours.  CBC: Recent Labs  Lab 02/15/20 0820 02/16/20 0251 02/17/20 0454 02/18/20 0550 02/19/20 0500  WBC 3.8* 4.2 3.8* 3.6* 4.1  HGB 12.5* 12.5* 12.1* 12.2* 12.5*  HCT 35.3* 33.9* 33.4* 33.8* 34.3*  MCV 91.5 88.3 89.5 89.7 89.3  PLT 108* 101* 95* 95* 107*    Cardiac Enzymes: No results for input(s): CKTOTAL, CKMB, CKMBINDEX, TROPONINI in the last 168 hours.  BNP: Invalid input(s): POCBNP  CBG: Recent Labs  Lab 02/18/20 1039 02/18/20 1558 02/18/20 1801 02/18/20 2201 02/19/20 0139  GLUCAP 109* 116* 137* 148* 158*    Microbiology: Results for orders placed or performed during the hospital encounter of 02/13/20  SARS Coronavirus 2 by RT PCR (hospital  order, performed in [facility] hospital lab) Nasopharyngeal Nasopharyngeal Swab     Status: None   Collection Time: 02/13/20  7:53 AM   Specimen: Nasopharyngeal Swab  Result Value Ref Range Status   SARS Coronavirus 2 NEGATIVE NEGATIVE Final    Comment: (NOTE) SARS-CoV-2 target nucleic acids are NOT DETECTED.  The SARS-CoV-2 RNA is generally detectable in upper and lower respiratory specimens during the acute phase of infection. The lowest concentration of SARS-CoV-2 viral copies this assay can detect is 250 copies / mL. A negative result does not preclude SARS-CoV-2 infection and should not be used as the sole basis for treatment or other patient management decisions.  A negative result may occur with improper specimen collection / handling, submission of specimen other than nasopharyngeal swab, presence of viral mutation(s) within the areas targeted by this assay, and inadequate number of viral copies (<250 copies / mL). A negative result must be combined with clinical observations, patient history, and epidemiological  information.  Fact Sheet for Patients:   StrictlyIdeas.no  Fact Sheet for Healthcare Providers: BankingDealers.co.za  This test is not yet approved or  cleared by the [place] FDA and has been authorized for detection and/or diagnosis of SARS-CoV-2 by FDA under an Emergency Use Authorization (EUA).  This EUA will remain in effect (meaning this test can be used) for the duration of the COVID-19 declaration under Section 564(b)(1) of the Act, 21 U.S.C. section 360bbb-3(b)(1), unless the authorization is terminated or revoked sooner.  Performed at [facility], [address], [place], [place] [address]   Surgical PCR screen     Status: None   Collection Time: 02/14/20  1:21 AM   Specimen: Nasal Mucosa; Nasal Swab  Result Value Ref Range Status   MRSA, PCR NEGATIVE NEGATIVE Final   Staphylococcus aureus NEGATIVE NEGATIVE Final    Comment: (NOTE) The Xpert SA Assay (FDA approved for NASAL specimens in patients 62 years of age and older), is one component of a comprehensive surveillance program. It is not intended to diagnose infection nor to guide or monitor treatment. Performed at [facility], [address] [place], [place] [address]     Coagulation Studies: No results for input(s): LABPROT, INR in the last 72 hours.  Urinalysis: No results for input(s): COLORURINE, LABSPEC, PHURINE, GLUCOSEU, HGBUR, BILIRUBINUR, KETONESUR, PROTEINUR, UROBILINOGEN, NITRITE, LEUKOCYTESUR in the last 72 hours.  Invalid input(s): APPERANCEUR    Imaging: No results found.   Medications:    sodium chloride     dextrose 50 mL/hr at 02/18/20 1735    amiodarone  100 mg Oral Daily   chlorhexidine  15 mL Mouth Rinse BID   chlorhexidine gluconate (MEDLINE KIT)  15 mL Mouth Rinse BID   feeding supplement (NEPRO CARB STEADY)  237 mL Oral TID BM   levothyroxine  25 mcg Intravenous Q0600   mouth rinse  15 mL  Mouth Rinse q12n4p   multivitamin  1 tablet Oral QHS   multivitamin  15 mL Per Tube Daily   sodium chloride flush  3 mL Intravenous Q12H   sodium chloride, acetaminophen **OR** acetaminophen, albuterol, dextrose, hydrALAZINE, lidocaine-prilocaine, ondansetron (ZOFRAN) IV, polyethylene glycol, sodium chloride flush  Assessment/ Plan:  86 y.o. male with past medical history of ESRD on HD TTS, anemia of chronic kidney disease, secondary hyperparathyroidism, laryngeal cancer status post tracheostomy placement who was admitted with nausea, vomiting, dysphagia.  [facility]/TTS/LUE AVG  1.  ESRD on HD TTS.  Patient had dialysis yesterday.  No acute  indication for dialysis today.  We will plan for hemodialysis again tomorrow.  2.  Anemia of chronic kidney disease.   Epogen remains on hold at this time.  3.  Secondary hyperparathyroidism.   Serum phosphorus 3.6 and at target.  4.  Dysphagia:   Discussed with GI.  Tracheostomy maybe effecting esophagus.      LOS: 6 Quinn Bartling 9/15/20219:50 AM

## 2020-02-19 NOTE — Progress Notes (Signed)
ANTICOAGULATION CONSULT NOTE - Initial Consult  Pharmacy Consult for Heparin Infusion Dosing  Indication: atrial fibrillation and DVT  Allergies  Allergen Reactions   Ace Inhibitors Other (See Comments)    Potassium elevated   Gabapentin Other (See Comments)    Ataxia at 100 mg.   Losartan Other (See Comments)    Hyperkalemia    Patient Measurements: Height: 5\' 6"  (167.6 cm) Weight: 57.3 kg (126 lb 4.8 oz) IBW/kg (Calculated) : 63.8  Heparin dosing wt 58kg   Vital Signs: Temp: 97.6 F (36.4 C) (09/15 1059) Temp Source: Oral (09/15 1059) BP: 101/58 (09/15 1059) Pulse Rate: 68 (09/15 1059)  Labs: Recent Labs    02/17/20 0454 02/17/20 0454 02/17/20 1342 02/18/20 0028 02/18/20 0550 02/19/20 0500  HGB 12.1*   < >  --   --  12.2* 12.5*  HCT 33.4*  --   --   --  33.8* 34.3*  PLT 95*  --   --   --  95* 107*  APTT 141*   < > 61* 43* 43*  --   HEPARINUNFRC 1.80*  --   --   --  1.42*  --   CREATININE 6.12*  --   --   --  7.33* 5.03*   < > = values in this interval not displayed.    Estimated Creatinine Clearance: 8.9 mL/min (A) (by C-G formula based on SCr of 5.03 mg/dL (H)).   Medical History: Past Medical History:  Diagnosis Date   Cancer ([place])    CHF (congestive heart failure) (HCC)    COPD (chronic obstructive pulmonary disease) ([place])    Diabetes mellitus without complication ([place])    Hypertension    Renal disorder     Assessment: 86 yo male with PMH of RUE DVT and A. Fib (CHADSVASC 6). Patient taking Apixaban (Eliquis) PTA, but presents with difficulty swallowing, nausea/vomiting. Patient reports last dose of apixaban 9/8 AM, but he threw up shortly after. . Baseline aPTT, INR, and anti-Xa level elevated. Holding Eliquis d/t planned EGD. Heparin was stopped on 9/13 for procedures and possible HIT. 4Ts score indicated low probability of HIT. HIT panel still in process. Pharmacy consulted for restarting heparin infusion.  Previous heparin drip from  9/9-9/13 09/10 2300 aPTT 128 sec supratherapeutic - rate reduced to 550 units/hr 09/11 0820 aPTT 59 sec slightly subtherapeutic (heparin level still elevated from Eliquis) 09/11 1817 aPTT 62 sec slightly subtherapeutic 09/12 @ 0300 aPTT 91 sec therapeutic x 1 09/12 @ 1019 aPTT 113 sec slightly supratherapeutic (750 units/hr) 09/12 @ 1911 aPTT 61 sec slightly subtherapeutic 09/13 @ 0454 aPTT 141 seconds, HL 1.80 both supratherapeutic and non correlative.  09/13 @1342  aPTT 61 seconds, subtherapeutic  Plt 153>123>108>95>107  When heparin was first started 9/9 he did not receive a bolus and first aPTT was supratherapeutic after starting him on 950 units/hr. Pt has been off of heparin since 9/13. Hgb is stable and Plt have recovered. Due to previously supratherapeutic aPTT with no bolus on rate of 950units/hr, will not bolus today and will start at a lower rate.  Goal of Therapy:  Heparin level 0.3-0.7 units/ml once HL and aPTT correlate aPTT 66-102 seconds Monitor platelets by anticoagulation protocol: Yes   Plan:  Will not bolus due to previous supratherapeutic aPTT with initial heparin and will start heparin at 900 units/hr. Will check aPTT and HL in 8 hours and HL and CBC w/ am labs  Sherilyn Banker, PharmD Pharmacy Resident  02/19/2020 12:47 PM

## 2020-02-19 NOTE — Plan of Care (Addendum)
Report called to [place] - spoke with Maudie Mercury, RN. Transferring to room 223. Wife at bedside. All patient meds delivered to floor with chart.

## 2020-02-20 LAB — HEPARIN LEVEL (UNFRACTIONATED): Heparin Unfractionated: 1.04 IU/mL — ABNORMAL HIGH (ref 0.30–0.70)

## 2020-02-20 LAB — BASIC METABOLIC PANEL
Anion gap: 11 (ref 5–15)
BUN: 30 mg/dL — ABNORMAL HIGH (ref 8–23)
CO2: 26 mmol/L (ref 22–32)
Calcium: 8.6 mg/dL — ABNORMAL LOW (ref 8.9–10.3)
Chloride: 94 mmol/L — ABNORMAL LOW (ref 98–111)
Creatinine, Ser: 6.62 mg/dL — ABNORMAL HIGH (ref 0.61–1.24)
GFR calc Af Amer: 8 mL/min — ABNORMAL LOW (ref >60)
GFR calc non Af Amer: 7 mL/min — ABNORMAL LOW (ref >60)
Glucose, Bld: 136 mg/dL — ABNORMAL HIGH (ref 70–99)
Potassium: 5.3 mmol/L — ABNORMAL HIGH (ref 3.5–5.1)
Sodium: 131 mmol/L — ABNORMAL LOW (ref 135–145)

## 2020-02-20 LAB — CBC
HCT: 38.2 % — ABNORMAL LOW (ref 39.0–52.0)
Hemoglobin: 13.7 g/dL (ref 13.0–17.0)
MCH: 31.9 pg (ref 26.0–34.0)
MCHC: 35.9 g/dL (ref 30.0–36.0)
MCV: 88.8 fL (ref 80.0–100.0)
Platelets: 124 10*3/uL — ABNORMAL LOW (ref 150–400)
RBC: 4.3 MIL/uL (ref 4.22–5.81)
RDW: 15.9 % — ABNORMAL HIGH (ref 11.5–15.5)
WBC: 4.6 10*3/uL (ref 4.0–10.5)
nRBC: 0 % (ref 0.0–0.2)

## 2020-02-20 LAB — PROTIME-INR
INR: 1.1 (ref 0.8–1.2)
Prothrombin Time: 14.1 seconds (ref 11.4–15.2)

## 2020-02-20 LAB — GLUCOSE, CAPILLARY
Glucose-Capillary: 134 mg/dL — ABNORMAL HIGH (ref 70–99)
Glucose-Capillary: 134 mg/dL — ABNORMAL HIGH (ref 70–99)

## 2020-02-20 LAB — MAGNESIUM: Magnesium: 2 mg/dL (ref 1.7–2.4)

## 2020-02-20 LAB — APTT: aPTT: 120 seconds — ABNORMAL HIGH (ref 24–36)

## 2020-02-20 NOTE — Progress Notes (Signed)
GI note  Noted SLP assesment   I will sign off.  Please call me if any further GI concerns or questions.  We would like to thank you for the opportunity to participate in the care of [facility].    Dr [name] MD,MRCP [facility]) Gastroenterology/Hepatology Pager: [phone]

## 2020-02-20 NOTE — Progress Notes (Signed)
[facility]  ROUNDING NOTE   Subjective:  Patient resting comfortably in bed.  Due for hemodialysis treatment today.   Objective:  Vital signs in last 24 hours:  Temp:  [97.7 F (36.5 C)-98.4 F (36.9 C)] 97.7 F (36.5 C) (09/16 0507) Pulse Rate:  [67-77] 67 (09/16 0507) Resp:  [16-20] 18 (09/16 0507) BP: (102-121)/(60-80) 102/60 (09/16 0507) SpO2:  [95 %-100 %] (P) 100 % (09/16 1015)  Weight change:  Filed Weights   02/14/20 0100 02/18/20 0756 02/19/20 0540  Weight: 58 kg 58 kg 57.3 kg    Intake/Output: I/O last 3 completed shifts: In: 439.9 [P.O.:420; I.V.:19.9] Out: -    Intake/Output this shift:  No intake/output data recorded.  Physical Exam: General:  No acute distress  Head:  Normocephalic, atraumatic. Moist oral mucosal membranes  Eyes:  Anicteric  Neck:  Tracheostomy collar on   Lungs:   Clear bilateral, normal effort  Heart:  Regular, S1S2 no rubs or gallop  Abdomen:   Nontender, non distended  Extremities:  No peripheral edema.  Neurologic:  Awake, alert, attempts to converse through tracheostomy  Skin:  No acute lesions or rashes  Access:  Left upper extremity AV graft    Basic Metabolic Panel: Recent Labs  Lab 02/14/20 0103 02/14/20 2254 02/15/20 0820 02/15/20 0820 02/17/20 0454 02/17/20 0454 02/18/20 0550 02/19/20 0500 02/20/20 0717  NA   < >  --  134*  --  134*  --  131* 131* 131*  K   < >  --  3.9  --  3.3*  --  3.3* 2.8* 5.3*  CL   < >  --  96*  --  95*  --  94* 92* 94*  CO2   < >  --  23  --  26  --  25 28 26   GLUCOSE   < >  --  73  --  115*  --  149* 138* 136*  BUN   < >  --  35*  --  26*  --  31* 17 30*  CREATININE   < >  --  6.69*  --  6.12*  --  7.33* 5.03* 6.62*  CALCIUM   < >  --  8.5*   < > 8.6*   < > 8.6* 8.1* 8.6*  MG  --   --   --   --  1.9  --   --  1.8 2.0  PHOS  --  4.2  --   --   --   --   --  3.6  --    < > = values in this interval not displayed.    Liver Function Tests: No results for input(s):  AST, ALT, ALKPHOS, BILITOT, PROT, ALBUMIN in the last 168 hours. No results for input(s): LIPASE, AMYLASE in the last 168 hours. No results for input(s): AMMONIA in the last 168 hours.  CBC: Recent Labs  Lab 02/16/20 0251 02/17/20 0454 02/18/20 0550 02/19/20 0500 02/20/20 0717  WBC 4.2 3.8* 3.6* 4.1 4.6  HGB 12.5* 12.1* 12.2* 12.5* 13.7  HCT 33.9* 33.4* 33.8* 34.3* 38.2*  MCV 88.3 89.5 89.7 89.3 88.8  PLT 101* 95* 95* 107* 124*    Cardiac Enzymes: No results for input(s): CKTOTAL, CKMB, CKMBINDEX, TROPONINI in the last 168 hours.  BNP: Invalid input(s): POCBNP  CBG: Recent Labs  Lab 02/19/20 1612 02/19/20 2009 02/19/20 2343 02/20/20 0348 02/20/20 0608  GLUCAP 139* 140* 178* 134* 134*    Microbiology:  Results for orders placed or performed during the hospital encounter of 02/13/20  SARS Coronavirus 2 by RT PCR (hospital order, performed in [facility] hospital lab) Nasopharyngeal Nasopharyngeal Swab     Status: None   Collection Time: 02/13/20  7:53 AM   Specimen: Nasopharyngeal Swab  Result Value Ref Range Status   SARS Coronavirus 2 NEGATIVE NEGATIVE Final    Comment: (NOTE) SARS-CoV-2 target nucleic acids are NOT DETECTED.  The SARS-CoV-2 RNA is generally detectable in upper and lower respiratory specimens during the acute phase of infection. The lowest concentration of SARS-CoV-2 viral copies this assay can detect is 250 copies / mL. A negative result does not preclude SARS-CoV-2 infection and should not be used as the sole basis for treatment or other patient management decisions.  A negative result may occur with improper specimen collection / handling, submission of specimen other than nasopharyngeal swab, presence of viral mutation(s) within the areas targeted by this assay, and inadequate number of viral copies (<250 copies / mL). A negative result must be combined with clinical observations, patient history, and epidemiological information.  Fact  Sheet for Patients:   StrictlyIdeas.no  Fact Sheet for Healthcare Providers: BankingDealers.co.za  This test is not yet approved or  cleared by the [place] FDA and has been authorized for detection and/or diagnosis of SARS-CoV-2 by FDA under an Emergency Use Authorization (EUA).  This EUA will remain in effect (meaning this test can be used) for the duration of the COVID-19 declaration under Section 564(b)(1) of the Act, 21 U.S.C. section 360bbb-3(b)(1), unless the authorization is terminated or revoked sooner.  Performed at [facility], [address], [place], [place] [address]   Surgical PCR screen     Status: None   Collection Time: 02/14/20  1:21 AM   Specimen: Nasal Mucosa; Nasal Swab  Result Value Ref Range Status   MRSA, PCR NEGATIVE NEGATIVE Final   Staphylococcus aureus NEGATIVE NEGATIVE Final    Comment: (NOTE) The Xpert SA Assay (FDA approved for NASAL specimens in patients 26 years of age and older), is one component of a comprehensive surveillance program. It is not intended to diagnose infection nor to guide or monitor treatment. Performed at [facility], [address] [place], [place] [address]     Coagulation Studies: Recent Labs    02/20/20 0717  LABPROT 14.1  INR 1.1    Urinalysis: No results for input(s): COLORURINE, LABSPEC, PHURINE, GLUCOSEU, HGBUR, BILIRUBINUR, KETONESUR, PROTEINUR, UROBILINOGEN, NITRITE, LEUKOCYTESUR in the last 72 hours.  Invalid input(s): APPERANCEUR    Imaging: No results found.   Medications:   . sodium chloride    . heparin 700 Units/hr (02/20/20 0025)   . amiodarone  100 mg Oral Daily  . chlorhexidine  15 mL Mouth Rinse BID  . chlorhexidine gluconate (MEDLINE KIT)  15 mL Mouth Rinse BID  . feeding supplement (NEPRO CARB STEADY)  237 mL Oral TID BM  . levothyroxine  25 mcg Intravenous Q0600  . mouth rinse  15 mL Mouth Rinse q12n4p  .  multivitamin  1 tablet Oral QHS  . multivitamin  15 mL Oral Daily  . mupirocin ointment  1 application Topical BID  . sodium chloride flush  3 mL Intravenous Q12H   sodium chloride, acetaminophen **OR** acetaminophen, albuterol, dextrose, hydrALAZINE, lidocaine-prilocaine, ondansetron (ZOFRAN) IV, polyethylene glycol, sodium chloride flush  Assessment/ Plan:  86 y.o. male with past medical history of ESRD on HD TTS, anemia of chronic kidney disease, secondary hyperparathyroidism, laryngeal  cancer status post tracheostomy placement who was admitted with nausea, vomiting, dysphagia.  [facility]/TTS/LUE AVG  1.  ESRD on HD TTS.  Patient due for dialysis treatment today.  Ultrafiltration target 1.5 kg.  Thereafter he will resume normal outpatient dialysis treatment schedule.  2.  Anemia of chronic kidney disease.   Reevaluate use of erythropoietin stimulating agents as an outpatient.  3.  Secondary hyperparathyroidism.   Phosphorus remains under good control at 3.6.  Continue to monitor as an outpatient.  4.  Dysphagia:   Discussed with GI.  Tracheostomy maybe effecting esophagus.      LOS: 7 Kerry [name] 9/16/202111:00 AM

## 2020-02-20 NOTE — Discharge Summary (Signed)
Kerry [name] UXL:[id number] DOB: 28-Apr-1934 DOA: 02/13/2020  PCP: Cecile Sheerer, MD  Admit date: 02/13/2020 Discharge date: 02/20/2020  Time spent: 35 minutes  Recommendations for Outpatient Follow-up:  1. Close follow-up with outpatient ENT provider ([facility]) 2. PCP f/u 1-2 weeks   Discharge Diagnoses:  Principal Problem:   Difficulty swallowing Active Problems:   Anemia of chronic disease   Coronary artery disease   Hyperlipidemia   Hypertension   Squamous cell cancer of hypopharynx (HCC)   Acute deep vein thrombosis (DVT) of right upper extremity (HCC)   AF (paroxysmal atrial fibrillation) (HCC)   Chronic systolic CHF (congestive heart failure) (HCC)   ESRD (end stage renal disease) (HCC)   Hypoglycemia   COPD (chronic obstructive pulmonary disease) ([place])   Type II diabetes mellitus with renal manifestations ([place])   Protein-calorie malnutrition, severe   Discharge Condition: fair  Diet recommendation: low sodium  Filed Weights   02/14/20 0100 02/18/20 0756 02/19/20 0540  Weight: 58 kg 58 kg 57.3 kg    History of present illness:  Kerry [name] a 86 y.o.malewith medical history significant oflaryngeal carcinoma, s/p of tracheostomy withstoma on trach collar, ESRD (TTS), last dialysis on Tuesday, HTN, HLD, DM, COPD,on 2-3L ofoxygen at baseline,sCHFwith EF 20-30%, RUE DVT, A fib on Eliquis, CAD, presents with nausea vomiting difficultyswallowing x 2 days with inability to tolerate anything by mouth including water.  Vitals and labs in the ED were essentially unremarkable.  Labs consistent with ESRD, mild thrombocytopenia.    CT neck soft tissue showed "prominent debris within the oropharynx and upper esophagus which may reflect a food bolus. There is associated mild dilation of the upper esophagus. Findings raise the possibility of an upper esophageal stricture with obstruction.  Admitted for observation with GI consulted.  Barium esophagram on  9/10 confirmed severe focal esophageal narrowing at the level of the tracheostomy tube restricting passage of contrast most consistent with a high-grade stricture.   EGD by Dr. Vicente Males on 9/14 showed no stricture.  The end of the "tracheostomy tube" seen in the proximal esophagus (this is TEP plate, patient no longer has trach tube, only stoma).    Hospital Course:  Difficulty swallowing due to esophageal stricture / narrowing -  EGD under fluoroscopy on 9/14 with no stricture seen, but end of trach tube (TEP plate) seen in esophagus.  ENT and GI discussed case.  ENT felt outpatient follow up at [facility] appropriate. Tolerated diet well during hospital stay, no further recurrence of vomiting.  Hypoglycemia - present on admission, resolved.   - advised holding home lantus pending pcp f/u  Thrombocytopenia - mild, present on admission with platelets 123k>>108k>>101k>>95k>>95k (9/14).   Stopped heparin 9/13, check HIT antibody > negative Platelets stable today 107, Will need outpatient f/u  Left great toe pain due ingrown nail - consulted podiatry.  Ingrown nail debrided on 9/10.  Daily mupirocin ointment and light bandage.  Daily warm soaks. Outpatient podiatry f/u  Coronary artery disease - stable, no chest pain.    Hx of Hypertension- BP soft on admission, likely due to no PO intake and hypovolemia. Is also on midodrine at home. -resumed home midodrine  Squamous cell cancer of hypopharynx - s/p oftracheostomy withstoma on trach collar, has tracheoesophageal puncture in place.  Follow-up with ENT at [facility].  Acute DVT of right upper extremity - stable. heparin as above, resuming home noac at d/c  Paroxysmal A-fib - rate controlled.  heparin as above  Chronic systolic CHF - appears euvolemic, asymptomatic  and currently well-compensated.   Echo on 11/30/2019 showed EF of 20-30%. Volume management by dialysis.  ESRD - on T/Th/S hemodialysis.  Nephrology consulted for  dialysis.received day of discharge  COPD - albuterol nebs PRN  Procedures:  Egd, barium swallow  Consultations:  GI, nephrology, cardiology  Discharge Exam: Vitals:   02/20/20 1345 02/20/20 1400  BP: (!) 120/49 111/89  Pulse: 82 77  Resp: (!) 30 (!) 25  Temp:  98.4 F (36.9 C)  SpO2:  98%    General: nad, pleasant Cardiovascular: rrr, soft systolic murmur Respiratory: ctab Abdomen: soft, non-tender Extremities: no edema  Discharge Instructions   Discharge Instructions    Call MD for:  difficulty breathing, headache or visual disturbances   Complete by: As directed    Call MD for:  extreme fatigue   Complete by: As directed    Call MD for:  persistant nausea and vomiting   Complete by: As directed    Call MD for:  redness, tenderness, or signs of infection (pain, swelling, redness, odor or green/yellow discharge around incision site)   Complete by: As directed    Call MD for:  temperature >100.4   Complete by: As directed    Diet - low sodium heart healthy   Complete by: As directed    Discharge instructions   Complete by: As directed    Please follow-up with your [facility] specialist   Increase activity slowly   Complete by: As directed    No wound care   Complete by: As directed      Allergies as of 02/20/2020      Reactions   Ace Inhibitors Other (See Comments)   Potassium elevated   Gabapentin Other (See Comments)   Ataxia at 100 mg.   Losartan Other (See Comments)   Hyperkalemia      Medication List    STOP taking these medications   Lantus SoloStar 100 UNIT/ML Solostar Pen Generic drug: insulin glargine     TAKE these medications   amiodarone 100 MG tablet Commonly known as: PACERONE Take 1 tablet (100 mg total) by mouth daily.   apixaban 5 MG Tabs tablet Commonly known as: ELIQUIS Two tablets twice a day through 12/17/2019 then one tablet twice aday afterwards   atorvastatin 80 MG tablet Commonly known as: LIPITOR Take  80 mg by mouth daily.   chlorhexidine gluconate (MEDLINE KIT) 0.12 % solution Commonly known as: PERIDEX 15 mLs by Mouth Rinse route 2 (two) times daily.   epoetin alfa 2000 UNIT/ML injection Commonly known as: EPOGEN Inject 1 mL (2,000 Units total) into the vein Every Tuesday,Thursday,and Saturday with dialysis.   Euthyrox 50 MCG tablet Generic drug: levothyroxine Take 50 mcg by mouth daily.   ferrous sulfate 325 (65 FE) MG EC tablet Take 325 mg by mouth daily with breakfast.   glipiZIDE 5 MG tablet Commonly known as: GLUCOTROL Take 5 mg by mouth every morning.   Glucagon Emergency 1 MG Kit as needed. Reported on 06/02/2015   lidocaine-prilocaine cream Commonly known as: EMLA Apply 1 application topically as needed (topical anesthesia for hemodialysis if Gebauers and Lidocaine injection are ineffective.).   melatonin 5 MG Tabs Take 0.5 tablets (2.5 mg total) by mouth at bedtime.   midodrine 10 MG tablet Commonly known as: PROAMATINE Take 10 mg by mouth 3 (three) times daily.   polyethylene glycol 17 g packet Commonly known as: MIRALAX / GLYCOLAX Take 17 g by mouth daily as needed for moderate  constipation.   Precision QID Test test strip Generic drug: glucose blood Accu check Aviva strips, use twice daily   sevelamer carbonate 800 MG tablet Commonly known as: RENVELA Take 800-1,600 mg by mouth in the morning, at noon, in the evening, and at bedtime.   Triphrocaps 1 MG Caps Take 1 capsule by mouth daily.      Allergies  Allergen Reactions  . Ace Inhibitors Other (See Comments)    Potassium elevated  . Gabapentin Other (See Comments)    Ataxia at 100 mg.  . Losartan Other (See Comments)    Hyperkalemia      The results of significant diagnostics from this hospitalization (including imaging, microbiology, ancillary and laboratory) are listed below for reference.    Significant Diagnostic Studies: CT ABDOMEN PELVIS WO CONTRAST  Result Date:  02/13/2020 CLINICAL DATA:  Vomiting, hypoglycemia, abdominal pain, question bowel obstruction, dialysis patient EXAM: CT ABDOMEN AND PELVIS WITHOUT CONTRAST TECHNIQUE: Multidetector CT imaging of the abdomen and pelvis was performed following the standard protocol without IV contrast. Sagittal and coronal MPR images reconstructed from axial data set. Oral contrast was not administered. COMPARISON:  12/06/2019 FINDINGS: Lower chest: Small to moderate RIGHT pleural effusion, slightly increased. Resolution of previously seen LEFT pleural effusion. Compressive atelectasis RIGHT lower lobe. Enlargement of cardiac chambers. Hepatobiliary: High attenuation material within gallbladder increased from previous exam, could represent layered calculi, milk of calcium or vicarious excretion of contrast if recently administered. No definite focal hepatic abnormality. Pancreas: Normal appearance Spleen: Normal appearance Adrenals/Urinary Tract: Thickening of adrenal glands without discrete mass. Atrophic kidneys consistent with history of dialysis. Questionable tiny hyperdense cyst 4 mm diameter at mid RIGHT kidney. No additional renal mass or hydronephrosis. Ureters and bladder decompressed. Stomach/Bowel: Mild rectal wall thickening. Appendix not visualized. Gaseous distention of sigmoid loop extending cranially to the LEFT lobe of the liver, 6.8 cm transverse. Remainder of colon decompressed. Stomach decompressed. No bowel dilatation or evidence of obstruction. Mild sigmoid diverticulosis incidentally noted without evidence of diverticulitis. Vascular/Lymphatic: Extensive atherosclerotic calcifications aorta, iliac arteries, and coronary arteries. Significant calcified plaque at the origins of the celiac, superior mesenteric, and BILATERAL renal arteries. Aorta normal caliber. No adenopathy. Reproductive: Mild prostatic enlargement. Other: Small amount of free fluid in pelvis and LEFT mid abdomen. No free air. No hernia.  Musculoskeletal: Osseous demineralization. Multilevel degenerative disc disease changes thoracolumbar spine. Chronic superior endplate concavity L1 unchanged. Degenerative changes of both hip joints. IMPRESSION: Gaseous distention of sigmoid loop extending cranially to the LEFT lobe of the liver, 6.8 cm transverse without wall thickening or obstruction. Mild rectal wall thickening question proctitis; recommend correlation with proctoscopy. Small amount of nonspecific free fluid in pelvis and LEFT mid abdomen. Small to moderate RIGHT pleural effusion, slightly increased. High attenuation material within gallbladder increased from previous exam, could represent layered calculi, milk of calcium or vicarious excretion of contrast if recently administered. Mild prostatic enlargement. Aortic Atherosclerosis (ICD10-I70.0). Electronically Signed   By: Lavonia Dana M.D.   On: 02/13/2020 08:49   DG Chest 2 View  Result Date: 02/13/2020 CLINICAL DATA:  Vomiting.  Difficulty swallowing. EXAM: CHEST - 2 VIEW COMPARISON:  CT 12/06/2019.  Chest x-ray 12/02/2019. FINDINGS: Interim removal of tracheostomy tube. Stable cardiomegaly. Bibasilar atelectasis/infiltrates again noted. Small right pleural effusion again noted. No left pleural effusion noted on today's exam. No pneumothorax. Surgical clips right neck. IMPRESSION: 1.  Interim removal of tracheostomy tube. 2.  Stable cardiomegaly. 3. Bibasilar atelectasis/infiltrates again noted. Small right pleural effusion again noted.  No left pleural effusion noted on today's exam. Electronically Signed   By: Marcello Moores  Register   On: 02/13/2020 08:19   CT Head Wo Contrast  Result Date: 02/13/2020 CLINICAL DATA:  Dysphagia EXAM: CT HEAD WITHOUT CONTRAST TECHNIQUE: Contiguous axial images were obtained from the base of the skull through the vertex without intravenous contrast. COMPARISON:  November 28, 2019 FINDINGS: Brain: There is stable mild diffuse atrophy. There is no intracranial mass,  hemorrhage, extra-axial fluid collection, or midline shift. Slight small vessel disease in the centra semiovale bilaterally is stable. No acute infarct is demonstrable on this study. Vascular: No hyperdense vessel. There are foci of calcification in the carotid siphon regions bilaterally. Skull: The bony calvarium appears intact. Sinuses/Orbits: Visualized paranasal sinuses are clear. Visualized orbits appear symmetric bilaterally. Other: Mastoid air cells are clear. IMPRESSION: Atrophy with slight periventricular small vessel disease. No acute infarct. No mass or hemorrhage. There are foci of arterial vascular calcification. Electronically Signed   By: Lowella Grip III M.D.   On: 02/13/2020 08:46   CT Soft Tissue Neck Wo Contrast  Result Date: 02/13/2020 CLINICAL DATA:  Lymphadenopathy, neck. History of throat cancer, difficulty swallowing. Abdominal and throat pain. EXAM: CT NECK WITHOUT CONTRAST TECHNIQUE: Multidetector CT imaging of the neck was performed following the standard protocol without intravenous contrast. COMPARISON:  No pertinent prior exams are available for comparison. FINDINGS: Pharynx and larynx: Prior total laryngectomy. Please note there is limited assessment for residual/recurrent malignancy on this non-contrast examination. Within this limitation, no definite discrete mass is identified within the oral cavity or pharynx. There is prominent debris within the oropharynx and upper esophagus which may reflect a food bolus. There is associated mild dilation of the upper esophagus. The esophagus becomes decompressed at the C7 level. Salivary glands: There are a few nonspecific calcifications within the parotid glands bilaterally. The submandibular glands are unremarkable. Thyroid: The right thyroid lobe is poorly delineated and may be surgically absent. There are subcentimeter nodules within the left thyroid lobe not meeting consensus criteria for ultrasound follow-up. Lymph nodes: Lack of  intravenous contrast administration limits evaluation for cervical lymphadenopathy. Within this limitation, no pathologically enlarged or suspicious cervical chain lymph nodes are identified. Vascular: There is limited evaluation of the major vascular structures of the neck on this noncontrast examination. Atherosclerotic calcifications within the visualized aortic arch, proximal major branch vessels of the neck and carotid arteries. Limited intracranial: No acute intracranial abnormality is identified. Visualized orbits: Visualized orbits show no acute finding. Mastoids and visualized paranasal sinuses: Mild left sphenoid sinus mucosal thickening. No significant mastoid effusion. Skeleton: Advanced cervical spondylosis with multilevel disc space narrowing, posterior disc osteophytes, uncovertebral and facet hypertrophy. There is fusion across the C4-C5 disc space. Also of note, there is advanced C5-C6 disc degeneration with degenerative endplate irregularity and degenerative endplate sclerosis. Prominent multilevel ventral osteophytes, most notably at C5-C6 and C6-C7. Upper chest: Partially visualized right pleural effusion, likely moderate in size. There are multifocal ground-glass opacities within the imaged lung apices measuring up to 8 mm. Other: TEP device noted. IMPRESSION: Prior total laryngectomy. Within the limitations of a non-contrast examination, no definite residual/recurrent soft tissue neck mass is identified. No pathologically enlarged cervical chain lymph nodes are identified. There is prominent debris within the oropharynx and upper esophagus which may reflect a food bolus. There is associated mild dilation of the upper esophagus. Findings raise the possibility of an upper esophageal stricture with obstruction. An esophagram may be helpful for further evaluation. Prominent ventral osteophytes  most notably at the C5-C6 and C6-C7 levels, which may exert some mass effect upon the upper esophagus.  Multifocal ground-glass opacities within the imaged lung apices measuring up to 8 mm. Findings may be infectious/inflammatory in etiology. However, follow-up is recommended to exclude alternative etiologies (i.e. neoplasm). Non-contrast chest CT at 3-6 months is recommended. If nodules persist, subsequent management will be based upon the most suspicious nodule(s). This recommendation follows the consensus statement: Guidelines for Management of Incidental Pulmonary Nodules Detected on CT Images: From the Fleischner Society 2017; Radiology 2017; 284:228-243. Partially imaged right pleural effusion, likely moderate in size. Electronically Signed   By: Kellie Simmering DO   On: 02/13/2020 09:07   DG ESOPHAGUS W SINGLE CM (SOL OR THIN BA)  Result Date: 02/14/2020 CLINICAL DATA:  Dysphagia EXAM: ESOPHAGUS/BARIUM SWALLOW/TABLET STUDY TECHNIQUE: Initial scout AP supine abdominal image obtained to insure adequate colon cleansing. Barium was introduced into the colon in a retrograde fashion and refluxed from the rectum to the cecum. Spot images of the colon followed by overhead radiographs were obtained. FLUOROSCOPY TIME:  Fluoroscopy Time:  1.4 minute Radiation Exposure Index (if provided by the fluoroscopic device): 10.5 mGy Number of Acquired Spot Images: 0 COMPARISON:  CT neck 02/13/2020 FINDINGS: Normal pharyngeal anatomy and motility. Severe focal esophageal narrowing at the level of the tracheostomy tube restricting passage of contrast and making it difficult for the patient to swallow the oral bolus. There is no complete obstruction. Tertiary contractions of the distal half of the esophagus as can be seen with mild spasm. No definite hiatal hernia was demonstrated. IMPRESSION: Severe focal esophageal narrowing at the level of the tracheostomy tube restricting passage of contrast most consistent with a high-grade stricture, making it difficult for the patient to swallow the oral bolus. No complete obstruction.  Tertiary contractions of the distal half of the esophagus as can be seen with mild spasm. Electronically Signed   By: Kathreen Devoid   On: 02/14/2020 09:49    Microbiology: Recent Results (from the past 240 hour(s))  SARS Coronavirus 2 by RT PCR (hospital order, performed in [facility] hospital lab) Nasopharyngeal Nasopharyngeal Swab     Status: None   Collection Time: 02/13/20  7:53 AM   Specimen: Nasopharyngeal Swab  Result Value Ref Range Status   SARS Coronavirus 2 NEGATIVE NEGATIVE Final    Comment: (NOTE) SARS-CoV-2 target nucleic acids are NOT DETECTED.  The SARS-CoV-2 RNA is generally detectable in upper and lower respiratory specimens during the acute phase of infection. The lowest concentration of SARS-CoV-2 viral copies this assay can detect is 250 copies / mL. A negative result does not preclude SARS-CoV-2 infection and should not be used as the sole basis for treatment or other patient management decisions.  A negative result may occur with improper specimen collection / handling, submission of specimen other than nasopharyngeal swab, presence of viral mutation(s) within the areas targeted by this assay, and inadequate number of viral copies (<250 copies / mL). A negative result must be combined with clinical observations, patient history, and epidemiological information.  Fact Sheet for Patients:   StrictlyIdeas.no  Fact Sheet for Healthcare Providers: BankingDealers.co.za  This test is not yet approved or  cleared by the [place] FDA and has been authorized for detection and/or diagnosis of SARS-CoV-2 by FDA under an Emergency Use Authorization (EUA).  This EUA will remain in effect (meaning this test can be used) for the duration of the COVID-19 declaration under Section 564(b)(1) of the Act, 21  U.S.C. section 360bbb-3(b)(1), unless the authorization is terminated or revoked sooner.  Performed at [facility], [address] [place], [place] [address]   Surgical PCR screen     Status: None   Collection Time: 02/14/20  1:21 AM   Specimen: Nasal Mucosa; Nasal Swab  Result Value Ref Range Status   MRSA, PCR NEGATIVE NEGATIVE Final   Staphylococcus aureus NEGATIVE NEGATIVE Final    Comment: (NOTE) The Xpert SA Assay (FDA approved for NASAL specimens in patients 49 years of age and older), is one component of a comprehensive surveillance program. It is not intended to diagnose infection nor to guide or monitor treatment. Performed at [facility], [address] [place], [place] [address]      Labs: Basic Metabolic Panel: Recent Labs  Lab 02/14/20 0103 02/14/20 2254 02/15/20 0820 02/17/20 0454 02/18/20 0550 02/19/20 0500 02/20/20 0717  NA   < >  --  134* 134* 131* 131* 131*  K   < >  --  3.9 3.3* 3.3* 2.8* 5.3*  CL   < >  --  96* 95* 94* 92* 94*  CO2   < >  --  23 26 25 28 26   GLUCOSE   < >  --  73 115* 149* 138* 136*  BUN   < >  --  35* 26* 31* 17 30*  CREATININE   < >  --  6.69* 6.12* 7.33* 5.03* 6.62*  CALCIUM   < >  --  8.5* 8.6* 8.6* 8.1* 8.6*  MG  --   --   --  1.9  --  1.8 2.0  PHOS  --  4.2  --   --   --  3.6  --    < > = values in this interval not displayed.   Liver Function Tests: No results for input(s): AST, ALT, ALKPHOS, BILITOT, PROT, ALBUMIN in the last 168 hours. No results for input(s): LIPASE, AMYLASE in the last 168 hours. No results for input(s): AMMONIA in the last 168 hours. CBC: Recent Labs  Lab 02/16/20 0251 02/17/20 0454 02/18/20 0550 02/19/20 0500 02/20/20 0717  WBC 4.2 3.8* 3.6* 4.1 4.6  HGB 12.5* 12.1* 12.2* 12.5* 13.7  HCT 33.9* 33.4* 33.8* 34.3* 38.2*  MCV 88.3 89.5 89.7 89.3 88.8  PLT 101* 95* 95* 107* 124*   Cardiac Enzymes: No results for input(s): CKTOTAL, CKMB, CKMBINDEX, TROPONINI in the last 168 hours. BNP: BNP (last 3 results) Recent Labs    11/28/19 1331  BNP 4,339.6*    ProBNP (last 3  results) No results for input(s): PROBNP in the last 8760 hours.  CBG: Recent Labs  Lab 02/19/20 1612 02/19/20 2009 02/19/20 2343 02/20/20 0348 02/20/20 0608  GLUCAP 139* 140* 178* 134* 134*       Signed:  Desma Maxim MD.  [facility] 02/20/2020, 3:15 PM

## 2020-02-20 NOTE — Progress Notes (Signed)
suctionned pt's stoma without difficulty. Pt. Tolerated well.

## 2020-02-20 NOTE — Progress Notes (Signed)
SLP Cancellation Note  Patient Details Name: Kerry [name] MRN: [id number] DOB: Mar 21, 1934   Cancelled treatment:       Reason Eval/Treat Not Completed:  (chart reviewed; consulted Pt, Wife post diet upgrades). Met w/ both pt and Wife yesterday PM then pt again today while at HD re: his toleration of the oral diet as the consistency has upgraded each day. He is now on a Dysphagia level 2 (minced foods) w/ thin liquids. Extra gravies, soups have been suggested to help moisten foods. Pt communicated ease of swallowing and good toleration of the diet now -- he endorsed he only wanted to eat/drink a little at one time vs eating full meals ("too much"). He denied any significant discomfort swallowing today.  Recommend continue this diet w/ frequent, mini meals/snacks during the day -- he endorsed he is suppose to do such at home per MD. Also recommended f/u by Dietician for nutritional support, and to f/u w/ his Team at [facility] for ongoing monitoring of his status re: the TEP device, its placement and comfort for pt, potential direct viewing of the Esophagus/posterior anchor side of the TEP device to determine if any relation to the "full" feelings/discomfort he felt in that area when swallowing, and further discussion w/ pt and Wife re: potential risk for aspiration through the TEP device as this was noted during the evaluation. Recommend discussion re: negative sequelae of aspiration including pulmonary decline; options. Pt agreed. NSG to reconsult if any new needs arise during admit.      [name], MS, CCC-SLP Speech Language Pathologist Rehab Services [phone] [facility] 02/20/2020, 2:17 PM

## 2020-02-20 NOTE — Progress Notes (Signed)
ANTICOAGULATION CONSULT NOTE   Pharmacy Consult for Heparin Infusion Dosing  Indication: atrial fibrillation and DVT  Height: 5\' 6"  (167.6 cm) Weight: 57.3 kg (126 lb 4.8 oz) IBW/kg (Calculated) : 63.8  Heparin dosing wt 58kg  Vital Signs: Temp: 97.7 F (36.5 C) (09/16 0507) Temp Source: Oral (09/16 0507) BP: 102/60 (09/16 0507) Pulse Rate: 67 (09/16 0507)  Labs: Recent Labs    02/18/20 0028 02/18/20 0550 02/19/20 0500 02/19/20 2128  HGB  --  12.2* 12.5*  --   HCT  --  33.8* 34.3*  --   PLT  --  95* 107*  --   APTT 43* 43*  --  >160*  HEPARINUNFRC  --  1.42*  --  1.49*  CREATININE  --  7.33* 5.03*  --    Estimated Creatinine Clearance: 8.9 mL/min (A) (by C-G formula based on SCr of 5.03 mg/dL (H)).  Medical History: Past Medical History:  Diagnosis Date  . Cancer ([place])   . CHF (congestive heart failure) ([place])   . COPD (chronic obstructive pulmonary disease) ([place])   . Diabetes mellitus without complication ([place])   . Hypertension   . Renal disorder    Assessment: 86 yo male with PMH of RUE DVT and A. Fib (CHADSVASC 6). Patient taking Apixaban (Eliquis) PTA, but presents with difficulty swallowing, nausea/vomiting. Patient reports last dose of apixaban 9/8 AM, but he threw up shortly after. . Baseline aPTT, INR, and anti-Xa level elevated. Holding Eliquis d/t planned EGD. Heparin was stopped on 9/13 for procedures and possible HIT. 4Ts score indicated low probability of HIT. HIT panel still in process. Pharmacy consulted for restarting heparin infusion.    Goal of Therapy:  Heparin level 0.3-0.7 units/ml once HL and aPTT correlate aPTT 66-102 seconds Monitor platelets by anticoagulation protocol: Yes   Plan:   aPTT (120s) and heparin level (1.04) both elevated but not yet correlating  Continue to use aPTT to guide infusion rate until both level correlate  Decrease infusion slightly to 650 units/hr  recheck aPTT 8 hours after rate change and follow-up heparin  level once daily to check correlation   Next CBC w/ am labs 9/17  Dallie Piles, PharmD 02/20/2020 6:59 AM

## 2020-02-20 NOTE — Progress Notes (Signed)
Pt stable for HD avg +/+ vitals stable

## 2020-02-20 NOTE — Progress Notes (Signed)
Pt stable for d/c tolerated HD well vitals stable ufg achieved avg +/+.

## 2020-02-20 NOTE — Progress Notes (Signed)
Kerry [name] to be D/C'd home with wife per MD order.  Discussed prescriptions and follow up appointments with the patient. Prescriptions given to patient, medication list explained in detail. Pt verbalized understanding.  Allergies as of 02/20/2020       Reactions   Ace Inhibitors Other (See Comments)   Potassium elevated   Gabapentin Other (See Comments)   Ataxia at 100 mg.   Losartan Other (See Comments)   Hyperkalemia        Medication List     STOP taking these medications    Lantus SoloStar 100 UNIT/ML Solostar Pen Generic drug: insulin glargine       TAKE these medications    amiodarone 100 MG tablet Commonly known as: PACERONE Take 1 tablet (100 mg total) by mouth daily.   apixaban 5 MG Tabs tablet Commonly known as: ELIQUIS Two tablets twice a day through 12/17/2019 then one tablet twice aday afterwards   atorvastatin 80 MG tablet Commonly known as: LIPITOR Take 80 mg by mouth daily.   chlorhexidine gluconate (MEDLINE KIT) 0.12 % solution Commonly known as: PERIDEX 15 mLs by Mouth Rinse route 2 (two) times daily.   epoetin alfa 2000 UNIT/ML injection Commonly known as: EPOGEN Inject 1 mL (2,000 Units total) into the vein Every Tuesday,Thursday,and Saturday with dialysis.   Euthyrox 50 MCG tablet Generic drug: levothyroxine Take 50 mcg by mouth daily.   ferrous sulfate 325 (65 FE) MG EC tablet Take 325 mg by mouth daily with breakfast.   glipiZIDE 5 MG tablet Commonly known as: GLUCOTROL Take 5 mg by mouth every morning.   Glucagon Emergency 1 MG Kit as needed. Reported on 06/02/2015   lidocaine-prilocaine cream Commonly known as: EMLA Apply 1 application topically as needed (topical anesthesia for hemodialysis if Gebauers and Lidocaine injection are ineffective.).   melatonin 5 MG Tabs Take 0.5 tablets (2.5 mg total) by mouth at bedtime.   midodrine 10 MG tablet Commonly known as: PROAMATINE Take 10 mg by mouth 3 (three) times daily.    polyethylene glycol 17 g packet Commonly known as: MIRALAX / GLYCOLAX Take 17 g by mouth daily as needed for moderate constipation.   Precision QID Test test strip Generic drug: glucose blood Accu check Aviva strips, use twice daily   sevelamer carbonate 800 MG tablet Commonly known as: RENVELA Take 800-1,600 mg by mouth in the morning, at noon, in the evening, and at bedtime.   Triphrocaps 1 MG Caps Take 1 capsule by mouth daily.        Vitals:   02/20/20 1345 02/20/20 1400  BP: (!) 120/49 111/89  Pulse: 82 77  Resp: (!) 30 (!) 25  Temp:  98.4 F (36.9 C)  SpO2:  98%    Skin clean, dry and intact without evidence of skin break down, no evidence of skin tears noted. IV catheter discontinued intact. Site without signs and symptoms of complications. Dressing and pressure applied. Pt denies pain at this time. No complaints noted.  An After Visit Summary was printed and given to the patient. Patient escorted via [place], and D/C home via private auto with home tank of oxygen.  [place] A Kerry [name]

## 2020-02-20 NOTE — Care Management Important Message (Signed)
Important Message  Patient Details  Name: Kerry [name] MRN: [id number] Date of Birth: 1933/11/18   Medicare Important Message Given:  Yes     Dannette Barbara 02/20/2020, 1:09 PM

## 2020-02-20 NOTE — Evaluation (Signed)
Physical Therapy Evaluation Patient Details Name: Kerry [name] MRN: [id number] DOB: 1934-05-19 Today's Date: 02/20/2020   History of Present Illness  Pt is an 86 y.o. 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 Pt currently on 2-3L of O2 at baseline through trach collar. Also complains of L great toe pain. Pt evaluated this date prior to HD session  Clinical Impression  Pt is a pleasant 86 year old male who was admitted for difficulty swallowing. Pt performs transfers and ambulation with cga and HHA. Pt demonstrates deficits with endurance/mobility. All mobility performed on 3L of O2 with sats maintaining WNL. Pt appears close to baseline level. Would benefit from skilled PT to address above deficits and promote optimal return to PLOF. Recommend transition to [place] upon discharge from acute hospitalization.     Follow Up Recommendations Home health PT (however pt refusing at this time)    Equipment Recommendations  None recommended by PT    Recommendations for Other Services       Precautions / Restrictions Precautions Precautions: Fall Restrictions Weight Bearing Restrictions: No      Mobility  Bed Mobility               General bed mobility comments: not performed as received seated on EOB  Transfers Overall transfer level: Needs assistance Equipment used: 1 person hand held assist Transfers: Sit to/from Stand Sit to Stand: Min guard         General transfer comment: safe technique with upright posture  Ambulation/Gait Ambulation/Gait assistance: Min guard Gait Distance (Feet): 2 Feet Assistive device: 1 person hand held assist Gait Pattern/deviations: Step-to pattern     General Gait Details: ambulated to recliner with safe technique. Requested further  ambulation, however pt deferred and transport came to take pt to HD. Further ambulation deferred at this time  Stairs            Wheelchair Mobility    Modified Rankin (Stroke Patients Only)       Balance Overall balance assessment: Needs assistance Sitting-balance support: Feet supported Sitting balance-Leahy Scale: Good     Standing balance support: Single extremity supported Standing balance-Leahy Scale: Fair                               Pertinent Vitals/Pain Pain Assessment: No/denies pain    Home Living Family/patient expects to be discharged to:: Private residence Living Arrangements: Spouse/significant other Available Help at Discharge: Family Type of Home: House Home Access: Stairs to enter Entrance Stairs-Rails: Psychiatric nurse of Steps: 2 Home Layout: Two level;Able to live on main level with bedroom/bathroom Home Equipment: None      Prior Function Level of Independence: Independent         Comments: reports no recent falls     Hand Dominance        Extremity/Trunk Assessment   Upper Extremity Assessment Upper Extremity Assessment: Overall WFL for tasks assessed    Lower Extremity Assessment Lower Extremity Assessment: Overall WFL for tasks assessed       Communication   Communication: Tracheostomy  Cognition Arousal/Alertness: Awake/alert Behavior During Therapy: WFL for tasks assessed/performed Overall Cognitive Status: Within Functional Limits for tasks assessed  General Comments      Exercises     Assessment/Plan    PT Assessment Patient needs continued PT services  PT Problem List Decreased strength;Decreased activity tolerance;Decreased balance;Decreased mobility       PT Treatment Interventions Gait training;Therapeutic exercise;Therapeutic activities    PT Goals (Current goals can be found in the Care Plan section)  Acute  Rehab PT Goals Patient Stated Goal: to go home PT Goal Formulation: With patient Time For Goal Achievement: 03/05/20 Potential to Achieve Goals: Good    Frequency Min 2X/week   Barriers to discharge        Co-evaluation               AM-PAC PT "6 Clicks" Mobility  Outcome Measure Help needed turning from your back to your side while in a flat bed without using bedrails?: None Help needed moving from lying on your back to sitting on the side of a flat bed without using bedrails?: None Help needed moving to and from a bed to a chair (including a wheelchair)?: A Little Help needed standing up from a chair using your arms (e.g., wheelchair or bedside chair)?: A Little Help needed to walk in hospital room?: A Little Help needed climbing 3-5 steps with a railing? : A Little 6 Click Score: 20    End of Session Equipment Utilized During Treatment: Gait belt Activity Tolerance: Patient tolerated treatment well Patient left: in chair;with chair alarm set Nurse Communication: Mobility status PT Visit Diagnosis: Muscle weakness (generalized) (M62.81);Difficulty in walking, not elsewhere classified (R26.2)    Time: 6644-0347 PT Time Calculation (min) (ACUTE ONLY): 16 min   Charges:   PT Evaluation $PT Eval Moderate Complexity: Tuck[place], PT, DPT [phone]   Kerry [name] 02/20/2020, 1:07 PM

## 2020-02-24 DIAGNOSIS — R1310 Dysphagia, unspecified: Secondary | ICD-10-CM | POA: Insufficient documentation

## 2020-03-04 DIAGNOSIS — Z85818 Personal history of malignant neoplasm of other sites of lip, oral cavity, and pharynx: Secondary | ICD-10-CM | POA: Insufficient documentation

## 2020-04-27 ENCOUNTER — Ambulatory Visit (INDEPENDENT_AMBULATORY_CARE_PROVIDER_SITE_OTHER): Payer: Medicare Other | Admitting: Urology

## 2020-04-27 ENCOUNTER — Other Ambulatory Visit: Payer: Self-pay

## 2020-04-27 ENCOUNTER — Encounter: Payer: Self-pay | Admitting: Urology

## 2020-04-27 VITALS — BP 121/72 | HR 67 | Ht 63.0 in | Wt 130.0 lb

## 2020-04-27 DIAGNOSIS — N50819 Testicular pain, unspecified: Secondary | ICD-10-CM

## 2020-04-27 NOTE — Progress Notes (Signed)
   04/27/20 2:38 PM   Pamala Hurry [birth year] [id number]  CC: scrotal pain  HPI: I saw [name] in urology clinic for evaluation of left-sided scrotal pain.  He is an extremely comorbid 86 year old male with CHF, COPD on oxygen, diabetes, and ESRD on dialysis for over 3 years who reports a few weeks of left-sided scrotal pain that resolved spontaneously.  He has not had any scrotal pain for the last 2 weeks.  He does take amiodarone.  He denies any prior similar events.  He denies any UTIs.  He does not make any urine.  He denies any fevers or chills.  There is no prior scrotal imaging to review.  The majority of the history is obtained from his wife today secondary to his difficulty with communication with his tracheostomy.  PMH: Past Medical History:  Diagnosis Date  . Cancer ([place])   . CHF (congestive heart failure) ([place])   . COPD (chronic obstructive pulmonary disease) ([place])   . Diabetes mellitus without complication ([place])   . Hypertension   . Renal disorder     Surgical History: Past Surgical History:  Procedure Laterality Date  . BACK SURGERY    . ESOPHAGOGASTRODUODENOSCOPY (EGD) WITH PROPOFOL N/A 02/18/2020   Procedure: ESOPHAGOGASTRODUODENOSCOPY (EGD) WITH PROPOFOL;  Surgeon: [name], MD;  Location: [facility] ENDOSCOPY;  Service: Gastroenterology;  Laterality: N/A;  . TOTAL LARYNGECTOMY    . TRACHELECTOMY     Social History:  reports that he quit smoking about 5 years ago. His smoking use included cigarettes. He has a 10.00 pack-year smoking history. He has never used smokeless tobacco. He reports that he does not drink alcohol and does not use drugs.  Physical Exam: BP 121/72 (BP Location: Left Arm, Patient Position: Sitting, Cuff Size: Normal)   Pulse 67   Ht 5\' 3"  (1.6 m)   Wt 130 lb (59 kg)   BMI 23.03 kg/m    Constitutional:  Alert and oriented, No acute distress. Tracheostomy in place, communication challenging Circumcised phallus with patent meatus, no  penile lesions.  Testicles 20 cc and descended bilaterally without masses.  Testicles bilaterally mildly tender to palpation.  Laboratory Data: Reviewed  Pertinent Imaging: I have personally viewed and interpreted the CT abdomen and pelvis without contrast dated 02/13/2020.  Atrophic kidneys bilaterally with decompressed bladder, no hernias noted.  Assessment & Plan:   He is a extremely comorbid 86 year old male with a few weeks of left-sided scrotal pain that resolved spontaneously, and he has been asymptomatic over the last 2 weeks.  He is anuric at baseline in the setting of his ESRD on dialysis.  We reviewed the possibility of amiodarone-induced epididymitis, and the need to consider this moving forward if he has a recurrent episode of left-sided scrotal pain.  We discussed the pros and cons of a scrotal ultrasound, and he would like to hold off at this time which is very reasonable.  Return precautions were discussed at length.  If recurrent scrotal pain, okay to order scrotal ultrasound.  Consider decreasing amiodarone dose or changing to alternative medication at that time if recurrent amiodarone induced epididymitis  Nickolas Madrid, MD 04/27/2020  [facility] [address] [place], [place] [address] [phone]

## 2020-04-27 NOTE — Patient Instructions (Signed)
If you have recurrent scrotal pain, please call the clinic and we will order a scrotal ultrasound.  Amiodarone can cause inflammation near the testicle called epididymitis in some patients, and if this recurs we may need to ask your cardiologist to decrease your amiodarone dose, or change to a different medication.  There were no abnormal findings on your physical exam today.

## 2020-07-31 ENCOUNTER — Other Ambulatory Visit (INDEPENDENT_AMBULATORY_CARE_PROVIDER_SITE_OTHER): Payer: Self-pay | Admitting: Vascular Surgery

## 2020-07-31 DIAGNOSIS — L03039 Cellulitis of unspecified toe: Secondary | ICD-10-CM

## 2020-07-31 DIAGNOSIS — I739 Peripheral vascular disease, unspecified: Secondary | ICD-10-CM

## 2020-07-31 DIAGNOSIS — R0989 Other specified symptoms and signs involving the circulatory and respiratory systems: Secondary | ICD-10-CM

## 2020-08-02 DIAGNOSIS — I7025 Atherosclerosis of native arteries of other extremities with ulceration: Secondary | ICD-10-CM | POA: Insufficient documentation

## 2020-08-02 NOTE — Progress Notes (Signed)
MRN : [id number]  Kerry [name] is a 87 y.o. (Oct 17, 1933) male who presents with chief complaint of toe problem.  History of Present Illness:   The patient is seen for evaluation of painful lower extremities and diminished pulses associated with ulceration of the left foot.  The patient notes the ulcer has been present for multiple weeks and has not been improving.  It is very painful and has had some drainage.  No specific history of trauma noted by the patient.  The patient denies fever or chills.  the patient does have diabetes which has been difficult to control.  Patient notes prior to the ulcer developing the extremities were painful particularly with ambulation or activity and the discomfort is very consistent day today. Typically, the pain occurs at less than one block, progress is as activity continues to the point that the patient must stop walking. Resting including standing still for several minutes allowed resumption of the activity and the ability to walk a similar distance before stopping again. Uneven terrain and inclined shorten the distance. The pain has been progressive over the past several years.   The patient denies rest pain or dangling of an extremity off the side of the bed during the night for relief. No prior interventions or surgeries.  No history of back problems or DJD of the lumbar sacral spine.   The patient denies amaurosis fugax or recent TIA symptoms. There are no recent neurological changes noted. The patient denies history of DVT, PE or superficial thrombophlebitis. The patient denies recent episodes of angina or shortness of breath.   No outpatient medications have been marked as taking for the 08/03/20 encounter (Appointment) with Delana Meyer, Dolores Lory, MD.    Past Medical History:  Diagnosis Date  . Cancer ([place])   . CHF (congestive heart failure) ([place])   . COPD (chronic obstructive pulmonary disease) ([place])   . Diabetes mellitus without complication  ([place])   . Hypertension   . Renal disorder     Past Surgical History:  Procedure Laterality Date  . BACK SURGERY    . ESOPHAGOGASTRODUODENOSCOPY (EGD) WITH PROPOFOL N/A 02/18/2020   Procedure: ESOPHAGOGASTRODUODENOSCOPY (EGD) WITH PROPOFOL;  Surgeon: [name], MD;  Location: [facility] ENDOSCOPY;  Service: Gastroenterology;  Laterality: N/A;  . TOTAL LARYNGECTOMY    . TRACHELECTOMY      Social History Social History   Tobacco Use  . Smoking status: Former Smoker    Packs/day: 0.50    Years: 20.00    Pack years: 10.00    Types: Cigarettes    Quit date: 12/05/2014    Years since quitting: 5.6  . Smokeless tobacco: Never Used  . Tobacco comment: Quit 12/2014  Vaping Use  . Vaping Use: Never used  Substance Use Topics  . Alcohol use: No  . Drug use: No    Family History Family History  Problem Relation Age of Onset  . CAD Sister   No family history of bleeding/clotting disorders, porphyria or autoimmune disease   Allergies  Allergen Reactions  . Ace Inhibitors Other (See Comments)    Potassium elevated  . Gabapentin Other (See Comments)    Ataxia at 100 mg.  . Losartan Other (See Comments)    Hyperkalemia     REVIEW OF SYSTEMS (Negative unless checked)  Constitutional: [] Weight loss  [] Fever  [] Chills Cardiac: [] Chest pain   [] Chest pressure   [] Palpitations   [] Shortness of breath when laying flat   [] Shortness of breath with exertion. Vascular:  [  x]Pain in legs with walking   [x] Pain in legs at rest  [] History of DVT   [] Phlebitis   [] Swelling in legs   [] Varicose veins   [x] Non-healing ulcers Pulmonary:   [] Uses home oxygen   [] Productive cough   [] Hemoptysis   [] Wheeze  [] COPD   [] Asthma Neurologic:  [] Dizziness   [] Seizures   [] History of stroke   [] History of TIA  [] Aphasia   [] Vissual changes   [] Weakness or numbness in arm   [] Weakness or numbness in leg Musculoskeletal:   [] Joint swelling   [] Joint pain   [] Low back pain Hematologic:  [] Easy bruising  [] Easy  bleeding   [] Hypercoagulable state   [] Anemic Gastrointestinal:  [] Diarrhea   [] Vomiting  [] Gastroesophageal reflux/heartburn   [] Difficulty swallowing. Genitourinary:  [x] Chronic kidney disease   [] Difficult urination  [] Frequent urination   [] Blood in urine Skin:  [] Rashes   [x] Ulcers  Psychological:  [] History of anxiety   []  History of major depression.  Physical Examination  There were no vitals filed for this visit. There is no height or weight on file to calculate BMI. Gen: WD/WN, NAD Head: [place]/AT, No temporalis wasting.  Ear/Nose/Throat: Hearing grossly intact, nares w/o erythema or drainage, poor dentition Eyes: PER, EOMI, sclera nonicteric.  Neck: Supple, no masses.  No bruit or JVD.  Pulmonary:  Good air movement, clear to auscultation bilaterally, no use of accessory muscles.  Cardiac: RRR, normal S1, S2, no Murmurs. Vascular: ulcer medial left great toe along the nail edge with pus Vessel Right Left  Radial Palpable Palpable  Carotid Palpable Palpable  PT Not Palpable Not Palpable  DP Not Palpable Not Palpable  Gastrointestinal: soft, non-distended. No guarding/no peritoneal signs.  Musculoskeletal: M/S 5/5 throughout.  No deformity or atrophy.  Neurologic: CN 2-12 intact. Pain and light touch intact in extremities.  Symmetrical.  Speech is fluent. Motor exam as listed above. Psychiatric: Judgment intact, Mood & affect appropriate for pt's clinical situation. Dermatologic: No rashes or ulcers noted.  No changes consistent with cellulitis.   CBC Lab Results  Component Value Date   WBC 4.6 02/20/2020   HGB 13.7 02/20/2020   HCT 38.2 (L) 02/20/2020   MCV 88.8 02/20/2020   PLT 124 (L) 02/20/2020    BMET    Component Value Date/Time   NA 131 (L) 02/20/2020 0717   K 5.3 (H) 02/20/2020 0717   CL 94 (L) 02/20/2020 0717   CO2 26 02/20/2020 0717   GLUCOSE 136 (H) 02/20/2020 0717   BUN 30 (H) 02/20/2020 0717   CREATININE 6.62 (H) 02/20/2020 0717   CALCIUM 8.6 (L)  02/20/2020 0717   GFRNONAA 7 (L) 02/20/2020 0717   GFRAA 8 (L) 02/20/2020 0717   CrCl cannot be calculated (Patient's most recent lab result is older than the maximum 21 days allowed.).  COAG Lab Results  Component Value Date   INR 1.1 02/20/2020   INR 1.8 (H) 02/13/2020   INR 1.1 12/10/2019    Radiology No results found.   Assessment/Plan 1. Atherosclerosis of native arteries of the extremities with ulceration ([place])  Recommend:  The patient has evidence of severe atherosclerotic changes of both lower extremities associated with ulceration and tissue loss of the left foot.  This represents potential limb threatening ischemia and places the patient at the risk for left limb loss.  Patient should undergo angiography of the left lower extremity with the hope for intervention for limb salvage.  The risks and benefits as well as the alternative therapies was  discussed in detail with the patient.  All questions were answered.  Patient agrees to proceed with left leg angiography.  The patient will follow up with me in the office after the procedure.    2. Atherosclerosis of native coronary artery of native heart without angina pectoris Continue cardiac and antihypertensive medications as already ordered and reviewed, no changes at this time.  Continue statin as ordered and reviewed, no changes at this time  Nitrates PRN for chest pain   3. AF (paroxysmal atrial fibrillation) (HCC) Continue antiarrhythmia medications as already ordered, these medications have been reviewed and there are no changes at this time.  Continue anticoagulation as ordered by Cardiology Service   4. Type 2 diabetes mellitus with other specified complication, with long-term current use of insulin (HCC) Continue hypoglycemic medications as already ordered, these medications have been reviewed and there are no changes at this time.  Hgb A1C to be monitored as already arranged by primary service   5. ESRD  on hemodialysis ([place]) At the present time the patient has adequate dialysis access.  Continue hemodialysis as ordered without interruption.  Avoid nephrotoxic medications and dehydration.  Further plans per nephrology    Hortencia Pilar, MD  08/02/2020 5:37 PM

## 2020-08-02 NOTE — H&P (View-Only) (Signed)
MRN : [id number]  Kerry [name] is a 87 y.o. (07/06/1933) male who presents with chief complaint of toe problem.  History of Present Illness:   The patient is seen for evaluation of painful lower extremities and diminished pulses associated with ulceration of the left foot.  The patient notes the ulcer has been present for multiple weeks and has not been improving.  It is very painful and has had some drainage.  No specific history of trauma noted by the patient.  The patient denies fever or chills.  the patient does have diabetes which has been difficult to control.  Patient notes prior to the ulcer developing the extremities were painful particularly with ambulation or activity and the discomfort is very consistent day today. Typically, the pain occurs at less than one block, progress is as activity continues to the point that the patient must stop walking. Resting including standing still for several minutes allowed resumption of the activity and the ability to walk a similar distance before stopping again. Uneven terrain and inclined shorten the distance. The pain has been progressive over the past several years.   The patient denies rest pain or dangling of an extremity off the side of the bed during the night for relief. No prior interventions or surgeries.  No history of back problems or DJD of the lumbar sacral spine.   The patient denies amaurosis fugax or recent TIA symptoms. There are no recent neurological changes noted. The patient denies history of DVT, PE or superficial thrombophlebitis. The patient denies recent episodes of angina or shortness of breath.   No outpatient medications have been marked as taking for the 08/03/20 encounter (Appointment) with Delana Meyer, Dolores Lory, MD.    Past Medical History:  Diagnosis Date  . Cancer ([place])   . CHF (congestive heart failure) ([place])   . COPD (chronic obstructive pulmonary disease) ([place])   . Diabetes mellitus without complication  ([place])   . Hypertension   . Renal disorder     Past Surgical History:  Procedure Laterality Date  . BACK SURGERY    . ESOPHAGOGASTRODUODENOSCOPY (EGD) WITH PROPOFOL N/A 02/18/2020   Procedure: ESOPHAGOGASTRODUODENOSCOPY (EGD) WITH PROPOFOL;  Surgeon: [name], MD;  Location: [facility] ENDOSCOPY;  Service: Gastroenterology;  Laterality: N/A;  . TOTAL LARYNGECTOMY    . TRACHELECTOMY      Social History Social History   Tobacco Use  . Smoking status: Former Smoker    Packs/day: 0.50    Years: 20.00    Pack years: 10.00    Types: Cigarettes    Quit date: 12/05/2014    Years since quitting: 5.6  . Smokeless tobacco: Never Used  . Tobacco comment: Quit 12/2014  Vaping Use  . Vaping Use: Never used  Substance Use Topics  . Alcohol use: No  . Drug use: No    Family History Family History  Problem Relation Age of Onset  . CAD Sister   No family history of bleeding/clotting disorders, porphyria or autoimmune disease   Allergies  Allergen Reactions  . Ace Inhibitors Other (See Comments)    Potassium elevated  . Gabapentin Other (See Comments)    Ataxia at 100 mg.  . Losartan Other (See Comments)    Hyperkalemia     REVIEW OF SYSTEMS (Negative unless checked)  Constitutional: [] Weight loss  [] Fever  [] Chills Cardiac: [] Chest pain   [] Chest pressure   [] Palpitations   [] Shortness of breath when laying flat   [] Shortness of breath with exertion. Vascular:  [  x]Pain in legs with walking   [x] Pain in legs at rest  [] History of DVT   [] Phlebitis   [] Swelling in legs   [] Varicose veins   [x] Non-healing ulcers Pulmonary:   [] Uses home oxygen   [] Productive cough   [] Hemoptysis   [] Wheeze  [] COPD   [] Asthma Neurologic:  [] Dizziness   [] Seizures   [] History of stroke   [] History of TIA  [] Aphasia   [] Vissual changes   [] Weakness or numbness in arm   [] Weakness or numbness in leg Musculoskeletal:   [] Joint swelling   [] Joint pain   [] Low back pain Hematologic:  [] Easy bruising  [] Easy  bleeding   [] Hypercoagulable state   [] Anemic Gastrointestinal:  [] Diarrhea   [] Vomiting  [] Gastroesophageal reflux/heartburn   [] Difficulty swallowing. Genitourinary:  [x] Chronic kidney disease   [] Difficult urination  [] Frequent urination   [] Blood in urine Skin:  [] Rashes   [x] Ulcers  Psychological:  [] History of anxiety   []  History of major depression.  Physical Examination  There were no vitals filed for this visit. There is no height or weight on file to calculate BMI. Gen: WD/WN, NAD Head: [place]/AT, No temporalis wasting.  Ear/Nose/Throat: Hearing grossly intact, nares w/o erythema or drainage, poor dentition Eyes: PER, EOMI, sclera nonicteric.  Neck: Supple, no masses.  No bruit or JVD.  Pulmonary:  Good air movement, clear to auscultation bilaterally, no use of accessory muscles.  Cardiac: RRR, normal S1, S2, no Murmurs. Vascular: ulcer medial left great toe along the nail edge with pus Vessel Right Left  Radial Palpable Palpable  Carotid Palpable Palpable  PT Not Palpable Not Palpable  DP Not Palpable Not Palpable  Gastrointestinal: soft, non-distended. No guarding/no peritoneal signs.  Musculoskeletal: M/S 5/5 throughout.  No deformity or atrophy.  Neurologic: CN 2-12 intact. Pain and light touch intact in extremities.  Symmetrical.  Speech is fluent. Motor exam as listed above. Psychiatric: Judgment intact, Mood & affect appropriate for pt's clinical situation. Dermatologic: No rashes or ulcers noted.  No changes consistent with cellulitis.   CBC Lab Results  Component Value Date   WBC 4.6 02/20/2020   HGB 13.7 02/20/2020   HCT 38.2 (L) 02/20/2020   MCV 88.8 02/20/2020   PLT 124 (L) 02/20/2020    BMET    Component Value Date/Time   NA 131 (L) 02/20/2020 0717   K 5.3 (H) 02/20/2020 0717   CL 94 (L) 02/20/2020 0717   CO2 26 02/20/2020 0717   GLUCOSE 136 (H) 02/20/2020 0717   BUN 30 (H) 02/20/2020 0717   CREATININE 6.62 (H) 02/20/2020 0717   CALCIUM 8.6 (L)  02/20/2020 0717   GFRNONAA 7 (L) 02/20/2020 0717   GFRAA 8 (L) 02/20/2020 0717   CrCl cannot be calculated (Patient's most recent lab result is older than the maximum 21 days allowed.).  COAG Lab Results  Component Value Date   INR 1.1 02/20/2020   INR 1.8 (H) 02/13/2020   INR 1.1 12/10/2019    Radiology No results found.   Assessment/Plan 1. Atherosclerosis of native arteries of the extremities with ulceration ([place])  Recommend:  The patient has evidence of severe atherosclerotic changes of both lower extremities associated with ulceration and tissue loss of the left foot.  This represents potential limb threatening ischemia and places the patient at the risk for left limb loss.  Patient should undergo angiography of the left lower extremity with the hope for intervention for limb salvage.  The risks and benefits as well as the alternative therapies was  discussed in detail with the patient.  All questions were answered.  Patient agrees to proceed with left leg angiography.  The patient will follow up with me in the office after the procedure.    2. Atherosclerosis of native coronary artery of native heart without angina pectoris Continue cardiac and antihypertensive medications as already ordered and reviewed, no changes at this time.  Continue statin as ordered and reviewed, no changes at this time  Nitrates PRN for chest pain   3. AF (paroxysmal atrial fibrillation) (HCC) Continue antiarrhythmia medications as already ordered, these medications have been reviewed and there are no changes at this time.  Continue anticoagulation as ordered by Cardiology Service   4. Type 2 diabetes mellitus with other specified complication, with long-term current use of insulin (HCC) Continue hypoglycemic medications as already ordered, these medications have been reviewed and there are no changes at this time.  Hgb A1C to be monitored as already arranged by primary service   5. ESRD  on hemodialysis ([place]) At the present time the patient has adequate dialysis access.  Continue hemodialysis as ordered without interruption.  Avoid nephrotoxic medications and dehydration.  Further plans per nephrology    Hortencia Pilar, MD  08/02/2020 5:37 PM

## 2020-08-03 ENCOUNTER — Encounter (INDEPENDENT_AMBULATORY_CARE_PROVIDER_SITE_OTHER): Payer: Self-pay | Admitting: Vascular Surgery

## 2020-08-03 ENCOUNTER — Other Ambulatory Visit: Payer: Self-pay

## 2020-08-03 ENCOUNTER — Ambulatory Visit (INDEPENDENT_AMBULATORY_CARE_PROVIDER_SITE_OTHER): Payer: Medicare Other | Admitting: Vascular Surgery

## 2020-08-03 ENCOUNTER — Ambulatory Visit (INDEPENDENT_AMBULATORY_CARE_PROVIDER_SITE_OTHER): Payer: Medicare Other

## 2020-08-03 VITALS — BP 106/65 | HR 64 | Resp 16 | Ht 66.5 in | Wt 127.8 lb

## 2020-08-03 DIAGNOSIS — I739 Peripheral vascular disease, unspecified: Secondary | ICD-10-CM

## 2020-08-03 DIAGNOSIS — L03039 Cellulitis of unspecified toe: Secondary | ICD-10-CM

## 2020-08-03 DIAGNOSIS — I2510 Atherosclerotic heart disease of native coronary artery without angina pectoris: Secondary | ICD-10-CM

## 2020-08-03 DIAGNOSIS — I7025 Atherosclerosis of native arteries of other extremities with ulceration: Secondary | ICD-10-CM

## 2020-08-03 DIAGNOSIS — N186 End stage renal disease: Secondary | ICD-10-CM

## 2020-08-03 DIAGNOSIS — Z992 Dependence on renal dialysis: Secondary | ICD-10-CM

## 2020-08-03 DIAGNOSIS — I480 Paroxysmal atrial fibrillation: Secondary | ICD-10-CM

## 2020-08-03 DIAGNOSIS — R0989 Other specified symptoms and signs involving the circulatory and respiratory systems: Secondary | ICD-10-CM | POA: Diagnosis not present

## 2020-08-03 DIAGNOSIS — E1169 Type 2 diabetes mellitus with other specified complication: Secondary | ICD-10-CM

## 2020-08-03 DIAGNOSIS — Z794 Long term (current) use of insulin: Secondary | ICD-10-CM

## 2020-08-05 ENCOUNTER — Telehealth (INDEPENDENT_AMBULATORY_CARE_PROVIDER_SITE_OTHER): Payer: Self-pay

## 2020-08-05 NOTE — Telephone Encounter (Signed)
Spoke with the patient's spouse and he is scheduled with Dr. Delana Meyer for a left leg angio on 08/12/20 with a 11:00 am arrival time to the [facility]. Covid testing on 08/10/20 between 8-1 pm at the [facility]. Pre-procedure instructions were discussed and will be mailed.

## 2020-08-09 ENCOUNTER — Encounter (INDEPENDENT_AMBULATORY_CARE_PROVIDER_SITE_OTHER): Payer: Self-pay | Admitting: Vascular Surgery

## 2020-08-09 ENCOUNTER — Other Ambulatory Visit (INDEPENDENT_AMBULATORY_CARE_PROVIDER_SITE_OTHER): Payer: Self-pay | Admitting: Nurse Practitioner

## 2020-08-10 ENCOUNTER — Other Ambulatory Visit
Admission: RE | Admit: 2020-08-10 | Discharge: 2020-08-10 | Disposition: A | Discharge status: Home / Self Care | Payer: Medicare Other | Source: Ambulatory Visit | Attending: Vascular Surgery | Admitting: Vascular Surgery

## 2020-08-10 ENCOUNTER — Other Ambulatory Visit: Payer: Self-pay

## 2020-08-10 DIAGNOSIS — Z01812 Encounter for preprocedural laboratory examination: Secondary | ICD-10-CM | POA: Insufficient documentation

## 2020-08-10 DIAGNOSIS — Z20822 Contact with and (suspected) exposure to covid-19: Secondary | ICD-10-CM | POA: Diagnosis not present

## 2020-08-11 LAB — SARS CORONAVIRUS 2 (TAT 6-24 HRS): SARS Coronavirus 2: NEGATIVE

## 2020-08-12 ENCOUNTER — Other Ambulatory Visit: Payer: Self-pay

## 2020-08-12 ENCOUNTER — Encounter
Admission: RE | Disposition: A | Discharge status: Home / Self Care | Payer: Self-pay | Source: Home / Self Care | Attending: Vascular Surgery

## 2020-08-12 ENCOUNTER — Encounter: Payer: Self-pay | Admitting: Vascular Surgery

## 2020-08-12 ENCOUNTER — Ambulatory Visit
Admission: RE | Admit: 2020-08-12 | Discharge: 2020-08-12 | Disposition: A | Discharge status: Home / Self Care | Payer: Medicare Other | Attending: Vascular Surgery | Admitting: Vascular Surgery

## 2020-08-12 DIAGNOSIS — E1151 Type 2 diabetes mellitus with diabetic peripheral angiopathy without gangrene: Secondary | ICD-10-CM | POA: Insufficient documentation

## 2020-08-12 DIAGNOSIS — I70262 Atherosclerosis of native arteries of extremities with gangrene, left leg: Secondary | ICD-10-CM | POA: Diagnosis not present

## 2020-08-12 DIAGNOSIS — Z87891 Personal history of nicotine dependence: Secondary | ICD-10-CM | POA: Diagnosis not present

## 2020-08-12 DIAGNOSIS — E1122 Type 2 diabetes mellitus with diabetic chronic kidney disease: Secondary | ICD-10-CM | POA: Diagnosis not present

## 2020-08-12 DIAGNOSIS — Z992 Dependence on renal dialysis: Secondary | ICD-10-CM | POA: Insufficient documentation

## 2020-08-12 DIAGNOSIS — I2510 Atherosclerotic heart disease of native coronary artery without angina pectoris: Secondary | ICD-10-CM | POA: Diagnosis not present

## 2020-08-12 DIAGNOSIS — I70245 Atherosclerosis of native arteries of left leg with ulceration of other part of foot: Secondary | ICD-10-CM | POA: Insufficient documentation

## 2020-08-12 DIAGNOSIS — I480 Paroxysmal atrial fibrillation: Secondary | ICD-10-CM | POA: Insufficient documentation

## 2020-08-12 DIAGNOSIS — L97529 Non-pressure chronic ulcer of other part of left foot with unspecified severity: Secondary | ICD-10-CM | POA: Diagnosis not present

## 2020-08-12 DIAGNOSIS — I70219 Atherosclerosis of native arteries of extremities with intermittent claudication, unspecified extremity: Secondary | ICD-10-CM

## 2020-08-12 DIAGNOSIS — E11621 Type 2 diabetes mellitus with foot ulcer: Secondary | ICD-10-CM | POA: Diagnosis not present

## 2020-08-12 DIAGNOSIS — Z888 Allergy status to other drugs, medicaments and biological substances status: Secondary | ICD-10-CM | POA: Diagnosis not present

## 2020-08-12 DIAGNOSIS — N186 End stage renal disease: Secondary | ICD-10-CM | POA: Diagnosis not present

## 2020-08-12 HISTORY — PX: LOWER EXTREMITY ANGIOGRAPHY: CATH118251

## 2020-08-12 LAB — GLUCOSE, CAPILLARY
Glucose-Capillary: 145 mg/dL — ABNORMAL HIGH (ref 70–99)
Glucose-Capillary: 172 mg/dL — ABNORMAL HIGH (ref 70–99)

## 2020-08-12 LAB — POTASSIUM (ARMC VASCULAR LAB ONLY): Potassium (ARMC vascular lab): 4.1 (ref 3.5–5.1)

## 2020-08-12 SURGERY — LOWER EXTREMITY ANGIOGRAPHY
Anesthesia: Moderate Sedation | Site: Leg Lower | Laterality: Left

## 2020-08-12 MED ORDER — HYDROMORPHONE HCL 1 MG/ML IJ SOLN: 1.0000 mg | Freq: Once | INTRAMUSCULAR | Status: DC | PRN

## 2020-08-12 MED ORDER — CEFAZOLIN SODIUM-DEXTROSE 1-4 GM/50ML-% IV SOLN
INTRAVENOUS | Status: AC
  Filled 2020-08-12: qty 50

## 2020-08-12 MED ORDER — MIDAZOLAM HCL 5 MG/5ML IJ SOLN
INTRAMUSCULAR | Status: AC
  Filled 2020-08-12: qty 5

## 2020-08-12 MED ORDER — CEFAZOLIN SODIUM-DEXTROSE 2-4 GM/100ML-% IV SOLN
INTRAVENOUS | Status: AC
  Filled 2020-08-12: qty 100

## 2020-08-12 MED ORDER — METHYLPREDNISOLONE SODIUM SUCC 125 MG IJ SOLR: 125.0000 mg | Freq: Once | INTRAMUSCULAR | Status: DC | PRN

## 2020-08-12 MED ORDER — FENTANYL CITRATE (PF) 100 MCG/2ML IJ SOLN
INTRAMUSCULAR | Status: AC
  Filled 2020-08-12: qty 2

## 2020-08-12 MED ORDER — DIPHENHYDRAMINE HCL 50 MG/ML IJ SOLN: 50.0000 mg | Freq: Once | INTRAMUSCULAR | Status: DC | PRN

## 2020-08-12 MED ORDER — SODIUM CHLORIDE 0.9 % IV SOLN: INTRAVENOUS | Status: DC

## 2020-08-12 MED ORDER — CEFAZOLIN SODIUM-DEXTROSE 1-4 GM/50ML-% IV SOLN
1.0000 g | Freq: Once | INTRAVENOUS | Status: AC
  Administered 2020-08-12: 1 g via INTRAVENOUS

## 2020-08-12 MED ORDER — SODIUM CHLORIDE 0.9% FLUSH: 3.0000 mL | INTRAVENOUS | Status: DC | PRN

## 2020-08-12 MED ORDER — MIDAZOLAM HCL 2 MG/2ML IJ SOLN
INTRAMUSCULAR | Status: DC | PRN
  Administered 2020-08-12: 2 mg via INTRAVENOUS
  Administered 2020-08-12: 1 mg via INTRAVENOUS

## 2020-08-12 MED ORDER — LABETALOL HCL 5 MG/ML IV SOLN: 10.0000 mg | INTRAVENOUS | Status: DC | PRN

## 2020-08-12 MED ORDER — OXYCODONE HCL 5 MG PO TABS: 5.0000 mg | ORAL_TABLET | ORAL | Status: DC | PRN

## 2020-08-12 MED ORDER — IODIXANOL 320 MG/ML IV SOLN
INTRAVENOUS | Status: DC | PRN
  Administered 2020-08-12: 45 mL

## 2020-08-12 MED ORDER — MORPHINE SULFATE (PF) 4 MG/ML IV SOLN: 2.0000 mg | INTRAVENOUS | Status: DC | PRN

## 2020-08-12 MED ORDER — ONDANSETRON HCL 4 MG/2ML IJ SOLN: 4.0000 mg | Freq: Four times a day (QID) | INTRAMUSCULAR | Status: DC | PRN

## 2020-08-12 MED ORDER — HYDRALAZINE HCL 20 MG/ML IJ SOLN: 5.0000 mg | INTRAMUSCULAR | Status: DC | PRN

## 2020-08-12 MED ORDER — HEPARIN SODIUM (PORCINE) 1000 UNIT/ML IJ SOLN
INTRAMUSCULAR | Status: AC
  Filled 2020-08-12: qty 1

## 2020-08-12 MED ORDER — SODIUM CHLORIDE 0.9% FLUSH: 3.0000 mL | Freq: Two times a day (BID) | INTRAVENOUS | Status: DC

## 2020-08-12 MED ORDER — SODIUM CHLORIDE 0.9 % IV SOLN: 250.0000 mL | INTRAVENOUS | Status: DC | PRN

## 2020-08-12 MED ORDER — CEFAZOLIN SODIUM-DEXTROSE 2-4 GM/100ML-% IV SOLN: 2.0000 g | Freq: Once | INTRAVENOUS | Status: DC

## 2020-08-12 MED ORDER — FENTANYL CITRATE (PF) 100 MCG/2ML IJ SOLN
INTRAMUSCULAR | Status: DC | PRN
  Administered 2020-08-12: 25 ug via INTRAVENOUS
  Administered 2020-08-12: 50 ug via INTRAVENOUS

## 2020-08-12 MED ORDER — MIDAZOLAM HCL 2 MG/ML PO SYRP: 8.0000 mg | ORAL_SOLUTION | Freq: Once | ORAL | Status: DC | PRN

## 2020-08-12 MED ORDER — FAMOTIDINE 20 MG PO TABS: 40.0000 mg | ORAL_TABLET | Freq: Once | ORAL | Status: DC | PRN

## 2020-08-12 MED ORDER — ACETAMINOPHEN 325 MG PO TABS: 650.0000 mg | ORAL_TABLET | ORAL | Status: DC | PRN

## 2020-08-12 SURGICAL SUPPLY — 13 items
CANNULA 5F STIFF (CANNULA) ×2 IMPLANT
CATH ANGIO 5F PIGTAIL 65CM (CATHETERS) ×2 IMPLANT
CATH BERNSTEIN 5FR 130CM (CATHETERS) ×2 IMPLANT
COVER PROBE U/S 5X48 (MISCELLANEOUS) ×2 IMPLANT
DEVICE SAFEGUARD 24CM (GAUZE/BANDAGES/DRESSINGS) ×2 IMPLANT
DEVICE STARCLOSE SE CLOSURE (Vascular Products) ×2 IMPLANT
GLIDEWIRE ADV .035X260CM (WIRE) ×2 IMPLANT
KIT ENCORE 26 ADVANTAGE (KITS) IMPLANT
PACK ANGIOGRAPHY (CUSTOM PROCEDURE TRAY) ×2 IMPLANT
SHEATH BRITE TIP 5FRX11 (SHEATH) ×2 IMPLANT
SYR MEDRAD MARK 7 150ML (SYRINGE) ×2 IMPLANT
TUBING CONTRAST HIGH PRESS 72 (TUBING) ×2 IMPLANT
WIRE GUIDERIGHT .035X150 (WIRE) ×2 IMPLANT

## 2020-08-12 NOTE — Op Note (Signed)
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  Abdominal aortogram  2.  Left lower extremity distal runoff third order catheter placement  3.  Star close right common femoral artery   Anesthesia: Conscious sedation was administered by the interventional radiology RN under my direct supervision. IV Versed plus fentanyl were utilized. Continuous ECG, pulse oximetry and blood pressure was monitored throughout the entire procedure.  Conscious sedation was administered for a total of 46 minutes and 34 seconds.  Sheath: 5 French Pinnacle right common femoral retrograde  Contrast: 45 cc   Fluoroscopy Time: 3.6 minutes  Indications:  The patient presents to [facility] with gangrenous changes to the left foot.  Pedal pulses are nonpalpable bilaterally and ABIs have been obtained suggesting atherosclerotic occlusive disease.  The risks and benefits as well as alternative therapies for lower extremity revascularization are reviewed with the patient all questions are answered the patient agrees to proceed.  The patient is therefore undergoing angiography with the hope for intervention for limb salvage.   Procedure:  Kerry [name] a 87 y.o. male who was identified and appropriate procedural time out was performed.  The patient was then placed supine on the table and prepped and draped in the usual sterile fashion.  Ultrasound was used to evaluate the right common femoral artery.  It was echolucent and pulsatile indicating it is patent .  An ultrasound image was acquired for the permanent record.  A micropuncture needle was used to access the right common femoral artery under direct ultrasound guidance.  The  microwire was then advanced under fluoroscopic guidance without difficulty followed by the micro-sheath.  A 0.035 J wire was advanced without resistance and a 5Fr sheath was placed.    Pigtail catheter was then advanced to the level of T12 and AP projection of the aorta was obtained. Pigtail catheter was then repositioned to above the bifurcation and RAO view of the pelvis was obtained. Stiff angled Glidewire and pigtail catheter was then used across the bifurcation and the catheter was positioned in the distal external iliac artery.  LAO of the left groin was then obtained. Wire was reintroduced and negotiated into the SFA and the catheter was advanced into the SFA. Distal runoff was then performed.  After review of the images the catheter was removed over wire and an RAO view of the groin was obtained. StarClose device was deployed without difficulty.   Findings:   Aortogram: The abdominal aorta is opacified with a bolus injection contrast.  There is diffuse disease but there are no hemodynamically significant lesions.  The aortic bifurcation is widely patent.  The bilateral common and external iliac arteries again show diffuse disease but no hemodynamically significant stenoses.  Right Lower Extremity: The right common femoral demonstrates moderate to severe 50 to 60% stenosis within the mid and distal common femoral.  Origins of the SFA are heavily diseased with greater than 70% stenosis origin of the profunda femoris is patent and the visualized portions of the profunda femoris are patent  Left Lower Extremity: The left common femoral demonstrates a greater than 80% stenosis in its midportion again this is a coral reef-like plaque.  This extends to the ostia of the superficial femoral which has greater than 90% stenosis.  Essentially this looks like  a plaque that is mostly in the distal most common femoral extending into the superficial femoral.  It is not amenable to a typical angioplasty.  The SFA  and popliteal demonstrate diffuse disease but otherwise have no other hemodynamically significant lesions.  Unfortunately, the trifurcation is profoundly diseased with occlusion of all 3 tibial arteries throughout their course essentially in their entirety there are numerous small collaterals present.  At the level of the ankle there is reconstitution of the dorsalis pedis which does fill the pedal arch.  There is reconstitution begins essentially at the level of the malleolus I and I am uncertain as to whether this is the distal anterior tibial reconstituting or whether it is the distal peroneal.  However the dorsalis pedis does appear to be a good target for bypass.  Summary: Given the patient's severe common femoral disease as well as the profound occlusive disease within the tibial vessels that also takes into consideration the very long length of the occlusions I have discussed with the patient's wife that open femoral endarterectomy and then likely a popliteal to distal bypass be performed for limb salvage.  I have described in detail to her why intervention would be very unlikely to be successful and has significant downsides given this pattern of disease.   Disposition: Patient was taken to the recovery room in stable condition having tolerated the procedure well.  Kerry [name] 08/12/2020,1:35 PM

## 2020-08-12 NOTE — Interval H&P Note (Signed)
History and Physical Interval Note:  08/12/2020 12:11 PM  Kerry [name]  has presented today for surgery, with the diagnosis of LT lower extremity angioo   BARD   ASO w claudication Covid  March 4.  The various methods of treatment have been discussed with the patient and family. After consideration of risks, benefits and other options for treatment, the patient has consented to  Procedure(s): LOWER EXTREMITY ANGIOGRAPHY (Left) as a surgical intervention.  The patient's history has been reviewed, patient examined, no change in status, stable for surgery.  I have reviewed the patient's chart and labs.  Questions were answered to the patient's satisfaction.     Hortencia Pilar

## 2020-08-12 NOTE — Discharge Instructions (Signed)
Femoral Site Care  This sheet gives you information about how to care for yourself after your procedure. Your health care provider may also give you more specific instructions. If you have problems or questions, contact your health care provider. What can I expect after the procedure? After the procedure, it is common to have:  Bruising that usually fades within 1-2 weeks.  Tenderness at the site. Follow these instructions at home: Wound care  Follow instructions from your health care provider about how to take care of your insertion site. Make sure you: ? Wash your hands with soap and water before you change your bandage (dressing). If soap and water are not available, use hand sanitizer. ? Change your dressing as told by your health care provider. ? Leave stitches (sutures), skin glue, or adhesive strips in place. These skin closures may need to stay in place for 2 weeks or longer. If adhesive strip edges start to loosen and curl up, you may trim the loose edges. Do not remove adhesive strips completely unless your health care provider tells you to do that.  Do not take baths, swim, or use a hot tub until your health care provider approves.  You may shower 24-48 hours after the procedure or as told by your health care provider. ? Gently wash the site with plain soap and water. ? Pat the area dry with a clean towel. ? Do not rub the site. This may cause bleeding.  Do not apply powder or lotion to the site. Keep the site clean and dry.  Check your femoral site every day for signs of infection. Check for: ? Redness, swelling, or pain. ? Fluid or blood. ? Warmth. ? Pus or a bad smell. Activity  For the first 2-3 days after your procedure, or as long as directed: ? Avoid climbing stairs as much as possible. ? Do not squat.  Do not lift anything that is heavier than 10 lb (4.5 kg), or the limit that you are told, until your health care provider says that it is safe.  Rest as  directed. ? Avoid sitting for a long time without moving. Get up to take short walks every 1-2 hours.  Do not drive for 24 hours if you were given a medicine to help you relax (sedative). General instructions  Take over-the-counter and prescription medicines only as told by your health care provider.  Keep all follow-up visits as told by your health care provider. This is important. Contact a health care provider if you have:  A fever or chills.  You have redness, swelling, or pain around your insertion site. Get help right away if:  The catheter insertion area swells very fast.  You pass out.  You suddenly start to sweat or your skin gets clammy.  The catheter insertion area is bleeding, and the bleeding does not stop when you hold steady pressure on the area.  The area near or just beyond the catheter insertion site becomes pale, cool, tingly, or numb. These symptoms may represent a serious problem that is an emergency. Do not wait to see if the symptoms will go away. Get medical help right away. Call your local emergency services (911 in the [place] Do not drive yourself to the hospital. Summary  After the procedure, it is common to have bruising that usually fades within 1-2 weeks.  Check your femoral site every day for signs of infection.  Do not lift anything that is heavier than 10 lb (4.5 kg), or   the limit that you are told, until your health care provider says that it is safe. This information is not intended to replace advice given to you by your health care provider. Make sure you discuss any questions you have with your health care provider. Document Revised: 01/24/2020 Document Reviewed: 01/24/2020 [employer] Patient Education  2021 [employer]  

## 2020-08-13 ENCOUNTER — Encounter: Payer: Self-pay | Admitting: Vascular Surgery

## 2020-08-16 NOTE — Progress Notes (Signed)
MRN : [id number]  Kerry [name] is a 87 y.o. ([birth year]) male who presents with chief complaint of No chief complaint on file. Marland Kitchen  History of Present Illness:   The patient returns to the office for followup and review status post angiogram without intervention. The patient notes his lower extremity symptoms are the same, perhaps a bit worse. No interval change in the patient's claudication distance but increase in rest pain symptoms. Previous wounds remain.  No new ulcers or wounds have occurred since the last visit.  There have been no significant changes to the patient's overall health care.  The patient denies amaurosis fugax or recent TIA symptoms. There are no recent neurological changes noted. The patient denies history of DVT, PE or superficial thrombophlebitis. The patient denies recent episodes of angina or shortness of breath.    No outpatient medications have been marked as taking for the 08/17/20 encounter (Appointment) with Delana Meyer, Dolores Lory, MD.    Past Medical History:  Diagnosis Date  . Cancer ([place])   . CHF (congestive heart failure) ([place])   . COPD (chronic obstructive pulmonary disease) ([place])   . Diabetes mellitus without complication ([place])   . Hypertension   . Renal disorder     Past Surgical History:  Procedure Laterality Date  . BACK SURGERY    . ESOPHAGOGASTRODUODENOSCOPY (EGD) WITH PROPOFOL N/A 02/18/2020   Procedure: ESOPHAGOGASTRODUODENOSCOPY (EGD) WITH PROPOFOL;  Surgeon: [name], MD;  Location[facility] ENDOSCOPY;  Service: Gastroenterology;  Laterality: N/A;  . LOWER EXTREMITY ANGIOGRAPHY Left 08/12/2020   Procedure: LOWER EXTREMITY ANGIOGRAPHY;  Surgeon: Katha Cabal, MD;  Location: [facility] CV LAB;  Service: Cardiovascular;  Laterality: Left;  . TOTAL LARYNGECTOMY    . TRACHELECTOMY      Social History Social History   Tobacco Use  . Smoking status: Former Smoker    Packs/day: 0.50    Years: 20.00    Pack years: 10.00    Types:  Cigarettes    Quit date: 12/05/2014    Years since quitting: 5.7  . Smokeless tobacco: Never Used  . Tobacco comment: Quit 12/2014  Vaping Use  . Vaping Use: Never used  Substance Use Topics  . Alcohol use: No  . Drug use: No    Family History Family History  Problem Relation Age of Onset  . CAD Sister     Allergies  Allergen Reactions  . Ace Inhibitors Other (See Comments)    Potassium elevated  . Gabapentin Other (See Comments)    Ataxia at 100 mg.  . Losartan Other (See Comments)    Hyperkalemia     REVIEW OF SYSTEMS (Negative unless checked)  Constitutional: [] Weight loss  [] Fever  [] Chills Cardiac: [] Chest pain   [] Chest pressure   [] Palpitations   [] Shortness of breath when laying flat   [] Shortness of breath with exertion. Vascular:  [x] Pain in legs with walking   [x] Pain in legs at rest  [] History of DVT   [] Phlebitis   [] Swelling in legs   [] Varicose veins   [x] Non-healing ulcers Pulmonary:   [] Uses home oxygen   [] Productive cough   [] Hemoptysis   [] Wheeze  [] COPD   [] Asthma Neurologic:  [] Dizziness   [] Seizures   [] History of stroke   [] History of TIA  [] Aphasia   [] Vissual changes   [] Weakness or numbness in arm   [] Weakness or numbness in leg Musculoskeletal:   [] Joint swelling   [x] Joint pain   [] Low back pain Hematologic:  [] Easy bruising  [] Easy  bleeding   [] Hypercoagulable state   [] Anemic Gastrointestinal:  [] Diarrhea   [] Vomiting  [] Gastroesophageal reflux/heartburn   [] Difficulty swallowing. Genitourinary:  [x] Chronic kidney disease   [] Difficult urination  [] Frequent urination   [] Blood in urine Skin:  [] Rashes   [x] Ulcers  Psychological:  [] History of anxiety   []  History of major depression.  Physical Examination  There were no vitals filed for this visit. There is no height or weight on file to calculate BMI. Gen: WD/WN, NAD Head: [place]/AT, No temporalis wasting.  Ear/Nose/Throat: Hearing grossly intact, nares w/o erythema or drainage Eyes: PER,  EOMI, sclera nonicteric.  Neck: Supple, no large masses.   Pulmonary:  Good air movement, no audible wheezing bilaterally, no use of accessory muscles.  Cardiac: RRR, no JVD Vascular:  Ulcer of the left great toe, infected; left arm AV graft good thrill good bruit Vessel Right Left  Radial Palpable Palpable  PT Not Palpable Not Palpable  DP Not Palpable Not Palpable  Gastrointestinal: Non-distended. No guarding/no peritoneal signs.  Musculoskeletal: M/S 5/5 throughout.  No deformity or atrophy.  Neurologic: CN 2-12 intact. Symmetrical.  Speech is fluent. Motor exam as listed above. Psychiatric: Judgment intact, Mood & affect appropriate for pt's clinical situation. Dermatologic: No rashes or ulcers noted.  No changes consistent with cellulitis.   CBC Lab Results  Component Value Date   WBC 4.6 02/20/2020   HGB 13.7 02/20/2020   HCT 38.2 (L) 02/20/2020   MCV 88.8 02/20/2020   PLT 124 (L) 02/20/2020    BMET    Component Value Date/Time   NA 131 (L) 02/20/2020 0717   K 5.3 (H) 02/20/2020 0717   CL 94 (L) 02/20/2020 0717   CO2 26 02/20/2020 0717   GLUCOSE 136 (H) 02/20/2020 0717   BUN 30 (H) 02/20/2020 0717   CREATININE 6.62 (H) 02/20/2020 0717   CALCIUM 8.6 (L) 02/20/2020 0717   GFRNONAA 7 (L) 02/20/2020 0717   GFRAA 8 (L) 02/20/2020 0717   CrCl cannot be calculated (Patient's most recent lab result is older than the maximum 21 days allowed.).  COAG Lab Results  Component Value Date   INR 1.1 02/20/2020   INR 1.8 (H) 02/13/2020   INR 1.1 12/10/2019    Radiology PERIPHERAL VASCULAR CATHETERIZATION  Result Date: 08/12/2020 See Op Note  VAS [place] ABI WITH/WO TBI  Result Date: 08/03/2020 LOWER EXTREMITY DOPPLER STUDY Indications: Peripheral artery disease, and Non palpable pulses.  Performing Technologist: Charlane Ferretti RT (R)(VS)  Examination Guidelines: A complete evaluation includes at minimum, Doppler waveform signals and systolic blood pressure reading at the  level of bilateral brachial, anterior tibial, and posterior tibial arteries, when vessel segments are accessible. Bilateral testing is considered an integral part of a complete examination. Photoelectric Plethysmograph (PPG) waveforms and toe systolic pressure readings are included as required and additional duplex testing as needed. Limited examinations for reoccurring indications may be performed as noted.  ABI Findings: +---------+------------------+-----+----------+--------+ Right    Rt Pressure (mmHg)IndexWaveform  Comment  +---------+------------------+-----+----------+--------+ Brachial 139                                       +---------+------------------+-----+----------+--------+ ATA      122               0.88 monophasic         +---------+------------------+-----+----------+--------+ PTA      98  0.71 monophasic         +---------+------------------+-----+----------+--------+ [facility]               0.81 Abnormal           +---------+------------------+-----+----------+--------+ +---------+------------------+-----+----------+----------------+ Left     Lt Pressure (mmHg)IndexWaveform  Comment          +---------+------------------+-----+----------+----------------+ Brachial                                  Dialysis access  +---------+------------------+-----+----------+----------------+ ATA                             monophasicNon compressable +---------+------------------+-----+----------+----------------+ PTA      75                0.54 monophasic                 +---------+------------------+-----+----------+----------------+ Great Toe54                0.39 Abnormal                   +---------+------------------+-----+----------+----------------+ Summary: Right: Resting right ankle-brachial index indicates mild right lower extremity arterial disease. The right toe-brachial index is normal. Although ankle brachial indices  are within normal limits (0.95-1.29), arterial Doppler waveforms at the ankle suggest some component of arterial occlusive disease. Left: Resting left ankle-brachial index indicates noncompressible left lower extremity arteries. The left toe-brachial index is abnormal. *See table(s) above for measurements and observations.  Electronically signed by Hortencia Pilar MD on 08/03/2020 at 5:19:46 PM.   Final      Assessment/Plan 1. Atherosclerosis of native arteries of the extremities with ulceration ([place])  Recommend:  The patient has evidence of severe atherosclerotic changes of both lower extremities associated with ulceration and tissue loss of the left foot.  This represents a limb threatening ischemia and places the patient at the risk for left limb loss.  Angiography has been performed and the situation is not ideal for intervention.  Given this finding open surgical repair is recommended.   Patient should undergo arterial reconstruction of the lower extremity with the hope for limb salvage.  The risks and benefits as well as the alternative therapies was discussed in detail with the patient.  All questions were answered.  Patient agrees to proceed with left femoral endarterectomy surgery.  The patient will follow up with me in the office after the procedure.   2. AF (paroxysmal atrial fibrillation) (HCC) Continue antiarrhythmia medications as already ordered, these medications have been reviewed and there are no changes at this time.  Continue anticoagulation as ordered by Cardiology Service   3. Atherosclerosis of native coronary artery of native heart without angina pectoris Continue cardiac and antihypertensive medications as already ordered and reviewed, no changes at this time.  Continue statin as ordered and reviewed, no changes at this time  Nitrates PRN for chest pain   4. Primary hypertension Continue antihypertensive medications as already ordered, these medications have  been reviewed and there are no changes at this time.   5. Type 2 diabetes mellitus with other specified complication, with long-term current use of insulin (HCC) Continue hypoglycemic medications as already ordered, these medications have been reviewed and there are no changes at this time.  Hgb A1C to be monitored as already arranged by primary service     Hortencia Pilar, MD  08/16/2020 11:21 AM

## 2020-08-17 ENCOUNTER — Ambulatory Visit (INDEPENDENT_AMBULATORY_CARE_PROVIDER_SITE_OTHER): Payer: Medicare Other | Admitting: Vascular Surgery

## 2020-08-17 ENCOUNTER — Other Ambulatory Visit: Payer: Self-pay

## 2020-08-17 ENCOUNTER — Encounter (INDEPENDENT_AMBULATORY_CARE_PROVIDER_SITE_OTHER): Payer: Self-pay | Admitting: Vascular Surgery

## 2020-08-17 VITALS — BP 93/50 | HR 87 | Resp 17 | Wt 126.6 lb

## 2020-08-17 DIAGNOSIS — Z794 Long term (current) use of insulin: Secondary | ICD-10-CM

## 2020-08-17 DIAGNOSIS — I480 Paroxysmal atrial fibrillation: Secondary | ICD-10-CM

## 2020-08-17 DIAGNOSIS — I10 Essential (primary) hypertension: Secondary | ICD-10-CM

## 2020-08-17 DIAGNOSIS — I7025 Atherosclerosis of native arteries of other extremities with ulceration: Secondary | ICD-10-CM

## 2020-08-17 DIAGNOSIS — I2510 Atherosclerotic heart disease of native coronary artery without angina pectoris: Secondary | ICD-10-CM

## 2020-08-17 DIAGNOSIS — E1169 Type 2 diabetes mellitus with other specified complication: Secondary | ICD-10-CM

## 2020-08-18 ENCOUNTER — Telehealth (INDEPENDENT_AMBULATORY_CARE_PROVIDER_SITE_OTHER): Payer: Self-pay | Admitting: Vascular Surgery

## 2020-08-18 ENCOUNTER — Encounter (INDEPENDENT_AMBULATORY_CARE_PROVIDER_SITE_OTHER): Payer: Self-pay | Admitting: Vascular Surgery

## 2020-08-18 ENCOUNTER — Other Ambulatory Visit (INDEPENDENT_AMBULATORY_CARE_PROVIDER_SITE_OTHER): Payer: Self-pay

## 2020-08-18 NOTE — Telephone Encounter (Signed)
Silvadene 1% cream twice a day 50gram has been called into pharmacy on file and patient wife has been made aware

## 2020-08-18 NOTE — Telephone Encounter (Signed)
Wife called stating that [place] was to send a RX to the pharmacy yesterday,  she called the pharmacy today and it hasn't been sent. Patient was last seen 08/17/20 with GS. Please advise.

## 2020-08-21 ENCOUNTER — Telehealth (INDEPENDENT_AMBULATORY_CARE_PROVIDER_SITE_OTHER): Payer: Self-pay | Admitting: Vascular Surgery

## 2020-08-21 NOTE — Telephone Encounter (Signed)
I made patient wife aware that once our office receive cardiac clearance our surgical scheduler will reach out to the patient to schedule for surgery

## 2020-08-21 NOTE — Telephone Encounter (Signed)
Patient spouse called in inquiring if we received cardio clearance from patients cardiologist for surgery with Dr. Delana Meyer.  Patients spouse stated we were supposed to send over a form for the cardiologist to sign and wanted to know if this have been done.  Cardiologist name is Dr. Ubaldo Glassing and fax number is [phone].  Please advise.

## 2020-08-24 ENCOUNTER — Telehealth (INDEPENDENT_AMBULATORY_CARE_PROVIDER_SITE_OTHER): Payer: Self-pay | Admitting: Vascular Surgery

## 2020-08-24 NOTE — Telephone Encounter (Signed)
Called stating that she has contacted our office multiple times requesting medical clearance. Dr. Bethanne Ginger office stated that they sent over form and waiting on our office to send form back via fax. Wife stated that she will be here tomorrow if form is not sent back to Dr. Bethanne Ginger office today. I advised her that the surgery scheduler is out of clinic and we will do what we can to get this done   This note is for documentation purposes.

## 2020-09-02 ENCOUNTER — Telehealth (INDEPENDENT_AMBULATORY_CARE_PROVIDER_SITE_OTHER): Payer: Self-pay

## 2020-09-02 NOTE — Telephone Encounter (Signed)
I contact the patient's spouse on 09/01/20 about a date for the patient's surgery, it was agreed upon for 09/05/2020. Patient was scheduled for a left femoral endarterectomy on 09/08/2020, phone call pre-op on 09/11/20 between 1-5 pm and covid testing on 09/21/20 between 8-2 pm at [facility]. I attempted to contact the spouse and a message was left for a return call.

## 2020-09-07 ENCOUNTER — Telehealth (INDEPENDENT_AMBULATORY_CARE_PROVIDER_SITE_OTHER): Payer: Self-pay

## 2020-09-07 NOTE — Telephone Encounter (Signed)
Patient's wife Minette Brine called to cancel the patient's surgery, she stated that she didn't want to wait that long and she was taking him to [facility] to have his foot looked at.

## 2020-09-11 ENCOUNTER — Inpatient Hospital Stay: Admission: RE | Admit: 2020-09-11 | Payer: Medicare Other | Source: Ambulatory Visit

## 2020-09-21 ENCOUNTER — Other Ambulatory Visit: Payer: Medicare Other

## 2020-09-23 ENCOUNTER — Inpatient Hospital Stay: Admit: 2020-09-23 | Payer: Medicare Other | Admitting: Vascular Surgery

## 2020-09-23 SURGERY — ENDARTERECTOMY, FEMORAL
Anesthesia: General | Laterality: Left

## 2020-10-04 DEATH — deceased

## 2021-09-04 IMAGING — DX DG CHEST 1V PORT
1 series · 1 of 1 positions shown · non-contrast
Comparison: Chest x-ray dated 07/19/2017

CLINICAL DATA: Status post cardiac arrest during dialysis.

EXAM:
PORTABLE CHEST 1 VIEW

[chest ap]
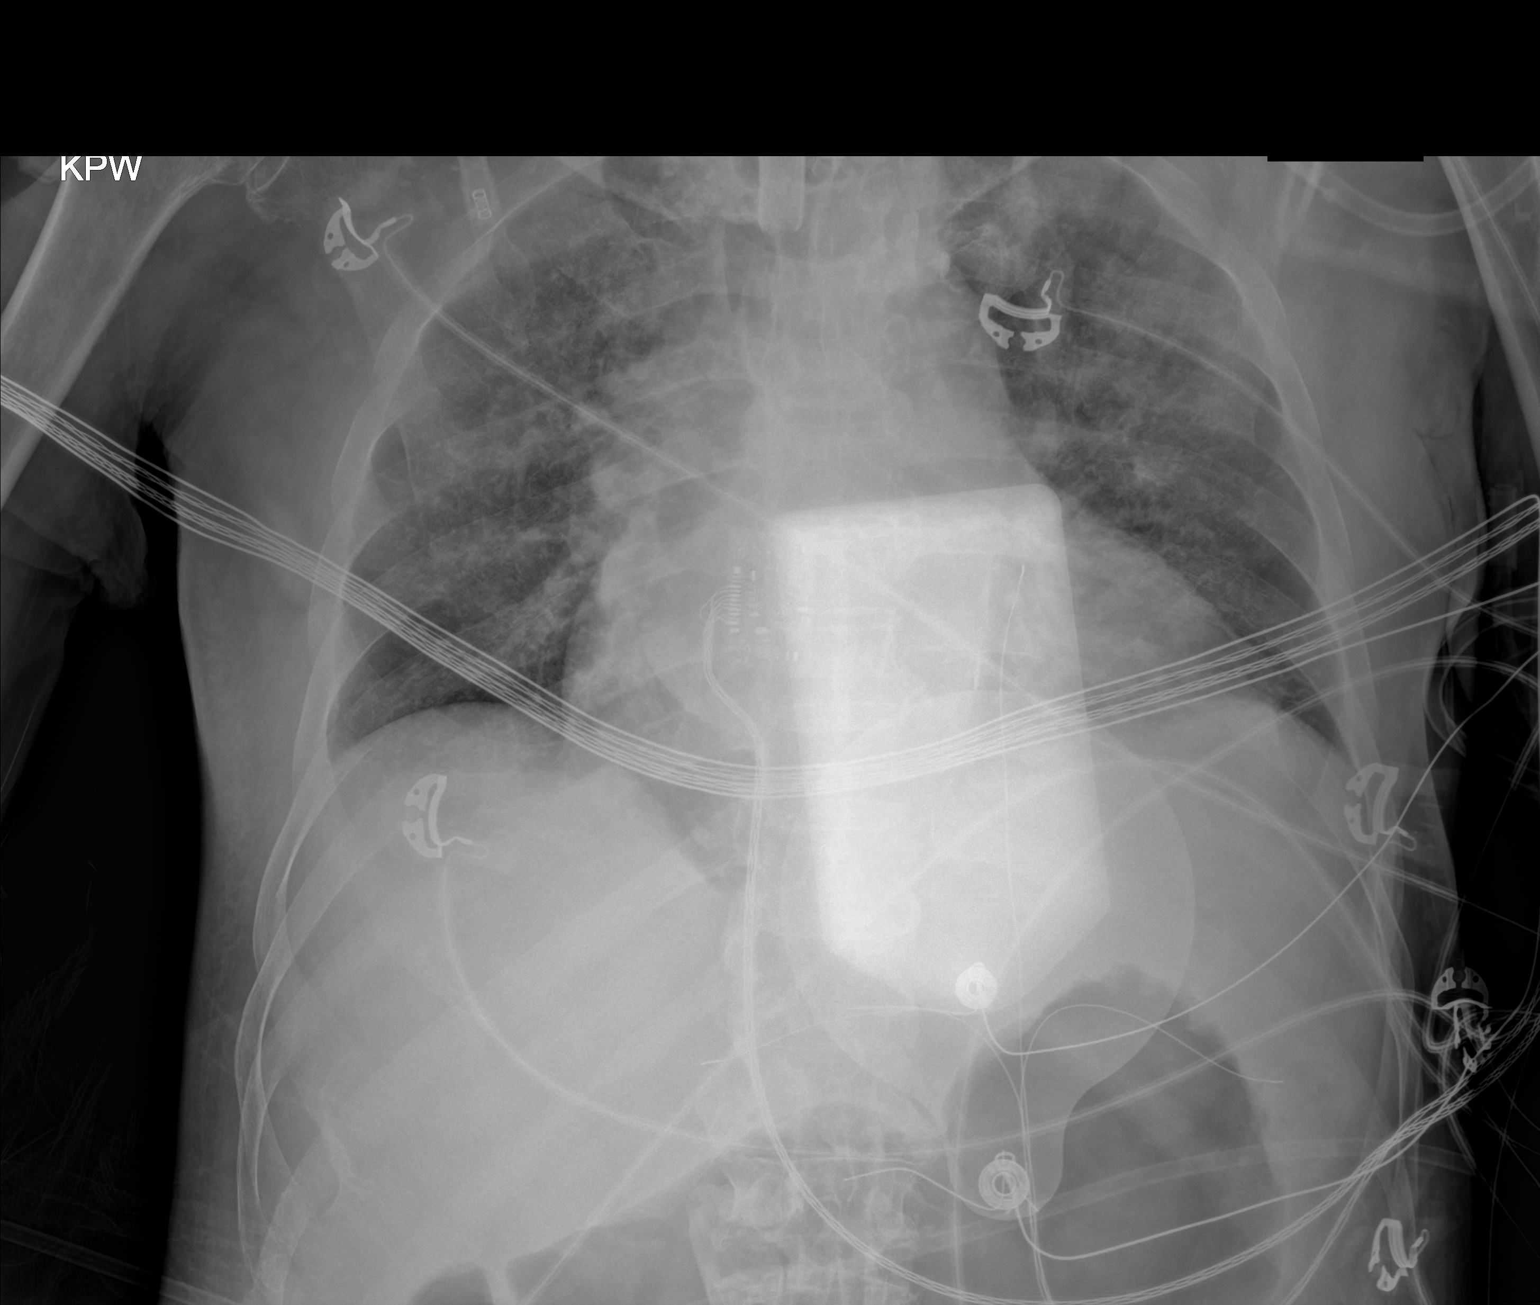

[1 of 1 positions shown; findings below may reference images not displayed]

FINDINGS: Endotracheal tube in good position 4.7 cm above the carina. Heart
size is normal. Slight bilateral haziness, left greater than right,
likely represents mild edema. No consolidative infiltrates or
effusions. No bone abnormality.
IMPRESSION: 1. Endotracheal tube in good position.
2. Mild pulmonary edema.

## 2021-09-06 IMAGING — DX DG CHEST 1V PORT
1 series · 1 of 1 positions shown · non-contrast
Comparison: 11/28/2019

CLINICAL DATA: Acute respiratory failure

EXAM:
PORTABLE CHEST 1 VIEW

[chest ap]
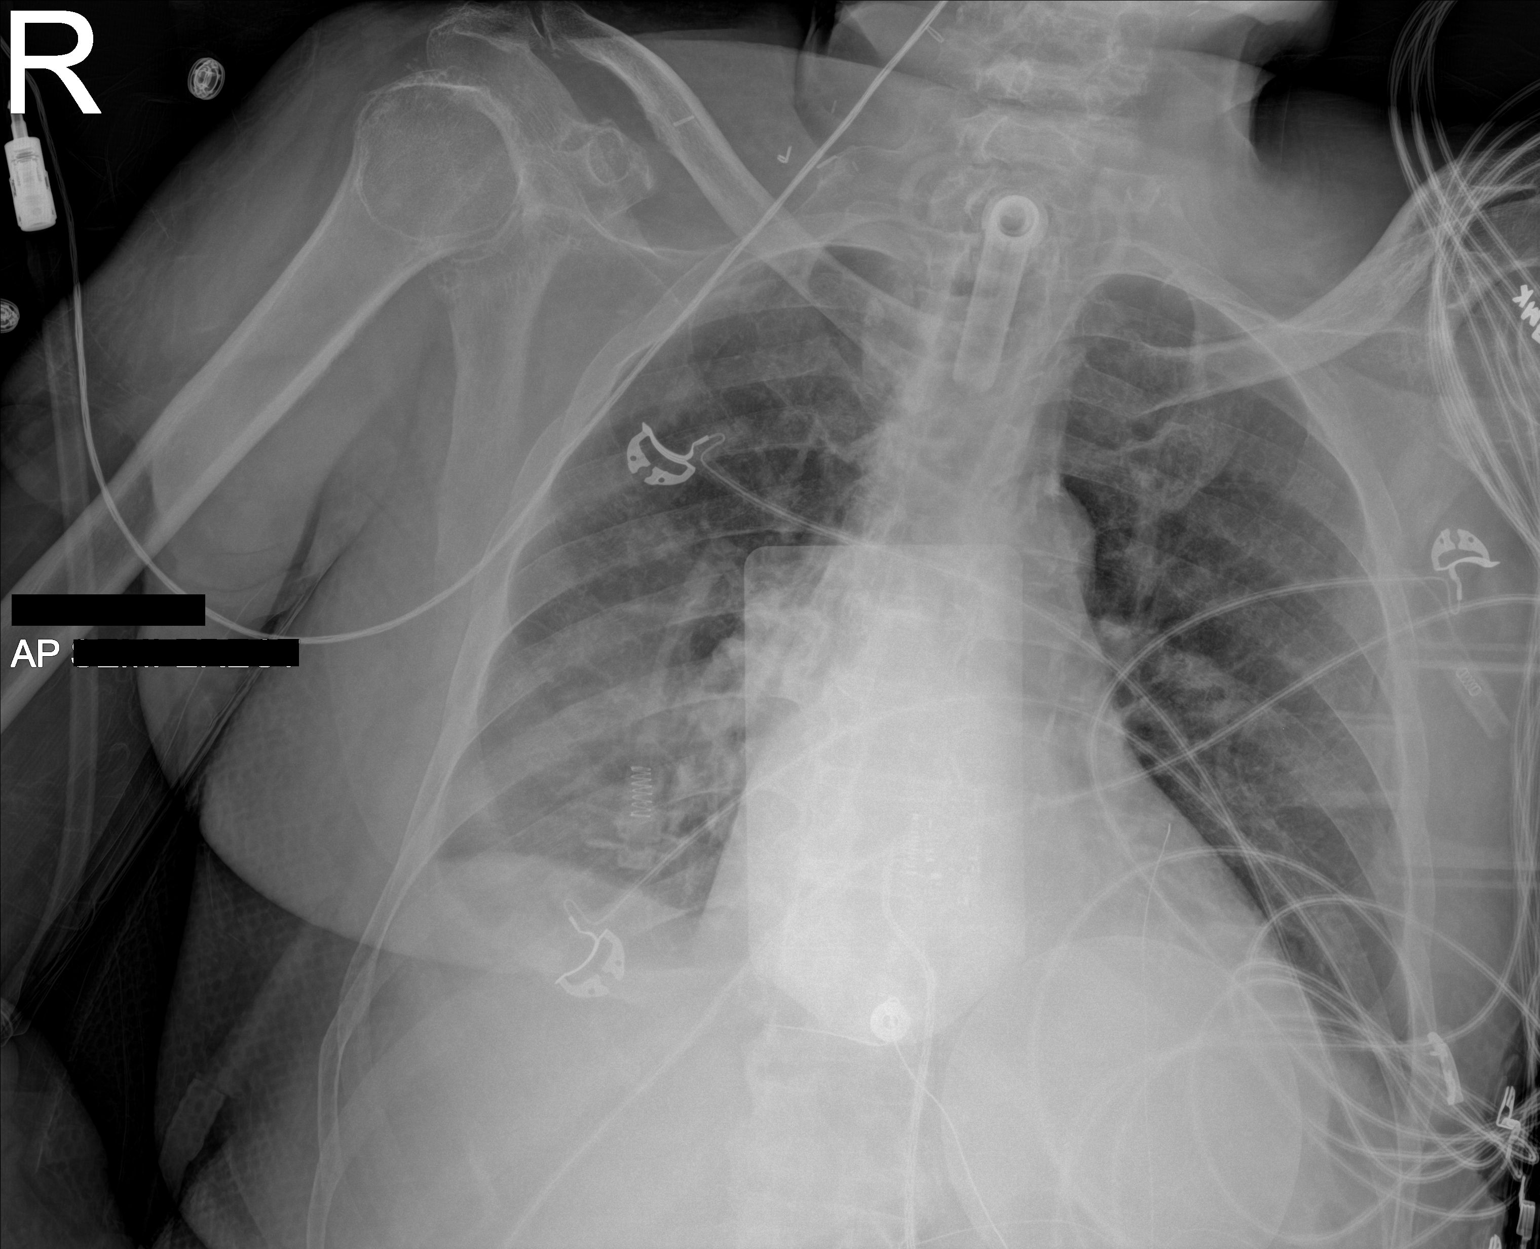

[1 of 1 positions shown; findings below may reference images not displayed]

FINDINGS: Single frontal view of the chest demonstrates tracheostomy tube
overlying tracheal air column tip at thoracic inlet. External
defibrillator pads are again noted. Cardiac silhouette is
unremarkable. Since the prior exam, there is decreased interstitial
prominence. Persistent central vascular congestion with trace right
pleural effusion. No pneumothorax.
IMPRESSION: 1. Persistent central vascular congestion and trace right pleural
effusion, with improving interstitial edema since prior exam.
